# Patient Record
Sex: Female | Born: 1970
Health system: Southern US, Community
[De-identification: ages and names within clinical notes are randomized; demographics above are authoritative.]

## PROBLEM LIST (undated history)

## (undated) DIAGNOSIS — C801 Malignant (primary) neoplasm, unspecified: Secondary | ICD-10-CM

## (undated) DIAGNOSIS — D0502 Lobular carcinoma in situ of left breast: Secondary | ICD-10-CM

## (undated) DIAGNOSIS — Z9889 Other specified postprocedural states: Secondary | ICD-10-CM

## (undated) DIAGNOSIS — Z87442 Personal history of urinary calculi: Secondary | ICD-10-CM

## (undated) DIAGNOSIS — T7840XA Allergy, unspecified, initial encounter: Secondary | ICD-10-CM

## (undated) DIAGNOSIS — N200 Calculus of kidney: Secondary | ICD-10-CM

## (undated) DIAGNOSIS — R112 Nausea with vomiting, unspecified: Secondary | ICD-10-CM

## (undated) HISTORY — DX: Allergy, unspecified, initial encounter: T78.40XA

## (undated) HISTORY — PX: ABDOMINAL HYSTERECTOMY: SHX81

## (undated) HISTORY — PX: CERVICAL BIOPSY  W/ LOOP ELECTRODE EXCISION: SUR135

---

## 2000-12-25 ENCOUNTER — Inpatient Hospital Stay (HOSPITAL_COMMUNITY): Admission: AD | Admit: 2000-12-25 | Discharge: 2000-12-28 | Payer: Self-pay | Admitting: Obstetrics

## 2000-12-26 ENCOUNTER — Encounter: Payer: Self-pay | Admitting: Obstetrics and Gynecology

## 2002-04-27 ENCOUNTER — Emergency Department (HOSPITAL_COMMUNITY): Admission: EM | Admit: 2002-04-27 | Discharge: 2002-04-27 | Payer: Self-pay | Admitting: Emergency Medicine

## 2013-01-12 ENCOUNTER — Ambulatory Visit: Payer: Self-pay

## 2013-01-25 ENCOUNTER — Ambulatory Visit: Payer: Self-pay | Attending: Internal Medicine

## 2013-02-23 ENCOUNTER — Ambulatory Visit: Payer: No Typology Code available for payment source | Attending: Internal Medicine | Admitting: Internal Medicine

## 2013-02-23 VITALS — BP 100/62 | HR 85 | Temp 99.3°F | Resp 16 | Ht 58.27 in | Wt 110.0 lb

## 2013-02-23 DIAGNOSIS — Z0000 Encounter for general adult medical examination without abnormal findings: Secondary | ICD-10-CM

## 2013-02-23 DIAGNOSIS — Z09 Encounter for follow-up examination after completed treatment for conditions other than malignant neoplasm: Secondary | ICD-10-CM | POA: Insufficient documentation

## 2013-02-23 DIAGNOSIS — Z719 Counseling, unspecified: Secondary | ICD-10-CM | POA: Insufficient documentation

## 2013-02-23 NOTE — Progress Notes (Signed)
Patient ID: Crystal [name], female   DOB: [birth year], 42 y.o.   MRN: [id number] Patient Demographics  Crystal [name], is a 42 y.o. female  [id number]  [id number]  DOB - [birth year]  Chief Complaint  Patient presents with  . Establish Care        Subjective:   Crystal [name] today is here to establish primary care. Patient has No headache, No chest pain, No abdominal pain - No Nausea, No new weakness tingling or numbness, No Cough - SOB.  Patient has no medical problems, states that she is only here to get the Depo shot for contraception, last was due on September 19 but she missed her appointment  Objective:    Filed Vitals:   02/23/13 1647  BP: 100/62  Pulse: 85  Temp: 99.3 F (37.4 C)  TempSrc: Oral  Resp: 16  Height: 4' 10.27" (1.48 m)  Weight: 110 lb (49.896 kg)  SpO2: 98%     ALLERGIES:  No Known Allergies  PAST MEDICAL HISTORY: History reviewed. No pertinent past medical history.  PAST SURGICAL HISTORY: Past Surgical History  Procedure Laterality Date  . Cesarean section      FAMILY HISTORY: History reviewed. No pertinent family history.  MEDICATIONS AT HOME: Prior to Admission medications   Not on File    REVIEW OF SYSTEMS:  Constitutional:   No   Fevers, chills, fatigue.  HEENT:    No headaches, Sore throat,   Cardio-vascular: No chest pain,  Orthopnea, swelling in lower extremities, anasarca, palpitations  GI:  No abdominal pain, nausea, vomiting, diarrhea  Resp: No shortness of breath,  No coughing up of blood.No cough.No wheezing.  Skin:  no rash or lesions.  GU:  no dysuria, change in color of urine, no urgency or frequency.  No flank pain.  Musculoskeletal: No joint pain or swelling.  No decreased range of motion.  No back pain.  Psych: No change in mood or affect. No depression or anxiety.  No memory loss.   Exam  General appearance :Awake, alert, NAD, Speech Clear. HEENT: Atraumatic and  Normocephalic, PERLA Neck: supple, no JVD. No cervical lymphadenopathy.  Chest: clear to auscultation bilaterally, no wheezing, rales or rhonchi CVS: S1 S2 regular, no murmurs.  Abdomen: soft, NBS, NT, ND, no gaurding, rigidity or rebound. Extremities: No cyanosis, clubbing, B/L Lower Ext shows no edema,  Neurology: Awake alert, and oriented X 3, CN II-XII intact, Non focal Skin:No Rash or lesions Wounds: N/A    Data Review   Basic Metabolic Panel: No results found for this basename: NA, K, CL, CO2, GLUCOSE, BUN, CREATININE, CALCIUM, MG, PHOS,  in the last 168 hours Liver Function Tests: No results found for this basename: AST, ALT, ALKPHOS, BILITOT, PROT, ALBUMIN,  in the last 168 hours  CBC: No results found for this basename: WBC, NEUTROABS, HGB, HCT, MCV, PLT,  in the last 168 hours ------------------------------------------------------------------------------------------------------------------ No results found for this basename: HGBA1C,  in the last 72 hours ------------------------------------------------------------------------------------------------------------------ No results found for this basename: CHOL, HDL, LDLCALC, TRIG, CHOLHDL, LDLDIRECT,  in the last 72 hours ------------------------------------------------------------------------------------------------------------------ No results found for this basename: TSH, T4TOTAL, FREET3, T3FREE, THYROIDAB,  in the last 72 hours ------------------------------------------------------------------------------------------------------------------ No results found for this basename: VITAMINB12, FOLATE, FERRITIN, TIBC, IRON, RETICCTPCT,  in the last 72 hours  Coagulation profile  No results found for this basename: INR, PROTIME,  in the last 168 hours    Assessment & Plan   Active Problems: Contraception - Unfortunately unable to give the  Depo shot today, as staff member is not available. Requested patient to make  appointment next week and call on Monday.   Patient declined flu shot  Follow-up in one week for the Depo injection   Crystal [name] M.D. 02/23/2013, 5:11 PM

## 2013-02-23 NOTE — Progress Notes (Signed)
Pt is here to est care Voices no concerns.  Alert w/no signs of acute distress.

## 2013-03-02 ENCOUNTER — Ambulatory Visit: Payer: No Typology Code available for payment source | Attending: Internal Medicine

## 2013-03-02 VITALS — Temp 97.8°F

## 2013-03-02 DIAGNOSIS — Z0000 Encounter for general adult medical examination without abnormal findings: Secondary | ICD-10-CM

## 2013-03-02 MED ORDER — MEDROXYPROGESTERONE ACETATE 150 MG/ML IM SUSP
150.0000 mg | Freq: Once | INTRAMUSCULAR | Status: AC
  Administered 2013-03-02: 150 mg via INTRAMUSCULAR

## 2013-03-02 NOTE — Progress Notes (Unsigned)
Patient here for depo provera injection POCT pregnancy test- negative

## 2013-05-25 ENCOUNTER — Ambulatory Visit: Payer: No Typology Code available for payment source

## 2013-06-01 ENCOUNTER — Ambulatory Visit: Payer: No Typology Code available for payment source

## 2013-12-10 ENCOUNTER — Ambulatory Visit: Payer: Self-pay

## 2014-03-20 ENCOUNTER — Telehealth: Payer: Self-pay | Admitting: Pediatrics

## 2014-03-20 DIAGNOSIS — Z2089 Contact with and (suspected) exposure to other communicable diseases: Secondary | ICD-10-CM

## 2014-03-20 DIAGNOSIS — Z207 Contact with and (suspected) exposure to pediculosis, acariasis and other infestations: Secondary | ICD-10-CM

## 2014-03-20 MED ORDER — PERMETHRIN 5 % EX CREA: 1.0000 "application " | TOPICAL_CREAM | Freq: Once | CUTANEOUS | Status: DC

## 2014-03-20 NOTE — Telephone Encounter (Signed)
Crystal [name]'s daughter [name]) was seen in clinic today and diagnosed with scabies.  The entire household is being treated with Permethrin cream.  Rx sent to [facility] and [place].

## 2014-10-07 ENCOUNTER — Ambulatory Visit: Payer: Self-pay

## 2015-02-11 ENCOUNTER — Ambulatory Visit: Payer: Self-pay | Attending: Internal Medicine

## 2015-02-28 ENCOUNTER — Ambulatory Visit: Payer: Self-pay | Attending: Internal Medicine | Admitting: Internal Medicine

## 2015-02-28 ENCOUNTER — Encounter: Payer: Self-pay | Admitting: Internal Medicine

## 2015-02-28 VITALS — BP 103/62 | HR 86 | Temp 98.2°F | Resp 16 | Ht <= 58 in | Wt 110.0 lb

## 2015-02-28 DIAGNOSIS — M25559 Pain in unspecified hip: Secondary | ICD-10-CM | POA: Insufficient documentation

## 2015-02-28 DIAGNOSIS — R1031 Right lower quadrant pain: Secondary | ICD-10-CM | POA: Insufficient documentation

## 2015-02-28 DIAGNOSIS — Z87442 Personal history of urinary calculi: Secondary | ICD-10-CM | POA: Insufficient documentation

## 2015-02-28 DIAGNOSIS — R109 Unspecified abdominal pain: Secondary | ICD-10-CM

## 2015-02-28 DIAGNOSIS — H04123 Dry eye syndrome of bilateral lacrimal glands: Secondary | ICD-10-CM | POA: Insufficient documentation

## 2015-02-28 DIAGNOSIS — K5900 Constipation, unspecified: Secondary | ICD-10-CM | POA: Insufficient documentation

## 2015-02-28 LAB — POCT URINALYSIS DIPSTICK
BILIRUBIN UA: NEGATIVE
GLUCOSE UA: NEGATIVE
KETONES UA: NEGATIVE
Leukocytes, UA: NEGATIVE
Nitrite, UA: NEGATIVE
Protein, UA: NEGATIVE
Spec Grav, UA: 1.025
Urobilinogen, UA: 0.2
pH, UA: 6

## 2015-02-28 MED ORDER — NAPROXEN 500 MG PO TABS: 500.0000 mg | ORAL_TABLET | Freq: Two times a day (BID) | ORAL | Status: DC

## 2015-02-28 MED ORDER — POLYETHYLENE GLYCOL 3350 17 GM/SCOOP PO POWD: 17.0000 g | Freq: Every day | ORAL | Status: DC

## 2015-02-28 NOTE — Progress Notes (Signed)
Patient ID: Crystal [name], female   DOB: [birth year], 45 y.o.   MRN: [id number]  CC: side pain  HPI: Crystal [name] is a 44 y.o. female here today for a follow up visit.  Patient has no past medical history. Patient presents today with complaints of right side pain described as a sharp pain that radiates to her lower abdomen. She denies flank pain, dysuria, nausea, vomiting, or menstrual cramps. She reports that she has some constipation and may go several days without having a bowel movement. She reports soft stools although she has constipation. She has not tried anything for pain. She notes that she has had kidney stones in the past but this is not the same pain. Pain not aggravated by anything.  Red eyes in the morning that burn. Eyes are so irritated at times it causes her to pull over from driving. This has been a problem for over 6 months. She has tried using visine allergy eye drops.   No Known Allergies History reviewed. No pertinent past medical history. Current Outpatient Prescriptions on File Prior to Visit  Medication Sig Dispense Refill  . permethrin (ACTICIN) 5 % cream Apply 1 application topically once. (Patient not taking: Reported on 02/28/2015) 60 g 1   No current facility-administered medications on file prior to visit.   History reviewed. No pertinent family history. Social History   Social History  . Marital Status: Widowed    Spouse Name: N/A  . Number of Children: N/A  . Years of Education: N/A   Occupational History  . Not on file.   Social History Main Topics  . Smoking status: Never Smoker   . Smokeless tobacco: Not on file  . Alcohol Use: Yes     Comment: occasionally  . Drug Use: No  . Sexual Activity: Not on file   Other Topics Concern  . Not on file   Social History Narrative    Review of Systems: Other than what is stated in HPI, all other systems are negative.   Objective:   Filed Vitals:   02/28/15 1641  BP: 103/62  Pulse:  86  Temp: 98.2 F (36.8 C)  Resp: 16    Physical Exam  Constitutional: She is oriented to person, place, and time.  Eyes: EOM are normal. Pupils are equal, round, and reactive to light.  Cardiovascular: Normal rate, regular rhythm and normal heart sounds.   Pulmonary/Chest: Effort normal and breath sounds normal.  Abdominal: Soft. Bowel sounds are normal. She exhibits no distension. There is tenderness (right upper/lower quadrant).  No CVA tenderness  Musculoskeletal: She exhibits no edema.  Neurological: She is alert and oriented to person, place, and time.  Skin: Skin is warm and dry.  Psychiatric: She has a normal mood and affect.     No results found for: WBC, HGB, HCT, MCV, PLT No results found for: CREATININE, BUN, NA, K, CL, CO2  No results found for: HGBA1C Lipid Panel  No results found for: CHOL, TRIG, HDL, CHOLHDL, VLDL, LDLCALC     Assessment and plan:   Crystal [name] was seen today for hip pain.  Diagnoses and all orders for this visit:  Flank pain -     POCT urinalysis dipstick -     Begin naproxen (NAPROSYN) 500 MG tablet; Take 1 tablet (500 mg total) by mouth 2 (two) times daily with a meal.  Constipation, unspecified constipation type -     polyethylene glycol powder (GLYCOLAX/MIRALAX) powder; Take 17 g by mouth daily. Explained that  the daily recommended amount of fiber is 25 g, went over high fiber foods, encourage increased water intake, miralax use, and increased physical activity.  Patient given constipation handout.   Dry eyes, bilateral Patient will switch to Systane eye drops and use 2-3 times daily to see if she has any improvement. If no improvement patient may require a optometry visit for further management.  Due to language barrier, an interpreter was present during the history-taking and subsequent discussion (and for part of the physical exam) with this patient.  Return if symptoms worsen or fail to improve.       Lance Bosch,  [facility] [phone] 02/28/2015, 4:52 PM

## 2015-02-28 NOTE — Progress Notes (Signed)
C/C Rit hip pain, radiating to low abdominal area  No burning with urination complaining of constipation  Pain scale #0. Pain worsen at night Stated eyes are red in the morning.

## 2015-03-10 ENCOUNTER — Encounter (HOSPITAL_COMMUNITY): Payer: Self-pay | Admitting: *Deleted

## 2015-03-20 ENCOUNTER — Other Ambulatory Visit: Payer: Self-pay | Admitting: Obstetrics and Gynecology

## 2015-03-20 ENCOUNTER — Ambulatory Visit (HOSPITAL_COMMUNITY)
Admission: RE | Admit: 2015-03-20 | Discharge: 2015-03-20 | Disposition: A | Discharge status: Home / Self Care | Payer: Self-pay | Source: Ambulatory Visit | Attending: Obstetrics and Gynecology | Admitting: Obstetrics and Gynecology

## 2015-03-20 ENCOUNTER — Encounter (HOSPITAL_COMMUNITY): Payer: Self-pay

## 2015-03-20 VITALS — BP 100/58 | Temp 98.1°F | Ht 60.0 in | Wt 110.0 lb

## 2015-03-20 DIAGNOSIS — Z1231 Encounter for screening mammogram for malignant neoplasm of breast: Secondary | ICD-10-CM

## 2015-03-20 DIAGNOSIS — R87613 High grade squamous intraepithelial lesion on cytologic smear of cervix (HGSIL): Secondary | ICD-10-CM

## 2015-03-20 DIAGNOSIS — Z1239 Encounter for other screening for malignant neoplasm of breast: Secondary | ICD-10-CM

## 2015-03-20 HISTORY — DX: Calculus of kidney: N20.0

## 2015-03-20 NOTE — Progress Notes (Signed)
Patient referred to [place] by the [facility] due to needing a colposcopy to follow-up for abnormal Pap smear on 02/26/2015.  Pap Smear:  Pap smear not completed today. Last Pap smear was 02/26/2015 at the [facility] Department and HGSIL.Referred patient to the [facility] for a colposcopy to follow up for abnormal Pap smear. Appointment scheduled for Wednesday, April 02, 2015 at 1415. Per patient has no history of abnormal Pap smears prior to the most recent Pap smear. Pap smear result is scanned in EPIC under media.  Physical exam: Breasts Breasts symmetrical. No skin abnormalities bilateral breasts. No nipple retraction bilateral breasts. No nipple discharge bilateral breasts. No lymphadenopathy. No lumps palpated bilateral breasts. No complaints of pain or tenderness on exam. Referred patient to the [place] for screening mammogram. Appointment scheduled for Tuesday, April 08, 2015 at [place].       Pelvic/Bimanual No Pap smear completed today since last Pap smear was 02/26/2015. Pap smear not indicated per BCCCP guidelines.   Used interpreter Benjamine Sprague.

## 2015-03-20 NOTE — Patient Instructions (Signed)
Educational materials on self breast awareness given. Explained the colposcopy to [name] that is needed for follow-up of abnormal Pap smear on 02/26/2015. Referred patient to the [facility] for a colposcopy to follow up for abnormal Pap smear. Appointment scheduled for Wednesday, April 02, 2015 at 1415. Referred patient to the [facility] for screening mammogram. Appointment scheduled for Tuesday, April 08, 2015 at [place]. Patient aware of appointments and will be there. Let patient know the [facility] will follow up with her within the next couple weeks after appointment with results by letter or phone. Crystal [name] verbalized understanding.  Crystal [name], Arvil Chaco, RN 1:40 PM

## 2015-04-02 ENCOUNTER — Ambulatory Visit (INDEPENDENT_AMBULATORY_CARE_PROVIDER_SITE_OTHER): Payer: Self-pay | Admitting: Obstetrics & Gynecology

## 2015-04-02 ENCOUNTER — Other Ambulatory Visit (HOSPITAL_COMMUNITY)
Admission: RE | Admit: 2015-04-02 | Discharge: 2015-04-02 | Disposition: A | Discharge status: Home / Self Care | Payer: Self-pay | Source: Ambulatory Visit | Attending: Obstetrics & Gynecology | Admitting: Obstetrics & Gynecology

## 2015-04-02 ENCOUNTER — Encounter: Payer: Self-pay | Admitting: Obstetrics & Gynecology

## 2015-04-02 VITALS — BP 134/66 | HR 72 | Temp 98.2°F | Wt 106.1 lb

## 2015-04-02 DIAGNOSIS — Z3202 Encounter for pregnancy test, result negative: Secondary | ICD-10-CM

## 2015-04-02 DIAGNOSIS — Z01812 Encounter for preprocedural laboratory examination: Secondary | ICD-10-CM

## 2015-04-02 DIAGNOSIS — R87613 High grade squamous intraepithelial lesion on cytologic smear of cervix (HGSIL): Secondary | ICD-10-CM

## 2015-04-02 LAB — POCT PREGNANCY, URINE: PREG TEST UR: NEGATIVE

## 2015-04-02 NOTE — Progress Notes (Signed)
   Subjective:    Patient ID: Crystal [name], female    DOB: 1970-10-08, 44 y.o.   MRN: [id number]  HPI 44 yo H lady referred from [place] for colpo due to a HGSIL pap.   Review of Systems     Objective:   Physical Exam  WNWHHFNAD Breathing, conversing (with interpretor), and ambulating normally  UPT negative, consent signed, time out done Cervix prepped with acetic acid. Transformation zone seen in its entirety. Colpo adequate. 3 small blood vessels, 1, 3, and 5 o'clock position but not really abnormal with green filter I removed the one at the 1 o'clock position entirely Silver nitrate yielded hemostasis ECC obtained. She tolerated the procedure well.        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

## 2015-04-03 ENCOUNTER — Ambulatory Visit: Payer: Self-pay | Attending: Internal Medicine | Admitting: Internal Medicine

## 2015-04-03 ENCOUNTER — Encounter: Payer: Self-pay | Admitting: Internal Medicine

## 2015-04-03 VITALS — BP 106/65 | HR 67 | Temp 98.0°F | Resp 16 | Ht 60.0 in | Wt 109.6 lb

## 2015-04-03 DIAGNOSIS — R319 Hematuria, unspecified: Secondary | ICD-10-CM

## 2015-04-03 DIAGNOSIS — M545 Low back pain, unspecified: Secondary | ICD-10-CM

## 2015-04-03 LAB — POCT URINALYSIS DIPSTICK
BILIRUBIN UA: NEGATIVE
GLUCOSE UA: NEGATIVE
Ketones, UA: NEGATIVE
LEUKOCYTES UA: NEGATIVE
NITRITE UA: NEGATIVE
Protein, UA: NEGATIVE
Spec Grav, UA: 1.01
Urobilinogen, UA: 0.2
pH, UA: 6

## 2015-04-03 MED ORDER — TRAMADOL HCL 50 MG PO TABS: 50.0000 mg | ORAL_TABLET | Freq: Three times a day (TID) | ORAL | Status: DC | PRN

## 2015-04-03 NOTE — Progress Notes (Signed)
   Subjective:    Patient ID: Crystal [name], female    DOB: 09/28/1970, 44 y.o.   MRN: [id number]  Back Pain This is a recurrent problem. The current episode started more than 1 month ago. The problem occurs 2 to 4 times per day. The problem is unchanged. The pain is present in the lumbar spine. The quality of the pain is described as stabbing. The pain radiates to the right thigh. The pain is moderate. The pain is the same all the time. Associated symptoms include abdominal pain. Pertinent negatives include no bladder incontinence, bowel incontinence, dysuria, numbness, pelvic pain or weakness. (Daily soft bowel movements) Risk factors include sedentary lifestyle. She has tried NSAIDs for the symptoms. The treatment provided no relief.    Review of Systems  Gastrointestinal: Positive for abdominal pain. Negative for bowel incontinence.  Genitourinary: Negative for bladder incontinence, dysuria and pelvic pain.  Musculoskeletal: Positive for back pain.  Neurological: Negative for weakness and numbness.  All other systems reviewed and are negative.     Objective:   Physical Exam  Constitutional: She is oriented to person, place, and time.  Cardiovascular: Normal rate, regular rhythm and normal heart sounds.   Pulmonary/Chest: Effort normal and breath sounds normal. She has no wheezes.  Abdominal: Soft. Bowel sounds are normal. There is no tenderness.  Right flank tenderness  Neurological: She is alert and oriented to person, place, and time.  Skin: Skin is warm and dry.  Psychiatric: She has a normal mood and affect.      Assessment & Plan:  Crystal [name] was seen today for back pain.  Diagnoses and all orders for this visit:  Right-sided low back pain without sciatica -     POCT urinalysis dipstick -     US Renal; Future---r/o kidney stones -     traMADol (ULTRAM) 50 MG tablet; Take 1 tablet (50 mg total) by mouth every 8 (eight) hours as needed.  Hematuria See above. Drink  plenty of fluids  Return if symptoms worsen or fail to improve.  Lance Bosch, NP 04/03/2015 12:06 PM

## 2015-04-03 NOTE — Progress Notes (Signed)
Interpreter line used Crystal [name] ID# [id number] Patient complains of lower right sided back pain that radiates down  Her right leg to her knee Patient was prescribed naproxen but states it is not helping

## 2015-04-04 ENCOUNTER — Other Ambulatory Visit: Payer: Self-pay

## 2015-04-04 ENCOUNTER — Ambulatory Visit (HOSPITAL_BASED_OUTPATIENT_CLINIC_OR_DEPARTMENT_OTHER): Payer: Self-pay

## 2015-04-04 VITALS — BP 102/68 | HR 62 | Temp 98.3°F | Resp 14 | Ht 58.5 in | Wt 107.3 lb

## 2015-04-04 DIAGNOSIS — Z0000 Encounter for general adult medical examination without abnormal findings: Secondary | ICD-10-CM

## 2015-04-04 LAB — LIPID PANEL
CHOLESTEROL: 193 mg/dL (ref 125–200)
HDL: 52 mg/dL (ref >=46)
LDL Cholesterol: 125 mg/dL (ref <130)
Total CHOL/HDL Ratio: 3.7 Ratio (ref <=5.0)
Triglycerides: 78 mg/dL (ref <150)
VLDL: 16 mg/dL (ref <30)

## 2015-04-04 LAB — HEMOGLOBIN A1C
Hgb A1c MFr Bld: 5.5 % (ref <5.7)
MEAN PLASMA GLUCOSE: 111 mg/dL (ref <117)

## 2015-04-04 LAB — GLUCOSE (CC13): Glucose: 87 mg/dl (ref 70–140)

## 2015-04-04 NOTE — Patient Instructions (Signed)
Discussed health assessment with patient.She will be called with results of lab work and we will then discussed any further follow up the patient needs. Patient verbalized understanding. 

## 2015-04-04 NOTE — Progress Notes (Signed)
Patient is a new patient to the [facility] program and is currently a BCCCP patient effective 03/20/2015 with interpreter Crystal [name] .   Clinical Measurements: Patient is 4 ft. 10 1/2 inches, weight 107.3 lbs, BMI 22.1 .   Medical History: Patient has no history of high cholesterol. Patient does not have a history of hypertension or diabetes. Per patient no diagnosed history of coronary heart disease, heart attack, heart failure, stroke/TIA, vascular disease or congenital heart defects.   Blood Pressure, Self-measurement: Patient states has no reason to check Blood pressure.  Nutrition Assessment: Patient stated that eats 1 fruit every day. Patient states she eats 1 servings of vegetables a day. Per patient states does eat 3 or more ounces of whole grains daily. Patient stated doesn't eat two or more servings of fish weekly. Patient states she does not  drink more than 36 ounces or 450 calories of beverages with added sugars weekly. Patient stated she does watch her salt intake. Marland Kitchen  Physical Activity Assessment: Patient stated that cleans 150 minutes of moderate exercise a week and rarely does any vigorous exercise.  Smoking Status: Patient has never smoked and is not exposed to smoke. Though patient smells smoke in her bathroom. We discussed air filters and where to get them..  Quality of Life Assessment: In assessing patient's quality of life she stated that out of the past 30 days that she has felt her health is good all of them. Patient also stated that in the past 30 days that her mental health was not good including stress, depression and problems with emotions for 21 days. Patient did state that out of the past 30 days she felt her physical or mental health had not kept her from doing her usual activities including self-care, work or recreation.   Plan: Lab work will be done today including a lipid panel, blood glucose, and Hgb A1C. Will call lab results when they are finished. Will  discuss risk reduction counseling when call results.

## 2015-04-07 ENCOUNTER — Ambulatory Visit (HOSPITAL_COMMUNITY)
Admission: RE | Admit: 2015-04-07 | Discharge: 2015-04-07 | Disposition: A | Discharge status: Home / Self Care | Payer: Self-pay | Source: Ambulatory Visit | Attending: Internal Medicine | Admitting: Internal Medicine

## 2015-04-07 DIAGNOSIS — R319 Hematuria, unspecified: Secondary | ICD-10-CM | POA: Insufficient documentation

## 2015-04-07 DIAGNOSIS — M545 Low back pain, unspecified: Secondary | ICD-10-CM

## 2015-04-07 DIAGNOSIS — N281 Cyst of kidney, acquired: Secondary | ICD-10-CM | POA: Insufficient documentation

## 2015-04-08 ENCOUNTER — Ambulatory Visit
Admission: RE | Admit: 2015-04-08 | Discharge: 2015-04-08 | Disposition: A | Discharge status: Home / Self Care | Payer: No Typology Code available for payment source | Source: Ambulatory Visit | Attending: Obstetrics and Gynecology | Admitting: Obstetrics and Gynecology

## 2015-04-08 ENCOUNTER — Telehealth: Payer: Self-pay

## 2015-04-08 DIAGNOSIS — Z1231 Encounter for screening mammogram for malignant neoplasm of breast: Secondary | ICD-10-CM

## 2015-04-08 NOTE — Telephone Encounter (Signed)
Called to inform about lab work from 04/04/15. Interpreter, Lavon Paganini informed patient: cholesterol- 193, HDL- 52, LDL- 125, triglycerides - 78, Bld Glucose -87 and HBG-A1C - 5.5. Did risk reduction counseling concerning watching type of fats use and exercise. No follow up needed at this time.

## 2015-04-14 ENCOUNTER — Encounter: Payer: Self-pay | Admitting: Obstetrics and Gynecology

## 2015-04-14 ENCOUNTER — Other Ambulatory Visit: Payer: Self-pay | Admitting: Internal Medicine

## 2015-04-14 ENCOUNTER — Ambulatory Visit (INDEPENDENT_AMBULATORY_CARE_PROVIDER_SITE_OTHER): Payer: Self-pay | Admitting: Obstetrics and Gynecology

## 2015-04-14 VITALS — BP 116/68 | HR 78 | Temp 97.9°F | Wt 109.9 lb

## 2015-04-14 DIAGNOSIS — N2889 Other specified disorders of kidney and ureter: Secondary | ICD-10-CM | POA: Insufficient documentation

## 2015-04-14 DIAGNOSIS — C539 Malignant neoplasm of cervix uteri, unspecified: Secondary | ICD-10-CM | POA: Insufficient documentation

## 2015-04-14 DIAGNOSIS — D069 Carcinoma in situ of cervix, unspecified: Secondary | ICD-10-CM

## 2015-04-14 NOTE — Progress Notes (Signed)
CLINIC ENCOUNTER NOTE  History:  44 y.o. [id number] here today for results f/u.  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   Past Medical History  Diagnosis Date  . Kidney stones     Past Surgical History  Procedure Laterality Date  . Cesarean section      2 previous    The following portions of the patient's history were reviewed and updated as appropriate: allergies, current medications, past family history, past medical history, past social history, past surgical history and problem list.    Review of Systems:  See above; comprehensive review of systems was otherwise negative.  Objective:  Physical Exam BP 116/68 mmHg  Pulse 78  Temp(Src) 97.9 F (36.6 C) (Oral)  Wt 109 lb 14.4 oz (49.85 kg)  LMP 03/11/2015 (Exact Date) CONSTITUTIONAL: Well-developed, well-nourished female in no acute distress.  HENT:  Normocephalic, atraumatic SKIN: Skin is warm and dry.  [place]: Alert  PSYCHIATRIC: Normal mood and affect.   Labs and Imaging US Renal  04/07/2015  CLINICAL DATA:  Hematuria for 1 month.  History of nephrolithiasis. EXAM: RENAL / URINARY TRACT ULTRASOUND COMPLETE COMPARISON:  None. FINDINGS: Right Kidney: Length: 10.3 cm. Normal right renal parenchymal echogenicity. No right hydronephrosis. There is a 1.1 x 1.0 x 1.1 cm renal cyst in the upper right kidney with 4 mm echogenic nodular mural focus. No right renal stones large enough to cause acoustic shadowing. Left Kidney: Length: 10.7 cm. Echogenicity within normal limits. No mass or hydronephrosis visualized. No left renal stones large enough to cause acoustic shadowing. Bladder: Appears normal for degree of bladder distention. IMPRESSION: 1. No hydronephrosis. No renal stones large enough to cause acoustic shadowing. 2. Complex 1.1 cm upper right renal cyst, which is indeterminate. Recommend further evaluation with renal mass protocol MRI or CT of the abdomen with and without intravenous contrast. 3. Normal bladder. Electronically  Signed   By: Ilona Sorrel M.D.   On: 04/07/2015 08:21   Ms Digital Screening Bilateral  04/09/2015  CLINICAL DATA:  Screening. EXAM: DIGITAL SCREENING BILATERAL MAMMOGRAM WITH CAD COMPARISON:  None. ACR Breast Density Category d: The breast tissue is extremely dense, which lowers the sensitivity of mammography. FINDINGS: There are no findings suspicious for malignancy. Images were processed with CAD. IMPRESSION: No mammographic evidence of malignancy. A result letter of this screening mammogram will be mailed directly to the patient. RECOMMENDATION: Screening mammogram in one year. (Code:SM-B-01Y) BI-RADS CATEGORY  1: Negative. Electronically Signed   By: Nolon Nations M.D.   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     Cloe Sockwell B. Jeydi Klingel, [place] for [employer], [place]

## 2015-04-15 ENCOUNTER — Telehealth: Payer: Self-pay

## 2015-04-15 NOTE — Telephone Encounter (Signed)
-----   Message from Lance Bosch, NP sent at 04/14/2015  6:11 PM EST ----- Ultrasound reveals that she has a complex cyst on her right kidney. She will need to have a CT of abdomen to evaluate it further. I will place order please schedule. This could be causing some of the blood in her urine

## 2015-04-15 NOTE — Telephone Encounter (Signed)
Patient is aware of her [place] results CT is scheduled November 29th @9 :45 am at [facility] Patient verbalized understanding through interpreter Angelica

## 2015-04-22 ENCOUNTER — Other Ambulatory Visit: Payer: Self-pay | Admitting: Internal Medicine

## 2015-04-22 ENCOUNTER — Encounter (HOSPITAL_COMMUNITY): Payer: Self-pay

## 2015-04-22 ENCOUNTER — Ambulatory Visit (HOSPITAL_COMMUNITY)
Admission: RE | Admit: 2015-04-22 | Discharge: 2015-04-22 | Disposition: A | Discharge status: Home / Self Care | Payer: No Typology Code available for payment source | Source: Ambulatory Visit | Attending: Internal Medicine | Admitting: Internal Medicine

## 2015-04-22 DIAGNOSIS — N2889 Other specified disorders of kidney and ureter: Secondary | ICD-10-CM

## 2015-04-22 DIAGNOSIS — I862 Pelvic varices: Secondary | ICD-10-CM | POA: Insufficient documentation

## 2015-04-22 MED ORDER — IOHEXOL 300 MG/ML  SOLN
100.0000 mL | Freq: Once | INTRAMUSCULAR | Status: AC | PRN
  Administered 2015-04-22: 100 mL via INTRAVENOUS

## 2015-04-25 ENCOUNTER — Telehealth: Payer: Self-pay

## 2015-04-25 ENCOUNTER — Other Ambulatory Visit: Payer: Self-pay | Admitting: Internal Medicine

## 2015-04-25 DIAGNOSIS — M545 Low back pain, unspecified: Secondary | ICD-10-CM

## 2015-04-25 MED ORDER — TRAMADOL HCL 50 MG PO TABS: 50.0000 mg | ORAL_TABLET | Freq: Two times a day (BID) | ORAL | Status: DC | PRN

## 2015-04-25 NOTE — Telephone Encounter (Signed)
Completed. Script on printer

## 2015-04-25 NOTE — Telephone Encounter (Signed)
Patient requesting a refill on her tramadol Can we refill this 

## 2015-04-25 NOTE — Telephone Encounter (Signed)
-----   Message from Lance Bosch, NP sent at 04/25/2015 11:31 AM EST ----- Renal lesion is found to be a benign cyst. Unsure if this is causing some of her flank pain. We will need to do a repeat ultrasound in 6-12 months. If she is not having in changes in flank pain we will likely wait 12 months

## 2015-04-25 NOTE — Telephone Encounter (Signed)
Interpreter line used Luiz Ochoa ID# [id number] Spoke with patient and she is aware of her results from her recent scan Will monitor the cyst and repeat US in the next 6-12  months

## 2015-05-09 ENCOUNTER — Ambulatory Visit: Payer: No Typology Code available for payment source

## 2015-05-12 ENCOUNTER — Encounter: Payer: Self-pay | Admitting: Obstetrics & Gynecology

## 2015-05-12 ENCOUNTER — Other Ambulatory Visit (HOSPITAL_COMMUNITY)
Admission: RE | Admit: 2015-05-12 | Discharge: 2015-05-12 | Disposition: A | Discharge status: Home / Self Care | Payer: Self-pay | Source: Ambulatory Visit | Attending: Obstetrics & Gynecology | Admitting: Obstetrics & Gynecology

## 2015-05-12 ENCOUNTER — Ambulatory Visit (INDEPENDENT_AMBULATORY_CARE_PROVIDER_SITE_OTHER): Payer: Self-pay | Admitting: Obstetrics & Gynecology

## 2015-05-12 VITALS — BP 107/56 | HR 86 | Temp 98.5°F | Wt 106.8 lb

## 2015-05-12 DIAGNOSIS — D069 Carcinoma in situ of cervix, unspecified: Secondary | ICD-10-CM

## 2015-05-12 LAB — POCT PREGNANCY, URINE: Preg Test, Ur: NEGATIVE

## 2015-05-12 NOTE — Progress Notes (Signed)
   GYNECOLOGY CLINIC PROCEDURE NOTE  Crystal [name] is a 44 y.o. [id number] here for LEEP. Patient is Spanish-speaking only, Spanish interpreter present for this encounter.  No GYN concerns. Pap smear and colposcopy reviewed.    Pap  HGSIL on 02/26/2015 Colpo Biopsy Benign on 04/02/2015 ECC CIN III on 04/02/2015  Risks, benefits, alternatives, and limitations of procedure explained to patient, including pain, bleeding, infection, failure to remove abnormal tissue and failure to cure dysplasia, need for repeat procedures, damage to pelvic organs, cervical incompetence.  Role of HPV,cervical dysplasia and need for close followup was empasized. Informed written consent was obtained. All questions were answered. Time out performed. Urine pregnancy test was negative.  Procedure: The patient was placed in lithotomy position and the bivalved coated speculum was placed in the patient's vagina. A grounding pad placed on the patient. Lugol's solution was applied to the cervix and areas of decreased uptake were noted around the transformation zone.   Local anesthesia was administered via an intracervical block using 10 ml of 2% Lidocaine with epinephrine. The suction was turned on and the Large 1X Fisher Cone Biopsy Excisor on 22 Watts of cutting current was used to excise the area of decreased uptake and excise the entire transformation zone. Excellent hemostasis was achieved using roller ball coagulation set at 50 Watts coagulation current. Monsel's solution was then applied and the speculum was removed from the vagina. Specimens were sent to pathology.  The patient tolerated the procedure well. Post-operative instructions given to patient, including instruction to seek medical attention for persistent bright red bleeding, fever, abdominal/pelvic pain, dysuria, nausea or vomiting. She was also told about the possibility of having copious yellow to black tinged discharge for weeks. She was counseled to avoid  anything in the vagina (sex/douching/tampons) for 3 weeks. She has a 4 week post-operative check to assess wound healing, review results and discuss further management.   Verita Schneiders, MD, [place] Attending Obstetrician & Gynecologist, [place] for [facility]

## 2015-05-12 NOTE — Addendum Note (Signed)
Addended by: Verita Schneiders A on: 05/12/2015 03:52 PM   Modules accepted: Orders

## 2015-05-16 NOTE — Progress Notes (Signed)
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 - ENDOCERVICAL AND DEEP MARGIN INVOLVED.  Appointment made for patient with Dr. Everitt Amber at [facility] Oncology on 06/01/14 at 12:15pm Patient will come in to see Dr. Nehemiah Settle on 05/21/15 for discussion of results prior to this appointment; she was informed of this appointment with the help of a Spanish interpreter.   Verita Schneiders, MD, [place] Attending Obstetrician & Gynecologist, [place] for [facility]

## 2015-05-21 ENCOUNTER — Ambulatory Visit: Payer: Self-pay | Admitting: Family Medicine

## 2015-05-22 ENCOUNTER — Ambulatory Visit (INDEPENDENT_AMBULATORY_CARE_PROVIDER_SITE_OTHER): Payer: Self-pay | Admitting: Family Medicine

## 2015-05-22 DIAGNOSIS — C539 Malignant neoplasm of cervix uteri, unspecified: Secondary | ICD-10-CM

## 2015-05-22 MED ORDER — TRAMADOL HCL 50 MG PO TABS: 50.0000 mg | ORAL_TABLET | Freq: Four times a day (QID) | ORAL | Status: DC

## 2015-05-22 NOTE — Progress Notes (Signed)
   Subjective:    Patient ID: Crystal [name], female    DOB: 15-Feb-1971, 44 y.o.   MRN: [id number]  HPI Patient seen for follow-up of LEEP which was done 4 days ago. Pathology shows invasive carcinoma. She is currently having some coffee-ground discharge as well as her abdominal cramping and nausea.   Review of Systems  Constitutional: Negative for fever, chills and fatigue.  Respiratory: Negative for shortness of breath.   Gastrointestinal: Negative for abdominal pain.  Genitourinary: Positive for vaginal discharge (coffee-ground). Negative for vaginal bleeding and vaginal pain.       Objective:   Physical Exam  Constitutional: She appears well-developed and well-nourished.  HENT:  Head: Normocephalic and atraumatic.  Cardiovascular: Normal rate, regular rhythm and normal heart sounds.   Abdominal: Soft. Bowel sounds are normal. She exhibits no distension and no mass. There is tenderness (mild lower quadrant). There is no rebound and no guarding.      Assessment & Plan:  1. 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 The patient has an appointment with gynecology oncology on 1/9. I discussed that they will be talking to her about having a hysterectomy, including her cervix. - traMADol (ULTRAM) 50 MG tablet; Take 1 tablet (50 mg total) by mouth 4 (four) times daily.  Dispense: 30 tablet; Refill: 0

## 2015-05-29 MED FILL — traMADol HCL 50 MG TABS: 50 | 8 days supply | Qty: 30 | Fill #0

## 2015-06-02 ENCOUNTER — Ambulatory Visit: Payer: Self-pay | Attending: Gynecologic Oncology | Admitting: Gynecologic Oncology

## 2015-06-02 ENCOUNTER — Encounter: Payer: Self-pay | Admitting: Gynecologic Oncology

## 2015-06-02 VITALS — BP 122/59 | HR 81 | Temp 97.9°F | Resp 20 | Ht 58.5 in | Wt 110.1 lb

## 2015-06-02 DIAGNOSIS — C539 Malignant neoplasm of cervix uteri, unspecified: Secondary | ICD-10-CM | POA: Insufficient documentation

## 2015-06-02 NOTE — Patient Instructions (Addendum)
Preparing for your Surgery  Plan for surgery on July 01, 2015 with Dr. Everitt Amber.  You will be scheduled for a robotic assisted radical hysterectomy, bilateral salpingectomy, sentinel lymph node biopsy.  You will have a PET scan on Jan 27 before surgery.    Pre-operative Testing -You will receive a phone call from presurgical testing at [facility] to arrange for a pre-operative testing appointment before your surgery.  This appointment normally occurs one to two weeks before your scheduled surgery.   -Bring your insurance card, copy of an advanced directive if applicable, medication list  -At that visit, you will be asked to sign a consent for a possible blood transfusion in case a transfusion becomes necessary during surgery.  The need for a blood transfusion is rare but having consent is a necessary part of your care.     -You should not be taking blood thinners or aspirin at least ten days prior to surgery unless instructed by your surgeon.  Day Before Surgery at [place] will be asked to take in only clear liquids the day before surgery.  Examples of clear liquids include broths, jello, and clear juices.  Avoid carbonated beverages.  You will be advised to have nothing to eat or drink after midnight the evening before.    Your role in recovery Your role is to become active as soon as directed by your doctor, while still giving yourself time to heal.  Rest when you feel tired. You will be asked to do the following in order to speed your recovery:  - Cough and breathe deeply. This helps toclear and expand your lungs and can prevent pneumonia. You may be given a spirometer to practice deep breathing. A staff member will show you how to use the spirometer. - Do mild physical activity. Walking or moving your legs help your circulation and body functions return to normal. A staff member will help you when you try to walk and will provide you with simple exercises. Do  not try to get up or walk alone the first time. - Actively manage your pain. Managing your pain lets you move in comfort. We will ask you to rate your pain on a scale of zero to 10. It is your responsibility to tell your doctor or nurse where and how much you hurt so your pain can be treated.  Special Considerations -If you are diabetic, you may be placed on insulin after surgery to have closer control over your blood sugars to promote healing and recovery.  This does not mean that you will be discharged on insulin.  If applicable, your oral antidiabetics will be resumed when you are tolerating a solid diet.  -Your final pathology results from surgery should be available by the Friday after surgery and the results will be relayed to you when available.  Transfusin de sangre  (Blood Transfusion) Ardelia Mems transfusin de sangre es un procedimiento en el cual se recibe sangre a travs de una va intravenosa. Puede necesitar una transfusin de sangre por una enfermedad, [place] o lesin. La sangre puede provenir de un donante o puede ser su propia sangre donada previamente. La sangre administrada en una transfusin se compone de diferentes tipos de clulas. Puede recibir lo siguiente:  Glbulos rojos. Estos transportan oxgeno y Optician, dispensing perdida.  Plaquetas. Estas controlan el sangrado.  Plasma. Este ayuda a la coagulacin sangunea. Si tiene hemofilia u otro trastorno de [place], tambin puede recibir otro tipo de hemoderivados. INFORME A  SU MDICO:  Cualquier alergia que tenga.  Todos los [employer], incluidos vitaminas, hierbas, gotas oftlmicas, cremas y medicamentos de venta libre.  Problemas previos que usted o los [employer] de su familia hayan tenido con el uso de anestsicos.  Enfermedades de la sangre que tenga.  Si tiene cirugas previas.  Si tiene [employer].  Cualquier reaccin previa que haya tenido durante una transfusin sangunea.  RIESGOS  Y COMPLICACIONES En general, se trata de un procedimiento seguro. Sin embargo, pueden presentarse problemas, por ejemplo:  Nurse, mental health a algn componente de la sangre donada.  Cristy Hilts. Esta puede ser Ardelia Mems reaccin a los glbulos blancos de la sangre transfundida.  Sobrecarga de hierro. Esto puede suceder por haber recibido muchas transfusiones.  Lesin pulmonar aguda relacionada con la transfusin (LPART). Esta es una reaccin poco frecuente que causa dao pulmonar. La causa no se conoce.Esta lesin puede ocurrir unas horas o varios das despus de la transfusin.  Reacciones hemolticas repentinas (agudas) o lentas. Esto sucede si la sangre no es compatible con las clulas de la transfusin. El sistema de defensa del cuerpo (sistema inmunitario) puede tratar de atacar a las clulas nuevas. Esta complicacin es poco frecuente.  Infeccin. Esto es raro. ANTES DEL PROCEDIMIENTO  Es posible que le realicen un anlisis de sangre para determinar su tipo de [place]. Esto es necesario para saber qu clase de sangre aceptar el cuerpo.  Si planifica someterse a [place], puede donar su propia sangre. Esto es en caso de que necesite una transfusin.  Si tuvo una reaccin alrgica a una transfusin en el pasado, tal vez le administren un medicamento que ayude a evitarla. Tome este medicamento solamente como se lo haya indicado el mdico.  Le controlarn la temperatura, la presin arterial y el pulso antes de la transfusin. PROCEDIMIENTO   Le insertarn una va intravenosa en el brazo o la [place].  La bolsa de sangre donada se conectar a la va intravenosa y Pharmacist, community en la vena.  Le controlarn con frecuencia la temperatura, la presin arterial y el pulso durante de la transfusin. Este control se realiza para detectar signos tempranos de una reaccin a la transfusin.  Si tiene signos o sntomas de Hospital doctor, se suspender la transfusin y tal vez le administren un  medicamento.  Cuando finalice la transfusin, le retirarn la va intravenosa.  Se puede aplicar presin en el lugar donde se coloc la va intravenosa.  Le colocarn una venda (vendaje). Este procedimiento puede variar segn el mdico y el hospital. DESPUS DEL PROCEDIMIENTO  Le controlarn la presin arterial, la temperatura y el pulso peridicamente.   Esta informacin no tiene Marine scientist el consejo del mdico. Asegrese de hacerle al mdico cualquier pregunta que tenga.   Document Released: 05/10/2005 Document Revised: 05/31/2014 Elsevier Interactive Patient Education [employer].

## 2015-06-02 NOTE — Progress Notes (Signed)
Consult Note: Gyn-Onc  Consult was requested by Dr. Harolyn Rutherford for the evaluation of Crystal [name] 44 y.o. female with cervical cancer  CC:  Chief Complaint  Patient presents with  . Cervical Cancer    New Consultation    Assessment/Plan:  Crystal [name]  is a 45 y.o.  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  The evaluation and counseling was conducted with a spanish speaking interpretor. I personally reviewed the images including the CT abdomen from 04/22/15.  I recommend PET/CT to rule out gross metastatic disease. If negative, recommend proceeding with robotic assisted radical (Type II) hysterectomy and bilateral salpingectomy and sentinel lymph node biopsy. Clinically the tumor is small (<2cm). We will schedule this for 6 weeks after her LEEP procedure to facilitate optimal healing and minimize surgical morbidity. I discussed operative risks (including bleeding, infection, damage to adjacent structures, lymphedema, nerve dysfunction to the bladder including urinary retention requiring catheterization, shortness vagina, sexual dysfunction, ureteral or vesicular vaginal fistula). I discussed that these risks are increased with radical parametrial dissections.   HPI:  Crystal [name] is a very pleasant 46 year old G2P2 who is seen in consultation at the request of Dr Harolyn Rutherford for clinical stage IB1 (microscopic) cervical SCC on LEEP.   the patient is asymptomatic. She has a history of her first abnormal Pap smear on 02/26/2015 which was HGSIL. She then underwent colposcopic evaluation of the cervix on 04/02/2015 which was benign in appearance. A cervix from the ectocervix was benign, the endocervical curetting showed CIN-3. As follow-up to this she underwent a LEEP procedure on 05/12/2015. 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 Dimensions of the carcinoma were not included  however there was a focus of desmoplasia suspicious for invasive carcinoma involving the deep margin. In the area of deep margin involvement the conus 5 mm in thickness.  She's had no concerns or issues since her LEEP procedure in December. The patient has no symptoms of intermenstrual bleeding or postcoital bleeding. She is no abnormal discharge. She denies a history of abnormal Pap smears. She reports that her last Pap smear was in approximately 2011 and was normal. The patient is from [place] originally and is Spanish-speaking only. She works as a Engineer, building services.  She has a history of 2 prior cesarean sections but no other abdominal surgeries. She is thin and healthy. She does report a history of nephrolithiasis. She underwent CT of the abdomen on 04/22/2015 as part of workup of her right renal mass seen on ultrasound scan. This demonstrated a small complex cystic lesion in the upper pole of the right kidney which is felt to be subjectively a cyst. There was bilateral renal cortical scarring.   Current Meds:  Outpatient Encounter Prescriptions as of 06/02/2015  Medication Sig  . ibuprofen (ADVIL,MOTRIN) 200 MG tablet Take 200 mg by mouth every 6 (six) hours as needed. Reported on 05/22/2015  . traMADol (ULTRAM) 50 MG tablet Take 1 tablet (50 mg total) by mouth 4 (four) times daily.  . [DISCONTINUED] traMADol (ULTRAM) 50 MG tablet Take 1 tablet (50 mg total) by mouth every 12 (twelve) hours as needed.   No facility-administered encounter medications on file as of 06/02/2015.    Allergy: No Known Allergies  Social Hx:   Social History   Social History  . Marital Status: Widowed    Spouse Name: N/A  . Number of Children: N/A  . Years  of Education: N/A   Occupational History  . Not on file.   Social History Main Topics  . Smoking status: Never Smoker   . Smokeless tobacco: Not on file  . Alcohol Use: No     Comment: occasionally  . Drug Use: No  . Sexual Activity: Yes    Birth Control/  Protection: Injection   Other Topics Concern  . Not on file   Social History Narrative    Past Surgical Hx:  Past Surgical History  Procedure Laterality Date  . Cesarean section      2 previous    Past Medical Hx:  Past Medical History  Diagnosis Date  . Kidney stones     Past Gynecological History:  C/s x 2  Patient's last menstrual period was 05/03/2015 (exact date).  Family Hx:  Family History  Problem Relation Age of Onset  . Hypertension Sister     Review of Systems:  Constitutional  Feels well,    ENT Normal appearing ears and nares bilaterally Skin/Breast  No rash, sores, jaundice, itching, dryness Cardiovascular  No chest pain, shortness of breath, or edema  Pulmonary  No cough or wheeze.  Gastro Intestinal  No nausea, vomitting, or diarrhoea. No bright red blood per rectum, no abdominal pain, change in bowel movement, or constipation.  Genito Urinary  No frequency, urgency, dysuria, see HPI Musculo Skeletal  No myalgia, arthralgia, joint swelling or pain  Neurologic  No weakness, numbness, change in gait,  Psychology  No depression, anxiety, insomnia.   Vitals:  Blood pressure 122/59, pulse 81, temperature 97.9 F (36.6 C), temperature source Oral, resp. rate 20, height 4' 10.5" (1.486 m), weight 110 lb 1.6 oz (49.941 kg), last menstrual period 05/03/2015, SpO2 100 %.  Physical Exam: WD in NAD Neck  Supple NROM, without any enlargements.  Lymph Node Survey No cervical supraclavicular or inguinal adenopathy Cardiovascular  Pulse normal rate, regularity and rhythm. S1 and S2 normal.  Lungs  Clear to auscultation bilateraly, without wheezes/crackles/rhonchi. Good air movement.  Skin  No rash/lesions/breakdown  Psychiatry  Alert and oriented to person, place, and time  Abdomen  Normoactive bowel sounds, abdomen soft, non-tender and thin without evidence of hernia.  Back No CVA tenderness Genito Urinary  Vulva/vagina: Normal external  female genitalia.  No lesions. No discharge or bleeding.  Bladder/urethra:  No lesions or masses, well supported bladder  Vagina: grossly normal and free of lesions  Cervix: Well healed LEEP bed. No gross visible or palpable lesions. Cervix is palpably 3cm. Parametria free of disease that is palpable.  Uterus:  Small, mobile, no parametrial involvement or nodularity.  Adnexa: no palpable masses. Rectal  Good tone, no masses no cul de sac nodularity.  Extremities  No bilateral cyanosis, clubbing or edema.   Donaciano Eva, MD  06/02/2015, 1:25 PM

## 2015-06-12 ENCOUNTER — Ambulatory Visit: Payer: Self-pay | Admitting: Obstetrics & Gynecology

## 2015-06-20 ENCOUNTER — Encounter (HOSPITAL_COMMUNITY)
Admission: RE | Admit: 2015-06-20 | Discharge: 2015-06-20 | Disposition: A | Discharge status: Home / Self Care | Payer: Self-pay | Source: Ambulatory Visit | Attending: Gynecologic Oncology | Admitting: Gynecologic Oncology

## 2015-06-20 DIAGNOSIS — C539 Malignant neoplasm of cervix uteri, unspecified: Secondary | ICD-10-CM | POA: Insufficient documentation

## 2015-06-20 LAB — GLUCOSE, CAPILLARY: Glucose-Capillary: 79 mg/dL (ref 65–99)

## 2015-06-20 MED ORDER — FLUDEOXYGLUCOSE F - 18 (FDG) INJECTION
5.4700 | Freq: Once | INTRAVENOUS | Status: AC | PRN
  Administered 2015-06-20: 5.47 via INTRAVENOUS

## 2015-06-25 NOTE — Patient Instructions (Addendum)
Crystal [name]  06/25/2015   Your procedure is scheduled on: 07-01-15 Tuesday  Report to [facility] Main  Entrance take [facility]  elevators to 3rd floor to  [place] at  [place].  Call this number if you have problems the morning of surgery [phone] Follow Clear liquids day before surgery on 06-30-15 - then nothing past 12 midnight.   CLEAR LIQUID DIET   Foods Allowed                                                                      Coffee and tea, regular and decaf                             Plain Jell-O in any flavor                                             Iced Popsicles                                                                    Cranberry, grape and apple juices Sports drinks like Gatorade Lightly seasoned clear broth or consume(fat free) Sugar, honey syrup   Breakfast                                Lunch                                     Supper Cranberry juice                    Beef broth                            Chicken broth Jell-O                                     Grape juice                           Apple juice Coffee or tea                        Jell-O                                      Popsicle  Coffee or tea                        Coffee or tea  _____________________________________________________________________    Remember: ONLY 1 PERSON MAY GO WITH YOU TO SHORT STAY TO GET  READY MORNING OF YOUR SURGERY.  Do not eat food or drink liquids :After Midnight.     Take these medicines the morning of surgery with A SIP OF WATER: NONE. Tylenol -if need. DO NOT TAKE ANY DIABETIC MEDICATIONS DAY OF YOUR SURGERY                               You may not have any metal on your body including hair pins and              piercings  Do not wear jewelry, make-up, lotions, powders or perfumes, deodorant             Do not wear nail polish.  Do not shave  48 hours prior to  surgery.              Men may shave face and neck.   Do not bring valuables to the hospital. [place].  Contacts, dentures or bridgework may not be worn into surgery.  Leave suitcase in the car. After surgery it may be brought to your room.     Patients discharged the day of surgery will not be allowed to drive home.  Name and phone number of your driver: sister Colena Scheele  Special Instructions: N/A              Please read over the following fact sheets you were given: _____________________________________________________________________             [facility] - Preparing for Surgery Before surgery, you can play an important role.  Because skin is not sterile, your skin needs to be as free of germs as possible.  You can reduce the number of germs on your skin by washing with CHG (chlorahexidine gluconate) soap before surgery.  CHG is an antiseptic cleaner which kills germs and bonds with the skin to continue killing germs even after washing. Please DO NOT use if you have an allergy to CHG or antibacterial soaps.  If your skin becomes reddened/irritated stop using the CHG and inform your nurse when you arrive at Short Stay. Do not shave (including legs and underarms) for at least 48 hours prior to the first CHG shower.  You may shave your face/neck. Please follow these instructions carefully:  1.  Shower with CHG Soap the night before surgery and the  morning of Surgery.  2.  If you choose to wash your hair, wash your hair first as usual with your  normal  shampoo.  3.  After you shampoo, rinse your hair and body thoroughly to remove the  shampoo.                           4.  Use CHG as you would any other liquid soap.  You can apply chg directly  to the skin and wash                       Gently with a scrungie or clean  washcloth.  5.  Apply the CHG Soap to your body ONLY FROM THE NECK DOWN.   Do not use on face/ open                            Wound or open sores. Avoid contact with eyes, ears mouth and genitals (private parts).                       Wash face,  Genitals (private parts) with your normal soap.             6.  Wash thoroughly, paying special attention to the area where your surgery  will be performed.  7.  Thoroughly rinse your body with warm water from the neck down.  8.  DO NOT shower/wash with your normal soap after using and rinsing off  the CHG Soap.                9.  Pat yourself dry with a clean towel.            10.  Wear clean pajamas.            11.  Place clean sheets on your bed the night of your first shower and do not  sleep with pets. Day of Surgery : Do not apply any lotions/deodorants the morning of surgery.  Please wear clean clothes to the hospital/surgery center.  FAILURE TO FOLLOW THESE INSTRUCTIONS MAY RESULT IN THE CANCELLATION OF YOUR SURGERY PATIENT SIGNATURE_________________________________  NURSE SIGNATURE__________________________________  ________________________________________________________________________    CLEAR LIQUID DIET-  All day the day before surgery -NO carbonated beverages.   Foods Allowed                                                                       Coffee and tea, regular and decaf                             Plain Jell-O in any flavor                                              Fruit ices (not with fruit pulp)                                     Iced Popsicles                                   Cranberry, grape and apple juices Sports drinks like Gatorade Lightly seasoned clear broth or consume(fat free) Sugar, honey syrup    _____________________________________________________________________    Incentive Spirometer  An incentive spirometer is a tool that can help keep your lungs clear and active. This tool measures how well you are filling your lungs with each breath. Taking long deep breaths may help reverse or decrease the chance  of developing breathing (pulmonary) problems (  especially infection) following:  A long period of time when you are unable to move or be active. BEFORE THE PROCEDURE   If the spirometer includes an indicator to show your best effort, your nurse or respiratory therapist will set it to a desired goal.  If possible, sit up straight or lean slightly forward. Try not to slouch.  Hold the incentive spirometer in an upright position. INSTRUCTIONS FOR USE   Sit on the edge of your bed if possible, or sit up as far as you can in bed or on a chair.  Hold the incentive spirometer in an upright position.  Breathe out normally.  Place the mouthpiece in your mouth and seal your lips tightly around it.  Breathe in slowly and as deeply as possible, raising the piston or the ball toward the top of the column.  Hold your breath for 3-5 seconds or for as long as possible. Allow the piston or ball to fall to the bottom of the column.  Remove the mouthpiece from your mouth and breathe out normally.  Rest for a few seconds and repeat Steps 1 through 7 at least 10 times every 1-2 hours when you are awake. Take your time and take a few normal breaths between deep breaths.  The spirometer may include an indicator to show your best effort. Use the indicator as a goal to work toward during each repetition.  After each set of 10 deep breaths, practice coughing to be sure your lungs are clear. If you have an incision (the cut made at the time of surgery), support your incision when coughing by placing a pillow or rolled up towels firmly against it. Once you are able to get out of bed, walk around indoors and cough well. You may stop using the incentive spirometer when instructed by your caregiver.  RISKS AND COMPLICATIONS  Take your time so you do not get dizzy or light-headed.  If you are in pain, you may need to take or ask for pain medication before doing incentive spirometry. It is harder to take a deep  breath if you are having pain. AFTER USE  Rest and breathe slowly and easily.  It can be helpful to keep track of a log of your progress. Your caregiver can provide you with a simple table to help with this. If you are using the spirometer at home, follow these instructions: [place] IF:   You are having difficultly using the spirometer.  You have trouble using the spirometer as often as instructed.  Your pain medication is not giving enough relief while using the spirometer.  You develop fever of 100.5 F (38.1 C) or higher. SEEK IMMEDIATE MEDICAL CARE IF:   You cough up bloody sputum that had not been present before.  You develop fever of 102 F (38.9 C) or greater.  You develop worsening pain at or near the incision site. MAKE SURE YOU:   Understand these instructions.  Will watch your condition.  Will get help right away if you are not doing well or get worse. Document Released: 09/20/2006 Document Revised: 08/02/2011 Document Reviewed: 11/21/2006 ExitCare Patient Information 2014 ExitCare, [place].   ________________________________________________________________________  WHAT IS A BLOOD TRANSFUSION? Blood Transfusion Information  A transfusion is the replacement of blood or some of its parts. Blood is made up of multiple cells which provide different functions.  Red blood cells carry oxygen and are used for blood loss replacement.  White blood cells fight against infection.  Platelets  control bleeding.  Plasma helps clot blood.  Other blood products are available for specialized needs, such as hemophilia or other clotting disorders. BEFORE THE TRANSFUSION  Who gives blood for transfusions?   Healthy volunteers who are fully evaluated to make sure their blood is safe. This is blood bank blood. Transfusion therapy is the safest it has ever been in the practice of medicine. Before blood is taken from a donor, a complete history is taken to make sure  that person has no history of diseases nor engages in risky social behavior (examples are intravenous drug use or sexual activity with multiple partners). The donor's travel history is screened to minimize risk of transmitting infections, such as malaria. The donated blood is tested for signs of infectious diseases, such as HIV and hepatitis. The blood is then tested to be sure it is compatible with you in order to minimize the chance of a transfusion reaction. If you or a relative donates blood, this is often done in anticipation of surgery and is not appropriate for emergency situations. It takes many days to process the donated blood. RISKS AND COMPLICATIONS Although transfusion therapy is very safe and saves many lives, the main dangers of transfusion include:   Getting an infectious disease.  Developing a transfusion reaction. This is an allergic reaction to something in the blood you were given. Every precaution is taken to prevent this. The decision to have a blood transfusion has been considered carefully by your caregiver before blood is given. Blood is not given unless the benefits outweigh the risks. AFTER THE TRANSFUSION  Right after receiving a blood transfusion, you will usually feel much better and more energetic. This is especially true if your red blood cells have gotten low (anemic). The transfusion raises the level of the red blood cells which carry oxygen, and this usually causes an energy increase.  The nurse administering the transfusion will monitor you carefully for complications. HOME CARE INSTRUCTIONS  No special instructions are needed after a transfusion. You may find your energy is better. Speak with your caregiver about any limitations on activity for underlying diseases you may have. SEEK MEDICAL CARE IF:   Your condition is not improving after your transfusion.  You develop redness or irritation at the intravenous (IV) site. SEEK IMMEDIATE MEDICAL CARE IF:  Any of  the following symptoms occur over the next 12 hours:  Shaking chills.  You have a temperature by mouth above 102 F (38.9 C), not controlled by medicine.  Chest, back, or muscle pain.  People around you feel you are not acting correctly or are confused.  Shortness of breath or difficulty breathing.  Dizziness and fainting.  You get a rash or develop hives.  You have a decrease in urine output.  Your urine turns a dark color or changes to pink, red, or brown. Any of the following symptoms occur over the next 10 days:  You have a temperature by mouth above 102 F (38.9 C), not controlled by medicine.  Shortness of breath.  Weakness after normal activity.  The white part of the eye turns yellow (jaundice).  You have a decrease in the amount of urine or are urinating less often.  Your urine turns a dark color or changes to pink, red, or brown. Document Released: 05/07/2000 Document Revised: 08/02/2011 Document Reviewed: 12/25/2007 [facility] Patient Information 2014 [place], [place].  _______________________________________________________________________

## 2015-06-26 ENCOUNTER — Encounter (HOSPITAL_COMMUNITY)
Admission: RE | Admit: 2015-06-26 | Discharge: 2015-06-26 | Disposition: A | Discharge status: Home / Self Care | Payer: Self-pay | Source: Ambulatory Visit | Attending: Gynecologic Oncology | Admitting: Gynecologic Oncology

## 2015-06-26 ENCOUNTER — Encounter (HOSPITAL_COMMUNITY): Payer: Self-pay

## 2015-06-26 DIAGNOSIS — Z01812 Encounter for preprocedural laboratory examination: Secondary | ICD-10-CM | POA: Insufficient documentation

## 2015-06-26 DIAGNOSIS — Z0183 Encounter for blood typing: Secondary | ICD-10-CM | POA: Insufficient documentation

## 2015-06-26 DIAGNOSIS — C539 Malignant neoplasm of cervix uteri, unspecified: Secondary | ICD-10-CM | POA: Insufficient documentation

## 2015-06-26 HISTORY — DX: Personal history of urinary calculi: Z87.442

## 2015-06-26 HISTORY — DX: Malignant (primary) neoplasm, unspecified: C80.1

## 2015-06-26 LAB — COMPREHENSIVE METABOLIC PANEL
ALK PHOS: 70 U/L (ref 38–126)
ALT: 17 U/L (ref 14–54)
ANION GAP: 8 (ref 5–15)
AST: 17 U/L (ref 15–41)
Albumin: 4.5 g/dL (ref 3.5–5.0)
BUN: 15 mg/dL (ref 6–20)
CALCIUM: 9.2 mg/dL (ref 8.9–10.3)
CO2: 23 mmol/L (ref 22–32)
Chloride: 106 mmol/L (ref 101–111)
Creatinine, Ser: 0.69 mg/dL (ref 0.44–1.00)
GFR calc non Af Amer: >60 mL/min (ref >60)
Glucose, Bld: 96 mg/dL (ref 65–99)
POTASSIUM: 3.7 mmol/L (ref 3.5–5.1)
SODIUM: 137 mmol/L (ref 135–145)
Total Bilirubin: 0.8 mg/dL (ref 0.3–1.2)
Total Protein: 8.1 g/dL (ref 6.5–8.1)

## 2015-06-26 LAB — URINALYSIS, ROUTINE W REFLEX MICROSCOPIC
BILIRUBIN URINE: NEGATIVE
Glucose, UA: NEGATIVE mg/dL
HGB URINE DIPSTICK: NEGATIVE
Ketones, ur: NEGATIVE mg/dL
Leukocytes, UA: NEGATIVE
Nitrite: NEGATIVE
PH: 7 (ref 5.0–8.0)
Protein, ur: NEGATIVE mg/dL
SPECIFIC GRAVITY, URINE: 1.01 (ref 1.005–1.030)

## 2015-06-26 LAB — CBC WITH DIFFERENTIAL/PLATELET
Basophils Absolute: 0 10*3/uL (ref 0.0–0.1)
Basophils Relative: 0 %
EOS ABS: 0.5 10*3/uL (ref 0.0–0.7)
EOS PCT: 5 %
HCT: 43.9 % (ref 36.0–46.0)
HEMOGLOBIN: 14.8 g/dL (ref 12.0–15.0)
LYMPHS ABS: 2.2 10*3/uL (ref 0.7–4.0)
Lymphocytes Relative: 19 %
MCH: 32.8 pg (ref 26.0–34.0)
MCHC: 33.7 g/dL (ref 30.0–36.0)
MCV: 97.3 fL (ref 78.0–100.0)
MONO ABS: 0.6 10*3/uL (ref 0.1–1.0)
MONOS PCT: 5 %
Neutro Abs: 8.3 10*3/uL — ABNORMAL HIGH (ref 1.7–7.7)
Neutrophils Relative %: 71 %
PLATELETS: 241 10*3/uL (ref 150–400)
RBC: 4.51 MIL/uL (ref 3.87–5.11)
RDW: 12.3 % (ref 11.5–15.5)
WBC: 11.6 10*3/uL — ABNORMAL HIGH (ref 4.0–10.5)

## 2015-06-26 LAB — PREGNANCY, URINE: Preg Test, Ur: NEGATIVE

## 2015-06-26 LAB — ABO/RH: ABO/RH(D): O POS

## 2015-06-26 NOTE — Pre-Procedure Instructions (Signed)
NM PET scan 1-271-7 Epic. Interpereter-Juile Sowell with patient today.

## 2015-06-30 ENCOUNTER — Ambulatory Visit: Payer: Self-pay | Admitting: Internal Medicine

## 2015-07-01 ENCOUNTER — Encounter (HOSPITAL_COMMUNITY)
Admission: RE | Disposition: A | Discharge status: Home / Self Care | Payer: Self-pay | Source: Ambulatory Visit | Attending: Gynecologic Oncology

## 2015-07-01 ENCOUNTER — Encounter (HOSPITAL_COMMUNITY): Payer: Self-pay | Admitting: *Deleted

## 2015-07-01 ENCOUNTER — Ambulatory Visit (HOSPITAL_COMMUNITY): Payer: Self-pay | Admitting: Anesthesiology

## 2015-07-01 ENCOUNTER — Ambulatory Visit (HOSPITAL_COMMUNITY)
Admission: RE | Admit: 2015-07-01 | Discharge: 2015-07-03 | Disposition: A | Discharge status: Home / Self Care | Payer: Self-pay | Source: Ambulatory Visit | Attending: Gynecologic Oncology | Admitting: Gynecologic Oncology

## 2015-07-01 DIAGNOSIS — Z79891 Long term (current) use of opiate analgesic: Secondary | ICD-10-CM | POA: Insufficient documentation

## 2015-07-01 DIAGNOSIS — C530 Malignant neoplasm of endocervix: Secondary | ICD-10-CM

## 2015-07-01 DIAGNOSIS — C539 Malignant neoplasm of cervix uteri, unspecified: Secondary | ICD-10-CM | POA: Insufficient documentation

## 2015-07-01 DIAGNOSIS — Z87442 Personal history of urinary calculi: Secondary | ICD-10-CM | POA: Insufficient documentation

## 2015-07-01 HISTORY — PX: ROBOTIC ASSISTED TOTAL HYSTERECTOMY: SHX6085

## 2015-07-01 LAB — TYPE AND SCREEN
ABO/RH(D): O POS
ANTIBODY SCREEN: NEGATIVE

## 2015-07-01 SURGERY — HYSTERECTOMY, TOTAL, ROBOT-ASSISTED
Anesthesia: General | Site: Abdomen | Laterality: Bilateral

## 2015-07-01 MED ORDER — ESMOLOL HCL 100 MG/10ML IV SOLN
INTRAVENOUS | Status: DC | PRN
  Administered 2015-07-01: 10 mg via INTRAVENOUS

## 2015-07-01 MED ORDER — CEFAZOLIN SODIUM-DEXTROSE 2-3 GM-% IV SOLR
2.0000 g | INTRAVENOUS | Status: AC
  Administered 2015-07-01: 2 g via INTRAVENOUS

## 2015-07-01 MED ORDER — HYDROMORPHONE HCL 2 MG/ML IJ SOLN
INTRAMUSCULAR | Status: AC
Start: 2015-07-01 — End: 2015-07-01
  Filled 2015-07-01: qty 1

## 2015-07-01 MED ORDER — KCL IN DEXTROSE-NACL 20-5-0.45 MEQ/L-%-% IV SOLN
INTRAVENOUS | Status: DC
  Administered 2015-07-01 – 2015-07-02 (×2): via INTRAVENOUS
  Filled 2015-07-01 (×2): qty 1000

## 2015-07-01 MED ORDER — SCOPOLAMINE 1 MG/3DAYS TD PT72
MEDICATED_PATCH | TRANSDERMAL | Status: AC
  Filled 2015-07-01: qty 1

## 2015-07-01 MED ORDER — SUGAMMADEX SODIUM 200 MG/2ML IV SOLN
INTRAVENOUS | Status: AC
  Filled 2015-07-01: qty 2

## 2015-07-01 MED ORDER — ROCURONIUM BROMIDE 100 MG/10ML IV SOLN
INTRAVENOUS | Status: AC
  Filled 2015-07-01: qty 1

## 2015-07-01 MED ORDER — ALBUTEROL SULFATE HFA 108 (90 BASE) MCG/ACT IN AERS
INHALATION_SPRAY | RESPIRATORY_TRACT | Status: AC
  Filled 2015-07-01: qty 6.7

## 2015-07-01 MED ORDER — KETOROLAC TROMETHAMINE 15 MG/ML IJ SOLN
INTRAMUSCULAR | Status: AC
  Filled 2015-07-01: qty 1

## 2015-07-01 MED ORDER — ONDANSETRON HCL 4 MG/2ML IJ SOLN: 4.0000 mg | Freq: Four times a day (QID) | INTRAMUSCULAR | Status: DC | PRN

## 2015-07-01 MED ORDER — PROMETHAZINE HCL 25 MG/ML IJ SOLN: 6.2500 mg | INTRAMUSCULAR | Status: DC | PRN

## 2015-07-01 MED ORDER — LACTATED RINGERS IR SOLN
Status: DC | PRN
  Administered 2015-07-01: 1000 mL

## 2015-07-01 MED ORDER — DEXAMETHASONE SODIUM PHOSPHATE 10 MG/ML IJ SOLN
INTRAMUSCULAR | Status: AC
  Filled 2015-07-01: qty 1

## 2015-07-01 MED ORDER — MIDAZOLAM HCL 2 MG/2ML IJ SOLN
INTRAMUSCULAR | Status: AC
  Filled 2015-07-01: qty 2

## 2015-07-01 MED ORDER — STERILE WATER FOR INJECTION IJ SOLN
INTRAMUSCULAR | Status: AC
  Filled 2015-07-01: qty 10

## 2015-07-01 MED ORDER — SCOPOLAMINE 1 MG/3DAYS TD PT72
MEDICATED_PATCH | TRANSDERMAL | Status: DC | PRN
  Administered 2015-07-01: 1 via TRANSDERMAL

## 2015-07-01 MED ORDER — FENTANYL CITRATE (PF) 250 MCG/5ML IJ SOLN
INTRAMUSCULAR | Status: AC
  Filled 2015-07-01: qty 5

## 2015-07-01 MED ORDER — ALBUTEROL SULFATE HFA 108 (90 BASE) MCG/ACT IN AERS
INHALATION_SPRAY | RESPIRATORY_TRACT | Status: DC | PRN
  Administered 2015-07-01: 4 via RESPIRATORY_TRACT

## 2015-07-01 MED ORDER — PROPOFOL 10 MG/ML IV BOLUS
INTRAVENOUS | Status: DC | PRN
  Administered 2015-07-01: 120 mg via INTRAVENOUS

## 2015-07-01 MED ORDER — ONDANSETRON HCL 4 MG/2ML IJ SOLN
INTRAMUSCULAR | Status: DC | PRN
  Administered 2015-07-01: 4 mg via INTRAVENOUS

## 2015-07-01 MED ORDER — SUGAMMADEX SODIUM 200 MG/2ML IV SOLN
INTRAVENOUS | Status: DC | PRN
  Administered 2015-07-01: 200 mg via INTRAVENOUS

## 2015-07-01 MED ORDER — DEXAMETHASONE SODIUM PHOSPHATE 4 MG/ML IJ SOLN
INTRAMUSCULAR | Status: DC | PRN
  Administered 2015-07-01: 10 mg via INTRAVENOUS

## 2015-07-01 MED ORDER — HYDROMORPHONE HCL 1 MG/ML IJ SOLN
0.2000 mg | INTRAMUSCULAR | Status: DC | PRN
  Administered 2015-07-01 (×2): 0.5 mg via INTRAVENOUS
  Administered 2015-07-01: 0.2 mg via INTRAVENOUS
  Filled 2015-07-01 (×3): qty 1

## 2015-07-01 MED ORDER — CEFAZOLIN SODIUM-DEXTROSE 2-3 GM-% IV SOLR
INTRAVENOUS | Status: AC
  Filled 2015-07-01: qty 50

## 2015-07-01 MED ORDER — LIDOCAINE HCL (CARDIAC) 20 MG/ML IV SOLN
INTRAVENOUS | Status: DC | PRN
  Administered 2015-07-01: 50 mg via INTRAVENOUS

## 2015-07-01 MED ORDER — METOCLOPRAMIDE HCL 5 MG/ML IJ SOLN
INTRAMUSCULAR | Status: AC
  Filled 2015-07-01: qty 2

## 2015-07-01 MED ORDER — FENTANYL CITRATE (PF) 100 MCG/2ML IJ SOLN
INTRAMUSCULAR | Status: DC | PRN
  Administered 2015-07-01 (×3): 50 ug via INTRAVENOUS

## 2015-07-01 MED ORDER — OXYCODONE HCL 5 MG PO TABS
10.0000 mg | ORAL_TABLET | ORAL | Status: DC | PRN
  Administered 2015-07-02 – 2015-07-03 (×3): 10 mg via ORAL
  Filled 2015-07-01 (×3): qty 2

## 2015-07-01 MED ORDER — LACTATED RINGERS IV SOLN
INTRAVENOUS | Status: DC | PRN
  Administered 2015-07-01 (×2): via INTRAVENOUS

## 2015-07-01 MED ORDER — KETOROLAC TROMETHAMINE 30 MG/ML IJ SOLN: 30.0000 mg | Freq: Once | INTRAMUSCULAR | Status: DC

## 2015-07-01 MED ORDER — ESMOLOL HCL 100 MG/10ML IV SOLN
INTRAVENOUS | Status: AC
  Filled 2015-07-01: qty 10

## 2015-07-01 MED ORDER — ENOXAPARIN SODIUM 40 MG/0.4ML ~~LOC~~ SOLN
40.0000 mg | SUBCUTANEOUS | Status: AC
  Administered 2015-07-01: 40 mg via SUBCUTANEOUS
  Filled 2015-07-01: qty 0.4

## 2015-07-01 MED ORDER — ROCURONIUM BROMIDE 100 MG/10ML IV SOLN
INTRAVENOUS | Status: DC | PRN
  Administered 2015-07-01: 20 mg via INTRAVENOUS
  Administered 2015-07-01: 10 mg via INTRAVENOUS
  Administered 2015-07-01: 50 mg via INTRAVENOUS
  Administered 2015-07-01: 10 mg via INTRAVENOUS

## 2015-07-01 MED ORDER — HYDROMORPHONE HCL 1 MG/ML IJ SOLN: 0.2500 mg | INTRAMUSCULAR | Status: DC | PRN

## 2015-07-01 MED ORDER — MIDAZOLAM HCL 5 MG/5ML IJ SOLN
INTRAMUSCULAR | Status: DC | PRN
  Administered 2015-07-01: 2 mg via INTRAVENOUS

## 2015-07-01 MED ORDER — OXYCODONE HCL 5 MG PO TABS: 5.0000 mg | ORAL_TABLET | Freq: Once | ORAL | Status: DC | PRN

## 2015-07-01 MED ORDER — PROPOFOL 10 MG/ML IV BOLUS
INTRAVENOUS | Status: AC
  Filled 2015-07-01: qty 20

## 2015-07-01 MED ORDER — ACETAMINOPHEN 500 MG PO TABS
1000.0000 mg | ORAL_TABLET | Freq: Four times a day (QID) | ORAL | Status: DC
  Administered 2015-07-01 – 2015-07-03 (×7): 1000 mg via ORAL
  Filled 2015-07-01 (×12): qty 2

## 2015-07-01 MED ORDER — ONDANSETRON HCL 4 MG/2ML IJ SOLN
INTRAMUSCULAR | Status: AC
  Filled 2015-07-01: qty 2

## 2015-07-01 MED ORDER — METOCLOPRAMIDE HCL 5 MG/ML IJ SOLN
INTRAMUSCULAR | Status: DC | PRN
  Administered 2015-07-01: 10 mg via INTRAVENOUS

## 2015-07-01 MED ORDER — OXYCODONE HCL 5 MG/5ML PO SOLN
5.0000 mg | Freq: Once | ORAL | Status: DC | PRN
  Filled 2015-07-01: qty 5

## 2015-07-01 MED ORDER — ONDANSETRON HCL 4 MG PO TABS: 4.0000 mg | ORAL_TABLET | Freq: Four times a day (QID) | ORAL | Status: DC | PRN

## 2015-07-01 MED ORDER — KCL IN DEXTROSE-NACL 20-5-0.45 MEQ/L-%-% IV SOLN
INTRAVENOUS | Status: AC
  Filled 2015-07-01: qty 1000

## 2015-07-01 MED ORDER — HYDROMORPHONE HCL 1 MG/ML IJ SOLN
INTRAMUSCULAR | Status: DC | PRN
  Administered 2015-07-01: .4 mg via INTRAVENOUS
  Administered 2015-07-01 (×2): .2 mg via INTRAVENOUS
  Administered 2015-07-01 (×3): .4 mg via INTRAVENOUS

## 2015-07-01 MED ORDER — ENOXAPARIN SODIUM 40 MG/0.4ML ~~LOC~~ SOLN
40.0000 mg | SUBCUTANEOUS | Status: DC
  Administered 2015-07-02 – 2015-07-03 (×2): 40 mg via SUBCUTANEOUS
  Filled 2015-07-01 (×3): qty 0.4

## 2015-07-01 MED ORDER — GABAPENTIN 300 MG PO CAPS
600.0000 mg | ORAL_CAPSULE | Freq: Every day | ORAL | Status: AC
  Administered 2015-07-01: 600 mg via ORAL
  Filled 2015-07-01: qty 2

## 2015-07-01 MED ORDER — KETOROLAC TROMETHAMINE 15 MG/ML IJ SOLN
15.0000 mg | Freq: Four times a day (QID) | INTRAMUSCULAR | Status: DC
  Administered 2015-07-01 – 2015-07-03 (×8): 15 mg via INTRAVENOUS
  Filled 2015-07-01 (×13): qty 1

## 2015-07-01 MED ORDER — LIDOCAINE HCL (CARDIAC) 20 MG/ML IV SOLN
INTRAVENOUS | Status: AC
  Filled 2015-07-01: qty 5

## 2015-07-01 SURGICAL SUPPLY — 52 items
CHLORAPREP W/TINT 26ML (MISCELLANEOUS) ×2 IMPLANT
COVER SURGICAL LIGHT HANDLE (MISCELLANEOUS) ×2 IMPLANT
COVER TIP SHEARS 8 DVNC (MISCELLANEOUS) ×1 IMPLANT
COVER TIP SHEARS 8MM DA VINCI (MISCELLANEOUS) ×1
DRAPE ARM DVNC X/XI (DISPOSABLE) ×8 IMPLANT
DRAPE COLUMN DVNC XI (DISPOSABLE) ×1 IMPLANT
DRAPE DA VINCI XI ARM (DISPOSABLE) ×8
DRAPE DA VINCI XI COLUMN (DISPOSABLE) ×1
DRAPE SHEET LG 3/4 BI-LAMINATE (DRAPES) ×4 IMPLANT
DRAPE SURG IRRIG POUCH 19X23 (DRAPES) ×2 IMPLANT
DRAPE TABLE BACK 44X90 PK DISP (DRAPES) ×2 IMPLANT
DRSG TEGADERM 8X12 (GAUZE/BANDAGES/DRESSINGS) ×2 IMPLANT
ELECT REM PT RETURN 9FT ADLT (ELECTROSURGICAL) ×2
ELECTRODE REM PT RTRN 9FT ADLT (ELECTROSURGICAL) ×1 IMPLANT
GLOVE BIO SURGEON STRL SZ 6 (GLOVE) ×8 IMPLANT
GLOVE BIO SURGEON STRL SZ 6.5 (GLOVE) ×4 IMPLANT
GOWN STRL REUS W/ TWL LRG LVL3 (GOWN DISPOSABLE) ×3 IMPLANT
GOWN STRL REUS W/TWL LRG LVL3 (GOWN DISPOSABLE) ×3
HOLDER FOLEY CATH W/STRAP (MISCELLANEOUS) ×2 IMPLANT
KIT BASIN OR (CUSTOM PROCEDURE TRAY) ×2 IMPLANT
KIT PROCEDURE DA VINCI SI (MISCELLANEOUS) ×1
KIT PROCEDURE DVNC SI (MISCELLANEOUS) ×1 IMPLANT
LIQUID BAND (GAUZE/BANDAGES/DRESSINGS) ×2 IMPLANT
MANIPULATOR UTERINE 4.5 ZUMI (MISCELLANEOUS) ×2 IMPLANT
MARKER SKIN DUAL TIP RULER LAB (MISCELLANEOUS) ×2 IMPLANT
NDL SAFETY ECLIPSE 18X1.5 (NEEDLE) ×1 IMPLANT
NEEDLE HYPO 18GX1.5 SHARP (NEEDLE) ×1
NEEDLE SPNL 18GX3.5 QUINCKE PK (NEEDLE) ×2 IMPLANT
OBTURATOR XI 8MM BLADELESS (TROCAR) ×2 IMPLANT
OCCLUDER COLPOPNEUMO (BALLOONS) ×2 IMPLANT
PAD POSITIONING PINK XL (MISCELLANEOUS) ×2 IMPLANT
POUCH ENDO CATCH II 15MM (MISCELLANEOUS) IMPLANT
POUCH SPECIMEN RETRIEVAL 10MM (ENDOMECHANICALS) IMPLANT
SEAL CANN UNIV 5-8 DVNC XI (MISCELLANEOUS) ×8 IMPLANT
SEAL XI 5MM-8MM UNIVERSAL (MISCELLANEOUS) ×8
SET TRI-LUMEN FLTR TB AIRSEAL (TUBING) ×2 IMPLANT
SET TUBE IRRIG SUCTION NO TIP (IRRIGATION / IRRIGATOR) ×2 IMPLANT
SHEET LAVH (DRAPES) ×2 IMPLANT
SOLUTION ELECTROLUBE (MISCELLANEOUS) ×2 IMPLANT
SUT MNCRL AB 4-0 PS2 18 (SUTURE) ×4 IMPLANT
SUT VIC AB 0 CT1 27 (SUTURE) ×1
SUT VIC AB 0 CT1 27XBRD ANTBC (SUTURE) ×1 IMPLANT
SYR 50ML LL SCALE MARK (SYRINGE) ×2 IMPLANT
SYRINGE 10CC LL (SYRINGE) ×2 IMPLANT
TOWEL OR 17X26 10 PK STRL BLUE (TOWEL DISPOSABLE) ×4 IMPLANT
TOWEL OR NON WOVEN STRL DISP B (DISPOSABLE) ×2 IMPLANT
TRAP SPECIMEN MUCOUS 40CC (MISCELLANEOUS) IMPLANT
TRAY FOLEY W/METER SILVER 14FR (SET/KITS/TRAYS/PACK) ×2 IMPLANT
TRAY LAPAROSCOPIC (CUSTOM PROCEDURE TRAY) ×2 IMPLANT
TROCAR BLADELESS OPT 5 100 (ENDOMECHANICALS) ×2 IMPLANT
TROCAR PORT AIRSEAL 5X120 (TROCAR) IMPLANT
WATER STERILE IRR 1500ML POUR (IV SOLUTION) ×2 IMPLANT

## 2015-07-01 NOTE — Anesthesia Preprocedure Evaluation (Addendum)
Anesthesia Evaluation  Patient identified by MRN, date of birth, ID band Patient awake    Reviewed: Allergy & Precautions, NPO status , Patient's Chart, lab work & pertinent test results  Airway Mallampati: II  TM Distance: >3 FB Neck ROM: Full    Dental  (+) Dental Advisory Given, Teeth Intact   Pulmonary neg pulmonary ROS,    breath sounds clear to auscultation       Cardiovascular negative cardio ROS   Rhythm:Regular Rate:Normal     Neuro/Psych negative neurological ROS     GI/Hepatic negative GI ROS, Neg liver ROS,   Endo/Other  negative endocrine ROS  Renal/GU negative Renal ROS     Musculoskeletal   Abdominal   Peds  Hematology negative hematology ROS (+)   Anesthesia Other Findings   Reproductive/Obstetrics Cervical CA                            Lab Results  Component Value Date   WBC 11.6* 06/26/2015   HGB 14.8 06/26/2015   HCT 43.9 06/26/2015   MCV 97.3 06/26/2015   PLT 241 06/26/2015   Lab Results  Component Value Date   CREATININE 0.69 06/26/2015   BUN 15 06/26/2015   NA 137 06/26/2015   K 3.7 06/26/2015   CL 106 06/26/2015   CO2 23 06/26/2015    Anesthesia Physical Anesthesia Plan  ASA: II  Anesthesia Plan: General   Post-op Pain Management:    Induction: Intravenous  Airway Management Planned: Oral ETT  Additional Equipment:   Intra-op Plan:   Post-operative Plan: Extubation in OR  Informed Consent: I have reviewed the patients History and Physical, chart, labs and discussed the procedure including the risks, benefits and alternatives for the proposed anesthesia with the patient or authorized representative who has indicated his/her understanding and acceptance.   Dental advisory given  Plan Discussed with: CRNA  Anesthesia Plan Comments:         Anesthesia Quick Evaluation

## 2015-07-01 NOTE — H&P (View-Only) (Signed)
Consult Note: Gyn-Onc  Consult was requested by Dr. Harolyn [name] for the evaluation of Crystal [name] 45 y.o. female with cervical cancer  CC:  Chief Complaint  Patient presents with  . Cervical Cancer    New Consultation    Assessment/Plan:  [name]  is a 45 y.o.  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  The evaluation and counseling was conducted with a spanish speaking interpretor. I personally reviewed the images including the CT abdomen from 04/22/15.  I recommend PET/CT to rule out gross metastatic disease. If negative, recommend proceeding with robotic assisted radical (Type II) hysterectomy and bilateral salpingectomy and sentinel lymph node biopsy. Clinically the tumor is small (<2cm). We will schedule this for 6 weeks after her LEEP procedure to facilitate optimal healing and minimize surgical morbidity. I discussed operative risks (including bleeding, infection, damage to adjacent structures, lymphedema, nerve dysfunction to the bladder including urinary retention requiring catheterization, shortness vagina, sexual dysfunction, ureteral or vesicular vaginal fistula). I discussed that these risks are increased with radical parametrial dissections.   HPI:  Ms [name] is a very pleasant 45 year old G2P2 who is seen in consultation at the request of Dr Crystal [name] for clinical stage IB1 (microscopic) cervical SCC on LEEP.   the patient is asymptomatic. She has a history of her first abnormal Pap smear on 02/26/2015 which was HGSIL. She then underwent colposcopic evaluation of the cervix on 04/02/2015 which was benign in appearance. A cervix from the ectocervix was benign, the endocervical curetting showed CIN-3. As follow-up to this she underwent a LEEP procedure on 05/12/2015. 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 Dimensions of the carcinoma were not included  however there was a focus of desmoplasia suspicious for invasive carcinoma involving the deep margin. In the area of deep margin involvement the conus 5 mm in thickness.  She's had no concerns or issues since her LEEP procedure in December. The patient has no symptoms of intermenstrual bleeding or postcoital bleeding. She is no abnormal discharge. She denies a history of abnormal Pap smears. She reports that her last Pap smear was in approximately 2011 and was normal. The patient is from [place] originally and is Spanish-speaking only. She works as a Engineer, building services.  She has a history of 2 prior cesarean sections but no other abdominal surgeries. She is thin and healthy. She does report a history of nephrolithiasis. She underwent CT of the abdomen on 04/22/2015 as part of workup of her right renal mass seen on ultrasound scan. This demonstrated a small complex cystic lesion in the upper pole of the right kidney which is felt to be subjectively a cyst. There was bilateral renal cortical scarring.   Current Meds:  Outpatient Encounter Prescriptions as of 06/02/2015  Medication Sig  . ibuprofen (ADVIL,MOTRIN) 200 MG tablet Take 200 mg by mouth every 6 (six) hours as needed. Reported on 05/22/2015  . traMADol (ULTRAM) 50 MG tablet Take 1 tablet (50 mg total) by mouth 4 (four) times daily.  . [DISCONTINUED] traMADol (ULTRAM) 50 MG tablet Take 1 tablet (50 mg total) by mouth every 12 (twelve) hours as needed.   No facility-administered encounter medications on file as of 06/02/2015.    Allergy: No Known Allergies  Social Hx:   Social History   Social History  . Marital Status: Widowed    Spouse Name: N/A  . Number of Children: N/A  . Years  of Education: N/A   Occupational History  . Not on file.   Social History Main Topics  . Smoking status: Never Smoker   . Smokeless tobacco: Not on file  . Alcohol Use: No     Comment: occasionally  . Drug Use: No  . Sexual Activity: Yes    Birth Control/  Protection: Injection   Other Topics Concern  . Not on file   Social History Narrative    Past Surgical Hx:  Past Surgical History  Procedure Laterality Date  . Cesarean section      2 previous    Past Medical Hx:  Past Medical History  Diagnosis Date  . Kidney stones     Past Gynecological History:  C/s x 2  Patient's last menstrual period was 05/03/2015 (exact date).  Family Hx:  Family History  Problem Relation Age of Onset  . Hypertension Sister     Review of Systems:  Constitutional  Feels well,    ENT Normal appearing ears and nares bilaterally Skin/Breast  No rash, sores, jaundice, itching, dryness Cardiovascular  No chest pain, shortness of breath, or edema  Pulmonary  No cough or wheeze.  Gastro Intestinal  No nausea, vomitting, or diarrhoea. No bright red blood per rectum, no abdominal pain, change in bowel movement, or constipation.  Genito Urinary  No frequency, urgency, dysuria, see HPI Musculo Skeletal  No myalgia, arthralgia, joint swelling or pain  Neurologic  No weakness, numbness, change in gait,  Psychology  No depression, anxiety, insomnia.   Vitals:  Blood pressure 122/59, pulse 81, temperature 97.9 F (36.6 C), temperature source Oral, resp. rate 20, height 4' 10.5" (1.486 m), weight 110 lb 1.6 oz (49.941 kg), last menstrual period 05/03/2015, SpO2 100 %.  Physical Exam: WD in NAD Neck  Supple NROM, without any enlargements.  Lymph Node Survey No cervical supraclavicular or inguinal adenopathy Cardiovascular  Pulse normal rate, regularity and rhythm. S1 and S2 normal.  Lungs  Clear to auscultation bilateraly, without wheezes/crackles/rhonchi. Good air movement.  Skin  No rash/lesions/breakdown  Psychiatry  Alert and oriented to person, place, and time  Abdomen  Normoactive bowel sounds, abdomen soft, non-tender and thin without evidence of hernia.  Back No CVA tenderness Genito Urinary  Vulva/vagina: Normal external  female genitalia.  No lesions. No discharge or bleeding.  Bladder/urethra:  No lesions or masses, well supported bladder  Vagina: grossly normal and free of lesions  Cervix: Well healed LEEP bed. No gross visible or palpable lesions. Cervix is palpably 3cm. Parametria free of disease that is palpable.  Uterus:  Small, mobile, no parametrial involvement or nodularity.  Adnexa: no palpable masses. Rectal  Good tone, no masses no cul de sac nodularity.  Extremities  No bilateral cyanosis, clubbing or edema.   Donaciano Eva, MD  06/02/2015, 1:25 PM

## 2015-07-01 NOTE — Transfer of Care (Signed)
Immediate Anesthesia Transfer of Care Note  Patient: Crystal [name]  Procedure(s) Performed: Procedure(s): XI ROBOTIC ASSISTED RADICAL TYPE II HYSTERECTOMY, BILATERAL SALPINGECTOMY, SENTINAL LYMPH NODE BIOPSY (Bilateral)  Patient Location: PACU  Anesthesia Type:general  Level of Consciousness: Patient easily awoken, sedated, comfortable, cooperative, following commands, responds to stimulation.   Airway & Oxygen Therapy: Patient spontaneously breathing, ventilating well, oxygen via simple oxygen mask.  Post-op Assessment: Report given to PACU RN, vital signs reviewed and stable, moving all extremities.   Post vital signs: Reviewed and stable.  Complications: No apparent anesthesia complications

## 2015-07-01 NOTE — Interval H&P Note (Signed)
History and Physical Interval Note:  07/01/2015 7:25 AM  Crystal [name]  has presented today for surgery, with the diagnosis of cervical cancer  The various methods of treatment have been discussed with the patient and family. After consideration of risks, benefits and other options for treatment, the patient has consented to  Procedure(s): XI ROBOTIC ASSISTED RADICAL TYPE II HYSTERECTOMY, BILATERAL SALPINGECTOMY, SENTINAL LYMPH NODE BIOPSY (Bilateral) as a surgical intervention .  The patient's history has been reviewed, patient examined, no change in status, stable for surgery.  I have reviewed the patient's chart and labs.  Questions were answered to the patient's satisfaction.   I discussed again her increased risks for urinary tract injury and risk for urinary retention and long term catheter use that is inherent with a radical hysterectomy procedure. The patient was not compliant with a preoperative clear liquid diet. We discussed that this increases her risk for damage to internal organs (GI) due to lack of decompression of the GI system which aids in visualization of the surgical field. I offered postponing her surgery, the patient declined this.  Donaciano Eva, MD    Donaciano Eva

## 2015-07-01 NOTE — Progress Notes (Signed)
Lavon Paganini from [facility] at bedside for interpreting.  [id number]

## 2015-07-01 NOTE — Progress Notes (Signed)
Dr. Denman George  Informed that patient has a head cold and that she ate 3 bean tacos at 1400 yesterday

## 2015-07-01 NOTE — Op Note (Signed)
OPERATIVE NOTE 07/01/15  Surgeon: Donaciano Eva   Assistants: Dr Lahoma Crocker (an MD assistant was necessary for tissue manipulation, management of robotic instrumentation, retraction and positioning due to the complexity of the case and hospital policies).   Anesthesia: General endotracheal anesthesia  ASA Class: 2   Pre-operative Diagnosis: stage IB cervical cancer  Post-operative Diagnosis: same  Operation: Robotic-assisted type III radical laparoscopic hysterectomy with bilateral salpingoophorectomy and bilateral pelvic lymphadenectomy  Surgeon: Donaciano Eva  Assistant Surgeon: Lahoma Crocker MD  Anesthesia: GET  Urine Output: 200  Operative Findings:  : grossly normal appearing cervix, well healed cone site.No gross extracervical involvement.  Estimated Blood Loss:  20cc      Total IV Fluids: 500 ml         Specimens: PATHOLOGY        Uterus, cervix, upper vagina, right external iliac SLN, left obturator SLN Complications:  None; patient tolerated the procedure well.         Disposition: PACU - hemodynamically stable.  Procedure Details  The patient was seen in the Holding Room. The risks, benefits, complications, treatment options, and expected outcomes were discussed with the patient.  The patient concurred with the proposed plan, giving informed consent.  The site of surgery properly noted/marked. The patient was identified as Crystal [name] and the procedure verified as a Robotic-assisted radical hysterectomy with bilateral salpingectomy and bilateral pelvic lymphadenectomy. A Time Out was held and the above information confirmed.  After induction of anesthesia, the patient was draped and prepped in the usual sterile manner. Pt was placed in supine position after anesthesia and draped and prepped in the usual sterile manner. The abdominal drape was placed after the CholoraPrep had been allowed to dry for 3 minutes.  Her arms were tucked  to her side with all appropriate precautions.  The chest was secured to the table.  The patient was placed in the semi-lithotomy position in [place].  The perineum was prepped with Betadine.  Foley catheter was placed. A speculum was inserted into the vagina and the cervix was visualized. It was injected at 3 and 9 o'clock with 2cc at each location of 0.5mg /mL ICG. An EEA sizer (large size) was placed vaginally to define the vaginal fornices.  A second time-out was performed.  OG tube placement was confirmed and to suction.    Procedure:  The patient was brought to the operating room where general anesthesia was administered with no complications.  The patient was placed in the dorsal lithotomy position in padded Allen stirrups.  The arms were tucked at the sides with gel pads protecting the elbows and foam protecting the hands. The patient was then prepped.  A Foley was placed to gravity. An EEA sizer was placed in the vaginal fornices.  The patient was then draped in the normal manner.  Next, a 5 mm skin incision was made 1 cm below the subcostal margin in the midclavicular line.  The 5 mm Optiview port and scope was used for direct entry.  Opening pressure was under 10 mm CO2.  The abdomen was insufflated and the findings were noted as above.   At this point and all points during the procedure, the patient's intra-abdominal pressure did not exceed 15 mmHg. Next, a 10 mm skin incision was made at the umbilicus and a right and left port was placed about 10 cm lateral to the robot port on the right and left side.  A fourth arm was placed in  the left lower quadrant 2 cm above and superior and medial to the anterior superior iliac spine.  All ports were placed under direct visualization.  The patient was placed in steep Trendelenburg.  Bowel was away into the upper abdomen.  The robot was docked in the normal manner.  The right retroperitoneum in the pelvis was opened parallel to the IP ligament. The right  paravesical space was developed with monopolar and sharp dissection. It was held open with tension on the median umbilical ligament with the forth arm. The pararectal space was opened with blunt and sharp dissection to mobilize the ureter off of the medial surface of the internal iliac artery. The medial leaf of the broad ligament containing the ureter was held medially (opening the pararectal space) by the assistant's grasper. The right and left peritoneum were opened parallel to the IP ligament to open the retroperitoneal spaces bilaterally. The SLN mapping was performed in bilateral pelvic basins. The para rectal and paravesical spaces were opened up. Lymphatic channels were identified travelling to the following visualized sentinel lymph node's: right external iliac SLN, left obturator SLN. These SLN's were separated from their surrounding lymphatic tissue, removed and sent for permanent pathology.   The radical hysterectomy was begun by first skeletonizing the right uterine artery at its origin from the internal iliac artery by skeletonizing it 360 degrees and defining the parauterine web between the paravesical and pararectal spaces. The right ureter was skeletonized off of its attachments to the broad ligament and mobilized laterally. Using meticulous blunt and sparing monopolar dissection in short controlled bursts, the right ureter was untunnelled from under the right uterine artery. The anterior vessicouterine ligament was developed and the bladder flap was taken down to below the level of the tumor and below the rounded portion of the EEA sizer. This then definied the anterior bladder pillar. The uterine artery was bipolar fulgarated to seal it, then transected. The uterine vein was also sealed and resected. The lateral boundary of the parametrium was taken down with biploar and monopolar dissection to the level of the deep vaginal vein. The uterine vessels were retracted superior and medially over the  ureter on the right. The ureter was untunnelled through to its entry into the bladder with meticulous sharp dissection and short bursts of monopolar energy. The ureter was dissected off of its attachments to the anterior vagina. The anterior bladder pillar was skeletonized and sealed with bipolar energy while the ureter was deflected distally. The posterior bladder pillar was also dissected with monopolar scissors from the upper vagina.  The rectovaginal septum was entered posteriorally with sharp dissection and the rectum was dissected off of the vagina. A window was created in the broad ligament with care to mobilize the ureter laterally. This skeletonized the utero-ovarian ligament which was sealed with bipolar energy and transected, in doing so, the right fallopian tube was separated from the ovary and kept with the uterine sepecimen in its entirity.The right uterosacral ligament was transected with bipolar and monopolar energy 1/2 of the way towards the uterosacral ligament insertion into the sacrum. In doing so meticulous attention was made to identify and wherever possible, spare the hypogastric nerves. The right paravaginal tissues were tubularized around the vagina on the right at the inferior boundary of the dissection.  The left radical hysterectomy was then performed by first skeletonizing the left uterine artery at its origin from the internal iliac artery by skeletonizing it 360 degrees and defining the parauterine web between the paravesical and  pararectal spaces. The left ureter was skeletonized off of its attachments to the broad ligament and mobilized laterally. Using meticulous blunt and sparing monopolar dissection in short controlled bursts, the left ureter was untunnelled from under the left uterine artery. The left anterior vessicouterine ligament was developed and the bladder flap was taken down to below the level of the tumor and below the rounded portion of the EEA sizer. This then  definied the anterior bladder pillar. The uterine artery was bipolar fulgarated to seal it, then transected. The uterine vein was also sealed and resected. The lateral boundary of the parametrium was taken down with biploar and monopolar dissection to the level of the deep vaginal vein. The uterine vessels were retracted superior and medially over the ureter on the right. The ureter was untunnelled through to its entry into the bladder with meticulous sharp dissection and short bursts of monopolar energy. The ureter was dissected off of its attachments to the anterior vagina. The anterior bladder pillar was skeletonized and sealed with bipolar energy while the ureter was deflected distally. The posterior bladder pillar was also dissected with monopolar scissors from the upper vagina.  The rectovaginal septum was entered posteriorally with sharp dissection and the rectum was dissected off of the vagina. A window was created in the broad ligament with care to mobilize the ureter laterally. This skeletonized the left utero-ovarian ligament which was sealed with bipolar energy and transected. The fallopian tube in its entirity was separated from the left ovary and kept attached to the uterus. The left uterosacral ligament was transected with bipolar and monopolar energy 1/2 of the way towards the uterosacral ligament insertion into the sacrum. In doing so meticulous attention was made to identify and wherever possible, spare the hypogastric nerves. The left paravaginal tissues were tubularized around the vagina on the left at the inferior boundary of the dissection.  The colpotomy was made and the uterus, cervix, bilateral ovaries and tubes were amputated and delivered through the vagina.  Pedicles were inspected and excellent hemostasis was achieved.    The colpotomy at the vaginal cuff was closed with two 0-Vicryl on a CT1 needle in a running manner.  Irrigation was used and excellent hemostasis was achieved.   At this point in the procedure was completed.  Robotic instruments were removed under direct visulaization.  The robot was undocked. The 10 mm ports were closed with Vicryl on a UR-5 needle and the fascia was closed with 0 Vicryl on a UR-5 needle.  The skin was closed with 4-0 Vicryl in a subcuticular manner.  Dermabond was applied.  Sponge, lap and needle counts correct x 2.  The patient was taken to the recovery room in stable condition.  The vagina was swabbed with  minimal bleeding noted.   All instrument and needle counts were correct x  3.   The patient was transferred to the recovery room in a stable condition.  Donaciano Eva, MD

## 2015-07-01 NOTE — Anesthesia Postprocedure Evaluation (Signed)
Anesthesia Post Note  Patient: Crystal [name]  Procedure(s) Performed: Procedure(s) (LRB): XI ROBOTIC ASSISTED RADICAL TYPE II HYSTERECTOMY, BILATERAL SALPINGECTOMY, SENTINAL LYMPH NODE BIOPSY (Bilateral)  Patient location during evaluation: PACU Anesthesia Type: General Level of consciousness: awake and alert Pain management: pain level controlled Vital Signs Assessment: post-procedure vital signs reviewed and stable Respiratory status: spontaneous breathing, nonlabored ventilation, respiratory function stable and patient connected to nasal cannula oxygen Cardiovascular status: blood pressure returned to baseline and stable Postop Assessment: no signs of nausea or vomiting Anesthetic complications: no    Last Vitals:  Filed Vitals:   07/01/15 1457 07/01/15 1558  BP: 103/54 115/64  Pulse: 102 81  Temp: 36.8 C 37 C  Resp: 13 15    Last Pain:  Filed Vitals:   07/01/15 1818  PainSc: 3                  Stephon Weathers A

## 2015-07-01 NOTE — Anesthesia Procedure Notes (Signed)
Procedure Name: Intubation Date/Time: 07/01/2015 7:50 AM Performed by: Deliah Boston Pre-anesthesia Checklist: Patient identified, Emergency Drugs available, Suction available and Patient being monitored Patient Re-evaluated:Patient Re-evaluated prior to inductionOxygen Delivery Method: Circle System Utilized Preoxygenation: Pre-oxygenation with 100% oxygen Intubation Type: IV induction Ventilation: Mask ventilation without difficulty Laryngoscope Size: Mac and 3 Grade View: Grade I Tube type: Oral Tube size: 7.0 mm Number of attempts: 1 Airway Equipment and Method: Oral airway Placement Confirmation: ETT inserted through vocal cords under direct vision,  positive ETCO2 and breath sounds checked- equal and bilateral Secured at: 20 cm Tube secured with: Tape Dental Injury: Teeth and Oropharynx as per pre-operative assessment

## 2015-07-02 LAB — BASIC METABOLIC PANEL
ANION GAP: 7 (ref 5–15)
BUN: 7 mg/dL (ref 6–20)
CHLORIDE: 109 mmol/L (ref 101–111)
CO2: 24 mmol/L (ref 22–32)
Calcium: 9 mg/dL (ref 8.9–10.3)
Creatinine, Ser: 0.88 mg/dL (ref 0.44–1.00)
GFR calc Af Amer: >60 mL/min (ref >60)
Glucose, Bld: 148 mg/dL — ABNORMAL HIGH (ref 65–99)
POTASSIUM: 4.1 mmol/L (ref 3.5–5.1)
SODIUM: 140 mmol/L (ref 135–145)

## 2015-07-02 LAB — CBC
HCT: 38.5 % (ref 36.0–46.0)
HEMOGLOBIN: 13 g/dL (ref 12.0–15.0)
MCH: 32.8 pg (ref 26.0–34.0)
MCHC: 33.8 g/dL (ref 30.0–36.0)
MCV: 97.2 fL (ref 78.0–100.0)
PLATELETS: 227 10*3/uL (ref 150–400)
RBC: 3.96 MIL/uL (ref 3.87–5.11)
RDW: 12.2 % (ref 11.5–15.5)
WBC: 11.9 10*3/uL — AB (ref 4.0–10.5)

## 2015-07-02 MED ORDER — OXYCODONE HCL 5 MG PO TABS: 5.0000 mg | ORAL_TABLET | ORAL | Status: DC | PRN

## 2015-07-02 NOTE — Discharge Summary (Signed)
Physician Discharge Summary  Patient ID: Crystal [name] MRN: [id number] DOB/AGE: Oct 01, 1970 45 y.o.  Admit date: 07/01/2015 Discharge date: 07/02/2015  Admission Diagnoses: Malignant neoplasm of cervix [facility])  Discharge Diagnoses:  Principal Problem:   Malignant neoplasm of cervix [facility]) Active Problems:   Cervical cancer [facility])   Discharged Condition:  The patient is in good condition and stable for discharge.    Hospital Course: On 07/01/2015, the patient underwent the following: Procedure(s): XI ROBOTIC ASSISTED RADICAL TYPE II HYSTERECTOMY, BILATERAL SALPINGECTOMY, SENTINAL LYMPH NODE BIOPSY.   The postoperative course was uneventful.  She was supposed to be discharged to home on postoperative day 1 tolerating a regular diet, minimal pain.  Foley was replaced on POD 1 due to failed voiding trial.  Consults: None  Significant Diagnostic Studies: None  Treatments: surgery: see above  Discharge Exam: Blood pressure 107/53, pulse 79, temperature 98.1 F (36.7 C), temperature source Oral, resp. rate 16, height 4' 10.5" (1.486 m), weight 110 lb (49.896 kg), last menstrual period 06/07/2015, SpO2 100 %. General appearance: alert, cooperative and no distress Resp: clear to auscultation bilaterally Cardio: regular rate and rhythm, S1, S2 normal, no murmur, click, rub or gallop GI: soft, non-tender; bowel sounds normal; no masses,  no organomegaly Extremities: extremities normal, atraumatic, no cyanosis or edema Incision/Wound: Lap sites to the abdomen with dermabond without erythema or drainage  Disposition: Home  Discharge Instructions    Call MD for:  difficulty breathing, headache or visual disturbances    Complete by:  As directed      Call MD for:  extreme fatigue    Complete by:  As directed      Call MD for:  hives    Complete by:  As directed      Call MD for:  persistant dizziness or light-headedness    Complete by:  As directed      Call MD for:  persistant  nausea and vomiting    Complete by:  As directed      Call MD for:  redness, tenderness, or signs of infection (pain, swelling, redness, odor or green/yellow discharge around incision site)    Complete by:  As directed      Call MD for:  severe uncontrolled pain    Complete by:  As directed      Call MD for:  temperature >100.4    Complete by:  As directed      Diet - low sodium heart healthy    Complete by:  As directed      Driving Restrictions    Complete by:  As directed   No driving for 1 week.  Do not take narcotics and drive.     Increase activity slowly    Complete by:  As directed      Lifting restrictions    Complete by:  As directed   No lifting greater than 10 lbs.     Sexual Activity Restrictions    Complete by:  As directed   No sexual activity, nothing in the vagina, for 6 weeks.            Medication List    STOP taking these medications        traMADol 50 MG tablet  Commonly known as:  ULTRAM      TAKE these medications        ibuprofen 200 MG tablet  Commonly known as:  ADVIL,MOTRIN  Take 400 mg by mouth every 6 (six) hours as  needed for moderate pain. Reported on 05/22/2015     OVER THE COUNTER MEDICATION  Take 20 mLs by mouth 3 (three) times daily as needed (cough.). [employer]. - [place] Product.     oxyCODONE 5 MG immediate release tablet  Commonly known as:  Oxy IR/ROXICODONE  Take 1-2 tablets (5-10 mg total) by mouth every 4 (four) hours as needed for severe pain.     PROBIOTIC PO  Take 1 tablet by mouth every evening.           Follow-up Information    Follow up with CROSS, MELISSA DEAL, NP.   Specialty:  Gynecologic Oncology   Contact information:   [place] [address] [phone]       Follow up with Donaciano Eva, MD On 07/23/2015.   Specialty:  Obstetrics and Gynecology   Why:  at 2:15pm at the [facility].     Contact information:   [address] [place] [address] [phone]       Greater  than thirty minutes were spend for face to face discharge instructions and discharge orders/summary in EPIC.   Signed: CROSS, MELISSA DEAL 07/02/2015, 10:48 AM   07/02/15 14:50 Update: Patient is meeting discharge criteria including tolerating diet, minimal pain, foley in place.  Due to weakness, fatigue, and mild SOB that was present pre-operatively per the patient, patient does not want to be discharged.  She is concerned about being home by herself with just her young daughter.  Advised she has met discharge criteria, but patient wanting to stay.  Dr. Denman George aware.  Plan for discharge in the am.

## 2015-07-02 NOTE — Discharge Instructions (Signed)
07/02/2015  Return to work: 4-6 weeks if applicable  Activity: 1. Be up and out of the bed during the day.  Take a nap if needed.  You may walk up steps but be careful and use the hand rail.  Stair climbing will tire you more than you think, you may need to stop part way and rest.   2. No lifting or straining for 6 weeks.  3. No driving for 1 week(s).  Do not drive if you are taking narcotic pain medicine.  4. Shower daily.  Use soap and water on your incision and pat dry; don't rub.  No tub baths until cleared by your surgeon.   5. No sexual activity and nothing in the vagina for 6 weeks.  6. You may experience a small amount of clear drainage from your incisions, which is normal.  If the drainage persists or increases, please call the office.  7. Plan to return to the office in one week for foley removal.  Diet: 1. Low sodium Heart Healthy Diet is recommended.  2. It is safe to use a laxative, such as Miralax or Colace, if you have difficulty moving your bowels.   Wound Care: 1. Keep clean and dry.  Shower daily.  Reasons to call the Doctor:  Fever - Oral temperature greater than 100.4 degrees Fahrenheit  Foul-smelling vaginal discharge  Difficulty urinating  Nausea and vomiting  Increased pain at the site of the incision that is unrelieved with pain medicine.  Difficulty breathing with or without chest pain  New calf pain especially if only on one side  Sudden, continuing increased vaginal bleeding with or without clots.   Contacts: For questions or concerns you should contact:  Dr. Everitt Amber at [phone]  Joylene John, NP at [phone]  After Hours: call [phone] and have the GYN Oncologist paged/contacted  [place] (Foley Catheter Care, Adult)  Un catter Foley es un tubo flexible y blando. Este catter se coloca en la vejiga para drenar el pis (orina). Si le dan el alta con este catter colocado, siga las siguientes  instrucciones. CUIDADOS DEL CATTER:  9. Lave sus manos con agua y [place]. 10. Use un jabn suave y agua tibia en un pao limpio.  Limpie la piel donde el tubo entra en su cuerpo.  Limpie hacia afuera del sitio de la sonda.  Nunca limpie hacia el tubo.  Higienice la zona con un movimiento circular.  Quite todo el jabn. Seque el rea con una toalla limpia dando golpecitos. Los Advertising copywriter en su lugar la piel que cubre el extremo del pene (prepucio). 11. Una la bolsa a la pierna con cinta adhesiva o con una correa para la pierna. No estire el tubo. Si [place] una cinta [place], elimine el residuo pegajoso que haya dejado la cinta anterior. 12. Mantenga la bolsa de drenaje por debajo de la cadera. No deje que toque el suelo. [place]. Asegrese de que funciona y drena. Asegrese de que el tubo no se doble, retuerza o acode. 14. No tire del tubo ni trate de sacarlo. CUIDADOS DE LA BOLSA DE DRENAJE  Tendr una bolsa de drenaje grande durante la noche y Ardelia Mems bolsa pequea para la pierna. Puede usar la bolsa para la noche en cualquier momento. Nunca use la bolsa pequea en la noche. Siga las indicaciones que encontrar a continuacin. Vaciado de la bolsa de drenaje  Debe vaciar la bolsa de drenaje cuando  est a  -  completa o al menos 2 a 3 veces al da.  1. Lave sus manos con agua y [place]. 2. Mantenga la bolsa de drenaje por debajo de la cadera. 3. Sostenga la bolsa sucia sobre el inodoro o un recipiente limpio. 4. Sherlon Handing el pico vertedor en la parte inferior de la bolsa. Vace la orina en el inodoro o el recipiente. No deje que el pico vertedor toque nada. 5. Limpie el pico vertedor con una gasa o algodn con alcohol. 6. Cierre Chartered certified accountant. 7. Ardelia Mems la bolsa a la pierna con cinta adhesiva o con una correa para la pierna. 8. Lvese bien las manos. Cambio de la bolsa de drenaje Cambie la bolsa de drenaje una vez al mes o antes si empieza a oler mal o la ve  sucia.  1. Lave sus manos con agua y [place]. 2. Comprima el catter de goma para que la orina no se derrame. 3. Desconecte el tubo del catter del tubo de drenaje en la conexin de la vlvula. No deje que los tubos toquen nada. 4. Limpie el extremo del catter con un hisopo con alcohol. Limpie el extremo de la bolsa de drenaje con otro hisopo con alcohol. 5. Conecte el tubo del catter al tubo de drenaje de la bolsa limpia. 6. Adhiera la nueva bolsa a la pierna con cinta adhesiva o un broche. Evite ajustar mucho la nueva bolsa. 7. Lvese bien las manos. Limpieza de la bolsa de drenaje 1. Lave sus manos con agua y [place]. 2. Stacy Gardner la bolsa con agua tibia jabonosa. 3. Enjuague la bolsa con agua caliente. 4. Llene la bolsa con una solucin de vinagre blanco y agua (1 taza de vinagre y 950 cc de agua caliente [200 cc vinagre en 1 L de agua tibia]). Cierre la bolsa y djela en remojo durante 30 minutos con la solucin. 5. Enjuague la bolsa con agua caliente. 6. Cuelgue la bolsa para que se seque con el pico vertedor abierto y [employer]. 7. Guarde la bolsa limpia (una vez seca) y Nicoletta Ba de plstico limpia. 8. Lvese bien las manos. EVITE INFECCIONES   Lvese bien las manos antes y despus de tocar el tubo.  [place]. Lave la piel donde el tubo entra en el cuerpo.  No tome baos de inmersin. Reemplace las correas mojadas por correas secas, si corresponde.  No use talcos, aerosoles o lociones en el rea genital. Slo use cremas, lociones o ungentos como le indique su mdico.  Las mujeres deben higienizarse de adelante hacia atrs despus de ir al bao.  Beba suficiente lquido para Consulting civil engineer orina clara o de color amarillo plido, excepto si tiene restriccin de lquidos.  No deje que la bolsa de drenaje ni los tubos toquen el suelo.  Use ropa interior de algodn para Contractor rea seca. SOLICITE AYUDA SI:  La orina es turbia o huele mal.  El catter se  obstruye.  No drena orina hacia la bolsa o siente la vejiga llena.  El catter comienza a perder. SOLICITE AYUDA DE INMEDIATO SI:   Tiene dolor, inflamacin (hinchazn), enrojecimiento u observa un lquido de color blanco amarillento (pus) en el sitio donde el tubo entra en el cuerpo.  Siente dolor en el vientre (abdomen), las piernas, la cintura o la vejiga.  Tiene fiebre.  Observa sangre en el catter o la orina es de color rosado o roja.  Tiene malestar estomacal (nuseas), vmitos o tiene escalofros.  El Proofreader. ASEGRESE DE QUE:   Comprende estas instrucciones.  Controlar su enfermedad.  Solicitar ayuda de inmediato si no mejora o si empeora.   Esta informacin no tiene Marine scientist el consejo del mdico. Asegrese de hacerle al mdico cualquier pregunta que tenga.   Document Released: 09/04/2012 Document Revised: 05/31/2014 Elsevier Interactive Patient Education 2016 [employer].  Oxycodone tablets or capsules Qu es este medicamento? La OXICODONA es un analgsico. Clois Comber utiliza para tratar los dolores moderados a severos. Este medicamento puede ser utilizado para otros usos; si tiene alguna pregunta consulte con su proveedor de atencin mdica o con su farmacutico. Qu le debo informar a mi profesional de la salud antes de tomar este medicamento? Necesitan saber si usted presenta alguno de los siguientes problemas o situaciones: enfermedad de [place] tumor cerebral lesin de la cabeza enfermedad cardiaca antecedentes de abuso de alcohol o drogas si bebe alcohol con frecuencia enfermedad renal enfermedad heptica enfermedad pulmonar o respiratoria, como asma trastorno mental enfermedad pancretica convulsiones enfermedad tiroidea una reaccin inusual o alrgica a la oxicodona, codena, hidrocodona, morfina, a otros medicamentos, alimentos, colorantes o conservantes si est embarazada o buscando quedar embarazada si est amamantando a un beb Cmo debo  utilizar este medicamento? Tome este medicamento por va oral con un vaso de agua. Siga las instrucciones de la etiqueta del [place]. Usted puede tomarlo con o sin alimentos. Si el Transport planner, tmelo con alimentos. Tome sus dosis a intervalos regulares. No tome su medicamento con una frecuencia mayor a la indicada. No deje de tomarlo excepto si as lo indica su mdico. Algunas marcas de este medicamento, tal como [place], tienen instrucciones especiales. Pregunte a su mdico o su profesional de la salud si estas instrucciones se aplican a usted: No corte, triture ni [employer]. Trague solamente una tableta a la vez. No mojar, empapar o lamer la tableta antes de tomarla. Hable con su pediatra para informarse acerca del uso de este medicamento en nios. Puede requerir atencin especial. Sobredosis: Pngase en contacto inmediatamente con un centro toxicolgico o una sala de urgencia si usted cree que haya tomado demasiado medicamento. ATENCIN: [employer] es solo para usted. No comparta este medicamento con nadie. Qu sucede si me olvido de una dosis? Si olvida una dosis, tmela lo antes posible. Si es casi la hora de la prxima dosis, tome slo esa dosis. No tome dosis adicionales o dobles. Qu puede interactuar con este medicamento? -alcohol -antihistamnicos -ciertos medicamentos usados para nuseas, como clorpromacina, droperidol -eritromicina -quetoconazol -medicamentos para la depresin, ansiedad o trastornos psicticos -medicamentos para conciliar el sueo -relajantes musculares -naloxona -naltrexona -medicamentos narcticos (opiceos) para el dolor -nilotinib -fenobarbital -fenitona -rifampicina -ritonavir -voriconazol Puede ser que esta lista no menciona todas las posibles interacciones. Informe a su profesional de KB Home	[place] de [employer] productos a base de hierbas, medicamentos de [place] o suplementos nutritivos que est  tomando. Si usted fuma, consume bebidas alcohlicas o si utiliza drogas ilegales, indqueselo tambin a su profesional de KB Home	[place]. Algunas sustancias pueden interactuar con su medicamento. A qu debo estar atento al usar Coca[employer]? Si el dolor no desaparece, si empeora o si experimenta un dolor nuevo o de tipo diferente, consulte a su mdico o a su profesional de KB Home	[place]. Usted puede desarrollar tolerancia al medicamento. La tolerancia significa que necesitar una dosis ms alta para Best boy. Tolerancia es normal y esperada cuando est tomando este medicamento por un largo perodo de [place].  No suspenda el uso de su medicamento repentinamente debido a que puede Engineer, materials reaccin severa. Su cuerpo se acostumbra a Fish farm manager. Esto NO significa que sea adicto. La adiccin es un comportamiento que hace referencia a la obtencin y utilizacin de un medicamento con fines que no son mdicos. Si tiene Social research officer, government, existe una razn mdica para que usted tome un analgsico. Su mdico le indicar la cantidad de medicamento que Tree surgeon. Si su mdico desea que [employer], la dosis ser reducida gradualmente para Research officer, political party secundarios. Puede experimentar somnolencia o mareos al comenzar con el medicamento o al cambiar de dosis. No conduzca ni utilice maquinaria ni haga nada que sea peligroso hasta que sepa cmo le afecta este medicamento. Sintese y pngase de pie lentamente. Hay distintos tipos de medicamentos narcticos (opiceos) para Conservation officer, historic buildings. Si usted toma ms que un tipo a la [employer], podr tener ms [employer] Corporation. Dar a su proveedor de atencin medica una lista de todos los medicamentos que usted [place]. Su mdico le informar la cantidad de medicamento que Aeronautical engineer. No tome ms medicamento que lo indicado. Comunquese con emergencia para ayuda si tiene problemas para respirar. Este medicamento causar estreimiento. Trate de evacuar los intestinos al menos  cada 2  3 das. Si no evacua los intestinos durante 3 das, comunquese con su mdico o con su profesional de KB Home	[place]. Se le podr secar la boca. Tomar agua en abundancia, masticar chicle sin azcar y chupar caramelos duros le ayudarn. Visite a su dentista cada 6 meses. Qu efectos secundarios puedo tener al [employer] este medicamento? Efectos secundarios que debe informar a su mdico o a Barrister's clerk de la salud tan pronto como sea posible: -Chief of Staff como erupcin cutnea, picazn o urticarias, hinchazn de la cara, labios o lengua -problemas respiratorios -confusin -sensacin de desmayos o aturdimiento, cadas -dificultad para orinar o cambios en el volumen de orina -cansancio o debilidad inusual Efectos secundarios que, por lo general, no requieren atencin mdica (debe informarlos a su mdico o a su profesional de la salud si persisten o si son molestos): -estreimiento -boca seca -picazn -nuseas, vmitos -Higher education careers adviser Puede ser que esta lista no menciona todos los posibles efectos secundarios. Comunquese a su mdico por asesoramiento mdico Hum[employer] Inc. Usted puede informar los efectos secundarios a la FDA por telfono al 1-800-FDA-1088. Dnde debo guardar mi medicina? Mantngala fuera del alcance de los nios. Este medicamento puede ser abusado. Mantenga su medicamento en un lugar seguro para protegerlo contra robos. No comparta este medicamento con nadie. Es peligroso vender o Associate Professor y est prohibido por la ley. Gurdelo a [employer], entre 15 y 67 grados C (23 y 66 grados F). Protjalo de Naval architect. Mantenga el envase bien cerrado. Este medicamento puede causar sobredosis accidental y muerte si otros adultos, nios o Copy se lo toman. Tire por el inodoro cualquier medicamento que no haya utilizado para reducir la posibilidad de dao. No utilice este medicamento despus de la fecha de vencimiento. ATENCIN: Este  folleto es un resumen. Puede ser que no cubra toda la posible informacin. Si usted tiene preguntas acerca de esta medicina, consulte con su mdico, su farmacutico o su profesional de Technical sales engineer.    2016, Elsevier/Gold Standard. (2014-11-07 00:00:00)

## 2015-07-02 NOTE — Progress Notes (Addendum)
07/02/15 14:50 Update: Patient is meeting discharge criteria including tolerating diet, minimal pain, foley in place, vitals and labs stable.  Due to weakness, fatigue, and mild SOB that was present pre-operatively per the patient, patient does not want to be discharged.  She is concerned about being home by herself with just her young daughter.  She has met discharge criteria, but patient wanting to stay.  Dr. Denman George aware.  Plan for discharge in the am.  O2 saturation 100% on RA.

## 2015-07-03 MED ORDER — DOCUSATE SODIUM 100 MG PO CAPS: 100.0000 mg | ORAL_CAPSULE | Freq: Two times a day (BID) | ORAL | Status: DC

## 2015-07-03 MED ORDER — POLYETHYLENE GLYCOL 3350 17 G PO PACK: 17.0000 g | PACK | Freq: Every day | ORAL | Status: DC

## 2015-07-03 NOTE — Discharge Summary (Signed)
Physician Discharge Summary  Patient ID: Crystal [name] MRN: [id number] DOB/AGE: 1970/08/29 45 y.o.  Admit date: 07/01/2015 Discharge date: 07/03/2015  Admission Diagnoses: Malignant neoplasm of cervix [facility])  Discharge Diagnoses:  Principal Problem:   Malignant neoplasm of cervix [facility]) Active Problems:   Cervical cancer [facility])   Discharged Condition: good  Hospital Course: Patient was admitted on 07/01/15 for a robotic assisted radical hysterectomy, bilateral salpingectomy and SLN biopsy. Surgery was uncomplicated. The patient was not discharged on POD 1 due to "feeling weak" despite otherwise meeting discharge criteria. The patient failed a voiding trial (postop urinary retention after radical hysterectomy) and therefore foley was replaced. She will go home with this and have a voiding trial as an outpatient.  Consults: None  Significant Diagnostic Studies: labs:  CBC    Component Value Date/Time   WBC 11.9* 07/02/2015 0512   RBC 3.96 07/02/2015 0512   HGB 13.0 07/02/2015 0512   HCT 38.5 07/02/2015 0512   PLT 227 07/02/2015 0512   MCV 97.2 07/02/2015 0512   MCH 32.8 07/02/2015 0512   MCHC 33.8 07/02/2015 0512   RDW 12.2 07/02/2015 0512   LYMPHSABS 2.2 06/26/2015 1100   MONOABS 0.6 06/26/2015 1100   EOSABS 0.5 06/26/2015 1100   BASOSABS 0.0 06/26/2015 1100      Treatments: surgery: see above  Discharge Exam: Blood pressure 115/48, pulse 81, temperature 98.4 F (36.9 C), temperature source Oral, resp. rate 16, height 4' 10.5" (1.486 m), weight 110 lb (49.896 kg), last menstrual period 06/07/2015, SpO2 99 %. General appearance: alert GI: soft, non-tender; bowel sounds normal; no masses,  no organomegaly Incision/Wound: in tact with skin glue.  Disposition: Home  Discharge Instructions    (HEART FAILURE PATIENTS) Call MD:  Anytime you have any of the following symptoms: 1) 3 pound weight gain in 24 hours or 5 pounds in 1 week 2) shortness of breath, with or  without a dry hacking cough 3) swelling in the hands, feet or stomach 4) if you have to sleep on extra pillows at night in order to breathe.    Complete by:  As directed      Call MD for:  difficulty breathing, headache or visual disturbances    Complete by:  As directed      Call MD for:  difficulty breathing, headache or visual disturbances    Complete by:  As directed      Call MD for:  extreme fatigue    Complete by:  As directed      Call MD for:  extreme fatigue    Complete by:  As directed      Call MD for:  hives    Complete by:  As directed      Call MD for:  hives    Complete by:  As directed      Call MD for:  persistant dizziness or light-headedness    Complete by:  As directed      Call MD for:  persistant dizziness or light-headedness    Complete by:  As directed      Call MD for:  persistant nausea and vomiting    Complete by:  As directed      Call MD for:  persistant nausea and vomiting    Complete by:  As directed      Call MD for:  redness, tenderness, or signs of infection (pain, swelling, redness, odor or green/yellow discharge around incision site)    Complete by:  As  directed      Call MD for:  redness, tenderness, or signs of infection (pain, swelling, redness, odor or green/yellow discharge around incision site)    Complete by:  As directed      Call MD for:  severe uncontrolled pain    Complete by:  As directed      Call MD for:  severe uncontrolled pain    Complete by:  As directed      Call MD for:  temperature >100.4    Complete by:  As directed      Call MD for:  temperature >100.4    Complete by:  As directed      Diet - low sodium heart healthy    Complete by:  As directed      Diet - low sodium heart healthy    Complete by:  As directed      Diet general    Complete by:  As directed      Driving Restrictions    Complete by:  As directed   No driving for 1 week.  Do not take narcotics and drive.     Driving Restrictions    Complete by:  As  directed   No driving for 7 days or until off narcotic pain medication     Increase activity slowly    Complete by:  As directed      Increase activity slowly    Complete by:  As directed      Lifting restrictions    Complete by:  As directed   No lifting greater than 10 lbs.     Remove dressing in 24 hours    Complete by:  As directed      Sexual Activity Restrictions    Complete by:  As directed   No sexual activity, nothing in the vagina, for 6 weeks.     Sexual Activity Restrictions    Complete by:  As directed   No intercourse for 6 weeks            Medication List    STOP taking these medications        traMADol 50 MG tablet  Commonly known as:  ULTRAM      TAKE these medications        docusate sodium 100 MG capsule  Commonly known as:  COLACE  Take 1 capsule (100 mg total) by mouth 2 (two) times daily.     ibuprofen 200 MG tablet  Commonly known as:  ADVIL,MOTRIN  Take 400 mg by mouth every 6 (six) hours as needed for moderate pain. Reported on 05/22/2015     OVER THE COUNTER MEDICATION  Take 20 mLs by mouth 3 (three) times daily as needed (cough.). [place]. - [place] Product.     oxyCODONE 5 MG immediate release tablet  Commonly known as:  Oxy IR/ROXICODONE  Take 1-2 tablets (5-10 mg total) by mouth every 4 (four) hours as needed for severe pain.     PROBIOTIC PO  Take 1 tablet by mouth every evening.           Follow-up Information    Follow up with CROSS, MELISSA DEAL, NP.   Specialty:  Gynecologic Oncology   Contact information:   [place] [address] [phone]       Follow up with Donaciano Eva, MD On 07/23/2015.   Specialty:  Obstetrics and Gynecology   Why:  at 2:15pm at the [facility].  Contact information:   [address] [place] [address] [phone]       Follow up In 1 week.   Why:  foley removal      Signed: Donaciano Eva 07/03/2015, 10:20 AM

## 2015-07-08 ENCOUNTER — Other Ambulatory Visit (HOSPITAL_BASED_OUTPATIENT_CLINIC_OR_DEPARTMENT_OTHER): Payer: Self-pay

## 2015-07-08 ENCOUNTER — Encounter (HOSPITAL_COMMUNITY): Payer: Self-pay | Admitting: *Deleted

## 2015-07-08 ENCOUNTER — Encounter: Payer: Self-pay | Admitting: Gynecologic Oncology

## 2015-07-08 ENCOUNTER — Ambulatory Visit: Payer: Self-pay | Attending: Gynecologic Oncology | Admitting: Gynecologic Oncology

## 2015-07-08 ENCOUNTER — Other Ambulatory Visit: Payer: Self-pay | Admitting: Gynecologic Oncology

## 2015-07-08 ENCOUNTER — Observation Stay (HOSPITAL_COMMUNITY)
Admission: AD | Admit: 2015-07-08 | Discharge: 2015-07-10 | Disposition: A | Discharge status: Home / Self Care | Payer: Self-pay | Source: Ambulatory Visit | Attending: Gynecologic Oncology | Admitting: Gynecologic Oncology

## 2015-07-08 ENCOUNTER — Ambulatory Visit (HOSPITAL_COMMUNITY)
Admission: RE | Admit: 2015-07-08 | Discharge: 2015-07-08 | Disposition: A | Discharge status: Home / Self Care | Payer: Self-pay | Source: Ambulatory Visit | Attending: Gynecologic Oncology | Admitting: Gynecologic Oncology

## 2015-07-08 VITALS — BP 116/74 | HR 122 | Temp 97.8°F | Resp 18

## 2015-07-08 DIAGNOSIS — R0602 Shortness of breath: Secondary | ICD-10-CM | POA: Insufficient documentation

## 2015-07-08 DIAGNOSIS — C539 Malignant neoplasm of cervix uteri, unspecified: Secondary | ICD-10-CM | POA: Insufficient documentation

## 2015-07-08 DIAGNOSIS — M545 Low back pain: Secondary | ICD-10-CM | POA: Insufficient documentation

## 2015-07-08 DIAGNOSIS — N281 Cyst of kidney, acquired: Secondary | ICD-10-CM | POA: Insufficient documentation

## 2015-07-08 DIAGNOSIS — Z79899 Other long term (current) drug therapy: Secondary | ICD-10-CM | POA: Insufficient documentation

## 2015-07-08 DIAGNOSIS — Z9889 Other specified postprocedural states: Secondary | ICD-10-CM | POA: Diagnosis present

## 2015-07-08 DIAGNOSIS — G8918 Other acute postprocedural pain: Secondary | ICD-10-CM

## 2015-07-08 DIAGNOSIS — E860 Dehydration: Secondary | ICD-10-CM | POA: Insufficient documentation

## 2015-07-08 DIAGNOSIS — R112 Nausea with vomiting, unspecified: Secondary | ICD-10-CM

## 2015-07-08 DIAGNOSIS — Z87442 Personal history of urinary calculi: Secondary | ICD-10-CM | POA: Insufficient documentation

## 2015-07-08 DIAGNOSIS — Z9079 Acquired absence of other genital organ(s): Secondary | ICD-10-CM | POA: Insufficient documentation

## 2015-07-08 DIAGNOSIS — Z90722 Acquired absence of ovaries, bilateral: Secondary | ICD-10-CM | POA: Insufficient documentation

## 2015-07-08 DIAGNOSIS — R000 Tachycardia, unspecified: Secondary | ICD-10-CM

## 2015-07-08 DIAGNOSIS — R948 Abnormal results of function studies of other organs and systems: Secondary | ICD-10-CM

## 2015-07-08 DIAGNOSIS — Y846 Urinary catheterization as the cause of abnormal reaction of the patient, or of later complication, without mention of misadventure at the time of the procedure: Secondary | ICD-10-CM | POA: Insufficient documentation

## 2015-07-08 DIAGNOSIS — N390 Urinary tract infection, site not specified: Secondary | ICD-10-CM | POA: Diagnosis present

## 2015-07-08 DIAGNOSIS — R1030 Lower abdominal pain, unspecified: Secondary | ICD-10-CM | POA: Insufficient documentation

## 2015-07-08 DIAGNOSIS — K567 Ileus, unspecified: Secondary | ICD-10-CM

## 2015-07-08 DIAGNOSIS — T83511A Infection and inflammatory reaction due to indwelling urethral catheter, initial encounter: Secondary | ICD-10-CM | POA: Insufficient documentation

## 2015-07-08 DIAGNOSIS — K5900 Constipation, unspecified: Secondary | ICD-10-CM | POA: Insufficient documentation

## 2015-07-08 DIAGNOSIS — Z8541 Personal history of malignant neoplasm of cervix uteri: Secondary | ICD-10-CM | POA: Insufficient documentation

## 2015-07-08 DIAGNOSIS — Z90710 Acquired absence of both cervix and uterus: Secondary | ICD-10-CM | POA: Insufficient documentation

## 2015-07-08 LAB — BASIC METABOLIC PANEL
Anion Gap: 13 mEq/L — ABNORMAL HIGH (ref 3–11)
BUN: 10.8 mg/dL (ref 7.0–26.0)
CHLORIDE: 103 meq/L (ref 98–109)
CO2: 22 mEq/L (ref 22–29)
CREATININE: 0.8 mg/dL (ref 0.6–1.1)
Calcium: 10 mg/dL (ref 8.4–10.4)
EGFR: 84 mL/min/{1.73_m2} — ABNORMAL LOW (ref >90)
GLUCOSE: 98 mg/dL (ref 70–140)
POTASSIUM: 4.3 meq/L (ref 3.5–5.1)
Sodium: 138 mEq/L (ref 136–145)

## 2015-07-08 LAB — CBC WITH DIFFERENTIAL/PLATELET
BASO%: 0.6 % (ref 0.0–2.0)
Basophils Absolute: 0.1 10*3/uL (ref 0.0–0.1)
EOS ABS: 0.2 10*3/uL (ref 0.0–0.5)
EOS%: 2.1 % (ref 0.0–7.0)
HEMATOCRIT: 45.5 % (ref 34.8–46.6)
HGB: 15 g/dL (ref 11.6–15.9)
LYMPH#: 1.1 10*3/uL (ref 0.9–3.3)
LYMPH%: 9.5 % — AB (ref 14.0–49.7)
MCH: 31.7 pg (ref 25.1–34.0)
MCHC: 33 g/dL (ref 31.5–36.0)
MCV: 96.1 fL (ref 79.5–101.0)
MONO#: 0.6 10*3/uL (ref 0.1–0.9)
MONO%: 5.3 % (ref 0.0–14.0)
NEUT%: 82.5 % — AB (ref 38.4–76.8)
NEUTROS ABS: 9.9 10*3/uL — AB (ref 1.5–6.5)
PLATELETS: 326 10*3/uL (ref 145–400)
RBC: 4.73 10*6/uL (ref 3.70–5.45)
RDW: 12.4 % (ref 11.2–14.5)
WBC: 11.9 10*3/uL — AB (ref 3.9–10.3)

## 2015-07-08 LAB — CBC
HEMATOCRIT: 39.6 % (ref 36.0–46.0)
HEMOGLOBIN: 13 g/dL (ref 12.0–15.0)
MCH: 31.7 pg (ref 26.0–34.0)
MCHC: 32.8 g/dL (ref 30.0–36.0)
MCV: 96.6 fL (ref 78.0–100.0)
Platelets: 313 10*3/uL (ref 150–400)
RBC: 4.1 MIL/uL (ref 3.87–5.11)
RDW: 12.3 % (ref 11.5–15.5)
WBC: 9.3 10*3/uL (ref 4.0–10.5)

## 2015-07-08 LAB — CREATININE, SERUM
Creatinine, Ser: 0.63 mg/dL (ref 0.44–1.00)
GFR calc Af Amer: >60 mL/min (ref >60)

## 2015-07-08 MED ORDER — SODIUM CHLORIDE 0.9 % IV SOLN
INTRAVENOUS | Status: DC
  Administered 2015-07-08 – 2015-07-10 (×6): via INTRAVENOUS

## 2015-07-08 MED ORDER — HYDROMORPHONE HCL 1 MG/ML IJ SOLN: 0.2000 mg | INTRAMUSCULAR | Status: DC | PRN

## 2015-07-08 MED ORDER — BISACODYL 10 MG RE SUPP
10.0000 mg | Freq: Every day | RECTAL | Status: DC | PRN
  Administered 2015-07-09: 10 mg via RECTAL
  Filled 2015-07-08: qty 1

## 2015-07-08 MED ORDER — DOCUSATE SODIUM 100 MG PO CAPS
100.0000 mg | ORAL_CAPSULE | Freq: Two times a day (BID) | ORAL | Status: DC
  Administered 2015-07-08 – 2015-07-10 (×4): 100 mg via ORAL
  Filled 2015-07-08 (×4): qty 1

## 2015-07-08 MED ORDER — ALUM & MAG HYDROXIDE-SIMETH 200-200-20 MG/5ML PO SUSP: 30.0000 mL | ORAL | Status: DC | PRN

## 2015-07-08 MED ORDER — IOHEXOL 300 MG/ML  SOLN
100.0000 mL | Freq: Once | INTRAMUSCULAR | Status: AC | PRN
  Administered 2015-07-08: 100 mL via INTRAVENOUS

## 2015-07-08 MED ORDER — ONDANSETRON HCL 8 MG PO TABS: 8.0000 mg | ORAL_TABLET | Freq: Three times a day (TID) | ORAL | Status: DC | PRN

## 2015-07-08 MED ORDER — SODIUM CHLORIDE 0.9 % IV SOLN
8.0000 mg | Freq: Three times a day (TID) | INTRAVENOUS | Status: DC
Start: 2015-07-08 — End: 2015-07-10
  Administered 2015-07-08 – 2015-07-10 (×5): 8 mg via INTRAVENOUS
  Filled 2015-07-08 (×6): qty 4

## 2015-07-08 MED ORDER — GUAIFENESIN 100 MG/5ML PO SOLN: 15.0000 mL | ORAL | Status: DC | PRN

## 2015-07-08 MED ORDER — PRENATAL MULTIVITAMIN CH
1.0000 | ORAL_TABLET | Freq: Every day | ORAL | Status: DC
  Administered 2015-07-09: 1 via ORAL
  Filled 2015-07-08 (×2): qty 1

## 2015-07-08 MED ORDER — IOHEXOL 300 MG/ML  SOLN
50.0000 mL | Freq: Once | INTRAMUSCULAR | Status: AC | PRN
  Administered 2015-07-08: 50 mL via ORAL

## 2015-07-08 MED ORDER — IBUPROFEN 200 MG PO TABS
400.0000 mg | ORAL_TABLET | Freq: Four times a day (QID) | ORAL | Status: DC | PRN
  Administered 2015-07-09: 400 mg via ORAL
  Filled 2015-07-08: qty 2

## 2015-07-08 MED ORDER — OXYCODONE HCL 5 MG PO TABS
5.0000 mg | ORAL_TABLET | ORAL | Status: DC | PRN
  Administered 2015-07-08: 10 mg via ORAL
  Administered 2015-07-09 (×2): 5 mg via ORAL
  Filled 2015-07-08: qty 1
  Filled 2015-07-08: qty 2
  Filled 2015-07-08: qty 1

## 2015-07-08 MED ORDER — OXYCODONE HCL 5 MG PO TABS: 5.0000 mg | ORAL_TABLET | ORAL | Status: DC | PRN

## 2015-07-08 MED ORDER — ENOXAPARIN SODIUM 40 MG/0.4ML ~~LOC~~ SOLN
40.0000 mg | SUBCUTANEOUS | Status: DC
  Administered 2015-07-08: 40 mg via SUBCUTANEOUS
  Filled 2015-07-08 (×2): qty 0.4

## 2015-07-08 NOTE — Patient Instructions (Addendum)
Plan to have lab work today.  We will also schedule you for a CT scan of the chest, abdomen, and pelvis.  We will contact you with the results.  We will wait to remove the catheter until we find out your lab results.    Polyethylene Glycol powder (Take this one capful in the am and one in the pm) Qu es este medicamento? El polvo de POLIETILENGLICOL [id number] en un laxante que se [place] para tratar el estreimiento. Este medicamento aumenta la cantidad de [employer]. Esto hace que las evacuaciones intestinales se produzcan con mayor facilidad y frecuencia. Este medicamento puede ser utilizado para otros usos; si tiene alguna pregunta consulte con su proveedor de atencin mdica o con su farmacutico. Qu le debo informar a mi profesional de la salud antes de tomar este medicamento? Necesita saber si usted presenta alguno de los siguientes problemas o situaciones: -antecedentes de bloqueo estomacal o intestinal -distensin o dolor abdominal actual -dificultad para tragar -diverticulitis, colitis ulcerosa u otra enfermedad intestinal crnica -fenilcetonuria -una reaccin alrgica o inusual al polietilenglicol, a otros medicamentos, colorantes o conservantes -si est embarazada o buscando quedar embarazada -si est amamantando a un beb Cmo debo utilizar este medicamento? Tome este medicamento por va oral. El frasco tiene [place] tapa de medicin marcada con una lnea. Vierta el polvo en la tapa hasta alcanzar la lnea marcada (la dosis equivale aproximadamente a 1 cucharada sopera). Agregue el polvo de la tapa a un vaso lleno (4-8 onzas o 120-240 ml) de agua, jugo, soda, caf o t. Mezcle bien el polvo. Beba la solucin. Tmela exactamente como se indica. No tome su medicamento con una frecuencia mayor que la indicada. Hable con su pediatra para informarse acerca del uso de este medicamento en nios. Puede requerir atencin especial. Sobredosis: Pngase en contacto inmediatamente con un centro  toxicolgico o una sala de urgencia si usted cree que haya tomado demasiado medicamento. ATENCIN: [employer] es solo para usted. No comparta este medicamento con nadie. Qu sucede si me olvido de una dosis? Si olvida una dosis, tmela lo antes posible. Si es casi la hora de la prxima dosis, tome slo esa dosis. No tome dosis adicionales o dobles. Qu puede interactuar con este medicamento? No se esperan interacciones. Puede ser que esta lista no menciona todas las posibles interacciones. Informe a su profesional de [employer]	[place] de [employer] productos a base de hierbas, medicamentos de [place] o suplementos nutritivos que est tomando. Si usted fuma, consume bebidas alcohlicas o si utiliza drogas ilegales, indqueselo tambin a su profesional de [employer]	[place]. Algunas sustancias pueden interactuar con su medicamento. A qu debo estar atento al usar [employer]? No lo utilice durante ms de 2 semanas sin consultar a su mdico o a su profesional de [employer]	[place]. Puede ser necesario que transcurran de 2 a 4 das hasta que se produzca una evacuacin intestinal y se observe una mejora en el estreimiento. Consulte a su profesional de la salud si observa cambios en sus hbitos intestinales, incluyendo el estreimiento, que sean severos o que duren ms de 3 semanas. Tome siempre este medicamento con agua en abundancia. Qu efectos secundarios puedo tener al [employer] este medicamento? Efectos secundarios que debe informar a su mdico o a Barrister's clerk de la salud tan pronto como sea posible: -diarrea -dificultad al respirar -picazn de la piel, urticarias o erupcin cutnea -hinchazn, dolor o distensin de estmago severo -vmito Efectos secundarios que, por lo general, no requieren atencin mdica (debe  informarlos a su mdico o a su profesional de la salud si persisten o si son molestos): -sensacin de llenura o gases -molestias o calambres en la parte baja del abdomen -nuseas Puede ser  que esta lista no menciona todos los posibles efectos secundarios. Comunquese a su mdico por asesoramiento mdico [employer] Usted puede informar los efectos secundarios a la FDA por telfono al 1-800-FDA-1088. Dnde debo guardar mi medicina? Mantngala fuera del alcance de los nios. Gurdela a una temperatura de entre 15 y 69 grados C (2 y 60 grados F). Deseche todo el medicamento que no haya utilizado, despus de la fecha de vencimiento. ATENCIN: Este folleto es un resumen. Puede ser que no cubra toda la posible informacin. Si usted tiene preguntas acerca de esta medicina, consulte con su mdico, su farmacutico o su profesional de Technical sales engineer.    2016, Elsevier/Gold Standard. (2014-07-02 00:00:00)  Docusate capsules (Take one in the morning and one in the evening) Qu es este medicamento? El DOCUSATO es un ablandador fecal. Ayuda a prevenir el estreimiento y los esfuerzos para defecar o las molestias asociadas con las heces [place]. Este medicamento puede ser utilizado para otros usos; si tiene alguna pregunta consulte con su proveedor de atencin mdica o con su farmacutico. Qu le debo informar a mi profesional de la salud antes de tomar este medicamento? Necesita saber si usted presenta alguno de los siguientes problemas o situaciones: -nuseas o vmito -estreimiento severo -dolor de estmago -cambio repentino en el hbito intestinal que dura ms de 2 semanas -una reaccin alrgica o inusual al docusato, otros medicamentos, alimentos, colorantes o conservantes -si est embarazada o buscando quedar embarazada -si est amamantando a un beb Cmo debo utilizar este medicamento? Tome este medicamento por va oral con un vaso de agua. Siga las instrucciones de la etiqueta del [place]. Tome sus dosis a intervalos regulares. No tome su medicamento con una frecuencia mayor a la indicada. Hable con su pediatra para informarse acerca del uso de este  medicamento en nios. Aunque este medicamento ha sido recetado a nios tan menores como de 2 aos de edad para condiciones selectivas, las precauciones se aplican. Sobredosis: Pngase en contacto inmediatamente con un centro toxicolgico o una sala de urgencia si usted cree que haya tomado demasiado medicamento. ATENCIN: [employer] es solo para usted. No comparta este medicamento con nadie. Qu sucede si me olvido de una dosis? Si olvida una dosis, tmela lo antes posible. Si es casi la hora de la prxima dosis, tome slo esa dosis. No tome dosis adicionales o dobles. Qu puede interactuar con este medicamento? -aceite mineral Puede ser que esta lista no menciona todas las posibles interacciones. Informe a su profesional de [employer]	[place] de [employer] productos a base de hierbas, medicamentos de [place] o suplementos nutritivos que est tomando. Si usted fuma, consume bebidas alcohlicas o si utiliza drogas ilegales, indqueselo tambin a su profesional de [employer]	[place]. Algunas sustancias pueden interactuar con su medicamento. A qu debo estar atento al usar [employer]? No lo utilice durante ms de una semana sin Teacher, adult education a su mdico o a su profesional de Technical sales engineer. Consulte a su mdico o a su profesional de la salud si el estreimiento reaparece. Beba agua en abundancia mientras est tomando este medicamento para ayudar a Scientist, research (physical sciences). Hall de usar este medicamento y comunquese con su mdico o su profesional de la salud si experimenta sangrado rectal o no evacua los intestinos despus de usar. stos  pueden ser sntomas de una enfermedad ms grave. Qu efectos secundarios puedo tener al [employer] este medicamento? Efectos secundarios que debe informar a su mdico o a Barrister's clerk de la salud tan pronto como sea posible: -reacciones alrgicas como erupcin cutnea, picazn o urticarias, hinchazn de la cara, labios o lengua Efectos secundarios que, por lo general, no requieren  atencin mdica (debe informarlos a su mdico o a su profesional de la salud si persisten o si son molestos): -diarrea -calambres estomacales -irritacin de la garganta Puede ser que esta lista no menciona todos los posibles efectos secundarios. Comunquese a su mdico por asesoramiento mdico [employer]. Usted puede informar los efectos secundarios a la FDA por telfono al 1-800-FDA-1088. Dnde debo guardar mi medicina? [place]ala fuera del alcance de los nios. Gurdela a [employer], entre 15 y 53 grados C (28 y 49 grados F). Deseche todo el medicamento que no haya utilizado, despus de la fecha de vencimiento. ATENCIN: Este folleto es un resumen. Puede ser que no cubra toda la posible informacin. Si usted tiene preguntas acerca de esta medicina, consulte con su mdico, su farmacutico o su profesional de Technical sales engineer.    2016, Elsevier/Gold Standard. (2014-07-02 00:00:00)

## 2015-07-08 NOTE — H&P (Signed)
Crystal [name] 45 y.o. female  CC:  Chief Complaint  Patient presents with  . 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 She has a history of her first abnormal Pap smear on 02/26/2015 which was HGSIL. She then underwent colposcopic evaluation of the cervix on 04/02/2015 which was benign in appearance. A cervix from the ectocervix was benign, the endocervical curetting showed CIN-3. As follow-up, she underwent a LEEP procedure on 05/12/2015. 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 Dimensions of the carcinoma were not included however there was a focus of desmoplasia suspicious for invasive carcinoma involving the deep margin. In the area of deep margin involvement the conus 5 mm in thickness.  Her history includes no abnormal pap smears with her last pap smear was in approximately 2011 being normal. The patient is from [place] originally and is Spanish-speaking only. She works as a Engineer, building services. She has a history of 2 prior cesarean sections but no other abdominal surgeries. She underwent CT of the abdomen on 04/22/2015 as part of workup of her right renal mass seen on ultrasound scan. This demonstrated a small complex cystic lesion in the upper pole of the right kidney which is felt to be subjectively a cyst. There was bilateral renal cortical scarring.   PET scan on 06/20/15 resulted: IMPRESSION: 1. No primary hypermetabolic primary cervical mass or evidence of nodal metastasis. 2. Focal hypermetabolism in the lateral left breast. No CT correlate. Consider correlation with diagnostic mammography and ultrasound to exclude otherwise occult breast lesion. 3. Probable left nephrolithiasis.  On 07/01/15, she underwent a robotic-assisted type  III radical laparoscopic hysterectomy with bilateral salpingoophorectomy and bilateral pelvic lymphadenectomy by Dr. Everitt Amber. Her post-operative course was uneventful. Final pathology revealed: Diagnosis 1. Uterus +/- tubes/ovaries, neoplastic, cervix CERVIX WITH LOW GRADE SQUAMOUS INTRAEPITHELIAL LESION, CIN-1 NO RESIDUAL HIGH GRADE INTRAEPITHELIAL LESION OR INVASIVE NEOPLASM IDENTIFIED VAGINAL CUFF, PARAMETRIAL AND ALL RESECTION MARGINS ARE NEGATIVE FOR TUMOR ENDOMETRIAL POLYP AND INACTIVE ENDOMETRIUM MYOMETRIUM: NO PATHOLOGICAL ALTERATIONS BILATERAL FALLOPIAN TUBES: HISTOLOGICAL UNREMARKABLE 2. Lymph node, sentinel, biopsy, right external iliac ONE BENIGN LYMPH NODE (0/1) 3. Lymph node, sentinel, biopsy, left obturator ONE BENIGN LYMPH NODE (0/1)   Interval History: The patient presents today without an appointment with her friend for complaints of significant nausea and vomiting post-operative along with moderate to severe abdominal pain. History and review of systems limited due to language barrier. The patient states she was fine the day she was discharged from the hospital and the day after. On the second day at home, she began developing nausea with decreased appetite. Her symptoms progressed and now she has not had a bowel movement x 1 week. She reports emesis every time she eats including small amounts and when drinking a few ounces of liquid. She reports significant abdominal pain, more on the right abdomen. She is requesting a refill on pain medication and states she has been taking the medication on an empty stomach at times. She has been passing flatus. Reporting intermittent shortness of breath and difficulty catching her breath. Denies increased chest pain with inspiration but reports epigastric pain as well. Denies hematuria, vaginal bleeding, or rectal bleeding. She states her foley has been draining clear, yellow urine. She has been wearing a neoprene waist  support  on her abdomen. Denies fever, chills. Also asking what she needs to take to have a bowel movement.  CT scan of the chest abdomen and pelvis was performed on 07/08/15 in response to her symptoms. It showed evidence of stranding around the bladder (which is consistent with the extensive perivesicular dissection involved with her surgery). Only tiny bubbles of free air are seen consistent with prior surgery. No evidence for GI or GU injury.  Labs revealed signs of dehydration/volume contracture with a slightly elevated (though stable WBC), elevated Hb and 'crit.   CBC    Component Value Date/Time   WBC 11.9* 07/08/2015 1441   WBC 11.9* 07/02/2015 0512   RBC 4.73 07/08/2015 1441   RBC 3.96 07/02/2015 0512   HGB 15.0 07/08/2015 1441   HGB 13.0 07/02/2015 0512   HCT 45.5 07/08/2015 1441   HCT 38.5 07/02/2015 0512   PLT 326 07/08/2015 1441   PLT 227 07/02/2015 0512   MCV 96.1 07/08/2015 1441   MCV 97.2 07/02/2015 0512   MCH 31.7 07/08/2015 1441   MCH 32.8 07/02/2015 0512   MCHC 33.0 07/08/2015 1441   MCHC 33.8 07/02/2015 0512   RDW 12.4 07/08/2015 1441   RDW 12.2 07/02/2015 0512   LYMPHSABS 1.1 07/08/2015 1441   LYMPHSABS 2.2 06/26/2015 1100   MONOABS 0.6 07/08/2015 1441   MONOABS 0.6 06/26/2015 1100   EOSABS 0.2 07/08/2015 1441   EOSABS 0.5 06/26/2015 1100   BASOSABS 0.1 07/08/2015 1441   BASOSABS 0.0 06/26/2015 1100    BMP Latest Ref Rng 07/08/2015 07/02/2015 06/26/2015  Glucose 70 - 140 mg/dl 98 148(H) 96  BUN 7.0 - 26.0 mg/dL 10.8 7 15   Creatinine 0.6 - 1.1 mg/dL 0.8 0.88 0.69  Sodium 136 - 145 mEq/L 138 140 137  Potassium 3.5 - 5.1 mEq/L 4.3 4.1 3.7  Chloride 101 - 111 mmol/L - 109 106  CO2 22 - 29 mEq/L 22 24 23   Calcium 8.4 - 10.4 mg/dL 10.0 9.0 9.2      Review of Systems  Constitutional: Feels "sick". Denies fever, chills. Fatigued and feels weak.  Cardiovascular: Shortness of breath at rest and increased on exertion. No chest pain or edema.  Pulmonary:  Positive for cough with yellow colored phelgm.  Gastrointestinal: Nausea and emesis with any oral intake. No BM x 1 week. No diarrhea. No bright red blood per rectum.  Genitourinary: No frequency, urgency, or dysuria. No vaginal bleeding or discharge.  Musculoskeletal: No myalgia or joint pain. Neurologic: No weakness, numbness, or change in gait.  Psychology: No depression, anxiety, or insomnia.  Current Meds:  No facility-administered encounter medications on file as of 07/08/2015.   Outpatient Encounter Prescriptions as of 07/08/2015  Medication Sig  . docusate sodium (COLACE) 100 MG capsule Take 1 capsule (100 mg total) by mouth 2 (two) times daily.  Marland Kitchen ibuprofen (ADVIL,MOTRIN) 200 MG tablet Take 400 mg by mouth every 6 (six) hours as needed for moderate pain. Reported on 05/22/2015  . OVER THE COUNTER MEDICATION Take 20 mLs by mouth 3 (three) times daily as needed (cough.). T[place]ol. - [place] Product.  Marland Kitchen oxyCODONE (OXY IR/ROXICODONE) 5 MG immediate release tablet Take 1-2 tablets (5-10 mg total) by mouth every 4 (four) hours as needed for severe pain.  . Probiotic Product (PROBIOTIC PO) Take 1 tablet by mouth every evening.  . [DISCONTINUED] oxyCODONE (OXY IR/ROXICODONE) 5 MG immediate release tablet Take 1-2 tablets (5-10 mg total) by mouth every 4 (four) hours as needed for severe  pain.    Allergy: No Known Allergies  Social Hx:  Social History   Social History  . Marital Status: Widowed    Spouse Name: N/A  . Number of Children: N/A  . Years of Education: N/A   Occupational History  . Not on file.   Social History Main Topics  . Smoking status: Never Smoker   . Smokeless tobacco: Not on file  . Alcohol Use: No     Comment: occasionally  . Drug Use: No  . Sexual Activity: Yes    Birth Control/ Protection: Injection   Other Topics Concern  . Not on file   Social History Narrative     Past Surgical Hx:  Past Surgical History  Procedure Laterality Date  . Cesarean section      2 previous  . Cervical biopsy w/ loop electrode excision      12'16  . Robotic assisted total hysterectomy Bilateral 07/01/2015    Procedure: XI ROBOTIC ASSISTED RADICAL TYPE II HYSTERECTOMY, BILATERAL SALPINGECTOMY, SENTINAL LYMPH NODE BIOPSY; Surgeon: Everitt Amber, MD; Location: [facility]; Service: Gynecology; Laterality: Bilateral;    Past Medical Hx:  Past Medical History  Diagnosis Date  . Cancer ([place])     cervical cancer dx.   . Kidney stones     cyst on kidney -no problems  . History of kidney stones     x1     Family Hx:  Family History  Problem Relation Age of Onset  . Hypertension Sister     Vitals: Blood pressure 116/74, pulse 122, temperature 97.8 F (36.6 C), temperature source Oral, resp. rate 18, last menstrual period 06/07/2015.  Physical Exam:  General: Well developed, well nourished female. In no acute distress but appearing lethargic and guarding her abdomen. Alert and oriented x 3.  Cardiovascular: Regular rhythm, tachycardic at 120 bpm. S1 and S2 normal.  Lungs: Clear to auscultation bilaterally. No wheezes/crackles/rhonchi noted.  Skin: No rashes or lesions present. Back: No CVA tenderness.  Abdomen: Abdomen soft and non-obese. Hypoactive in the upper quadrants and active bowel sounds in the lower quadrants.  Extremities: No bilateral cyanosis, edema, or clubbing.   Assessment/Plan: 45 year old female s/p robotic-assisted type III radical laparoscopic hysterectomy with bilateral salpingoophorectomy and bilateral pelvic lymphadenectomy on 07/01/15 now with persistent nausea and vomiting post-operatively.  No gross evidence for GI or GU injury or obstruction. Likely ileus as cause of her emesis. Has dehydration and volume contracture. Will give IV hydration with normal saline overnight. NPO until  resolution of nausea. Recheck labs in am. Prn antiemetics. Check [place] and urine cultures.     Donaciano Eva, MD  Donaciano Eva, MD

## 2015-07-08 NOTE — Progress Notes (Signed)
Follow Up Note: Gyn-Onc  Crystal [name] 45 y.o. female  CC:  Chief Complaint  Patient presents with  . 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  She has a history of her first abnormal Pap smear on 02/26/2015 which was HGSIL. She then underwent colposcopic evaluation of the cervix on 04/02/2015 which was benign in appearance. A cervix from the ectocervix was benign, the endocervical curetting showed CIN-3. As follow-up, she underwent a LEEP procedure on 05/12/2015. 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 Dimensions of the carcinoma were not included however there was a focus of desmoplasia suspicious for invasive carcinoma involving the deep margin. In the area of deep margin involvement the conus 5 mm in thickness.  Her history includes no abnormal pap smears with her last pap smear was in approximately 2011 being normal. The patient is from [place] originally and is Spanish-speaking only. She works as a Engineer, building services.  She has a history of 2 prior cesarean sections but no other abdominal surgeries.  She underwent CT of the abdomen on 04/22/2015 as part of workup of her right renal mass seen on ultrasound scan. This demonstrated a small complex cystic lesion in the upper pole of the right kidney which is felt to be subjectively a cyst. There was bilateral renal cortical scarring.   PET scan on 06/20/15 resulted: IMPRESSION: 1. No primary hypermetabolic primary cervical mass or evidence of nodal metastasis. 2. Focal hypermetabolism in the lateral left breast. No CT correlate. Consider correlation with diagnostic mammography and ultrasound to exclude otherwise occult breast lesion. 3. Probable left nephrolithiasis.  On 07/01/15, she underwent a  robotic-assisted type III radical laparoscopic hysterectomy with bilateral salpingoophorectomy and bilateral pelvic lymphadenectomy by Dr. Everitt Amber.  Her post-operative course was uneventful.  Final pathology revealed: Diagnosis 1. Uterus +/- tubes/ovaries, neoplastic, cervix CERVIX WITH LOW GRADE SQUAMOUS INTRAEPITHELIAL LESION, CIN-1 NO RESIDUAL HIGH GRADE INTRAEPITHELIAL LESION OR INVASIVE NEOPLASM IDENTIFIED VAGINAL CUFF, PARAMETRIAL AND ALL RESECTION MARGINS ARE NEGATIVE FOR TUMOR ENDOMETRIAL POLYP AND INACTIVE ENDOMETRIUM MYOMETRIUM: NO PATHOLOGICAL ALTERATIONS BILATERAL FALLOPIAN TUBES: HISTOLOGICAL UNREMARKABLE 2. Lymph node, sentinel, biopsy, right external iliac ONE BENIGN LYMPH NODE (0/1) 3. Lymph node, sentinel, biopsy, left obturator ONE BENIGN LYMPH NODE (0/1)   Interval History: The patient presents today without an appointment with her friend for complaints of significant nausea and vomiting post-operative along with moderate to severe abdominal pain.  History and review of systems limited due to language barrier.  The patient states she was fine the day she was discharged from the hospital and the day after.  On the second day at home, she began developing nausea with decreased appetite.  Her symptoms progressed and now she has not had a bowel movement x 1 week.  She reports emesis every time she eats including small amounts and when drinking a few ounces of liquid.  She reports significant abdominal pain, more on the right abdomen.  She is requesting a refill on pain medication and states she has been taking the medication on an empty stomach at times.  She has been passing flatus.  Reporting intermittent shortness of breath and difficulty catching her breath.  Denies increased chest pain with inspiration but reports epigastric pain as well.  Denies hematuria,  vaginal bleeding, or rectal bleeding.  She states her foley has been draining clear, yellow urine.  She has been wearing a  neoprene waist support on her abdomen.  Denies fever, chills.  Also asking what she needs to take to have a bowel movement.  Review of Systems  Constitutional: Feels "sick".  Denies fever, chills.  Fatigued and feels weak.  Cardiovascular: Shortness of breath at rest and increased on exertion.  No chest pain or edema.  Pulmonary: Positive for cough with yellow colored phelgm.  Gastrointestinal: Nausea and emesis with any oral intake.  No BM x 1 week.  No diarrhea. No bright red blood per rectum.  Genitourinary: No frequency, urgency, or dysuria. No vaginal bleeding or discharge.  Musculoskeletal: No myalgia or joint pain. Neurologic: No weakness, numbness, or change in gait.  Psychology: No depression, anxiety, or insomnia.  Current Meds:  No facility-administered encounter medications on file as of 07/08/2015.   Outpatient Encounter Prescriptions as of 07/08/2015  Medication Sig  . docusate sodium (COLACE) 100 MG capsule Take 1 capsule (100 mg total) by mouth 2 (two) times daily.  Marland Kitchen ibuprofen (ADVIL,MOTRIN) 200 MG tablet Take 400 mg by mouth every 6 (six) hours as needed for moderate pain. Reported on 05/22/2015  . OVER THE COUNTER MEDICATION Take 20 mLs by mouth 3 (three) times daily as needed (cough.). Tukol. - [place] Product.  Marland Kitchen oxyCODONE (OXY IR/ROXICODONE) 5 MG immediate release tablet Take 1-2 tablets (5-10 mg total) by mouth every 4 (four) hours as needed for severe pain.  . Probiotic Product (PROBIOTIC PO) Take 1 tablet by mouth every evening.  . [DISCONTINUED] oxyCODONE (OXY IR/ROXICODONE) 5 MG immediate release tablet Take 1-2 tablets (5-10 mg total) by mouth every 4 (four) hours as needed for severe pain.    Allergy: No Known Allergies  Social Hx:   Social History   Social History  . Marital Status: Widowed    Spouse Name: N/A  . Number of Children: N/A  . Years of Education: N/A   Occupational History  . Not on file.   Social History Main Topics  . Smoking status:  Never Smoker   . Smokeless tobacco: Not on file  . Alcohol Use: No     Comment: occasionally  . Drug Use: No  . Sexual Activity: Yes    Birth Control/ Protection: Injection   Other Topics Concern  . Not on file   Social History Narrative    Past Surgical Hx:  Past Surgical History  Procedure Laterality Date  . Cesarean section      2 previous  . Cervical biopsy  w/ loop electrode excision      12'16  . Robotic assisted total hysterectomy Bilateral 07/01/2015    Procedure: XI ROBOTIC ASSISTED RADICAL TYPE II HYSTERECTOMY, BILATERAL SALPINGECTOMY, SENTINAL LYMPH NODE BIOPSY;  Surgeon: Everitt Amber, MD;  Location: [facility];  Service: Gynecology;  Laterality: Bilateral;    Past Medical Hx:  Past Medical History  Diagnosis Date  . Cancer ([place])     cervical cancer dx.   . Kidney stones     cyst on kidney -no problems  . History of kidney stones     x1     Family Hx:  Family History  Problem Relation Age of Onset  . Hypertension Sister     Vitals:  Blood pressure 116/74, pulse 122, temperature 97.8 F (36.6 C), temperature source Oral, resp. rate 18, last menstrual period 06/07/2015.  Physical Exam:  General: Well developed,  well nourished female.  In no acute distress but appearing lethargic and guarding her abdomen. Alert and oriented x 3.  Cardiovascular: Regular rhythm, tachycardic at 120 bpm. S1 and S2 normal.  Lungs: Clear to auscultation bilaterally. No wheezes/crackles/rhonchi noted.  Skin: No rashes or lesions present. Back: No CVA tenderness.  Abdomen: Abdomen soft and non-obese. Hypoactive in the upper quadrants and active bowel sounds in the lower quadrants.  Extremities: No bilateral cyanosis, edema, or clubbing.   Assessment/Plan: 45 year old female s/p robotic-assisted type III radical laparoscopic hysterectomy with bilateral salpingoophorectomy and bilateral pelvic lymphadenectomy on 07/01/15 by Dr. Everitt Amber now with persistent nausea and vomiting  post-operatively.  She also has shortness of breath and is tachycardic on examination.  Due to her continued nausea and emesis with any oral intake, significant abdominal pain, shortness of breath, and tachycardia, a CT scan of the chest, abdomen, and pelvis ordered per Dr. Denman George to rule out ileus, obstruction, bowel injury, abscess, or other abnormal abdominal/respiratory finding.  CBC with diff and Bmet will also be ordered to assess for dehydration or any other abnormalities.  Foley to remain in place at this time until CT results available.  Refill given on oxycodone at this time along with new prescription for zofran sent to [facility] per pt request.  Will follow up on results of lab work and CT scan results for possible admission if needed.         Kimm Ungaro DEAL, NP 07/08/2015, 6:40 PM

## 2015-07-09 ENCOUNTER — Ambulatory Visit: Payer: Self-pay | Admitting: Gynecologic Oncology

## 2015-07-09 DIAGNOSIS — N390 Urinary tract infection, site not specified: Secondary | ICD-10-CM | POA: Diagnosis present

## 2015-07-09 DIAGNOSIS — T83511A Infection and inflammatory reaction due to indwelling urethral catheter, initial encounter: Secondary | ICD-10-CM

## 2015-07-09 LAB — URINALYSIS, ROUTINE W REFLEX MICROSCOPIC
BILIRUBIN URINE: NEGATIVE
GLUCOSE, UA: NEGATIVE mg/dL
KETONES UR: 40 mg/dL — AB
Nitrite: POSITIVE — AB
PH: 7.5 (ref 5.0–8.0)
Protein, ur: NEGATIVE mg/dL
Specific Gravity, Urine: 1.016 (ref 1.005–1.030)

## 2015-07-09 LAB — CBC WITH DIFFERENTIAL/PLATELET
BASOS PCT: 0 %
Basophils Absolute: 0 10*3/uL (ref 0.0–0.1)
Eosinophils Absolute: 0.4 10*3/uL (ref 0.0–0.7)
Eosinophils Relative: 5 %
HEMATOCRIT: 38.4 % (ref 36.0–46.0)
Hemoglobin: 12.4 g/dL (ref 12.0–15.0)
Lymphocytes Relative: 16 %
Lymphs Abs: 1.3 10*3/uL (ref 0.7–4.0)
MCH: 31.5 pg (ref 26.0–34.0)
MCHC: 32.3 g/dL (ref 30.0–36.0)
MCV: 97.5 fL (ref 78.0–100.0)
MONO ABS: 0.6 10*3/uL (ref 0.1–1.0)
MONOS PCT: 7 %
NEUTROS ABS: 5.6 10*3/uL (ref 1.7–7.7)
Neutrophils Relative %: 72 %
Platelets: 304 10*3/uL (ref 150–400)
RBC: 3.94 MIL/uL (ref 3.87–5.11)
RDW: 12.3 % (ref 11.5–15.5)
WBC: 7.9 10*3/uL (ref 4.0–10.5)

## 2015-07-09 LAB — BASIC METABOLIC PANEL
Anion gap: 9 (ref 5–15)
BUN: 10 mg/dL (ref 6–20)
CALCIUM: 8.4 mg/dL — AB (ref 8.9–10.3)
CO2: 19 mmol/L — AB (ref 22–32)
CREATININE: 0.7 mg/dL (ref 0.44–1.00)
Chloride: 111 mmol/L (ref 101–111)
GFR calc non Af Amer: >60 mL/min (ref >60)
GLUCOSE: 85 mg/dL (ref 65–99)
Potassium: 4.1 mmol/L (ref 3.5–5.1)
Sodium: 139 mmol/L (ref 135–145)

## 2015-07-09 LAB — URINE MICROSCOPIC-ADD ON

## 2015-07-09 MED ORDER — MAGNESIUM CITRATE PO SOLN
1.0000 | Freq: Once | ORAL | Status: AC
Start: 2015-07-09 — End: 2015-07-09
  Administered 2015-07-09: 1 via ORAL

## 2015-07-09 MED ORDER — CIPROFLOXACIN HCL 500 MG PO TABS
500.0000 mg | ORAL_TABLET | Freq: Two times a day (BID) | ORAL | Status: DC
  Administered 2015-07-09 – 2015-07-10 (×2): 500 mg via ORAL
  Filled 2015-07-09 (×2): qty 1

## 2015-07-09 MED ORDER — POLYETHYLENE GLYCOL 3350 17 G PO PACK
17.0000 g | PACK | Freq: Every day | ORAL | Status: DC
  Administered 2015-07-10: 17 g via ORAL
  Filled 2015-07-09 (×2): qty 1

## 2015-07-09 MED ORDER — FLEET ENEMA 7-19 GM/118ML RE ENEM
1.0000 | ENEMA | Freq: Once | RECTAL | Status: AC
  Administered 2015-07-09: 1 via RECTAL
  Filled 2015-07-09: qty 1

## 2015-07-09 NOTE — Progress Notes (Signed)
Patient seen earlier this am by Dr. Delsa Sale.  Upon entering the room this am, patient in the bathroom stating she can feel the stool but it is not coming out.  Stating she was given a suppository with no relief.  Advised that her RN would be notified to give her an enema.  Diet advanced to regular as tolerated.  Foley to be removed as well with voiding trial.  She is to be bladder scanned after four hours of no void.  Will follow up on the patient after lunch.  RN notified of the situation.

## 2015-07-09 NOTE — Progress Notes (Signed)
GYN ONC: 45 year old female s/p robotic-assisted type III radical laparoscopic hysterectomy with bilateral salpingoophorectomy and bilateral pelvic lymphadenectomy on 07/01/15 admitted with persistent nausea and vomiting post-operatively. No gross evidence for GI or GU injury or obstruction. Likely ileus as cause of her emesis.  Has dehydration and volume contracture.  Admitted for IV hydration and observation.  Subjective: Patient reports rectal pressure and only having small pieces of stool after having a suppository, fleets enema, and completion of a bottle of magnesium citrate 30 mins prior.  She has drank a small amount of juice but has not taken in any solid food due to abdominal discomfort related to her constipation.  Denies nausea and emesis.  She has voided since foley removal.  Complaining of lower back pain intermittently.  Informed her that Cipro had been ordered to treat a urinary tract infection.  Ambulating without difficulty.  Patient stating there is no way she could go home today.  No other concerns voiced.    Objective: Vital signs in last 24 hours: Temp:  [97.5 F (36.4 C)-98.9 F (37.2 C)] 97.5 F (36.4 C) (02/15 1556) Pulse Rate:  [84-106] 99 (02/15 1556) Resp:  [16-20] 20 (02/15 1556) BP: (107-136)/(53-70) 123/66 mmHg (02/15 1556) SpO2:  [95 %-100 %] 98 % (02/15 1556) Weight:  [110 lb 10.7 oz (50.2 kg)] 110 lb 10.7 oz (50.2 kg) (02/14 1806) Last BM Date: 07/01/15  Intake/Output from previous day: 02/14 0701 - 02/15 0700 In: 2128.8 [I.V.:2020.8; IV Piggyback:108] Out: 1000 [Urine:1000]  Physical Examination: General: alert, cooperative and no distress Resp: clear to auscultation bilaterally Cardio: regular rate and rhythm, S1, S2 normal, no murmur, click, rub or gallop GI: incision: lap sites healing without erythema or drainage and abdomen soft, tender to palpation, non-distended, hyperactive bowel sounds Extremities: extremities normal, atraumatic, no cyanosis or  edema Patient checked rectally for impaction.  Stool in the rectum but soft, hemorrhoids present but not inflamed  Labs: WBC/Hgb/Hct/Plts:  7.9/12.4/38.4/304 (02/15 [id number]) BUN/Cr/glu/ALT/AST/amyl/lip:  10/0.70/--/--/--/--/-- (02/15 [id number])  Assessment: 45 y.o. s/p : Robotic-assisted type III radical laparoscopic hysterectomy with bilateral salpingoophorectomy and bilateral pelvic lymphadenectomy on 07/01/15.    Pain:  Pain is well-controlled on PRN medications.  Heme: Hgb 12.4 and Hct 38.4 this am.  Stable at this time.  ID: Cipro started per Dr. Denman George for urinary tract infection.    CV: BP and HR stable at this time.  Tachycardia improved after IV hydration.  GI:  Tolerating po: small amounts.  Decreased PO due to abdominal discomfort.  GU: Void x 1 since foley removal.    FEN: Stable at this time.    Prophylaxis: Lovenox ordered.  Plan: IV to 75 cc/hr Miralax ordered daily per Dr. Delsa Sale Labs ordered for the am Encourage ambulation and PO intake RN advised to continue monitoring urine output Cipro BID per Dr. Denman George Plan for possible discharge in the am    CROSS, Crystal [name] 07/09/2015, 3:57 PM

## 2015-07-10 DIAGNOSIS — N390 Urinary tract infection, site not specified: Secondary | ICD-10-CM

## 2015-07-10 LAB — BASIC METABOLIC PANEL
ANION GAP: 8 (ref 5–15)
BUN: 8 mg/dL (ref 6–20)
CALCIUM: 8.5 mg/dL — AB (ref 8.9–10.3)
CO2: 19 mmol/L — AB (ref 22–32)
Chloride: 110 mmol/L (ref 101–111)
Creatinine, Ser: 0.5 mg/dL (ref 0.44–1.00)
Glucose, Bld: 99 mg/dL (ref 65–99)
Potassium: 3.8 mmol/L (ref 3.5–5.1)
SODIUM: 137 mmol/L (ref 135–145)

## 2015-07-10 LAB — CBC WITH DIFFERENTIAL/PLATELET
BASOS ABS: 0 10*3/uL (ref 0.0–0.1)
BASOS PCT: 0 %
Eosinophils Absolute: 0.4 10*3/uL (ref 0.0–0.7)
Eosinophils Relative: 5 %
HEMATOCRIT: 37.1 % (ref 36.0–46.0)
Hemoglobin: 12.3 g/dL (ref 12.0–15.0)
Lymphocytes Relative: 14 %
Lymphs Abs: 1.1 10*3/uL (ref 0.7–4.0)
MCH: 32.2 pg (ref 26.0–34.0)
MCHC: 33.2 g/dL (ref 30.0–36.0)
MCV: 97.1 fL (ref 78.0–100.0)
MONO ABS: 0.7 10*3/uL (ref 0.1–1.0)
Monocytes Relative: 8 %
NEUTROS ABS: 6 10*3/uL (ref 1.7–7.7)
Neutrophils Relative %: 73 %
PLATELETS: 326 10*3/uL (ref 150–400)
RBC: 3.82 MIL/uL — ABNORMAL LOW (ref 3.87–5.11)
RDW: 12.4 % (ref 11.5–15.5)
WBC: 8.2 10*3/uL (ref 4.0–10.5)

## 2015-07-10 MED ORDER — CIPROFLOXACIN HCL 500 MG PO TABS: 500.0000 mg | ORAL_TABLET | Freq: Two times a day (BID) | ORAL | Status: DC

## 2015-07-10 MED ORDER — POLYETHYLENE GLYCOL 3350 17 G PO PACK: 17.0000 g | PACK | Freq: Every day | ORAL | Status: DC

## 2015-07-10 MED ORDER — ALUM & MAG HYDROXIDE-SIMETH 200-200-20 MG/5ML PO SUSP: 30.0000 mL | ORAL | Status: DC | PRN

## 2015-07-10 MED ORDER — DOCUSATE SODIUM 100 MG PO CAPS: 100.0000 mg | ORAL_CAPSULE | Freq: Two times a day (BID) | ORAL | Status: DC

## 2015-07-10 NOTE — Progress Notes (Signed)
Went over all discharge information with patient with interpreter services [phone]).  Pt stated that all questions were answered.  Prescriptions and how to pick them up were explained to patient and how often to take medications.

## 2015-07-10 NOTE — Discharge Summary (Signed)
Physician Discharge Summary  Patient ID: Crystal [name] MRN: [id number] DOB/AGE: 47-Jun-1972 47 y.o.  Admit date: 07/08/2015 Discharge date: 07/10/2015  Admission Diagnoses: Post-operative nausea and vomiting  Discharge Diagnoses:  Principal Problem:   Post-operative nausea and vomiting Active Problems:   Urinary tract infection associated with catheterization of urinary tract [facility])   Discharged Condition: good  Hospital Course: the patient was readmitted to hospital on 07/08/15, 7 days postop from a robotic radical hysterectomy and BSO for stage IB cervical cancer. She had symptoms of abdominal pain and shortness of breath and constipation. Labs revealed she was dehydrated. CT scan of abdo/pelvis revealed she had no evidence for abscess or GI or GU injury. She had perivesicular inflammation on scan and UA was consistent with UTI. She was started on empiric ciprofloxacin for this. She received IV hydration and an aggressive bowel regimen. She began stooling on hospital day 2 and began feeling better. Her foley was removed and she passed a voiding trail.  Consults: None  Significant Diagnostic Studies: labs:  CBC    Component Value Date/Time   WBC 8.2 07/10/2015 0439   WBC 11.9* 07/08/2015 1441   RBC 3.82* 07/10/2015 0439   RBC 4.73 07/08/2015 1441   HGB 12.3 07/10/2015 0439   HGB 15.0 07/08/2015 1441   HCT 37.1 07/10/2015 0439   HCT 45.5 07/08/2015 1441   PLT 326 07/10/2015 0439   PLT 326 07/08/2015 1441   MCV 97.1 07/10/2015 0439   MCV 96.1 07/08/2015 1441   MCH 32.2 07/10/2015 0439   MCH 31.7 07/08/2015 1441   MCHC 33.2 07/10/2015 0439   MCHC 33.0 07/08/2015 1441   RDW 12.4 07/10/2015 0439   RDW 12.4 07/08/2015 1441   LYMPHSABS 1.1 07/10/2015 0439   LYMPHSABS 1.1 07/08/2015 1441   MONOABS 0.7 07/10/2015 0439   MONOABS 0.6 07/08/2015 1441   EOSABS 0.4 07/10/2015 0439   EOSABS 0.2 07/08/2015 1441   BASOSABS 0.0 07/10/2015 0439   BASOSABS 0.1 07/08/2015  1441   BMP Latest Ref Rng 07/10/2015 07/09/2015 07/08/2015  Glucose 65 - 99 mg/dL 99 85 98  BUN 6 - 20 mg/dL 8 10 10.8  Creatinine 0.44 - 1.00 mg/dL 0.50 0.70 0.63  Sodium 135 - 145 mmol/L 137 139 138  Potassium 3.5 - 5.1 mmol/L 3.8 4.1 4.3  Chloride 101 - 111 mmol/L 110 111 -  CO2 22 - 32 mmol/L 19(L) 19(L) 22  Calcium 8.9 - 10.3 mg/dL 8.5(L) 8.4(L) 10.0     Treatments: IV hydration and ciprofloxacin (oral) and laxative therapy.  Discharge Exam: Blood pressure 122/60, pulse 65, temperature 98.2 F (36.8 C), temperature source Oral, resp. rate 18, height 4\' 10"  (1.473 m), weight 110 lb 10.7 oz (50.2 kg), last menstrual period 06/07/2015, SpO2 98 %. General appearance: alert and cooperative Resp: clear to auscultation bilaterally Chest wall: no tenderness GI: soft, non-tender; bowel sounds normal; no masses,  no organomegaly Incision/Wound:  Disposition: 01-Home or Self Care  Discharge Instructions    (HEART FAILURE PATIENTS) Call MD:  Anytime you have any of the following symptoms: 1) 3 pound weight gain in 24 hours or 5 pounds in 1 week 2) shortness of breath, with or without a dry hacking cough 3) swelling in the hands, feet or stomach 4) if you have to sleep on extra pillows at night in order to breathe.    Complete by:  As directed      Call MD for:  difficulty breathing, headache or visual disturbances  Complete by:  As directed      Call MD for:  extreme fatigue    Complete by:  As directed      Call MD for:  hives    Complete by:  As directed      Call MD for:  persistant dizziness or light-headedness    Complete by:  As directed      Call MD for:  persistant nausea and vomiting    Complete by:  As directed      Call MD for:  redness, tenderness, or signs of infection (pain, swelling, redness, odor or green/yellow discharge around incision site)    Complete by:  As directed      Call MD for:  severe uncontrolled pain    Complete by:  As directed      Call MD for:   temperature >100.4    Complete by:  As directed      Diet - low sodium heart healthy    Complete by:  As directed      Diet general    Complete by:  As directed      Driving Restrictions    Complete by:  As directed   No driving for 7 days or until off narcotic pain medication     Increase activity slowly    Complete by:  As directed      Remove dressing in 24 hours    Complete by:  As directed      Sexual Activity Restrictions    Complete by:  As directed   No intercourse for 6 weeks            Medication List    TAKE these medications        alum & mag hydroxide-simeth 200-200-20 MG/5ML suspension  Commonly known as:  MAALOX/MYLANTA  Take 30 mLs by mouth every 4 (four) hours as needed for indigestion.     ciprofloxacin 500 MG tablet  Commonly known as:  CIPRO  Take 1 tablet (500 mg total) by mouth 2 (two) times daily.     docusate sodium 100 MG capsule  Commonly known as:  COLACE  Take 1 capsule (100 mg total) by mouth 2 (two) times daily.     ibuprofen 200 MG tablet  Commonly known as:  ADVIL,MOTRIN  Take 400 mg by mouth every 6 (six) hours as needed for moderate pain. Reported on 05/22/2015     ondansetron 8 MG tablet  Commonly known as:  ZOFRAN  Take 1 tablet (8 mg total) by mouth every 8 (eight) hours as needed for nausea or vomiting.     OVER THE COUNTER MEDICATION  Take 20 mLs by mouth 3 (three) times daily as needed (cough.). Tukol. - [place] Product.     oxyCODONE 5 MG immediate release tablet  Commonly known as:  Oxy IR/ROXICODONE  Take 1-2 tablets (5-10 mg total) by mouth every 4 (four) hours as needed for severe pain.     polyethylene glycol packet  Commonly known as:  MIRALAX / GLYCOLAX  Take 17 g by mouth daily.     PROBIOTIC PO  Take 1 tablet by mouth every evening.     sennosides-docusate sodium 8.6-50 MG tablet  Commonly known as:  SENOKOT-S  Take 1 tablet by mouth 2 (two) times daily.           Follow-up Information    Follow up  with Donaciano Eva, MD In 2 weeks.   Specialty:  Obstetrics  and Gynecology   Contact information:   [place] [address] [phone]       Signed: Donaciano Eva 07/10/2015, 9:07 AM

## 2015-07-10 NOTE — Progress Notes (Signed)
Foley inserted for acute urinary retention.  Foley care explained to patient and family.  Patient did not want to use the leg bag.  All questions answered.

## 2015-07-11 LAB — URINE CULTURE: Culture: >=100000

## 2015-07-14 ENCOUNTER — Ambulatory Visit: Payer: Self-pay | Attending: Gynecologic Oncology | Admitting: Gynecologic Oncology

## 2015-07-14 ENCOUNTER — Telehealth: Payer: Self-pay | Admitting: *Deleted

## 2015-07-14 ENCOUNTER — Encounter: Payer: Self-pay | Admitting: Gynecologic Oncology

## 2015-07-14 VITALS — BP 104/70 | HR 108 | Temp 97.9°F | Resp 18 | Ht <= 58 in | Wt 104.1 lb

## 2015-07-14 DIAGNOSIS — N898 Other specified noninflammatory disorders of vagina: Secondary | ICD-10-CM

## 2015-07-14 DIAGNOSIS — Z9079 Acquired absence of other genital organ(s): Secondary | ICD-10-CM | POA: Insufficient documentation

## 2015-07-14 DIAGNOSIS — D069 Carcinoma in situ of cervix, unspecified: Secondary | ICD-10-CM | POA: Insufficient documentation

## 2015-07-14 DIAGNOSIS — R112 Nausea with vomiting, unspecified: Secondary | ICD-10-CM

## 2015-07-14 DIAGNOSIS — N870 Mild cervical dysplasia: Secondary | ICD-10-CM | POA: Insufficient documentation

## 2015-07-14 DIAGNOSIS — R948 Abnormal results of function studies of other organs and systems: Secondary | ICD-10-CM

## 2015-07-14 DIAGNOSIS — Z9889 Other specified postprocedural states: Secondary | ICD-10-CM

## 2015-07-14 DIAGNOSIS — N2889 Other specified disorders of kidney and ureter: Secondary | ICD-10-CM | POA: Insufficient documentation

## 2015-07-14 DIAGNOSIS — G8918 Other acute postprocedural pain: Secondary | ICD-10-CM

## 2015-07-14 DIAGNOSIS — R109 Unspecified abdominal pain: Secondary | ICD-10-CM | POA: Insufficient documentation

## 2015-07-14 DIAGNOSIS — N840 Polyp of corpus uteri: Secondary | ICD-10-CM | POA: Insufficient documentation

## 2015-07-14 DIAGNOSIS — Z87442 Personal history of urinary calculi: Secondary | ICD-10-CM | POA: Insufficient documentation

## 2015-07-14 DIAGNOSIS — L298 Other pruritus: Secondary | ICD-10-CM

## 2015-07-14 DIAGNOSIS — Z90710 Acquired absence of both cervix and uterus: Secondary | ICD-10-CM | POA: Insufficient documentation

## 2015-07-14 DIAGNOSIS — Z90722 Acquired absence of ovaries, bilateral: Secondary | ICD-10-CM | POA: Insufficient documentation

## 2015-07-14 MED ORDER — OXYCODONE HCL 5 MG PO TABS: 5.0000 mg | ORAL_TABLET | ORAL | Status: DC | PRN

## 2015-07-14 MED ORDER — FLUCONAZOLE 100 MG PO TABS: 100.0000 mg | ORAL_TABLET | Freq: Once | ORAL | Status: DC

## 2015-07-14 NOTE — Patient Instructions (Signed)
Continue to monitor urine output and call for increased abdominal pain, decreased voiding amount.  You will also receive a phone call [facility] about [facility] to be seen in the [facility] clinic before having your mammogram.

## 2015-07-14 NOTE — Telephone Encounter (Signed)
Mammogram ordered by Dr. Denman George  due to patients insurance status pt must go through [facility] for assistance with her mammogram. Called [facility] spoke with [place] . Pt must be scheduled with [facility] for an exam then [facility] will arrange her mammogram. Gabriel Cirri  will call the patient to arrange appointments

## 2015-07-15 NOTE — Progress Notes (Signed)
Follow Up Note: Gyn-Onc  Crystal [name] 45 y.o. female  CC:  Chief Complaint  Patient presents with  . Routine Post Op    Foley removal    HPI: Crystal [name] is a 45 year old female initially referred by Dr. 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  She had her first abnormal Pap smear on 02/26/2015 which was HGSIL. She then underwent colposcopic evaluation of the cervix on 04/02/2015, which was benign in appearance. A biopsy from the ectocervix was benign and endocervical curetting showed CIN-3. As follow-up, she underwent a LEEP procedure on 05/12/2015. 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 Dimensions of the carcinoma were not included however there was a focus of dysplasia suspicious for invasive carcinoma involving the deep margin.  PET on 06/20/15: IMPRESSION: 1. No primary hypermetabolic primary cervical mass or evidence of nodal metastasis. 2. Focal hypermetabolism in the lateral left breast. No CT correlate. Consider correlation with diagnostic mammography and ultrasound to exclude otherwise occult breast lesion. 3. Probable left nephrolithiasis  On 07/01/15, she underwent a robotic-assisted type III radical laparoscopic hysterectomy with bilateral salpingoophorectomy and bilateral pelvic lymphadenectomy by Dr. Denman George.  Final pathology revealed: Diagnosis 1. Uterus +/- tubes/ovaries, neoplastic, cervix CERVIX WITH LOW GRADE SQUAMOUS INTRAEPITHELIAL LESION, CIN-1 NO RESIDUAL HIGH GRADE INTRAEPITHELIAL LESION OR INVASIVE NEOPLASM IDENTIFIED VAGINAL CUFF, PARAMETRIAL AND ALL RESECTION MARGINS ARE NEGATIVE FOR TUMOR ENDOMETRIAL POLYP AND INACTIVE ENDOMETRIUM MYOMETRIUM: NO PATHOLOGICAL ALTERATIONS BILATERAL FALLOPIAN TUBES: HISTOLOGICAL UNREMARKABLE 2. Lymph node, sentinel, biopsy, right external iliac ONE BENIGN LYMPH NODE (0/1) 3. Lymph node, sentinel,  biopsy, left obturator ONE BENIGN LYMPH NODE (0/1)  She was re-admitted to the hospital on POD 7, 07/08/15, for persistent nausea, vomiting, dehydration, no BM, dyspnea.  CT CAP on 07/08/15:IMPRESSION: 1. Suggestion of mild diffuse bladder wall thickening and hyperenhancement with perivesical fat haziness, suggesting acute cystitis. Correlate with urinalysis. Foley catheter appears well positioned within the nondistended bladder. 2. Status post hysterectomy. Mild nonspecific fat stranding and ill-defined fluid throughout the deep pelvis and presacral space probably represents expected postsurgical change. No focal fluid collections to suggest an abscess. 3. Minimal intraperitoneal gas under the right hemidiaphragm, probably within normal limits given recent surgery. 4. No evidence of bowel obstruction or acute bowel inflammation. Normal appendix. Mild to moderate colorectal stool volume could indicate mild constipation. 5. No hydronephrosis. Small left ureterocele. No evidence of urinary tract injury. 6. No active cardiopulmonary disease. 7. No evidence of metastatic disease in the chest, abdomen or pelvis.  She was discharged home on 07/10/15 after IV hydration, aggressive bowel regimen.  She failed a voiding trial and the foley had to be replaced due to urinary retention.  Interval History:  She presents today with her daughter, friend, and an interpreter for post-operative follow up and foley removal/trial voiding.  She reports doing well at home with no fever or chills.  Tolerating diet with no nausea or emesis reported.  Bowels functioning with constipation resolved.  She is taking Colace daily.  Adequate urine output reported from the foley.  She has one more day of Cipro to take.  Reports intermittent back discomfort that is improving.  Her shortness of breath has improved but she reports fatigue after ambulating a moderate distance.  Minimal pain reported.  Using oxycodone as needed and requesting  a refill on pain medication.  Ambulating without difficulty.  Denies vaginal bleeding but reporting vaginal itching.  No other concerns voiced.      Review of Systems  Constitutional: Feels well.  No fever, chills, early satiety, or unintentional weight loss or gain.  Cardiovascular: No chest pain, shortness of breath, or edema.  Pulmonary: No cough or wheeze.  Gastrointestinal: No nausea, vomiting, or diarrhea. No bright red blood per rectum or change in bowel movement.  Genitourinary: No frequency, urgency, or dysuria. No vaginal bleeding or discharge.  Musculoskeletal: No myalgia or joint pain. Neurologic: No weakness, numbness, or change in gait.  Psychology: No depression, anxiety, or insomnia.  Current Meds:  Outpatient Encounter Prescriptions as of 07/14/2015  Medication Sig  . alum & mag hydroxide-simeth (MAALOX/MYLANTA) 200-200-20 MG/5ML suspension Take 30 mLs by mouth every 4 (four) hours as needed for indigestion.  . ciprofloxacin (CIPRO) 500 MG tablet Take 1 tablet (500 mg total) by mouth 2 (two) times daily.  Marland Kitchen docusate sodium (COLACE) 100 MG capsule Take 1 capsule (100 mg total) by mouth 2 (two) times daily.  Marland Kitchen ibuprofen (ADVIL,MOTRIN) 200 MG tablet Take 400 mg by mouth every 6 (six) hours as needed for moderate pain. Reported on 05/22/2015  . ondansetron (ZOFRAN) 8 MG tablet Take 1 tablet (8 mg total) by mouth every 8 (eight) hours as needed for nausea or vomiting.  Marland Kitchen OVER THE COUNTER MEDICATION Take 20 mLs by mouth 3 (three) times daily as needed (cough.). [place]. - [place] Product.  Marland Kitchen oxyCODONE (OXY IR/ROXICODONE) 5 MG immediate release tablet Take 1-2 tablets (5-10 mg total) by mouth every 4 (four) hours as needed for severe pain.  . polyethylene glycol (MIRALAX / GLYCOLAX) packet Take 17 g by mouth daily.  . Probiotic Product (PROBIOTIC PO) Take 1 tablet by mouth every evening.  . sennosides-docusate sodium (SENOKOT-S) 8.6-50 MG tablet Take 1 tablet by mouth 2 (two) times  daily.  . [DISCONTINUED] oxyCODONE (OXY IR/ROXICODONE) 5 MG immediate release tablet Take 1-2 tablets (5-10 mg total) by mouth every 4 (four) hours as needed for severe pain.  . fluconazole (DIFLUCAN) 100 MG tablet Take 1 tablet (100 mg total) by mouth once.   No facility-administered encounter medications on file as of 07/14/2015.    Allergy: No Known Allergies  Social Hx:   Social History   Social History  . Marital Status: Widowed    Spouse Name: N/A  . Number of Children: N/A  . Years of Education: N/A   Occupational History  . Not on file.   Social History Main Topics  . Smoking status: Never Smoker   . Smokeless tobacco: Never Used  . Alcohol Use: No     Comment: occasionally  . Drug Use: No  . Sexual Activity: Yes    Birth Control/ Protection: Injection   Other Topics Concern  . Not on file   Social History Narrative    Past Surgical Hx:  Past Surgical History  Procedure Laterality Date  . Cesarean section      2 previous  . Cervical biopsy  w/ loop electrode excision      12'16  . Robotic assisted total hysterectomy Bilateral 07/01/2015    Procedure: XI ROBOTIC ASSISTED RADICAL TYPE II HYSTERECTOMY, BILATERAL SALPINGECTOMY, SENTINAL LYMPH NODE BIOPSY;  Surgeon: Everitt Amber, MD;  Location: [facility];  Service: Gynecology;  Laterality: Bilateral;    Past Medical Hx:  Past Medical History  Diagnosis Date  . Cancer ([place])     cervical cancer dx.   . Kidney stones     cyst on kidney -no problems  .  History of kidney stones     x1     Family Hx:  Family History  Problem Relation Age of Onset  . Hypertension Sister     Vitals:  Blood pressure 104/70, pulse 108, temperature 97.9 F (36.6 C), temperature source Oral, resp. rate 18, height 4\' 10"  (1.473 m), weight 104 lb 1.6 oz (47.219 kg), last menstrual period 06/07/2015, SpO2 97 %.  Physical Exam:  General: Well developed, well nourished female in no acute distress. Alert and oriented x 3.   Cardiovascular: Regular rate and rhythm. S1 and S2 normal.  Lungs: Clear to auscultation bilaterally. No wheezes/crackles/rhonchi noted.  Skin: No rashes or lesions present. Back: No CVA tenderness.  Abdomen: Abdomen soft, non-tender and non-obese. Active bowel sounds in all quadrants. No evidence of a fluid wave or abdominal masses.  Lap sites healing well without erythema or drainage.  Extremities: No bilateral cyanosis, edema, or clubbing.  Foley draining clear, yellow urine.  328cc or sterile saline instilled in the bladder via catheter per Dr. Denman George then catheter removed without difficulty.  Patient was able to void 450 cc of clear, yellow without difficulty. (No need for I&O cath to check residual per Dr. Denman George)    Assessment/Plan:  45 year old s/p robotic-assisted type III radical laparoscopic hysterectomy with bilateral salpingoophorectomy and bilateral pelvic lymphadenectomy by Dr. Denman George on 07/01/15.  She is doing well post-operatively.  She has passed her voiding trial today.  Reportable signs and symptoms reviewed.  Diflucan one tablet 100 mg oral x 1 sent to patient's pharmacy per request but she is advised she could use monistat cream only on the vulva for external symptoms as well.  She is advised to continue to monitor urine output and call for increased abdominal pain, decreased voiding amount.  Advised she would receive a [facility] call [facility] about [facility] to be seen in [facility] clinic before having your mammogram.  Plan for diagnostic mammogram with possible ultrasound if needed based on PET results of focal hypermetabolism of the left lateral breast.  She must go through the [facility] program in order to have the ordered studies.  Post-operative instructions reinforced.  She is advised to follow up with Dr. Denman George as scheduled or sooner if needed.  She is advised to call for any questions or concerns.  Supplies given to continue monitoring urine output including  measuring device and recording sheet.       Cindee Mclester DEAL, NP 07/15/2015, 12:30 PM

## 2015-07-21 ENCOUNTER — Other Ambulatory Visit (HOSPITAL_COMMUNITY): Payer: Self-pay | Admitting: *Deleted

## 2015-07-21 ENCOUNTER — Telehealth: Payer: Self-pay

## 2015-07-21 DIAGNOSIS — R948 Abnormal results of function studies of other organs and systems: Secondary | ICD-10-CM

## 2015-07-21 NOTE — Telephone Encounter (Signed)
Attempted to return call to the patient's daughter two times at 09:54 AM and 14:48 , no answer , left a detailed message with call back information provided. Writer unaware of the reason for the phone call , Joylene John, APNP aware of being unable to reach the patient.

## 2015-07-22 NOTE — Telephone Encounter (Signed)
Call placed again to follow up on incoming call placed yesterday , attempted two times to contact the patient yesterday , uneventful. Did leave a voice message both times , spoke with the patient's daughter today due to her mother being spanish speaking only. Colletta Maryland states her mother was nauseated for two days but is "better" now , Probation officer instructed Colletta Maryland to call us again with any additional changes or concerns , further questions denied at this time .

## 2015-07-23 ENCOUNTER — Ambulatory Visit: Payer: Self-pay | Attending: Gynecologic Oncology | Admitting: Gynecologic Oncology

## 2015-07-23 ENCOUNTER — Encounter: Payer: Self-pay | Admitting: Gynecologic Oncology

## 2015-07-23 VITALS — BP 101/61 | HR 99 | Temp 97.6°F | Resp 18 | Ht <= 58 in | Wt 105.2 lb

## 2015-07-23 DIAGNOSIS — Z87442 Personal history of urinary calculi: Secondary | ICD-10-CM | POA: Insufficient documentation

## 2015-07-23 DIAGNOSIS — K5909 Other constipation: Secondary | ICD-10-CM

## 2015-07-23 DIAGNOSIS — Z9889 Other specified postprocedural states: Secondary | ICD-10-CM

## 2015-07-23 DIAGNOSIS — Z87410 Personal history of cervical dysplasia: Secondary | ICD-10-CM

## 2015-07-23 DIAGNOSIS — Z90710 Acquired absence of both cervix and uterus: Secondary | ICD-10-CM | POA: Insufficient documentation

## 2015-07-23 DIAGNOSIS — R338 Other retention of urine: Secondary | ICD-10-CM

## 2015-07-23 DIAGNOSIS — N319 Neuromuscular dysfunction of bladder, unspecified: Secondary | ICD-10-CM | POA: Insufficient documentation

## 2015-07-23 DIAGNOSIS — R339 Retention of urine, unspecified: Secondary | ICD-10-CM | POA: Insufficient documentation

## 2015-07-23 DIAGNOSIS — Z8541 Personal history of malignant neoplasm of cervix uteri: Secondary | ICD-10-CM | POA: Insufficient documentation

## 2015-07-23 DIAGNOSIS — C539 Malignant neoplasm of cervix uteri, unspecified: Secondary | ICD-10-CM

## 2015-07-23 DIAGNOSIS — R112 Nausea with vomiting, unspecified: Secondary | ICD-10-CM

## 2015-07-23 DIAGNOSIS — K5900 Constipation, unspecified: Secondary | ICD-10-CM

## 2015-07-23 MED ORDER — BARD FEMALE INTERMITTENT CATH MISC: 1.0000 | Freq: Two times a day (BID) | Status: DC

## 2015-07-23 MED ORDER — ONDANSETRON HCL 8 MG PO TABS: 8.0000 mg | ORAL_TABLET | Freq: Three times a day (TID) | ORAL | Status: DC | PRN

## 2015-07-23 MED ORDER — POLYETHYLENE GLYCOL 3350 17 G PO PACK: 17.0000 g | PACK | Freq: Every day | ORAL | Status: DC

## 2015-07-23 NOTE — Patient Instructions (Signed)
Plan to follow up with Dr. Denman George in three months or sooner if needed.  Plan to perform catheterization in the am when you wake up and in the evening around 5 or 6 pm.  You need to attempt to urinate before.  When you have nothing coming out, you can stop catheterizing yourself.    Cateterismo Intermitente Limpio, Mujeres (Clean Intermittent Catheterization, Female)  El cateterismo intermitente limpio (CIL) consiste en vaciar la orina de la vejiga con un tubo pequeo y flexible (catter). La vejiga es el rgano que almacena la [place]. Puede ser necesario drenar la vejiga con un catter si:  El flujo de [place] afuera de la vejiga o a travs del tracto urinario est obstruido.  Los msculos de la vejiga o los nervios no funcionan correctamente y no dejan que fluya normalmente la [place]. El vaciado de la vejiga con regularidad le ayudar a prevenir daos permanentes en la vejiga o en los riones.  SUMINISTROS PARA EL CIL  Usted necesitar:   Un tipo y tamao de catter especfico, segn las indicaciones de su mdico.  Gel lubricante, hidrosoluble, (si el catter no est pre-lubricado). No utilice un lubricante a base de aceite.  Un pao tibio y jabn o hisopos humedecidos.  Un recipiente para recoger la orina (si no utiliza el inodoro).  Un recipiente o bolsa para guardar el catter. CMO REALIZAR EL CIL  Siga estos pasos para una tcnica limpia:  1. Junte los suministros y colquelos cerca de su alcance. 2. Stacy Gardner bien sus manos con agua y [place]. 3. Pngase en una posicin cmoda. Las posiciones son:  Karlene Lineman adelante o hacia atrs en un inodoro, silla de ruedas, silla, o en el borde de la cama. Puede ser til para sentarse mirando hacia el frente con un espejo situado en una banqueta para ayudarle a ver la abertura de la uretra. O sentarse hacia atrs frente a un tocador con un espejo colocado entre la tapa y el asiento del inodoro para ayudarle a ver la abertura de Retail buyer.  Parada junto al inodoro con un pie en el borde del inodoro.  Acostada con la cabeza elevada sobre almohadas. 4. [place] piernas (si no est utilizando el inodoro). 5. Orine (si puede). 6. Coloque gel lubricante hidrosoluble en aproximadamente 2 pulgadas (5 cm) de la punta del catter (si el catter no est pre-lubricado). 7. Deje el catter Dwyane Luo abajo sobre una superficie limpia y seca al alcance. 8. Separe los labios vaginales. 9. Lave los labios con un pao tibio con jabn o toallitas pre-humedecidas. Lave de [employer]. 10. Mantenga los labios abiertos con [employer]. 11. Reljese 12. Inserte el catter suavemente en la abertura de la uretra en una direccin hacia arriba y [place] atrs hasta que la orina comienza a fluir, por lo general de 2 a 3 pulgadas (5 a 8 cm). 13. Cuando la orina comience a fluir, Probation officer de 1 pulgada (3 cm) ms. 14. Cuando la orina deje de fluir, presione o empuje suavemente los msculos abdominales inferiores para ayudar a vaciar la vejiga completamente. [place]. 24. Lave los labios y el rea genital. 17. Informe cualquier cambio en la orina a su mdico. 16. Deseche la orina. 19. Si est utilizando un catter de usos mltiples, lvelo segn las indicaciones de su mdico. [place]. Deje que se seque al aire. Guarde el catter en un recipiente o en una bolsa limpia  y [place]. [place]. INSTRUCCIONES PARA EL CUIDADO EN EL HOGAR   Beba entre 6 y [place] [employer] researcher. Evite la cafena. La cafena puede hacer que tenga que orinar con ms frecuencia y con ms [place].  Vace la vejiga cada 4 a 6 horas, o segn las indicaciones de su mdico.  Realice un CIL si usted tiene sntomas de exceso de orina en la vejiga (sobredistensin), y no Radiographer, therapeutic. Los sntomas de la sobredistensin son:  Agitacin.  Sudoracin o escalofros.  Dolor de  [place].  Rubor o palidez.  Extremidades fras.  Abdomen hinchado.  Deseche el catter de uso mltiple cuando est seco, quebradizo o turbio (por lo general despus de 1 semana de uso).  Tome los medicamentos que le indic el profesional que lo asiste. SOLICITE ATENCIN MDICA SI:   Tiene dificultad para realizar cualquiera de los pasos.  Pierde orina.  Siente dolor al [employer].  Observa sangre en la orina.  Tiene necesidad de vaciar la vejiga (evacuar) con frecuencia.  La orina es turbia o huele diferente.  Siente dolor en el abdomen.  Aparece una erupcin cutnea o tiene llagas. SOLICITE ATENCIN MDICA DE INMEDIATO SI:   Tiene fiebre o sntomas que persisten durante ms de 72 horas.  Tiene fiebre y los sntomas empeoran repentinamente.  El dolor se hace ms intenso.   Esta informacin no tiene Marine scientist el consejo del mdico. Asegrese de hacerle al mdico cualquier pregunta que tenga.   Document Released: 02/02/2012 Document Revised: 05/31/2014 Elsevier Interactive Patient Education [employer].

## 2015-07-23 NOTE — Progress Notes (Signed)
Follow Up Note: Gyn-Onc  Crystal [name] 45 y.o. female  CC:  Chief Complaint  Patient presents with  . Cervical Cancer   Assessment/Plan:  45 year old s/p robotic-assisted type III radical laparoscopic hysterectomy with bilateral salpingectomy and bilateral sentinel lymph node biopsy on 07/01/15.  No residual carcinoma on hysterectomy specimen.  Recommendation is for no further adjuvant therapy as she does not meet intermediate or high risk factors on pathology. I discussed with the patient (and translator) that there is a risk for recurrence which we will monitor with 3 monthly examinations. She is at risk for HPV related disease in the future and we will continue annual pap surveillance for this with hpv cotesting.  I discussed symptoms concerning for recurrence including vaginal bleeding or discharge, pelvic pain, cough, or lower extremity edema.  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 We have instructed her (with the aid of a translator) to intermittently self-catheterize herself. I recommend she do this first thing upon waking in the morning (after attempting to spontaneously void) and in the early evening (again after spontaneously attempting to void). I discussed the importance of keeping her bladder residuals low to prevent overdistension of the bladder and permanent damage to the detrusor muscle. I discussed that the natural history of this condition is for gradual improvement, and when she notes that she no longer gets substantial post void residual after cath'ing, she will be able to stop this practice.   I will see her back in 3 months.   HPI: Crystal [name] is a 45 year old female initially referred by Dr. 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  She had her first abnormal Pap smear on 02/26/2015 which was HGSIL. She then underwent colposcopic evaluation of the  cervix on 04/02/2015, which was benign in appearance. A biopsy from the ectocervix was benign and endocervical curetting showed CIN-3. As follow-up, she underwent a LEEP procedure on 05/12/2015. 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 Dimensions of the carcinoma were not included however there was a focus of dysplasia suspicious for invasive carcinoma involving the deep margin.  PET on 06/20/15: IMPRESSION: 1. No primary hypermetabolic primary cervical mass or evidence of nodal metastasis. 2. Focal hypermetabolism in the lateral left breast. No CT correlate. Consider correlation with diagnostic mammography and ultrasound to exclude otherwise occult breast lesion. 3. Probable left nephrolithiasis  On 07/01/15, she underwent a robotic-assisted type III radical laparoscopic hysterectomy with bilateral salpingoophorectomy and bilateral pelvic lymphadenectomy by Dr. Denman George.  Final pathology revealed: Diagnosis 1. Uterus +/- tubes/ovaries, neoplastic, cervix CERVIX WITH LOW GRADE SQUAMOUS INTRAEPITHELIAL LESION, CIN-1 NO RESIDUAL HIGH GRADE INTRAEPITHELIAL LESION OR INVASIVE NEOPLASM IDENTIFIED VAGINAL CUFF, PARAMETRIAL AND ALL RESECTION MARGINS ARE NEGATIVE FOR TUMOR ENDOMETRIAL POLYP AND INACTIVE ENDOMETRIUM MYOMETRIUM: NO PATHOLOGICAL ALTERATIONS BILATERAL FALLOPIAN TUBES: HISTOLOGICAL UNREMARKABLE 2. Lymph node, sentinel, biopsy, right external iliac ONE BENIGN LYMPH NODE (0/1) 3. Lymph node, sentinel, biopsy, left obturator ONE BENIGN LYMPH NODE (0/1)  She was re-admitted to the hospital on POD 7, 07/08/15, for persistent nausea, vomiting, dehydration, no BM, dyspnea.  CT CAP on 07/08/15 failed to demonstrate evidence for intraperitoneal abscess/GU or GI injury or pathology including no obstruction. UA and culture revealed a catheter associated UTI (e coli - pan sensitive) which was treated with cipro. She was discharged  home on 07/10/15 after IV hydration, aggressive  bowel regimen.  She failed a voiding trial during her hospitalization and the foley had to be replaced due to urinary retention.  Interval History:  She presented to the office on 07/15/15 for a repeat voiding trial. She passed this well and her foley was left out. Since being at home she has had persistent intermittent nausea and occasional emesis. She passes bowel movements and flatus. She has generalized abdominal pain and "difficulty" with urination - "have to push on my stomach to get urine out".  She denies fevers or chills. She has been extremely anxious about having metastatic lethal cancer.  Review of Systems  Constitutional: Feels well.  No fever, chills, early satiety, or unintentional weight loss or gain.  Cardiovascular: No chest pain, shortness of breath, or edema.  Pulmonary: No cough or wheeze.  Gastrointestinal: No nausea, vomiting, or diarrhea. No bright red blood per rectum or change in bowel movement.  Genitourinary: No frequency, urgency, or dysuria. No vaginal bleeding or discharge.  Musculoskeletal: No myalgia or joint pain. Neurologic: No weakness, numbness, or change in gait.  Psychology: No depression, anxiety, or insomnia.  Current Meds:  Outpatient Encounter Prescriptions as of 07/23/2015  Medication Sig  . alum & mag hydroxide-simeth (MAALOX/MYLANTA) 200-200-20 MG/5ML suspension Take 30 mLs by mouth every 4 (four) hours as needed for indigestion.  . ciprofloxacin (CIPRO) 500 MG tablet Take 1 tablet (500 mg total) by mouth 2 (two) times daily.  Marland Kitchen docusate sodium (COLACE) 100 MG capsule Take 1 capsule (100 mg total) by mouth 2 (two) times daily.  . fluconazole (DIFLUCAN) 100 MG tablet Take 1 tablet (100 mg total) by mouth once.  Marland Kitchen ibuprofen (ADVIL,MOTRIN) 200 MG tablet Take 400 mg by mouth every 6 (six) hours as needed for moderate pain. Reported on 05/22/2015  . ondansetron (ZOFRAN) 8 MG tablet Take 1 tablet (8 mg total)  by mouth every 8 (eight) hours as needed for nausea or vomiting.  Marland Kitchen OVER THE COUNTER MEDICATION Take 20 mLs by mouth 3 (three) times daily as needed (cough.). [employer]. - [place] Product.  Marland Kitchen oxyCODONE (OXY IR/ROXICODONE) 5 MG immediate release tablet Take 1-2 tablets (5-10 mg total) by mouth every 4 (four) hours as needed for severe pain.  . polyethylene glycol (MIRALAX / GLYCOLAX) packet Take 17 g by mouth daily.  . Probiotic Product (PROBIOTIC PO) Take 1 tablet by mouth every evening.  . sennosides-docusate sodium (SENOKOT-S) 8.6-50 MG tablet Take 1 tablet by mouth 2 (two) times daily.   No facility-administered encounter medications on file as of 07/23/2015.    Allergy: No Known Allergies  Social Hx:   Social History   Social History  . Marital Status: Widowed    Spouse Name: N/A  . Number of Children: N/A  . Years of Education: N/A   Occupational History  . Not on file.   Social History Main Topics  . Smoking status: Never Smoker   . Smokeless tobacco: Never Used  . Alcohol Use: No     Comment: occasionally  . Drug Use: No  . Sexual Activity: Yes    Birth Control/ Protection: Injection   Other Topics Concern  . Not on file   Social History Narrative    Past Surgical Hx:  Past Surgical History  Procedure Laterality Date  . Cesarean section      2 previous  . Cervical biopsy  w/ loop electrode excision      12'16  . Robotic assisted total hysterectomy Bilateral 07/01/2015    Procedure:  XI ROBOTIC ASSISTED RADICAL TYPE II HYSTERECTOMY, BILATERAL SALPINGECTOMY, SENTINAL LYMPH NODE BIOPSY;  Surgeon: Everitt Amber, MD;  Location: [facility];  Service: Gynecology;  Laterality: Bilateral;    Past Medical Hx:  Past Medical History  Diagnosis Date  . Cancer ([place])     cervical cancer dx.   . Kidney stones     cyst on kidney -no problems  . History of kidney stones     x1     Family Hx:  Family History  Problem Relation Age of Onset  . Hypertension Sister     Vitals:   Blood pressure 101/61, pulse 99, temperature 97.6 F (36.4 C), temperature source Oral, resp. rate 18, height 4\' 10"  (1.473 m), weight 105 lb 3.2 oz (47.718 kg), last menstrual period 06/07/2015, SpO2 100 %.  Physical Exam:  General: Well developed, well nourished female in no acute distress. Alert and oriented x 3.  Cardiovascular: Regular rate and rhythm. S1 and S2 normal.  Lungs: Clear to auscultation bilaterally. No wheezes/crackles/rhonchi noted.  Skin: No rashes or lesions present. Back: No CVA tenderness.  Abdomen: Abdomen soft, non-tender and non-obese. Active bowel sounds in all quadrants. No evidence of a fluid wave or abdominal masses.  Lap sites healing well without erythema or drainage.  Extremities: No bilateral cyanosis, edema, or clubbing.  Pelvic exam: normal vagina and vaginal cuff. No lesions, no blood, no masses.   Procedure: patient was instructed on in and out catheterization with the assistance of a translator.   30 minutes of direct face to face counseling time was spent with the patient. This included discussion about prognosis, therapy recommendations and postoperative side effects and are beyond the scope of routine postoperative care.  Donaciano Eva, MD  07/23/2015, 2:37 PM

## 2015-07-23 NOTE — Progress Notes (Signed)
Patient instructed on in and out catheterization with the assistance of an interpretor.  Her friend was also present.  She voided 350 cc without difficulty and 550 cc of urine out on in and out catheterization.  Urine clear, yellow.  Patient stating she feels much better.  Patient verbalizing understanding and no questions or concerns voiced.  Reportable signs and symptoms reviewed.  Supplies given including in and out cath kit, recording sheet, measuring hat, and gloves.

## 2015-07-24 ENCOUNTER — Ambulatory Visit
Admission: RE | Admit: 2015-07-24 | Discharge: 2015-07-24 | Disposition: A | Discharge status: Home / Self Care | Payer: No Typology Code available for payment source | Source: Ambulatory Visit | Attending: Obstetrics and Gynecology | Admitting: Obstetrics and Gynecology

## 2015-07-24 ENCOUNTER — Ambulatory Visit (HOSPITAL_COMMUNITY)
Admission: RE | Admit: 2015-07-24 | Discharge: 2015-07-24 | Disposition: A | Discharge status: Home / Self Care | Payer: Self-pay | Source: Ambulatory Visit | Attending: Obstetrics and Gynecology | Admitting: Obstetrics and Gynecology

## 2015-07-24 ENCOUNTER — Encounter: Payer: Self-pay | Admitting: Gynecologic Oncology

## 2015-07-24 ENCOUNTER — Encounter (HOSPITAL_COMMUNITY): Payer: Self-pay

## 2015-07-24 ENCOUNTER — Other Ambulatory Visit (HOSPITAL_COMMUNITY): Payer: Self-pay | Admitting: Obstetrics and Gynecology

## 2015-07-24 VITALS — BP 110/64 | Ht 60.0 in | Wt 106.0 lb

## 2015-07-24 DIAGNOSIS — Z1239 Encounter for other screening for malignant neoplasm of breast: Secondary | ICD-10-CM

## 2015-07-24 DIAGNOSIS — R948 Abnormal results of function studies of other organs and systems: Secondary | ICD-10-CM

## 2015-07-24 NOTE — Progress Notes (Signed)
Christine from [facility] called down to see what patient needs to do to apply for grants. She states patient needs assistance with medication and comes often for visits. I reviewed patient's chart to see if she was on active treatment and if so what type. I advised Altha Harm that I didn't see an active treatment plan and would let her speak with Darlena so the two of them could come up with a solution. Darlena spoke with Altha Harm and after Altha Harm approved the patient to apply for the grant, Darlena advised her that we need the patient's proof of income. Patient is uninsured. I reviewed hospital Financial Advocate notes and patient was not eligible for Emergency Medicaid after there screening.

## 2015-07-24 NOTE — Patient Instructions (Signed)
Educational materials on self breast awareness given. Discussed resources with Crystal [name] for her medications and supplies for in and out catheter. Told patient she will need to go to [facility] to speak with [place] financial advocate with her income information after appointment at the [facility]. Recommended that patient also speak with a Education officer, museum at the [employer]. Referred patient to the [facility] for diagnostic mammogram per recommendation. Appointment scheduled for Thursday, July 24, 2015 at 1040. Patient aware of appointment and will be there. Crystal [name] verbalized understanding.  [place]ann[place], Arvil Chaco, RN 10:23 AM

## 2015-07-24 NOTE — Progress Notes (Addendum)
No complaints today.   Pap Smear:  Pap smear not completed today. Last Pap smear was 02/26/2015 at the [facility] and HGSIL. 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 Patient had a complete hysterectomy 07/01/2015 for cervical cancer. Patient is being followed by GYN Oncology at the [facility] for follow up. Per patient her most recent Pap smear is the only abnormal Pap smear she has had. Last Pap smear result is in EPIC.  Physical exam: Breasts Breasts symmetrical. No skin abnormalities bilateral breasts. No nipple retraction bilateral breasts. No nipple discharge bilateral breasts. No lymphadenopathy. No lumps palpated bilateral breasts. No complaints of pain or tenderness on exam. Referred patient to the [facility] for diagnostic mammogram per recommendation. Appointment scheduled for Thursday, July 24, 2015 at 1040.  Pelvic/Bimanual No Pap smear completed today since last Pap smear was 02/26/2015. Pap smear not indicated per BCCCP guidelines.   Smoking History: Patient has never smoked.  Patient Navigation: Patient education provided. Access to services provided for patient through [facility] program. Spanish interpreter provided. Approved patient for [place] to help with cost of medications and catheter supplies. Told patient to go to [facility] to speak with Financial Advocate after appointment at the [facility] with income information and recommended patient talk to the Education officer, museum.   Used Spanish interpreter [employer].

## 2015-07-24 NOTE — Addendum Note (Signed)
Encounter addended by: Loletta Parish, RN on: 07/24/2015 11:12 AM<BR>     Documentation filed: Notes Section

## 2015-07-24 NOTE — Progress Notes (Signed)
100% discount has already been applied to patient's account.

## 2015-07-28 ENCOUNTER — Other Ambulatory Visit (HOSPITAL_COMMUNITY): Payer: Self-pay | Admitting: *Deleted

## 2015-07-28 ENCOUNTER — Other Ambulatory Visit: Payer: Self-pay | Admitting: Obstetrics and Gynecology

## 2015-07-28 DIAGNOSIS — R928 Other abnormal and inconclusive findings on diagnostic imaging of breast: Secondary | ICD-10-CM

## 2015-08-01 ENCOUNTER — Encounter (HOSPITAL_COMMUNITY): Payer: Self-pay | Admitting: *Deleted

## 2015-08-05 ENCOUNTER — Ambulatory Visit
Admission: RE | Admit: 2015-08-05 | Discharge: 2015-08-05 | Disposition: A | Discharge status: Home / Self Care | Payer: No Typology Code available for payment source | Source: Ambulatory Visit | Attending: Obstetrics and Gynecology | Admitting: Obstetrics and Gynecology

## 2015-08-05 DIAGNOSIS — R928 Other abnormal and inconclusive findings on diagnostic imaging of breast: Secondary | ICD-10-CM

## 2015-08-05 MED ORDER — GADOBENATE DIMEGLUMINE 529 MG/ML IV SOLN
13.0000 mL | Freq: Once | INTRAVENOUS | Status: AC | PRN
  Administered 2015-08-05: 13 mL via INTRAVENOUS

## 2015-08-06 ENCOUNTER — Telehealth: Payer: Self-pay

## 2015-08-06 DIAGNOSIS — R112 Nausea with vomiting, unspecified: Secondary | ICD-10-CM

## 2015-08-06 DIAGNOSIS — Z9889 Other specified postprocedural states: Secondary | ICD-10-CM

## 2015-08-06 DIAGNOSIS — C539 Malignant neoplasm of cervix uteri, unspecified: Secondary | ICD-10-CM

## 2015-08-06 MED ORDER — ONDANSETRON HCL 8 MG PO TABS: 8.0000 mg | ORAL_TABLET | Freq: Three times a day (TID) | ORAL | Status: DC | PRN

## 2015-08-06 MED ORDER — TRAMADOL HCL 50 MG PO TABS: 50.0000 mg | ORAL_TABLET | Freq: Three times a day (TID) | ORAL | Status: DC | PRN

## 2015-08-06 NOTE — Telephone Encounter (Signed)
Incoming call from the patient's daughter Colletta [place] stating her mother is complaining of her stomach "buring " mainly at night with episodes of nausea. Patient states that her mother is eating , drinking and has daily bowel movements. Patient also states she is still tired and weak intermittently , writer informed the patient's daughter that this is a normal process after a hysterectomy . Also inquired how the self straight cath is going , Colletta Maryland states her mother is only straight cath every "three days" with an output of 200-300 cc . Patient also requesting refill on Zofran and Ultram . Melissa Cross , APNP updated , orders received to instruct the patient to straight cath two times daily , once in the morning after she has urinated and at HS prior to going to bed and record the output. Patient denis any urinary burning or vaginal bleeding at this time. Patient also instructed to be evaluated by her PCP for the "burning" in her stomach and the nightly "chills". Patient states understanding and plans to schedule an appointment with him today. Writer to place follow up call tomorrow to re-evaluate symptoms and to see when the patient patient has an appointment with her PCP.

## 2015-08-07 ENCOUNTER — Ambulatory Visit: Payer: Self-pay | Attending: Physician Assistant | Admitting: Physician Assistant

## 2015-08-07 ENCOUNTER — Encounter: Payer: Self-pay | Admitting: Physician Assistant

## 2015-08-07 DIAGNOSIS — N390 Urinary tract infection, site not specified: Secondary | ICD-10-CM | POA: Insufficient documentation

## 2015-08-07 DIAGNOSIS — C539 Malignant neoplasm of cervix uteri, unspecified: Secondary | ICD-10-CM | POA: Insufficient documentation

## 2015-08-07 DIAGNOSIS — R112 Nausea with vomiting, unspecified: Secondary | ICD-10-CM | POA: Insufficient documentation

## 2015-08-07 DIAGNOSIS — T83511A Infection and inflammatory reaction due to indwelling urethral catheter, initial encounter: Secondary | ICD-10-CM

## 2015-08-07 DIAGNOSIS — K219 Gastro-esophageal reflux disease without esophagitis: Secondary | ICD-10-CM | POA: Insufficient documentation

## 2015-08-07 DIAGNOSIS — R338 Other retention of urine: Secondary | ICD-10-CM

## 2015-08-07 DIAGNOSIS — R339 Retention of urine, unspecified: Secondary | ICD-10-CM | POA: Insufficient documentation

## 2015-08-07 DIAGNOSIS — Z87442 Personal history of urinary calculi: Secondary | ICD-10-CM | POA: Insufficient documentation

## 2015-08-07 DIAGNOSIS — T8351XA Infection and inflammatory reaction due to indwelling urinary catheter, initial encounter: Secondary | ICD-10-CM

## 2015-08-07 DIAGNOSIS — Z79899 Other long term (current) drug therapy: Secondary | ICD-10-CM | POA: Insufficient documentation

## 2015-08-07 DIAGNOSIS — Z9889 Other specified postprocedural states: Secondary | ICD-10-CM

## 2015-08-07 DIAGNOSIS — R1013 Epigastric pain: Secondary | ICD-10-CM | POA: Insufficient documentation

## 2015-08-07 LAB — POCT URINALYSIS DIPSTICK
Bilirubin, UA: NEGATIVE
Glucose, UA: NEGATIVE
Ketones, UA: NEGATIVE
Nitrite, UA: POSITIVE
PH UA: 7.5
SPEC GRAV UA: 1.01
UROBILINOGEN UA: 0.2

## 2015-08-07 MED ORDER — CIPROFLOXACIN HCL 500 MG PO TABS: 500.0000 mg | ORAL_TABLET | Freq: Two times a day (BID) | ORAL | Status: DC

## 2015-08-07 MED ORDER — ONDANSETRON HCL 8 MG PO TABS: 8.0000 mg | ORAL_TABLET | Freq: Three times a day (TID) | ORAL | Status: DC | PRN

## 2015-08-07 MED ORDER — BARD FEMALE INTERMITTENT CATH MISC: 1.0000 | Freq: Two times a day (BID) | Status: DC

## 2015-08-07 MED ORDER — RANITIDINE HCL 150 MG PO CAPS: 150.0000 mg | ORAL_CAPSULE | Freq: Two times a day (BID) | ORAL | Status: DC

## 2015-08-07 MED FILL — ONDANSETRON HCL 8 MG TABLET: 8 | 13 days supply | Qty: 40 | Fill #0

## 2015-08-07 MED FILL — ?CIPROFLOXACIN HCL 500MG TA: 500 | 6 days supply | Qty: 12 | Fill #0

## 2015-08-07 MED FILL — raNITIdine HCL 150 MG TABS: 150 | 30 days supply | Qty: 60 | Fill #0

## 2015-08-07 NOTE — Progress Notes (Signed)
Patient's here for stomach burning, with feeling of nausea.   Patient states she's having a constant burning sensation in upper abd x77mo ago. Pain rated a 7-8/10.  Nausea feeling, and feeling dizzy when she stands up. This been going on x27mo now.  Pain reports pain in shoulders that's radiates down to her lower back since post operation on 07/01/15.

## 2015-08-07 NOTE — Progress Notes (Signed)
Patient ID: Crystal [name], female   DOB: Aug 07, 1970, 45 y.o.   MRN: [id number]   Crystal [name], is a 45 y.o. female  [id number]  [id number]  DOB - 04-05-1971  Chief Complaint  Patient presents with  . Abdominal Pain  . Nausea        Subjective:   Crystal [name] is a 45 y.o. female here today for midepigastric burning, nausea, and occasional vomiting since having surgery 07/01/2015. s/p robotic-assisted type III radical laparoscopic hysterectomy with bilateral salpingectomy and bilateral sentinel lymph node biopsy on 07/01/15. Since the surgery, she has been experiencing incomplete voiding and urinary retention with recent UTI.  She is here today primarily because of the midepigastric burning and persistent nausea. With her current symptoms, she denies fever.  History is via interpreter and chart review. She is here to establish with [place] as her PCP.  She has been to her post op f/up appointments.  She has been directed to in and out cath in the morning and evening to remove residual urine volume.  She denies urinary frequency or dysuria.   Patient has No headache, No chest pain,  No new weakness tingling or numbness, No Cough - SOB.  ROS neg otherwise.  ALLERGIES: No Known Allergies  PAST MEDICAL HISTORY: Past Medical History  Diagnosis Date  . Cancer ([place])     cervical cancer dx.   . Kidney stones     cyst on kidney -no problems  . History of kidney stones     x1     MEDICATIONS AT HOME: Prior to Admission medications   Medication Sig Start Date End Date Taking? Authorizing Provider  ibuprofen (ADVIL,MOTRIN) 200 MG tablet Take 400 mg by mouth every 6 (six) hours as needed for moderate pain. Reported on 05/22/2015   Yes Historical Provider, MD  ondansetron (ZOFRAN) 8 MG tablet Take 1 tablet (8 mg total) by mouth every 8 (eight) hours as needed for nausea or vomiting. 08/07/15  Yes Karna Abed M Kacelyn Rowzee, PA-C  OVER THE COUNTER MEDICATION Take 20 mLs by  mouth 3 (three) times daily as needed (cough.). Reported on 07/24/2015   Yes Historical Provider, MD  traMADol (ULTRAM) 50 MG tablet Take 1 tablet (50 mg total) by mouth every 8 (eight) hours as needed. 08/06/15  Yes Dorothyann Gibbs, NP  alum & mag hydroxide-simeth (MAALOX/MYLANTA) 200-200-20 MG/5ML suspension Take 30 mLs by mouth every 4 (four) hours as needed for indigestion. Patient not taking: Reported on 08/07/2015 07/10/15   Everitt Amber, MD  Catheters (BARD FEMALE INTERMITTENT CATH) MISC 1 Device by Does not apply route 2 (two) times daily. 08/07/15   Argentina Donovan, PA-C  ciprofloxacin (CIPRO) 500 MG tablet Take 1 tablet (500 mg total) by mouth 2 (two) times daily. 08/07/15   Argentina Donovan, PA-C  docusate sodium (COLACE) 100 MG capsule Take 1 capsule (100 mg total) by mouth 2 (two) times daily. Patient not taking: Reported on 08/07/2015 07/10/15   Everitt Amber, MD  fluconazole (DIFLUCAN) 100 MG tablet Take 1 tablet (100 mg total) by mouth once. Patient not taking: Reported on 07/24/2015 07/14/15   Dorothyann Gibbs, NP  oxyCODONE (OXY IR/ROXICODONE) 5 MG immediate release tablet Take 1-2 tablets (5-10 mg total) by mouth every 4 (four) hours as needed for severe pain. Patient not taking: Reported on 08/07/2015 07/14/15   Dorothyann Gibbs, NP  polyethylene glycol (MIRALAX / GLYCOLAX) packet Take 17 g by mouth daily. Patient not taking: Reported on 08/07/2015 07/23/15  Dorothyann Gibbs, NP  Probiotic Product (PROBIOTIC PO) Take 1 tablet by mouth every evening. Reported on 08/07/2015    Historical Provider, MD  ranitidine (ZANTAC) 150 MG capsule Take 1 capsule (150 mg total) by mouth 2 (two) times daily. 08/07/15   Argentina Donovan, PA-C  sennosides-docusate sodium (SENOKOT-S) 8.6-50 MG tablet Take 1 tablet by mouth 2 (two) times daily. Reported on 08/07/2015    Historical Provider, MD     Objective:   Filed Vitals:   08/07/15 1023  BP: 106/67  Pulse: 82  Temp: 98.4 F (36.9 C)  TempSrc: Oral  Resp: 16    Height: 4\' 10"  (1.473 m)  Weight: 105 lb (47.628 kg)  SpO2: 97%    Exam General appearance : Awake, alert, not in any distress. Speech Clear. Not toxic looking HEENT: Atraumatic and Normocephalic, pupils equally reactive to light and accomodation Neck: supple, no JVD. No cervical lymphadenopathy.  Chest:Good air entry bilaterally, no added sounds  CVS: S1 S2 regular, no murmurs.  Abdomen: Bowel sounds present, Non tender and not distended with no gaurding, rigidity or rebound. Extremities: B/L Lower Ext shows no edema, both legs are warm to touch Neurology: Awake alert, and oriented X 3, CN II-XII intact, Non focal Skin:No Rash  Data Review Lab Results  Component Value Date   HGBA1C 5.5 04/04/2015     Assessment & Plan   1. Non-intractable vomiting with nausea, unspecified vomiting type -non-acute abdomen ?GERD-will try zantac 150mg  bid to see if the burning pain and nausea improve.  Vomiting has been infrequent and will also order zofran.   2. Abdominal pain, epigastric-?GERD  3. Acute urinary retention - Catheters (BARD FEMALE INTERMITTENT CATH) MISC; 1 Device by Does not apply route 2 (two) times daily.  Dispense: 15 each; Refill: 0 - Urine culture - POCT urinalysis dipstick Continue with in/out cath as directed prn Cipro 500mg  bid(last culture showed Klebsiella with sensitivity to Cipro)  4. Malignant neoplasm of cervix, unspecified site [facility]) -Continue f/up with Dr. Denman George  5. Squamous cell carcinoma of cervix (HCC) Cont f/up with Dr. Denman George   6. Urinary tract infection associated with catheterization of urinary tract, initial encounter - ciprofloxacin (CIPRO) 500 MG tablet; Take 1 tablet (500 mg total) by mouth 2 (two) times daily.  Dispense: 12 tablet; Refill: 0  Patient have been counseled extensively about nutrition and exercise  Return in about 2 weeks (around 08/21/2015) for Recheck UTI and epigastric pain ; establish care.  The patient was given clear  instructions to go to ER or return to medical center if symptoms don't improve, worsen or new problems develop. The patient verbalized understanding. The patient was told to call to get lab results if they haven't heard anything in the next week.     Freeman Caldron, PA-C [facility] and [place], [place]   08/07/2015, 1:07 PM

## 2015-08-07 NOTE — Patient Instructions (Addendum)
Drink 80 ounces of water daily  Infeccin urinaria  (Urinary Tract Infection)  La infeccin urinaria puede ocurrir en Clinical cytogeneticist del tracto urinario. El tracto urinario es un sistema de drenaje del cuerpo por el que se eliminan los desechos y el exceso de [place]. El tracto urinario est formado por dos riones, dos urteres, la vejiga y Geologist, engineering. Los riones son rganos que tienen forma de frijol. Cada rin tiene aproximadamente el tamao del puo. Estn situados debajo de las [place], uno a cada lado de la columna vertebral CAUSAS  La causa de la infeccin son los microbios, que son organismos microscpicos, que incluyen hongos, virus, y bacterias. Estos organismos son tan pequeos que slo pueden verse a travs del microscopio. Las bacterias son los microorganismos que ms comnmente causan infecciones urinarias.  SNTOMAS  Los sntomas pueden variar segn la edad y el sexo del paciente y por la ubicacin de la infeccin. Los sntomas en las mujeres jvenes incluyen la necesidad frecuente e intensa de orinar y una sensacin dolorosa de ardor en la vejiga o en la uretra durante la miccin. Las mujeres y los hombres mayores podrn sentir cansancio, temblores y debilidad y Arts development officer musculares y Social research officer, government abdominal. Si tiene [place], puede significar que la infeccin est en los riones. Otros sntomas son dolor en la espalda o en los lados debajo de las [place], nuseas y vmitos.  DIAGNSTICO  Para diagnosticar una infeccin urinaria, el mdico le preguntar acerca de sus sntomas. [employer] una [place] de [place]. La muestra de orina se analiza para Hydrographic surveyor bacterias y glbulos blancos de Herbalist. Los glbulos blancos se forman en el organismo para ayudar a Radio broadcast assistant las infecciones.  TRATAMIENTO  Por lo general, las infecciones urinarias pueden tratarse con medicamentos. Debido a que la [employer] de las infecciones son causadas por bacterias, por lo general pueden tratarse con  antibiticos. La eleccin del antibitico y la duracin del tratamiento depender de sus sntomas y el tipo de bacteria causante de la infeccin.  INSTRUCCIONES PARA EL CUIDADO EN EL HOGAR   Si le recetaron antibiticos, tmelos exactamente como su mdico le indique. Termine el medicamento aunque se sienta mejor despus de haber tomado slo algunos.  Beba gran cantidad de lquido para mantener la orina de tono claro o color amarillo plido.  Evite la cafena, el t y las bebidas gaseosas. Estas sustancias irritan la vejiga.  Vaciar la vejiga con frecuencia. Evite retener la orina durante largos perodos.  Vace la vejiga antes y despus de Clinical biochemist.  Despus de mover el intestino, las mujeres deben higienizarse la regin perineal desde adelante hacia atrs. Use slo un papel tissue por vez. SOLICITE ATENCIN MDICA SI:   Siente dolor en la espalda.  Le sube la fiebre.  Los sntomas no mejoran luego de 3 das. SOLICITE ATENCIN MDICA DE INMEDIATO SI:   Siente dolor intenso en la espalda o en la zona inferior del abdomen.  Comienza a sentir escalofros.  Tiene nuseas o vmitos.  Tiene una sensacin continua de quemazn o molestias al [employer]. ASEGRESE DE QUE:   Comprende estas instrucciones.  Controlar su enfermedad.  Solicitar ayuda de inmediato si no mejora o empeora.   Esta informacin no tiene Marine scientist el consejo del mdico. Asegrese de hacerle al mdico cualquier pregunta que tenga.   Document Released: 02/17/2005 Document Revised: 02/02/2012 Elsevier Interactive Patient Education [employer].

## 2015-08-08 ENCOUNTER — Other Ambulatory Visit: Payer: Self-pay | Admitting: Physician Assistant

## 2015-08-08 ENCOUNTER — Telehealth: Payer: Self-pay

## 2015-08-08 DIAGNOSIS — R338 Other retention of urine: Secondary | ICD-10-CM

## 2015-08-08 DIAGNOSIS — C539 Malignant neoplasm of cervix uteri, unspecified: Secondary | ICD-10-CM

## 2015-08-08 MED ORDER — BARD FEMALE INTERMITTENT CATH MISC: 1.0000 | Freq: Two times a day (BID) | Status: DC

## 2015-08-08 NOTE — Telephone Encounter (Signed)
Orders received from [facility] to contact the patient and update that she can pick up the self cath at [facility] : [phone] and the cost is about $30.00 for a box of 30 caths. Patient's daughter Colletta Maryland was contacted and updated with the location to pick up the self catheters . Colletta Maryland states understanding denies further questions at this time , will call with any changes , questions or concerns.

## 2015-08-10 LAB — URINE CULTURE: Colony Count: >=100000

## 2015-08-18 ENCOUNTER — Telehealth: Payer: Self-pay | Admitting: *Deleted

## 2015-08-18 NOTE — Telephone Encounter (Signed)
[id number] PI  Information given to patient after date of birth verified.  Patient finished antibiotics and states she is better.

## 2015-08-18 NOTE — Telephone Encounter (Signed)
-----   Message from Maren Reamer, MD sent at 08/13/2015  3:24 PM EDT ----- Please call patient and ask how her urinary tract infection is? The antibiotics started for her urinary tract infection is very effective for the organism she has (staph coag neg). Tell her to finish the course of antibiotics so resistance wont occur making it harder to treat her urinary tract infection.  This organism she has has MANY resistance to other different antibiotics already. thanks

## 2015-08-20 ENCOUNTER — Telehealth: Payer: Self-pay

## 2015-08-20 NOTE — Telephone Encounter (Signed)
Orders received to contact the patient to follow up on self catheterization output . Attempted to contact the patient , uneventful , no answer, left a detailed message with call back requested. Our contact information was provided.

## 2015-08-21 ENCOUNTER — Other Ambulatory Visit: Payer: Self-pay | Admitting: Obstetrics and Gynecology

## 2015-08-21 DIAGNOSIS — N63 Unspecified lump in unspecified breast: Secondary | ICD-10-CM

## 2015-08-22 ENCOUNTER — Telehealth (HOSPITAL_COMMUNITY): Payer: Self-pay | Admitting: *Deleted

## 2015-08-22 NOTE — Telephone Encounter (Signed)
Attempted to call patient with interpreter Irwing Madrid to discuss recommended follow up from breast MRI. No one answered the phone. Left message for patient to call me back.

## 2015-08-25 ENCOUNTER — Telehealth (HOSPITAL_COMMUNITY): Payer: Self-pay | Admitting: *Deleted

## 2015-08-25 ENCOUNTER — Ambulatory Visit: Payer: Self-pay | Attending: Internal Medicine | Admitting: Internal Medicine

## 2015-08-25 ENCOUNTER — Encounter: Payer: Self-pay | Admitting: Internal Medicine

## 2015-08-25 VITALS — BP 101/67 | HR 83 | Temp 97.9°F | Resp 18 | Ht <= 58 in | Wt 104.6 lb

## 2015-08-25 DIAGNOSIS — N6489 Other specified disorders of breast: Secondary | ICD-10-CM | POA: Insufficient documentation

## 2015-08-25 DIAGNOSIS — Z79899 Other long term (current) drug therapy: Secondary | ICD-10-CM | POA: Insufficient documentation

## 2015-08-25 DIAGNOSIS — K5900 Constipation, unspecified: Secondary | ICD-10-CM

## 2015-08-25 DIAGNOSIS — Z8541 Personal history of malignant neoplasm of cervix uteri: Secondary | ICD-10-CM | POA: Insufficient documentation

## 2015-08-25 DIAGNOSIS — Z87442 Personal history of urinary calculi: Secondary | ICD-10-CM | POA: Insufficient documentation

## 2015-08-25 DIAGNOSIS — R1084 Generalized abdominal pain: Secondary | ICD-10-CM

## 2015-08-25 DIAGNOSIS — N649 Disorder of breast, unspecified: Secondary | ICD-10-CM

## 2015-08-25 DIAGNOSIS — C539 Malignant neoplasm of cervix uteri, unspecified: Secondary | ICD-10-CM

## 2015-08-25 LAB — POCT URINALYSIS DIPSTICK
BILIRUBIN UA: NEGATIVE
GLUCOSE UA: NEGATIVE
KETONES UA: NEGATIVE
Leukocytes, UA: NEGATIVE
NITRITE UA: NEGATIVE
PH UA: 6
Protein, UA: NEGATIVE
RBC UA: NEGATIVE
Spec Grav, UA: 1.015
Urobilinogen, UA: 0.2

## 2015-08-25 MED ORDER — SACCHAROMYCES BOULARDII 250 MG PO CAPS: 250.0000 mg | ORAL_CAPSULE | Freq: Two times a day (BID) | ORAL | Status: DC

## 2015-08-25 MED ORDER — TRAMADOL HCL 50 MG PO TABS: 50.0000 mg | ORAL_TABLET | Freq: Three times a day (TID) | ORAL | Status: DC | PRN

## 2015-08-25 MED FILL — traMADol HCL 50 MG TABS: 50 | 10 days supply | Qty: 30 | Fill #0

## 2015-08-25 NOTE — Progress Notes (Signed)
Crystal [name], is a 45 y.o. female  [id number]  [id number]:[id number]  DOB - [birth year]  CC:  Chief Complaint  Patient presents with  . Follow-up    Stomach Pain       HPI: Crystal [name] is a 45 y.o. female here today to establish medical care. Pt seen today on f/u. Since last seen in our clinic on 3/16, she is doing well, denies any dysuria/burning sensation.  States she stopped taking the cipro after 2-3 doses b/c it was making her stomach very upset.  She denies any urinary frequency at this time, and has been able to void w/o using the catheter.  States gets intermant sharp pains in midepigastrin at times, "deep", and also dull abd aches diffusely when she is lying in bed.  C/o of constipation, but not taking senokot, using "natural foods."  Denies any weightloss/gain.  She understands she needs to get breast bx, but has not set up and doesn't know why she needs it done.   Patient has No headache, No chest pain, No abdominal pain - No Nausea, No new weakness tingling or numbness, No Cough - SOB.  Spanish interpreter via [place] present.  No Known Allergies Past Medical History  Diagnosis Date  . Cancer ([place])     cervical cancer dx.   . Kidney stones     cyst on kidney -no problems  . History of kidney stones     x1    Current Outpatient Prescriptions on File Prior to Visit  Medication Sig Dispense Refill  . Catheters (BARD FEMALE INTERMITTENT CATH) MISC 1 Device by Does not apply route 2 (two) times daily. 15 each 1  . Probiotic Product (PROBIOTIC PO) Take 1 tablet by mouth every evening. Reported on 08/07/2015    . ranitidine (ZANTAC) 150 MG capsule Take 1 capsule (150 mg total) by mouth 2 (two) times daily. 60 capsule 1  . ibuprofen (ADVIL,MOTRIN) 200 MG tablet Take 400 mg by mouth every 6 (six) hours as needed for moderate pain. Reported on 08/25/2015    . ondansetron (ZOFRAN) 8 MG tablet Take 1 tablet (8 mg total) by mouth every 8 (eight) hours as needed for  nausea or vomiting. (Patient not taking: Reported on 08/25/2015) 40 tablet 0  . oxyCODONE (OXY IR/ROXICODONE) 5 MG immediate release tablet Take 1-2 tablets (5-10 mg total) by mouth every 4 (four) hours as needed for severe pain. (Patient not taking: Reported on 08/07/2015) 40 tablet 0  . sennosides-docusate sodium (SENOKOT-S) 8.6-50 MG tablet Take 1 tablet by mouth 2 (two) times daily. Reported on 08/25/2015     No current facility-administered medications on file prior to visit.   Family History  Problem Relation Age of Onset  . Hypertension Sister    Social History   Social History  . Marital Status: Widowed    Spouse Name: N/A  . Number of Children: N/A  . Years of Education: N/A   Occupational History  . Not on file.   Social History Main Topics  . Smoking status: Never Smoker   . Smokeless tobacco: Never Used  . Alcohol Use: No     Comment: occasionally  . Drug Use: No  . Sexual Activity: Yes    Birth Control/ Protection: Surgical   Other Topics Concern  . Not on file   Social History Narrative    Review of Systems: Constitutional: Negative for fever, chills, diaphoresis, activity change, appetite change and fatigue. HENT: Negative for ear pain, nosebleeds, congestion, facial swelling,  rhinorrhea, neck pain, neck stiffness and ear discharge.  Eyes: Negative for pain, discharge, redness, itching and visual disturbance. Respiratory: Negative for cough, choking, chest tightness, shortness of breath, wheezing and stridor.  Cardiovascular: Negative for chest pain, palpitations and leg swelling. Gastrointestinal: Negative for abdominal distention.  +intermittant sharp pains in epigastrin, doesn't feel like her heart burn, and also diffuse abd pains described as dull ache at night when lying down, +constipation. Genitourinary: Negative for dysuria, urgency, frequency, hematuria, flank pain, decreased urine volume, difficulty urinating and dyspareunia.   Currently not using the  self cath anylonger. Musculoskeletal: Negative for back pain, joint swelling, arthralgia and gait problem. Neurological: Negative for dizziness, tremors, seizures, syncope, facial asymmetry, speech difficulty, weakness, light-headedness, numbness and headaches.  Hematological: Negative for adenopathy. Does not bruise/bleed easily. Psychiatric/Behavioral: Negative for hallucinations, behavioral problems, confusion, dysphoric mood, decreased concentration and agitation.    Objective:   Filed Vitals:   08/25/15 1000  BP: 101/67  Pulse: 83  Temp: 97.9 F (36.6 C)  Resp: 18    Physical Exam: Constitutional: Patient appears well-developed and well-nourished. No distress. aaox 3, sitting in chair comfortably. HENT: Normocephalic, atraumatic, External right and left ear normal. Oropharynx is clear and moist.  bilateral TMS clear, nares nml,   Eyes: Conjunctivae and EOM are normal. PERRL, no scleral icterus. Neck: Normal ROM. Neck supple. No JVD. No tracheal deviation. No thyromegaly. CVS: RRR, S1/S2 +, no murmurs, no gallops, no carotid bruit.    Pulmonary: Effort and breath sounds normal, no stridor, rhonchi, wheezes, rales.  Abdominal: Soft. BS +, no distension, tenderness, rebound or guarding.  No reproducible pain. Musculoskeletal: Normal range of motion. No edema and no tenderness.   bilat LE no edema. Lymphadenopathy: No lymphadenopathy noted, cervical Neuro: Alert. Normal reflexes, muscle tone coordination. No cranial nerve deficit grossly. Skin: Skin is warm and dry. No rash noted. Not diaphoretic. No erythema. No pallor. Psychiatric: Normal mood and affect. Behavior, judgment, thought content normal.  Lab Results  Component Value Date   WBC 8.2 07/10/2015   HGB 12.3 07/10/2015   HCT 37.1 07/10/2015   MCV 97.1 07/10/2015   PLT 326 07/10/2015   Lab Results  Component Value Date   CREATININE 0.50 07/10/2015   BUN 8 07/10/2015   NA 137 07/10/2015   K 3.8 07/10/2015   CL 110  07/10/2015   CO2 19* 07/10/2015    Lab Results  Component Value Date   HGBA1C 5.5 04/04/2015   Lipid Panel     Component Value Date/Time   CHOL 193 04/04/2015 1110   TRIG 78 04/04/2015 1110   HDL 52 04/04/2015 1110   CHOLHDL 3.7 04/04/2015 1110   VLDL 16 04/04/2015 1110   LDLCALC 125 04/04/2015 1110       Assessment and plan:   1. 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 - Urinalysis Dipstick today neg.  2. Bilateral Breast lesion noted on MRI - dw pt results and recd for bilateral MRI bx. - [facility]/6 at 7am, stressed the importance of getting bx to r/o malignancy.  3. Meg intermant sharp pains w/ diffuse abd ache, ?constipation/obstipation vs post surgical pain - ultram renewed, stressed high fiber diet. - trial florastor/Activia yogurt for possible IBS.   Return in about 3 months (around 11/24/2015).  The patient was given clear instructions to go  to ER or return to medical center if symptoms don't improve, worsen or new problems develop. The patient verbalized understanding. The patient was told to call to get lab results if they haven't heard anything in the next week.      Maren Reamer, MD, [place], [place]   08/25/2015, 10:42 AM

## 2015-08-25 NOTE — Progress Notes (Signed)
Patient is here for ED FU for Stomach Pain  Patient complains of sharp intermittent pains currently scaled at a 5.   Patient declined the flu shot today.  Patient has only taken Omeprazole. Patient also had pediasure for breakfast this morning.  Patient is requesting a refill on Tramadol.

## 2015-08-25 NOTE — Telephone Encounter (Signed)
Called patient with Spanish interpreter Crystal [name] to let her that the Radiologist is recommending a MR biopsy and that someone from [facility] will call to schedule an appointment. Patient stated she has already been notified and has an appointment scheduled for 08/28/2015. Patient verbalized understanding.

## 2015-08-25 NOTE — Patient Instructions (Addendum)
* appt for MRI Breast biopsy   Dieta rica en fibra (High-Fiber Diet) La fibra, tambin llamada fibra dietaria, es un tipo de carbohidrato que se encuentra en las frutas, las verduras, los cereales integrales y los frijoles. Una dieta rica en fibra puede tener muchos beneficios para la salud. El mdico puede recomendar una dieta rica en fibra para ayudar a:  Contractor. La fibra puede hacer que defeque con ms frecuencia.  Disminuir el nivel de colesterol.  [place]utton hemorroides, la diverticulosis no complicada o el sndrome del intestino irritable.  Evitar comer en exceso como parte de un plan para bajar de peso.  Evitar cardiopatas, la diabetes tipo 2 y ciertos cnceres. EN QU CONSISTE EL PLAN? El consumo diario recomendado de fibra incluye lo siguiente:  38gramos para hombres menores de 58 aos.  30gramos para hombres mayores de 50 aos.  25gramos para mujeres menores de 50 aos.  21gramos para mujeres mayores de 50 aos. Puede lograr el consumo diario recomendado de fibra si come una variedad de frutas, verduras, cereales y frijoles. El mdico tambin puede recomendar un suplemento de fibra si no es posible obtener suficiente fibra a travs de la dieta. QU DEBO SABER ACERCA DE Crystal [name]?  La eficacia de los suplementos de [place] no ha sido estudiada [place], de modo que es mejor obtener fibra a travs de los alimentos.  Verifique siempre el contenido de fibra en la etiqueta de informacin nutricional de los alimentos preenvasados. Busque alimentos que contengan al menos 5gramos de fibra por porcin.  Consulte al nutricionista si tiene preguntas sobre algunos alimentos especficos relacionados con su enfermedad, especialmente si estos alimentos no se mencionan a continuacin.  Aumente el consumo diario de fibra en forma gradual. Aumentar demasiado rpido el consumo de fibra dietaria puede provocar meteorismo, clicos o gases.  Beber  abundante agua. El [place] a Economist. QU ALIMENTOS PUEDO COMER? Cereales Panes integrales. Multicereales. Avena. Arroz integral. Dwyane Luo. Trigo burgol. Mijo. Muffins de salvado. Palomitas de maz. Galletas de centeno. Verduras Batatas. [place]spinaca. Col rizada. Alcachofas. Repollo. Brcoli. Guisantes. Zanahorias. [place]. Frutas Frutos rojos. Peras. Manzanas. Naranjas Aguacates. Ciruelas y pasas. Higos secos. Carnes y otras fuentes de protenas Frijoles blancos, colorados, pintos y porotos de soja. Guisantes secos. Lentejas. Frutos secos y semillas. Lcteos Yogur fortificado con Pharmacist, hospital. Bebidas Leche de soja fortificada con Fredderick Phenix. Jugo de naranja fortificado con Fredderick Phenix. Otros Barras de [place]. Los artculos mencionados arriba pueden no ser [employer] de las bebidas o los alimentos recomendados. Comunquese con el nutricionista para conocer ms opciones. QU ALIMENTOS NO SE RECOMIENDAN? Cereales Pan blanco. Pastas hechas con Letitia Neri. Arroz blanco. Verduras Papas fritas. Verduras enlatadas. Verduras bien cocidas.  Frutas Jugo de frutas. Frutas cocidas coladas. Carnes y otras fuentes de protenas Cortes de carne con Lobbyist. Aves o pescados fritos. Lcteos Leche. Yogur. Queso crema. [employer]. Bebidas Gaseosas. Otros Tortas y pasteles. Adrian y aceites. Los artculos mencionados arriba pueden no ser [employer] de las bebidas y los alimentos que se Higher education careers adviser. Comunquese con el nutricionista para obtener ms informacin. ALGUNOS CONSEJOS PARA INCLUIR ALIMENTOS RICOS EN FIBRA EN LA DIETA  Consuma una gran variedad de alimentos ricos en fibra.  Asegrese de que la mitad de todos los cereales consumidos cada da sean cereales integrales.  Reemplace los panes y cereales hechos de harina refinada o harina blanca por panes y cereales integrales.  Reemplace el arroz blanco por arroz integral, trigo burgol o mijo.  Comience Games developer con un desayuno  rico en [place], como un cereal que contenga al menos 5gramos de fibra por porcin.  Use guisantes en lugar de carne en las sopas, ensaladas o pastas.  Coma bocadillos ricos en fibra, como frutos rojos, verduras crudas, frutos secos o palomitas de maz.   Esta informacin no tiene Marine scientist el consejo del mdico. Asegrese de hacerle al mdico cualquier pregunta que tenga.   Document Released: 05/10/2005 Document Revised: 05/31/2014 Elsevier Interactive Patient Education [employer].

## 2015-08-28 ENCOUNTER — Ambulatory Visit
Admission: RE | Admit: 2015-08-28 | Discharge: 2015-08-28 | Disposition: A | Discharge status: Home / Self Care | Payer: No Typology Code available for payment source | Source: Ambulatory Visit | Attending: Obstetrics and Gynecology | Admitting: Obstetrics and Gynecology

## 2015-08-28 DIAGNOSIS — N63 Unspecified lump in unspecified breast: Secondary | ICD-10-CM

## 2015-08-28 HISTORY — PX: BREAST BIOPSY: SHX20

## 2015-08-28 MED ORDER — GADOBENATE DIMEGLUMINE 529 MG/ML IV SOLN
10.0000 mL | Freq: Once | INTRAVENOUS | Status: AC | PRN
  Administered 2015-08-28: 10 mL via INTRAVENOUS

## 2015-09-11 ENCOUNTER — Other Ambulatory Visit: Payer: Self-pay | Admitting: General Surgery

## 2015-09-11 DIAGNOSIS — D0502 Lobular carcinoma in situ of left breast: Secondary | ICD-10-CM

## 2015-09-18 ENCOUNTER — Other Ambulatory Visit: Payer: Self-pay | Admitting: General Surgery

## 2015-09-18 DIAGNOSIS — D0502 Lobular carcinoma in situ of left breast: Secondary | ICD-10-CM

## 2015-09-24 ENCOUNTER — Encounter (HOSPITAL_BASED_OUTPATIENT_CLINIC_OR_DEPARTMENT_OTHER): Payer: Self-pay | Admitting: *Deleted

## 2015-09-29 ENCOUNTER — Encounter (HOSPITAL_BASED_OUTPATIENT_CLINIC_OR_DEPARTMENT_OTHER)
Admission: RE | Admit: 2015-09-29 | Discharge: 2015-09-29 | Disposition: A | Discharge status: Home / Self Care | Payer: No Typology Code available for payment source | Source: Ambulatory Visit | Attending: General Surgery | Admitting: General Surgery

## 2015-09-29 LAB — CBC WITH DIFFERENTIAL/PLATELET
BASOS ABS: 0 10*3/uL (ref 0.0–0.1)
BASOS PCT: 0 %
Eosinophils Absolute: 0.1 10*3/uL (ref 0.0–0.7)
Eosinophils Relative: 1 %
HEMATOCRIT: 44 % (ref 36.0–46.0)
Hemoglobin: 14.7 g/dL (ref 12.0–15.0)
LYMPHS PCT: 24 %
Lymphs Abs: 2 10*3/uL (ref 0.7–4.0)
MCH: 31.5 pg (ref 26.0–34.0)
MCHC: 33.4 g/dL (ref 30.0–36.0)
MCV: 94.4 fL (ref 78.0–100.0)
Monocytes Absolute: 0.4 10*3/uL (ref 0.1–1.0)
Monocytes Relative: 5 %
NEUTROS ABS: 5.7 10*3/uL (ref 1.7–7.7)
NEUTROS PCT: 70 %
PLATELETS: 221 10*3/uL (ref 150–400)
RBC: 4.66 MIL/uL (ref 3.87–5.11)
RDW: 12.7 % (ref 11.5–15.5)
WBC: 8.2 10*3/uL (ref 4.0–10.5)

## 2015-09-29 LAB — COMPREHENSIVE METABOLIC PANEL
ALK PHOS: 64 U/L (ref 38–126)
ALT: 21 U/L (ref 14–54)
ANION GAP: 11 (ref 5–15)
AST: 19 U/L (ref 15–41)
Albumin: 4.2 g/dL (ref 3.5–5.0)
BUN: 12 mg/dL (ref 6–20)
CALCIUM: 9.6 mg/dL (ref 8.9–10.3)
CO2: 21 mmol/L — AB (ref 22–32)
Chloride: 106 mmol/L (ref 101–111)
Creatinine, Ser: 0.69 mg/dL (ref 0.44–1.00)
GFR calc non Af Amer: >60 mL/min (ref >60)
Glucose, Bld: 102 mg/dL — ABNORMAL HIGH (ref 65–99)
Potassium: 4.2 mmol/L (ref 3.5–5.1)
SODIUM: 138 mmol/L (ref 135–145)
Total Bilirubin: 0.6 mg/dL (ref 0.3–1.2)
Total Protein: 8.3 g/dL — ABNORMAL HIGH (ref 6.5–8.1)

## 2015-09-30 NOTE — Pre-Procedure Instructions (Signed)
Pt given Boost carb drink and instructed to drink by 9am, 2 hours prior to arrival.

## 2015-10-02 ENCOUNTER — Ambulatory Visit
Admission: RE | Admit: 2015-10-02 | Discharge: 2015-10-02 | Disposition: A | Discharge status: Home / Self Care | Payer: No Typology Code available for payment source | Source: Ambulatory Visit | Attending: General Surgery | Admitting: General Surgery

## 2015-10-02 DIAGNOSIS — D0502 Lobular carcinoma in situ of left breast: Secondary | ICD-10-CM

## 2015-10-02 NOTE — H&P (Signed)
Crystal [name]  Location: [facility] Patient #: [id number] DOB: 12-Mar-1971 Widowed / Language: Spanish / Race: Undefined Female        History of Present Illness  The patient is a 45 year old female who presents with a breast mass. This is a 45 year old Hispanic female. She is here with a cousin. Neither of them speak [place]. We used a professional English to Hispanic translator named Vicente Males for the entire encounter and informed consent.  She is referred by Dr. 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  The patient has undergone recent treatment for cervical cancer. Dr. Denman George performed laparoscopic assisted TAH, BSO, and bilateral pelvic lymph node biopsy. She is node negative. A PET scan was performed which showed some enhancement in the lateral left breast. This led to mammograms and ultrasound. Category C density. No masses seen. They then sent her for an MRI which shows numerous cysts. In the right breast at the 6 o'clock position there was a 5 mm mass. In the left breast in the upper outer quadrant there was an 8 mm mass. Biopsies of the right breast shows fibroadenoma. This was complicated by hematoma that is palpable. Biopsy In the left breast shows lobular carcinoma in situ.  I have explained to her that she may or may not have breast cancer. I have explained to her that this may simply be a risk factor for cancer, but that she may have invasive cancer elsewhere in that area of the breast and that excision is advised. She is comfortable with all of this.  Past history is significant for the recent treatment for cervical cancer, kidney stones, GERD, and chronic constipation. Next line family history is negative for breast or ovarian cancer. She is a widow. Has 2 children. Denies tobacco. Drinks  alcohol occasionally. Works from time to time as a Engineer, building services.  She will be scheduled for left breast lumpectomy with radioactive seed localization. I explained how we would coordinate the seed placement and the surgery. I discussed the indications, details, techniques, and numerous risk of the surgery with her and her cousin through the use of the paid translator. They understand the risk of bleeding, infection, further surgery if this is cancer, cosmetic deformity, nerve damage with chronic pain or numbness, and other unforeseen problems. She understands all these issues. All of her questions are answered. She agrees with this plan.   Other Problems  Cervical Cancer Hemorrhoids Kidney Stone  Past Surgical History  Breast Biopsy Bilateral. Cesarean Section - Multiple  Diagnostic Studies History  Mammogram within last year Pap Smear 1-5 years ago  Allergies  No Known Drug Allergies04/20/2017  Medication History  No Current Medications Medications Reconciled  Social History  Alcohol use Occasional alcohol use. No caffeine use No  FH Kidney Disease Sister.  Pregnancy / Birth History  Age at menarche 22 years. Contraceptive History Depo-provera. Gravida 2 Maternal age 39-30 Para 2 Regular periods    Review of Systems  General Present- Weight Loss. Not Present- Appetite Loss, Chills, Fatigue, Fever, Night Sweats and Weight Gain. Skin Not Present- Change in Wart/Mole, Dryness, Hives, Jaundice, New Lesions, Non-Healing Wounds, Rash and Ulcer. HEENT Present- Visual Disturbances. Not Present- Earache, Hearing Loss, Hoarseness, Nose Bleed, Oral Ulcers, Ringing in the Ears, Seasonal Allergies, Sinus Pain, Sore Throat, Wears glasses/contact lenses and Yellow Eyes. Respiratory  Not Present- Bloody sputum, Chronic Cough, Difficulty Breathing, Snoring and Wheezing. Breast Present- Breast Mass and Breast Pain. Not Present- Nipple Discharge and Skin  Changes. Cardiovascular Not Present- Chest Pain, Difficulty Breathing Lying Down, Leg Cramps, Palpitations, Rapid Heart Rate, Shortness of Breath and Swelling of Extremities. Gastrointestinal Present- Bloating, Hemorrhoids and Indigestion. Not Present- Abdominal Pain, Bloody Stool, Change in Bowel Habits, Chronic diarrhea, Constipation, Difficulty Swallowing, Excessive gas, Gets full quickly at meals, Nausea, Rectal Pain and Vomiting. Female Genitourinary Not Present- Frequency, Nocturia, Painful Urination, Pelvic Pain and Urgency. Musculoskeletal Present- Back Pain. Not Present- Joint Pain, Joint Stiffness, Muscle Pain, Muscle Weakness and Swelling of Extremities. Neurological Not Present- Decreased Memory, Fainting, Headaches, Numbness, Seizures, Tingling, Tremor, Trouble walking and Weakness. Psychiatric Present- Bipolar and Change in Sleep Pattern. Not Present- Anxiety, Depression, Fearful and Frequent crying. Endocrine Not Present- Cold Intolerance, Excessive Hunger, Hair Changes, Heat Intolerance, Hot flashes and New Diabetes. Hematology Not Present- Easy Bruising, Excessive bleeding, Gland problems, HIV and Persistent Infections.  Vitals  Weight: 105 lb Height: 60in Body Surface Area: 1.42 m Body Mass Index: 20.51 kg/m  Temp.: 64F(Temporal)  Pulse: 75 (Regular)  BP: 120/80 (Sitting, Left Arm, Standard)   Physical Exam  General Mental Status-Alert. General Appearance-Consistent with stated age. Hydration-Well hydrated. Voice-Normal.  Head and Neck Head-normocephalic, atraumatic with no lesions or palpable masses. Trachea-midline. Thyroid Gland Characteristics - normal size and consistency.  Eye Eyeball - Bilateral-Extraocular movements intact. Sclera/Conjunctiva - Bilateral-No scleral icterus.  Chest and Lung Exam Chest and lung exam reveals -quiet, even and easy respiratory effort with no use of accessory muscles and on auscultation, normal  breath sounds, no adventitious sounds and normal vocal resonance. Inspection Chest Wall - Normal. Back - normal.  Breast Note: In the right breast at the 6:00 position there are ecchymoses and a 3 cm hematoma that is a little bit tender. No other skin changes. Nipples looked fine. No axillary adenopathy on the right. In the left breast there is a little bit of thickening in the upper outer quadrant, possibly related to the biopsy. Minimal ecchymoses. No other skin change or mass. No axillary adenopathy.   Cardiovascular Cardiovascular examination reveals -normal heart sounds, regular rate and rhythm with no murmurs and normal pedal pulses bilaterally.  Abdomen Inspection Inspection of the abdomen reveals - No Hernias. Palpation/Percussion Palpation and Percussion of the abdomen reveal - Soft, Non Tender, No Rebound tenderness, No Rigidity (guarding) and No hepatosplenomegaly. Auscultation Auscultation of the abdomen reveals - Bowel sounds normal. Note: Well-healed laparoscopic scars. 4 or 5 port sites.   Neurologic Neurologic evaluation reveals -alert and oriented x 3 with no impairment of recent or remote memory. Mental Status-Normal.  Musculoskeletal Normal Exam - Left-Upper Extremity Strength Normal and Lower Extremity Strength Normal. Normal Exam - Right-Upper Extremity Strength Normal and Lower Extremity Strength Normal.  Lymphatic Head & Neck  General Head & Neck Lymphatics: Bilateral - Description - Normal. Axillary  General Axillary Region: Bilateral - Description - Normal. Tenderness - Non Tender. Femoral & Inguinal  Generalized Femoral & Inguinal Lymphatics: Bilateral - Description - Normal. Tenderness - Non Tender.    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  Please read the printed information booklet that I have given you  CERVICAL CANCER (C53.9) Impression: Recent laparoscopic TAH and BSO, bilateral pelvic lymphadenopathy. Dr. Everitt Amber. Date of surgery July 01, 2015. CHRONIC GERD (K21.9) CHRONIC CONSTIPATION (K59.09)    Edsel Petrin. Dalbert Batman, M.D., [facility] General and Minimally invasive Surgery Breast and Colorectal Surgery Office:   [phone] Pager:   [phone]

## 2015-10-03 ENCOUNTER — Ambulatory Visit (HOSPITAL_BASED_OUTPATIENT_CLINIC_OR_DEPARTMENT_OTHER): Payer: Self-pay | Admitting: Anesthesiology

## 2015-10-03 ENCOUNTER — Ambulatory Visit (HOSPITAL_BASED_OUTPATIENT_CLINIC_OR_DEPARTMENT_OTHER)
Admission: RE | Admit: 2015-10-03 | Discharge: 2015-10-03 | Disposition: A | Discharge status: Home / Self Care | Payer: Self-pay | Source: Ambulatory Visit | Attending: General Surgery | Admitting: General Surgery

## 2015-10-03 ENCOUNTER — Encounter (HOSPITAL_BASED_OUTPATIENT_CLINIC_OR_DEPARTMENT_OTHER)
Admission: RE | Disposition: A | Discharge status: Home / Self Care | Payer: Self-pay | Source: Ambulatory Visit | Attending: General Surgery

## 2015-10-03 ENCOUNTER — Ambulatory Visit
Admission: RE | Admit: 2015-10-03 | Discharge: 2015-10-03 | Disposition: A | Discharge status: Home / Self Care | Payer: No Typology Code available for payment source | Source: Ambulatory Visit | Attending: General Surgery | Admitting: General Surgery

## 2015-10-03 ENCOUNTER — Encounter (HOSPITAL_BASED_OUTPATIENT_CLINIC_OR_DEPARTMENT_OTHER): Payer: Self-pay | Admitting: *Deleted

## 2015-10-03 DIAGNOSIS — Z8541 Personal history of malignant neoplasm of cervix uteri: Secondary | ICD-10-CM | POA: Insufficient documentation

## 2015-10-03 DIAGNOSIS — D0502 Lobular carcinoma in situ of left breast: Secondary | ICD-10-CM

## 2015-10-03 DIAGNOSIS — K5909 Other constipation: Secondary | ICD-10-CM | POA: Insufficient documentation

## 2015-10-03 HISTORY — PX: BREAST LUMPECTOMY WITH RADIOACTIVE SEED LOCALIZATION: SHX6424

## 2015-10-03 HISTORY — DX: Lobular carcinoma in situ of left breast: D05.02

## 2015-10-03 HISTORY — PX: BREAST EXCISIONAL BIOPSY: SUR124

## 2015-10-03 HISTORY — DX: Other specified postprocedural states: Z98.890

## 2015-10-03 HISTORY — DX: Nausea with vomiting, unspecified: R11.2

## 2015-10-03 SURGERY — BREAST LUMPECTOMY WITH RADIOACTIVE SEED LOCALIZATION
Anesthesia: General | Site: Breast | Laterality: Left

## 2015-10-03 MED ORDER — FENTANYL CITRATE (PF) 100 MCG/2ML IJ SOLN
INTRAMUSCULAR | Status: DC | PRN
  Administered 2015-10-03: 100 ug via INTRAVENOUS

## 2015-10-03 MED ORDER — HYDROMORPHONE HCL 1 MG/ML IJ SOLN
0.2500 mg | INTRAMUSCULAR | Status: DC | PRN
  Administered 2015-10-03: 0.5 mg via INTRAVENOUS

## 2015-10-03 MED ORDER — CEFAZOLIN SODIUM-DEXTROSE 2-4 GM/100ML-% IV SOLN: 2.0000 g | INTRAVENOUS | Status: DC

## 2015-10-03 MED ORDER — ONDANSETRON HCL 4 MG/2ML IJ SOLN
INTRAMUSCULAR | Status: DC | PRN
  Administered 2015-10-03 (×2): 4 mg via INTRAVENOUS

## 2015-10-03 MED ORDER — HYDROCODONE-ACETAMINOPHEN 5-325 MG PO TABS: 1.0000 | ORAL_TABLET | Freq: Four times a day (QID) | ORAL | Status: DC | PRN

## 2015-10-03 MED ORDER — CHLORHEXIDINE GLUCONATE 4 % EX LIQD: 1.0000 "application " | Freq: Once | CUTANEOUS | Status: DC

## 2015-10-03 MED ORDER — GLYCOPYRROLATE 0.2 MG/ML IJ SOLN: 0.2000 mg | Freq: Once | INTRAMUSCULAR | Status: DC | PRN

## 2015-10-03 MED ORDER — LIDOCAINE 2% (20 MG/ML) 5 ML SYRINGE
INTRAMUSCULAR | Status: AC
  Filled 2015-10-03: qty 5

## 2015-10-03 MED ORDER — PROPOFOL 10 MG/ML IV BOLUS
INTRAVENOUS | Status: DC | PRN
  Administered 2015-10-03: 120 mg via INTRAVENOUS

## 2015-10-03 MED ORDER — SCOPOLAMINE 1 MG/3DAYS TD PT72
MEDICATED_PATCH | TRANSDERMAL | Status: AC
  Filled 2015-10-03: qty 1

## 2015-10-03 MED ORDER — ONDANSETRON HCL 4 MG/2ML IJ SOLN
INTRAMUSCULAR | Status: AC
  Filled 2015-10-03: qty 2

## 2015-10-03 MED ORDER — ONDANSETRON HCL 4 MG/2ML IJ SOLN: 4.0000 mg | Freq: Four times a day (QID) | INTRAMUSCULAR | Status: DC | PRN

## 2015-10-03 MED ORDER — SCOPOLAMINE 1 MG/3DAYS TD PT72
1.0000 | MEDICATED_PATCH | Freq: Once | TRANSDERMAL | Status: AC | PRN
  Administered 2015-10-03: 1 via TRANSDERMAL

## 2015-10-03 MED ORDER — HYDROMORPHONE HCL 1 MG/ML IJ SOLN
INTRAMUSCULAR | Status: AC
  Filled 2015-10-03: qty 1

## 2015-10-03 MED ORDER — LACTATED RINGERS IV SOLN
INTRAVENOUS | Status: DC
  Administered 2015-10-03 (×3): via INTRAVENOUS

## 2015-10-03 MED ORDER — DEXAMETHASONE SODIUM PHOSPHATE 10 MG/ML IJ SOLN
INTRAMUSCULAR | Status: AC
  Filled 2015-10-03: qty 1

## 2015-10-03 MED ORDER — DEXAMETHASONE SODIUM PHOSPHATE 4 MG/ML IJ SOLN
INTRAMUSCULAR | Status: DC | PRN
  Administered 2015-10-03: 10 mg via INTRAVENOUS

## 2015-10-03 MED ORDER — MIDAZOLAM HCL 5 MG/5ML IJ SOLN
INTRAMUSCULAR | Status: DC | PRN
  Administered 2015-10-03: 2 mg via INTRAVENOUS

## 2015-10-03 MED ORDER — BUPIVACAINE-EPINEPHRINE (PF) 0.5% -1:200000 IJ SOLN
INTRAMUSCULAR | Status: DC | PRN
  Administered 2015-10-03: 6 mL

## 2015-10-03 MED ORDER — FENTANYL CITRATE (PF) 100 MCG/2ML IJ SOLN
INTRAMUSCULAR | Status: AC
  Filled 2015-10-03: qty 2

## 2015-10-03 MED ORDER — LIDOCAINE 2% (20 MG/ML) 5 ML SYRINGE
INTRAMUSCULAR | Status: DC | PRN
  Administered 2015-10-03: 50 mg via INTRAVENOUS

## 2015-10-03 MED ORDER — PROPOFOL 10 MG/ML IV BOLUS
INTRAVENOUS | Status: AC
  Filled 2015-10-03: qty 20

## 2015-10-03 MED ORDER — OXYCODONE HCL 5 MG/5ML PO SOLN: 5.0000 mg | Freq: Once | ORAL | Status: DC | PRN

## 2015-10-03 MED ORDER — CEFAZOLIN SODIUM-DEXTROSE 2-4 GM/100ML-% IV SOLN
INTRAVENOUS | Status: AC
  Filled 2015-10-03: qty 100

## 2015-10-03 MED ORDER — OXYCODONE HCL 5 MG PO TABS: 5.0000 mg | ORAL_TABLET | Freq: Once | ORAL | Status: DC | PRN

## 2015-10-03 MED ORDER — MIDAZOLAM HCL 2 MG/2ML IJ SOLN
INTRAMUSCULAR | Status: AC
  Filled 2015-10-03: qty 2

## 2015-10-03 SURGICAL SUPPLY — 58 items
APPLIER CLIP 9.375 MED OPEN (MISCELLANEOUS)
BENZOIN TINCTURE PRP APPL 2/3 (GAUZE/BANDAGES/DRESSINGS) IMPLANT
BINDER BREAST LRG (GAUZE/BANDAGES/DRESSINGS) IMPLANT
BINDER BREAST MEDIUM (GAUZE/BANDAGES/DRESSINGS) ×2 IMPLANT
BINDER BREAST XLRG (GAUZE/BANDAGES/DRESSINGS) IMPLANT
BINDER BREAST XXLRG (GAUZE/BANDAGES/DRESSINGS) IMPLANT
BLADE HEX COATED 2.75 (ELECTRODE) ×2 IMPLANT
BLADE SURG 10 STRL SS (BLADE) IMPLANT
BLADE SURG 15 STRL LF DISP TIS (BLADE) ×1 IMPLANT
BLADE SURG 15 STRL SS (BLADE) ×1
CANISTER SUC SOCK COL 7IN (MISCELLANEOUS) ×2 IMPLANT
CANISTER SUCT 1200ML W/VALVE (MISCELLANEOUS) ×2 IMPLANT
CHLORAPREP W/TINT 26ML (MISCELLANEOUS) ×2 IMPLANT
CLIP APPLIE 9.375 MED OPEN (MISCELLANEOUS) IMPLANT
COVER BACK TABLE 60X90IN (DRAPES) ×2 IMPLANT
COVER MAYO STAND STRL (DRAPES) ×2 IMPLANT
COVER PROBE W GEL 5X96 (DRAPES) ×2 IMPLANT
DECANTER SPIKE VIAL GLASS SM (MISCELLANEOUS) IMPLANT
DERMABOND ADVANCED (GAUZE/BANDAGES/DRESSINGS) ×1
DERMABOND ADVANCED .7 DNX12 (GAUZE/BANDAGES/DRESSINGS) ×1 IMPLANT
DEVICE DUBIN W/COMP PLATE 8390 (MISCELLANEOUS) ×2 IMPLANT
DRAPE LAPAROSCOPIC ABDOMINAL (DRAPES) ×2 IMPLANT
DRAPE UTILITY XL STRL (DRAPES) ×2 IMPLANT
DRSG PAD ABDOMINAL 8X10 ST (GAUZE/BANDAGES/DRESSINGS) IMPLANT
ELECT REM PT RETURN 9FT ADLT (ELECTROSURGICAL) ×2
ELECTRODE REM PT RTRN 9FT ADLT (ELECTROSURGICAL) ×1 IMPLANT
GLOVE BIO SURGEON STRL SZ7 (GLOVE) ×4 IMPLANT
GLOVE EUDERMIC 7 POWDERFREE (GLOVE) ×2 IMPLANT
GOWN STRL REUS W/ TWL LRG LVL3 (GOWN DISPOSABLE) ×1 IMPLANT
GOWN STRL REUS W/ TWL XL LVL3 (GOWN DISPOSABLE) ×1 IMPLANT
GOWN STRL REUS W/TWL LRG LVL3 (GOWN DISPOSABLE) ×1
GOWN STRL REUS W/TWL XL LVL3 (GOWN DISPOSABLE) ×1
ILLUMINATOR WAVEGUIDE N/F (MISCELLANEOUS) IMPLANT
KIT MARKER MARGIN INK (KITS) ×2 IMPLANT
LIGHT WAVEGUIDE WIDE FLAT (MISCELLANEOUS) IMPLANT
NEEDLE HYPO 25X1 1.5 SAFETY (NEEDLE) ×2 IMPLANT
NS IRRIG 1000ML POUR BTL (IV SOLUTION) ×2 IMPLANT
PACK BASIN DAY SURGERY FS (CUSTOM PROCEDURE TRAY) ×2 IMPLANT
PENCIL BUTTON HOLSTER BLD 10FT (ELECTRODE) ×2 IMPLANT
SHEET MEDIUM DRAPE 40X70 STRL (DRAPES) IMPLANT
SLEEVE SCD COMPRESS KNEE MED (MISCELLANEOUS) ×2 IMPLANT
SPONGE GAUZE 4X4 12PLY STER LF (GAUZE/BANDAGES/DRESSINGS) IMPLANT
SPONGE LAP 18X18 X RAY DECT (DISPOSABLE) IMPLANT
SPONGE LAP 4X18 X RAY DECT (DISPOSABLE) ×4 IMPLANT
STRIP CLOSURE SKIN 1/2X4 (GAUZE/BANDAGES/DRESSINGS) IMPLANT
SUT ETHILON 3 0 FSL (SUTURE) IMPLANT
SUT MNCRL AB 4-0 PS2 18 (SUTURE) ×2 IMPLANT
SUT SILK 2 0 SH (SUTURE) ×2 IMPLANT
SUT VIC AB 2-0 CT1 27 (SUTURE)
SUT VIC AB 2-0 CT1 TAPERPNT 27 (SUTURE) IMPLANT
SUT VIC AB 3-0 SH 27 (SUTURE)
SUT VIC AB 3-0 SH 27X BRD (SUTURE) IMPLANT
SUT VICRYL 3-0 CR8 SH (SUTURE) ×2 IMPLANT
SYRINGE 10CC LL (SYRINGE) ×2 IMPLANT
TOWEL OR 17X24 6PK STRL BLUE (TOWEL DISPOSABLE) ×2 IMPLANT
TOWEL OR NON WOVEN STRL DISP B (DISPOSABLE) IMPLANT
TUBE CONNECTING 20X1/4 (TUBING) ×2 IMPLANT
YANKAUER SUCT BULB TIP NO VENT (SUCTIONS) ×2 IMPLANT

## 2015-10-03 NOTE — Op Note (Signed)
Patient Name:           Crystal [name]   Date of Surgery:        10/03/2015  Pre op Diagnosis:      Lobular carcinoma in situ left breast   Post op Diagnosis:    Same  Procedure:                 Left breast lumpectomy with radioactive seed localization  Surgeon:                     Edsel Petrin. Dalbert Batman, M.D., FACS  Assistant:                      OR staff  Operative Indications:   . This is a 45 year old Hispanic female.   She is referred by Dr. 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  The patient has undergone recent treatment for cervical cancer. Dr. Denman George performed laparoscopic assisted TAH, BSO, and bilateral pelvic lymph node biopsy. She is node negative. A PET scan was performed which showed some enhancement in the lateral left breast. This led to mammograms and ultrasound. Category C density. No masses seen. They then sent her for an MRI which shows numerous cysts. In the right breast at the 6 o'clock position there was a 5 mm mass. In the left breast in the upper outer quadrant there was an 8 mm mass. Biopsies of the right breast shows fibroadenoma. This was complicated by hematoma that is palpable. Biopsy In the left breast shows lobular carcinoma in situ.  I have explained to her that she may or may not have breast cancer. I have explained to her that this may simply be a risk factor for cancer, but that she may have invasive cancer elsewhere in that area of the breast and that excision is advised. She is comfortable with all of this.  Past history is significant for the recent treatment for cervical cancer, kidney stones, GERD, and chronic constipation. Next line family history is negative for breast or ovarian cancer. She is a widow. Has 2 children. Denies tobacco. Drinks alcohol occasionally. Works  from time to time as a Engineer, building services.  She will be scheduled for left breast lumpectomy with radioactive seed localization. I explained how we would coordinate the seed placement and the surgery. I discussed the indications, details, techniques, and numerous risk of the surgery with her and her cousin through the use of the paid translator. They understand the risk of bleeding, infection, further surgery if this is cancer, cosmetic deformity, nerve damage with chronic pain or numbness, and other unforeseen problems. She understands all these issues. All of her questions are answered. She agrees with this plan.  Operative Findings:       She is premenopausal and her breast tissues were gets and very vascular.  The specimen mammogram looked good, showing the marker clip and the radioactive seed nearly in the center of the specimen.  Procedure in Detail:          Following the induction of general LMA anesthesia the patient's left breast was prepped and draped in a sterile fashion.  Surgical timeout was performed, intravenous antibiotics were given, and 0.5% Marcaine with epinephrine was used as a local infiltration anesthetic.    Using the neoprobe identified the  area of maximum radioactivity in the upper outer quadrant.  This was for 5 cm outside the areolar margin.  I fashioned a curvilinear circumareolar incision overlying this point.  Dissection was carried down into the breast tissue and using the neoprobe frequently around the radioactive signal.  The specimen was removed and marked with silk sutures and a 6 color ink  kit to orient the pathologist.  The specimen mammogram looked good, was marked and sent to the lab.  Hemostasis was excellent and achieved  with electrocautery.  It was more blood loss than usual and probably lost almost 100 mL of blood due to the hypervascularity of the tissues.  At the end of the case the wound was irrigated and it was completely hemostatic.  The breast tissues were  closed in 2 layers with interrupted 3-0 Vicryl sutures and the skin closed with a running subcuticular 4-0 Monocryl and Dermabond.  Breast binder and ice pack were placed.  The patient tolerated the procedure well was taken to PACU in stable condition.  EBL 100 mL.  Counts correct.  Complications none.     Edsel Petrin. Dalbert Batman, M.D., FACS General and Minimally Invasive Surgery Breast and Colorectal Surgery  10/03/2015 1:30 PM

## 2015-10-03 NOTE — Anesthesia Preprocedure Evaluation (Signed)
Anesthesia Evaluation  Patient identified by MRN, date of birth, ID band Patient awake    Reviewed: Allergy & Precautions, NPO status , Patient's Chart, lab work & pertinent test results  History of Anesthesia Complications (+) PONV  Airway Mallampati: II   Neck ROM: full    Dental   Pulmonary neg pulmonary ROS,    breath sounds clear to auscultation       Cardiovascular negative cardio ROS   Rhythm:regular Rate:Normal     Neuro/Psych    GI/Hepatic   Endo/Other    Renal/GU stones     Musculoskeletal   Abdominal   Peds  Hematology   Anesthesia Other Findings   Reproductive/Obstetrics                             Anesthesia Physical Anesthesia Plan  ASA: II  Anesthesia Plan: General   Post-op Pain Management:    Induction: Intravenous  Airway Management Planned: LMA  Additional Equipment:   Intra-op Plan:   Post-operative Plan:   Informed Consent: I have reviewed the patients History and Physical, chart, labs and discussed the procedure including the risks, benefits and alternatives for the proposed anesthesia with the patient or authorized representative who has indicated his/her understanding and acceptance.     Plan Discussed with: CRNA, Anesthesiologist and Surgeon  Anesthesia Plan Comments:         Anesthesia Quick Evaluation

## 2015-10-03 NOTE — Anesthesia Postprocedure Evaluation (Signed)
Anesthesia Post Note  Patient: Crystal [name]  Procedure(s) Performed: Procedure(s) (LRB): LEFT BREAST LUMPECTOMY WITH RADIOACTIVE SEED LOCALIZATION (Left)  Patient location during evaluation: PACU Anesthesia Type: General Level of consciousness: awake, oriented, sedated and patient cooperative Pain management: pain level controlled Vital Signs Assessment: post-procedure vital signs reviewed and stable Respiratory status: spontaneous breathing and respiratory function stable Cardiovascular status: stable Anesthetic complications: no    Last Vitals:  Filed Vitals:   10/03/15 1400 10/03/15 1415  BP: 117/66 117/69  Pulse: 83 77  Temp:    Resp: 18 12    Last Pain:  Filed Vitals:   10/03/15 1418  PainSc: 1                  Lavina Resor EDWARD

## 2015-10-03 NOTE — Transfer of Care (Signed)
Immediate Anesthesia Transfer of Care Note  Patient: Crystal [name]  Procedure(s) Performed: Procedure(s): LEFT BREAST LUMPECTOMY WITH RADIOACTIVE SEED LOCALIZATION (Left)  Patient Location: PACU  Anesthesia Type:General  Level of Consciousness: awake, sedated and patient cooperative  Airway & Oxygen Therapy: Patient Spontanous Breathing and Patient connected to face mask oxygen  Post-op Assessment: Report given to RN and Post -op Vital signs reviewed and stable  Post vital signs: Reviewed and stable  Last Vitals:  Filed Vitals:   10/03/15 1125  BP: 119/60  Pulse: 82  Temp: 36.8 C  Resp: 16    Last Pain: There were no vitals filed for this visit.    Patients Stated Pain Goal: 0 ([phone] [phone])  Complications: No apparent anesthesia complications

## 2015-10-03 NOTE — Anesthesia Procedure Notes (Signed)
Procedure Name: LMA Insertion Date/Time: 10/03/2015 12:44 PM Performed by: Lyndee Leo Pre-anesthesia Checklist: Patient identified, Emergency Drugs available, Suction available and Patient being monitored Patient Re-evaluated:Patient Re-evaluated prior to inductionOxygen Delivery Method: Circle System Utilized Preoxygenation: Pre-oxygenation with 100% oxygen Intubation Type: IV induction Ventilation: Mask ventilation without difficulty LMA: LMA inserted LMA Size: 3.0 Number of attempts: 1 Airway Equipment and Method: Bite block Placement Confirmation: positive ETCO2 Tube secured with: Tape Dental Injury: Teeth and Oropharynx as per pre-operative assessment

## 2015-10-03 NOTE — Interval H&P Note (Signed)
History and Physical Interval Note:  10/03/2015 12:25 PM  Crystal [name]  has presented today for surgery, with the diagnosis of lobular carcinoma in situ left breast  The various methods of treatment have been discussed with the patient and family. After consideration of risks, benefits and other options for treatment, the patient has consented to  Procedure(s): LEFT BREAST LUMPECTOMY WITH RADIOACTIVE SEED LOCALIZATION (Left) as a surgical intervention .  The patient's history has been reviewed, patient examined, no change in status, stable for surgery.  I have reviewed the patient's chart and labs.  Questions were answered to the patient's satisfaction.     Adin Hector

## 2015-10-03 NOTE — Discharge Instructions (Signed)
[facility] °Office Phone Number [phone] ° °BREAST BIOPSY/ PARTIAL MASTECTOMY: POST OP INSTRUCTIONS ° °Always review your discharge instruction sheet given to you by the facility where your surgery was performed. ° °IF YOU HAVE DISABILITY OR FAMILY LEAVE FORMS, YOU MUST BRING THEM TO THE OFFICE FOR PROCESSING.  DO NOT GIVE THEM TO YOUR DOCTOR. ° °1. A prescription for pain medication may be given to you upon discharge.  Take your pain medication as prescribed, if needed.  If narcotic pain medicine is not needed, then you may take acetaminophen (Tylenol) or ibuprofen (Advil) as needed. °2. Take your usually prescribed medications unless otherwise directed °3. If you need a refill on your pain medication, please contact your pharmacy.  They will contact our office to request authorization.  Prescriptions will not be filled after 5pm or on week-ends. °4. You should eat very light the first 24 hours after surgery, such as soup, crackers, pudding, etc.  Resume your normal diet the day after surgery. °5. Most patients will experience some swelling and bruising in the breast.  Ice packs and a good support bra will help.  Swelling and bruising can take several days to resolve.  °6. It is common to experience some constipation if taking pain medication after surgery.  Increasing fluid intake and taking a stool softener will usually help or prevent this problem from occurring.  A mild laxative (Milk of Magnesia or Miralax) should be taken according to package directions if there are no bowel movements after 48 hours. °7. Unless discharge instructions indicate otherwise, you may remove your bandages 24-48 hours after surgery, and you may shower at that time.  You may have steri-strips (small skin tapes) in place directly over the incision.  These strips should be left on the skin for 7-10 days.  If your surgeon used skin glue on the incision, you may shower in 24 hours.  The glue will flake off over the  next 2-3 weeks.  Any sutures or staples will be removed at the office during your follow-up visit. °8. ACTIVITIES:  You may resume regular daily activities (gradually increasing) beginning the next day.  Wearing a good support bra or sports bra minimizes pain and swelling.  You may have sexual intercourse when it is comfortable. °a. You may drive when you no longer are taking prescription pain medication, you can comfortably wear a seatbelt, and you can safely maneuver your car and apply brakes. °b. RETURN TO WORK:  ______________________________________________________________________________________ °9. You should see your doctor in the office for a follow-up appointment approximately two weeks after your surgery.  Your doctor’s nurse will typically make your follow-up appointment when she calls you with your pathology report.  Expect your pathology report 2-3 business days after your surgery.  You may call to check if you do not hear from us after three days. °10. OTHER INSTRUCTIONS: _______________________________________________________________________________________________ _____________________________________________________________________________________________________________________________________ °_____________________________________________________________________________________________________________________________________ °_____________________________________________________________________________________________________________________________________ ° °WHEN TO CALL YOUR DOCTOR: °1. Fever over 101.0 °2. Nausea and/or vomiting. °3. Extreme swelling or bruising. °4. Continued bleeding from incision. °5. Increased pain, redness, or drainage from the incision. ° °The clinic staff is available to answer your questions during regular business hours.  Please don’t hesitate to call and ask to speak to one of the nurses for clinical concerns.  If you have a medical emergency, go to the nearest  emergency room or call 911.  A surgeon from [facility] is always on call at the hospital. ° °For further questions, please visit [email or web address]  ° ° ° °  Post Anesthesia Home Care Instructions ° °Activity: °Get plenty of rest for the remainder of the day. A responsible adult should stay with you for 24 hours following the procedure.  °For the next 24 hours, DO NOT: °-Drive a car °-Operate machinery °-Drink alcoholic beverages °-Take any medication unless instructed by your physician °-Make any legal decisions or sign important papers. ° °Meals: °Start with liquid foods such as gelatin or soup. Progress to regular foods as tolerated. Avoid greasy, spicy, heavy foods. If nausea and/or vomiting occur, drink only clear liquids until the nausea and/or vomiting subsides. Call your physician if vomiting continues. ° °Special Instructions/Symptoms: °Your throat may feel dry or sore from the anesthesia or the breathing tube placed in your throat during surgery. If this causes discomfort, gargle with warm salt water. The discomfort should disappear within 24 hours. ° °If you had a scopolamine patch placed behind your ear for the management of post- operative nausea and/or vomiting: ° °1. The medication in the patch is effective for 72 hours, after which it should be removed.  Wrap patch in a tissue and discard in the trash. Wash hands thoroughly with soap and water. °2. You may remove the patch earlier than 72 hours if you experience unpleasant side effects which may include dry mouth, dizziness or visual disturbances. °3. Avoid touching the patch. Wash your hands with soap and water after contact with the patch. °  ° °

## 2015-10-06 ENCOUNTER — Encounter (HOSPITAL_BASED_OUTPATIENT_CLINIC_OR_DEPARTMENT_OTHER): Payer: Self-pay | Admitting: General Surgery

## 2015-10-08 NOTE — Progress Notes (Signed)
Quick Note:  Inform patient of Pathology report,.Tell her no evidence of cancer. Will discuss in detail in office.  hmi ______

## 2015-10-27 ENCOUNTER — Ambulatory Visit: Payer: No Typology Code available for payment source | Attending: Internal Medicine

## 2015-11-03 ENCOUNTER — Ambulatory Visit (INDEPENDENT_AMBULATORY_CARE_PROVIDER_SITE_OTHER): Payer: No Typology Code available for payment source | Admitting: Family Medicine

## 2015-11-03 ENCOUNTER — Encounter: Payer: Self-pay | Admitting: Family Medicine

## 2015-11-03 VITALS — HR 79 | Temp 97.7°F | Ht 59.0 in | Wt 106.0 lb

## 2015-11-03 DIAGNOSIS — K055 Other periodontal diseases: Secondary | ICD-10-CM

## 2015-11-03 DIAGNOSIS — Z0000 Encounter for general adult medical examination without abnormal findings: Secondary | ICD-10-CM

## 2015-11-03 DIAGNOSIS — K068 Other specified disorders of gingiva and edentulous alveolar ridge: Secondary | ICD-10-CM

## 2015-11-03 LAB — CBC WITH DIFFERENTIAL/PLATELET
BASOS PCT: 0 %
Basophils Absolute: 0 cells/uL (ref 0–200)
EOS PCT: 3 %
Eosinophils Absolute: 252 cells/uL (ref 15–500)
HCT: 44.3 % (ref 35.0–45.0)
HEMOGLOBIN: 15.2 g/dL (ref 11.7–15.5)
LYMPHS ABS: 2016 {cells}/uL (ref 850–3900)
Lymphocytes Relative: 24 %
MCH: 33 pg (ref 27.0–33.0)
MCHC: 34.3 g/dL (ref 32.0–36.0)
MCV: 96.3 fL (ref 80.0–100.0)
MPV: 10.7 fL (ref 7.5–12.5)
Monocytes Absolute: 336 cells/uL (ref 200–950)
Monocytes Relative: 4 %
NEUTROS ABS: 5796 {cells}/uL (ref 1500–7800)
Neutrophils Relative %: 69 %
Platelets: 249 10*3/uL (ref 140–400)
RBC: 4.6 MIL/uL (ref 3.80–5.10)
RDW: 13.2 % (ref 11.0–15.0)
WBC: 8.4 10*3/uL (ref 3.8–10.8)

## 2015-11-03 LAB — LIPID PANEL
CHOLESTEROL: 181 mg/dL (ref 125–200)
HDL: 57 mg/dL (ref >=46)
LDL Cholesterol: 105 mg/dL (ref <130)
TRIGLYCERIDES: 97 mg/dL (ref <150)
Total CHOL/HDL Ratio: 3.2 Ratio (ref <=5.0)
VLDL: 19 mg/dL (ref <30)

## 2015-11-03 LAB — COMPLETE METABOLIC PANEL WITH GFR
ALBUMIN: 4.3 g/dL (ref 3.6–5.1)
ALK PHOS: 71 U/L (ref 33–115)
ALT: 16 U/L (ref 6–29)
AST: 14 U/L (ref 10–35)
BUN: 9 mg/dL (ref 7–25)
CALCIUM: 9.5 mg/dL (ref 8.6–10.2)
CO2: 22 mmol/L (ref 20–31)
Chloride: 104 mmol/L (ref 98–110)
Creat: 0.71 mg/dL (ref 0.50–1.10)
Glucose, Bld: 74 mg/dL (ref 65–99)
POTASSIUM: 4.5 mmol/L (ref 3.5–5.3)
Sodium: 139 mmol/L (ref 135–146)
TOTAL PROTEIN: 7.8 g/dL (ref 6.1–8.1)
Total Bilirubin: 0.5 mg/dL (ref 0.2–1.2)

## 2015-11-03 NOTE — Patient Instructions (Signed)
We will let you know if we find any abnormalities in blood work. Are sending in referral to dentist to check gums.

## 2015-11-03 NOTE — Progress Notes (Signed)
Patient ID: Crystal [name], female   DOB: [birth year], 45 y.o.   MRN: [id number]   Crystal [name], is a 45 y.o. female  [id number]  [id number]  DOB - Sep 26, 1970  CC:  Chief Complaint  Patient presents with  . new patient/get established    also has a problem, she has bright red bleeding from her mouth every morning for months, she states she has seen the dentsist within the year, she does not know if it is coming from the inside of her mouth or from her essophagus, also has some moles on her arms she is concerned about. She states she has not had breast cancer, but has had cervical cancer       HPI: Crystal [name] is a 45 y.o. female here to establish care. She has a history of cervical cancer and a hysterectomy. She also had a breast lump that was found to be non-cancerous. She has a history of kidney stones. She is on no medications for chronic conditions at this time. Her only complaint is of some blood in her mouth in the morning. She does report some bleeding when she brushes her teeth. She also points out several moles.  No Known Allergies Past Medical History  Diagnosis Date  . Cancer ([place])     cervical cancer dx.   . Kidney stones     cyst on kidney -no problems  . History of kidney stones     x1   . PONV (postoperative nausea and vomiting)   . 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 b complex vitamins capsule Take 1 capsule by mouth daily.    . Methylcellulose, Laxative, (CITRUCEL) 500 MG TABS Take by mouth.    . NON FORMULARY Zeal for life focus energy health drink    . acetaminophen (TYLENOL) 325 MG tablet Take 650 mg by mouth every 6 (six) hours as needed. Reported on 11/03/2015    . HYDROcodone-acetaminophen (NORCO) 5-325 MG tablet Take 1-2 tablets by mouth every 6 (six) hours as needed for moderate pain or severe pain. (Patient not  taking: Reported on 11/03/2015) 30 tablet 0   No current facility-administered medications on file prior to visit.   Family History  Problem Relation Age of Onset  . Hypertension Sister    Social History   Social History  . Marital Status: Widowed    Spouse Name: N/A  . Number of Children: N/A  . Years of Education: N/A   Occupational History  . Not on file.   Social History Main Topics  . Smoking status: Never Smoker   . Smokeless tobacco: Never Used  . Alcohol Use: No     Comment: occasionally  . Drug Use: No  . Sexual Activity: Yes    Birth Control/ Protection: Surgical   Other Topics Concern  . Not on file   Social History Narrative    Review of Systems: Constitutional: Negative for fever, chills, appetite change, weight loss,  Fatigue. Skin: Positive for several moles. HENT: Negative for ear pain, ear discharge.nose bleeds Eyes: Negative for pain, discharge, redness, itching and visual disturbance. Positive for dryness Neck: Negative for pain, stiffness Respiratory: Negative for cough, shortness of breath,   Cardiovascular: Negative for chest pain, palpitations. Some swelling of feet and legs with prolonged standing Gastrointestinal: Negative for abdominal pain, nausea, vomiting, diarrhea, constipations Genitourinary: Negative for dysuria, urgency, frequency, hematuria,  Musculoskeletal:  Positive for back pain, joint pain, joint  swelling, and gait problem.Negative for weakness. Neurological: Negative for dizziness, tremors, seizures, syncope,   light-headedness, numbness and headaches.  Hematological: Negative for easy bruising or bleeding Psychiatric/Behavioral: Negative for depression, anxiety, decreased concentration, confusion   Objective:   Filed Vitals:   11/03/15 0945  Pulse: 79  Temp: 97.7 F (36.5 C)    Physical Exam: Constitutional: Patient appears well-developed and well-nourished. No distress. HENT: Normocephalic, atraumatic, External  right and left ear normal. Oropharynx is clear and moist. No obvious source of bleeding Eyes: Conjunctivae and EOM are normal. PERRLA, no scleral icterus. Neck: Normal ROM. Neck supple. No lymphadenopathy, No thyromegaly. CVS: RRR, S1/S2 +, no murmurs, no gallops, no rubs Pulmonary: Effort and breath sounds normal, no stridor, rhonchi, wheezes, rales.  Abdominal: Soft. Normoactive BS,, no distension, tenderness, rebound or guarding.  Musculoskeletal: Normal range of motion. No edema and no tenderness.  Neuro: Alert.Normal muscle tone coordination. Non-focal Skin: Skin is warm and dry. No rash noted. Not diaphoretic. No erythema. No pallor. Psychiatric: Normal mood and affect. Behavior, judgment, thought content normal.  Lab Results  Component Value Date   WBC 8.2 09/29/2015   HGB 14.7 09/29/2015   HCT 44.0 09/29/2015   MCV 94.4 09/29/2015   PLT 221 09/29/2015   Lab Results  Component Value Date   CREATININE 0.69 09/29/2015   BUN 12 09/29/2015   NA 138 09/29/2015   K 4.2 09/29/2015   CL 106 09/29/2015   CO2 21* 09/29/2015    Lab Results  Component Value Date   HGBA1C 5.5 04/04/2015   Lipid Panel     Component Value Date/Time   CHOL 193 04/04/2015 1110   TRIG 78 04/04/2015 1110   HDL 52 04/04/2015 1110   CHOLHDL 3.7 04/04/2015 1110   VLDL 16 04/04/2015 1110   LDLCALC 125 04/04/2015 1110       Assessment and plan:   1. Health care maintenance  - COMPLETE METABOLIC PANEL WITH GFR - CBC with Differential - Lipid panel - HIV antibody (with reflex)  2. Bleeding gums  - Ambulatory referral to Dentistry   No Follow-up on file.  The patient was given clear instructions to go to ER or return to medical center if symptoms don't improve, worsen or new problems develop. The patient verbalized understanding.    Micheline Chapman FNP  11/03/2015, 10:15 AM

## 2015-11-04 LAB — HIV ANTIBODY (ROUTINE TESTING W REFLEX): HIV 1&2 Ab, 4th Generation: NONREACTIVE

## 2015-11-05 ENCOUNTER — Encounter: Payer: Self-pay | Admitting: Gynecologic Oncology

## 2015-11-05 ENCOUNTER — Ambulatory Visit: Payer: Self-pay | Attending: Gynecologic Oncology | Admitting: Gynecologic Oncology

## 2015-11-05 VITALS — BP 95/61 | HR 89 | Temp 98.8°F | Resp 18 | Ht 59.0 in | Wt 106.8 lb

## 2015-11-05 DIAGNOSIS — K5900 Constipation, unspecified: Secondary | ICD-10-CM

## 2015-11-05 DIAGNOSIS — R87612 Low grade squamous intraepithelial lesion on cytologic smear of cervix (LGSIL): Secondary | ICD-10-CM | POA: Insufficient documentation

## 2015-11-05 DIAGNOSIS — K5909 Other constipation: Secondary | ICD-10-CM | POA: Insufficient documentation

## 2015-11-05 DIAGNOSIS — Z87442 Personal history of urinary calculi: Secondary | ICD-10-CM | POA: Insufficient documentation

## 2015-11-05 DIAGNOSIS — C539 Malignant neoplasm of cervix uteri, unspecified: Secondary | ICD-10-CM | POA: Insufficient documentation

## 2015-11-05 DIAGNOSIS — Z9079 Acquired absence of other genital organ(s): Secondary | ICD-10-CM | POA: Insufficient documentation

## 2015-11-05 DIAGNOSIS — N761 Subacute and chronic vaginitis: Secondary | ICD-10-CM | POA: Insufficient documentation

## 2015-11-05 DIAGNOSIS — Z90710 Acquired absence of both cervix and uterus: Secondary | ICD-10-CM | POA: Insufficient documentation

## 2015-11-05 DIAGNOSIS — N319 Neuromuscular dysfunction of bladder, unspecified: Secondary | ICD-10-CM | POA: Insufficient documentation

## 2015-11-05 DIAGNOSIS — D0502 Lobular carcinoma in situ of left breast: Secondary | ICD-10-CM | POA: Insufficient documentation

## 2015-11-05 NOTE — Patient Instructions (Signed)
We will call you with the results of the biopsy from today.  Plan to follow up in three months or sooner if needed.

## 2015-11-05 NOTE — Progress Notes (Signed)
Follow Up Note: Gyn-Onc  Crystal [name] 45 y.o. female  CC:  Chief Complaint  Patient presents with  . Cervical Cancer    Follow up   Assessment/Plan:  45 year old s/p robotic-assisted type III radical laparoscopic hysterectomy with bilateral salpingectomy and bilateral sentinel lymph node biopsy on 07/01/15.  No residual carcinoma on hysterectomy specimen. Low risk features to her tumor therefore no adjuvant therapy recommended. No evidence for recurrence on today's exam - will followup biopsy result, though likely granulation tissue.   1/ we will continue annual pap surveillance for this with hpv cotesting.  2/ I discussed symptoms concerning for recurrence including vaginal bleeding or discharge, pelvic pain, cough, or lower extremity edema.  3/ neurogenic bladder which is a common side effect from radical hysterectomy. She no longer requires to self catheterize though she is sometimes straining to completely empty  4/ chronic constipation - counseled regarding stool softeners.   I will see her back in 3 months.   HPI: Crystal [name] is a 45 year old female initially referred by Dr. 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  She had her first abnormal Pap smear on 02/26/2015 which was HGSIL. She then underwent colposcopic evaluation of the cervix on 04/02/2015, which was benign in appearance. A biopsy from the ectocervix was benign and endocervical curetting showed CIN-3. As follow-up, she underwent a LEEP procedure on 05/12/2015. 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 Dimensions of the carcinoma were not included however there was a focus of dysplasia suspicious for invasive carcinoma involving the deep margin.  PET on 06/20/15: IMPRESSION: 1. No primary hypermetabolic primary cervical mass or evidence of nodal metastasis. 2. Focal hypermetabolism in the lateral  left breast. No CT correlate. Consider correlation with diagnostic mammography and ultrasound to exclude otherwise occult breast lesion. 3. Probable left nephrolithiasis  On 07/01/15, she underwent a robotic-assisted type III radical laparoscopic hysterectomy with bilateral salpingoophorectomy and bilateral pelvic lymphadenectomy by Dr. Denman George.  Final pathology revealed: Diagnosis 1. Uterus +/- tubes/ovaries, neoplastic, cervix CERVIX WITH LOW GRADE SQUAMOUS INTRAEPITHELIAL LESION, CIN-1 NO RESIDUAL HIGH GRADE INTRAEPITHELIAL LESION OR INVASIVE NEOPLASM IDENTIFIED VAGINAL CUFF, PARAMETRIAL AND ALL RESECTION MARGINS ARE NEGATIVE FOR TUMOR ENDOMETRIAL POLYP AND INACTIVE ENDOMETRIUM MYOMETRIUM: NO PATHOLOGICAL ALTERATIONS BILATERAL FALLOPIAN TUBES: HISTOLOGICAL UNREMARKABLE 2. Lymph node, sentinel, biopsy, right external iliac ONE BENIGN LYMPH NODE (0/1) 3. Lymph node, sentinel, biopsy, left obturator ONE BENIGN LYMPH NODE (0/1)  She was re-admitted to the hospital on POD 7, 07/08/15, for persistent nausea, vomiting, dehydration, no BM, dyspnea.  CT CAP on 07/08/15 failed to demonstrate evidence for intraperitoneal abscess/GU or GI injury or pathology including no obstruction. UA and culture revealed a catheter associated UTI (e coli - pan sensitive) which was treated with cipro. She was discharged home on 07/10/15 after IV hydration, aggressive bowel regimen.  She failed a voiding trial during her hospitalization and the foley had to be replaced due to urinary retention.  She presented to the office on 07/15/15 for a repeat voiding trial. She passed this well and her foley was left out, however she subsequently developed urinary retention and required teaching to intermittently self catheterize.  Interval History:   She denies vaginal bleeding, LE edema. She does have constipation. She needs to strain to void urine but no longer catheterizes. On 10/03/2015 she underwent a breast lumpectomy on the  left which confirmed lobular hyperplasia but no  carcinoma in situ identified. No additional therapy is required for this.  Review of Systems  Constitutional: Feels well.  No fever, chills, early satiety, or unintentional weight loss or gain.  Cardiovascular: No chest pain, shortness of breath, or edema.  Pulmonary: No cough or wheeze.  Gastrointestinal: No nausea, vomiting, or diarrhea. No bright red blood per rectum or change in bowel movement. + constipation Genitourinary: Minimal bladder sensation. No vaginal bleeding or discharge.  Musculoskeletal: No myalgia or joint pain. Neurologic: No weakness, numbness, or change in gait.  Psychology: No depression, anxiety, or insomnia.  Current Meds:  Outpatient Encounter Prescriptions as of 11/05/2015  Medication Sig  . acetaminophen (TYLENOL) 325 MG tablet Take 650 mg by mouth every 6 (six) hours as needed. Reported on 11/03/2015  . b complex vitamins capsule Take 1 capsule by mouth daily.  . Methylcellulose, Laxative, (CITRUCEL) 500 MG TABS Take by mouth.  . NON FORMULARY Zeal for life focus energy health drink  . [DISCONTINUED] HYDROcodone-acetaminophen (NORCO) 5-325 MG tablet Take 1-2 tablets by mouth every 6 (six) hours as needed for moderate pain or severe pain. (Patient not taking: Reported on 11/03/2015)   No facility-administered encounter medications on file as of 11/05/2015.    Allergy: No Known Allergies  Social Hx:   Social History   Social History  . Marital Status: Widowed    Spouse Name: N/A  . Number of Children: N/A  . Years of Education: N/A   Occupational History  . Not on file.   Social History Main Topics  . Smoking status: Never Smoker   . Smokeless tobacco: Never Used  . Alcohol Use: No     Comment: occasionally  . Drug Use: No  . Sexual Activity: Yes    Birth Control/ Protection: Surgical   Other Topics Concern  . Not on file   Social History Narrative    Past Surgical Hx:  Past Surgical History   Procedure Laterality Date  . Cesarean section      2 previous  . Cervical biopsy  w/ loop electrode excision      12'16  . Robotic assisted total hysterectomy Bilateral 07/01/2015    Procedure: XI ROBOTIC ASSISTED RADICAL TYPE II HYSTERECTOMY, BILATERAL SALPINGECTOMY, SENTINAL LYMPH NODE BIOPSY;  Surgeon: Everitt Amber, MD;  Location: [facility];  Service: Gynecology;  Laterality: Bilateral;  . Breast lumpectomy with radioactive seed localization Left 10/03/2015    Procedure: LEFT BREAST LUMPECTOMY WITH RADIOACTIVE SEED LOCALIZATION;  Surgeon: Fanny Skates, MD;  Location: [facility];  Service: General;  Laterality: Left;    Past Medical Hx:  Past Medical History  Diagnosis Date  . Cancer ([place])     cervical cancer dx.   . Kidney stones     cyst on kidney -no problems  . History of kidney stones     x1   . PONV (postoperative nausea and vomiting)   . 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 Hypertension Sister     Vitals:  Blood pressure 95/61, pulse 89, temperature 98.8 F (37.1 C), temperature source Oral, resp. rate 18, height 4\' 11"  (1.499 m), weight 106 lb 12.8 oz (48.444 kg), last menstrual period 06/07/2015, SpO2 99 %.  Physical Exam:  General: Well developed, well nourished female in no acute distress. Alert and oriented x 3.  Cardiovascular: Regular rate and rhythm. S1 and S2 normal.  Lungs: Clear to auscultation  bilaterally. No wheezes/crackles/rhonchi noted.  Skin: No rashes or lesions present. Back: No CVA tenderness.  Abdomen: Abdomen soft, non-tender and non-obese. Active bowel sounds in all quadrants. No evidence of a fluid wave or abdominal masses.  Lap sites healing well without erythema or drainage.  Extremities: No bilateral cyanosis, edema, or clubbing.  Pelvic exam: At the apex of the vaginal cuff was a 2cm area of frond like friable tissue consistent with granulation tissue.  Biopsy performed.  PROCEDURE: vaginal biopsy The patient provided verbal consent for the procedure. The friable vaginal tissue was grasped with a Kevorkian biopsy forcep. It was sent for bladder pathology. Hemostasis was obtained with silver nitrate. In the process of doing this the residual granulation tissue spontaneously separated from the vaginal cuff. Hemostasis was observed. The patient tolerated procedure well.   Donaciano Eva, MD  11/05/2015, 6:25 PM

## 2015-11-07 ENCOUNTER — Telehealth: Payer: Self-pay | Admitting: Gynecologic Oncology

## 2015-11-07 NOTE — Telephone Encounter (Signed)
Patient's daughter called reporting light brown spotting after the biopsy per her mother.  Informed her that can be normal after having a biopsy and it should resolve.  Also informed patient of biopsy results.  No concerns voiced.  Advised to call if symptoms persist.

## 2015-11-18 ENCOUNTER — Telehealth: Payer: Self-pay | Admitting: *Deleted

## 2015-11-18 NOTE — Telephone Encounter (Signed)
Patient verified DOB Patient is aware of lipid and blood being normal. No further questions at this time.

## 2015-12-02 ENCOUNTER — Telehealth (HOSPITAL_COMMUNITY): Payer: Self-pay | Admitting: *Deleted

## 2015-12-02 NOTE — Telephone Encounter (Signed)
Patient called wanting to know when next mammogram is due, Advised would need to check with Altha Harm and call back. Patient also stating has lots of bills from the [facility]. Advised patient would need to see financial counselors to see what options were available for her. Patient voiced understanding. Used interpreter Anastasio Auerbach

## 2016-02-02 ENCOUNTER — Encounter: Payer: Self-pay | Admitting: Gynecologic Oncology

## 2016-02-02 ENCOUNTER — Ambulatory Visit: Payer: No Typology Code available for payment source | Attending: Gynecologic Oncology | Admitting: Gynecologic Oncology

## 2016-02-02 VITALS — BP 102/66 | HR 66 | Temp 97.9°F | Resp 18 | Ht 59.0 in | Wt 109.3 lb

## 2016-02-02 DIAGNOSIS — C539 Malignant neoplasm of cervix uteri, unspecified: Secondary | ICD-10-CM | POA: Insufficient documentation

## 2016-02-02 DIAGNOSIS — Z8249 Family history of ischemic heart disease and other diseases of the circulatory system: Secondary | ICD-10-CM | POA: Insufficient documentation

## 2016-02-02 DIAGNOSIS — Z90722 Acquired absence of ovaries, bilateral: Secondary | ICD-10-CM | POA: Insufficient documentation

## 2016-02-02 DIAGNOSIS — K5904 Chronic idiopathic constipation: Secondary | ICD-10-CM

## 2016-02-02 DIAGNOSIS — Z87442 Personal history of urinary calculi: Secondary | ICD-10-CM | POA: Insufficient documentation

## 2016-02-02 DIAGNOSIS — K5909 Other constipation: Secondary | ICD-10-CM | POA: Insufficient documentation

## 2016-02-02 DIAGNOSIS — N319 Neuromuscular dysfunction of bladder, unspecified: Secondary | ICD-10-CM

## 2016-02-02 DIAGNOSIS — Z8541 Personal history of malignant neoplasm of cervix uteri: Secondary | ICD-10-CM

## 2016-02-02 DIAGNOSIS — Z90710 Acquired absence of both cervix and uterus: Secondary | ICD-10-CM

## 2016-02-02 DIAGNOSIS — Z853 Personal history of malignant neoplasm of breast: Secondary | ICD-10-CM | POA: Insufficient documentation

## 2016-02-02 NOTE — Progress Notes (Signed)
Follow Up Note: Gyn-Onc  Crystal [name] 45 y.o. female  CC:  Chief Complaint  Patient presents with  . squamous cell carcinoma of cervix    follow up visit   Assessment/Plan:  45 year old s/p robotic-assisted type III radical laparoscopic hysterectomy with bilateral salpingectomy and bilateral sentinel lymph node biopsy on 07/01/15.  No residual carcinoma on hysterectomy specimen. Low risk features to her tumor therefore no adjuvant therapy recommended. No evidence for recurrence on today's exam.   1/ we will continue annual pap surveillance for this with hpv cotesting (next due in March, 2018).  2/ I discussed symptoms concerning for recurrence including vaginal bleeding or discharge, pelvic pain, cough, or lower extremity edema.  3/ neurogenic bladder which is a common side effect from radical hysterectomy. Improved. No intervention necessary  4/ chronic constipation - counseled regarding stool softeners (daily miralax).   5/ Follow-up for well woman care with Dr Harolyn Rutherford in 3 months and I will see her back in 6 months.   HPI: Crystal [name] is a 45 year old female initially referred by Dr. 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  She had her first abnormal Pap smear on 02/26/2015 which was HGSIL. She then underwent colposcopic evaluation of the cervix on 04/02/2015, which was benign in appearance. A biopsy from the ectocervix was benign and endocervical curetting showed CIN-3. As follow-up, she underwent a LEEP procedure on 05/12/2015. 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 Dimensions of the carcinoma were not included however there was a focus of dysplasia suspicious for invasive carcinoma involving the deep margin.  PET on 06/20/15: IMPRESSION: 1. No primary hypermetabolic primary cervical mass or evidence of nodal metastasis. 2. Focal hypermetabolism in the  lateral left breast. No CT correlate. Consider correlation with diagnostic mammography and ultrasound to exclude otherwise occult breast lesion. 3. Probable left nephrolithiasis  On 07/01/15, she underwent a robotic-assisted type III radical laparoscopic hysterectomy with bilateral salpingoophorectomy and bilateral pelvic lymphadenectomy by Dr. Denman George.  Final pathology revealed: Diagnosis 1. Uterus +/- tubes/ovaries, neoplastic, cervix CERVIX WITH LOW GRADE SQUAMOUS INTRAEPITHELIAL LESION, CIN-1 NO RESIDUAL HIGH GRADE INTRAEPITHELIAL LESION OR INVASIVE NEOPLASM IDENTIFIED VAGINAL CUFF, PARAMETRIAL AND ALL RESECTION MARGINS ARE NEGATIVE FOR TUMOR ENDOMETRIAL POLYP AND INACTIVE ENDOMETRIUM MYOMETRIUM: NO PATHOLOGICAL ALTERATIONS BILATERAL FALLOPIAN TUBES: HISTOLOGICAL UNREMARKABLE 2. Lymph node, sentinel, biopsy, right external iliac ONE BENIGN LYMPH NODE (0/1) 3. Lymph node, sentinel, biopsy, left obturator ONE BENIGN LYMPH NODE (0/1)  She was re-admitted to the hospital on POD 7, 07/08/15, for persistent nausea, vomiting, dehydration, no BM, dyspnea.  CT CAP on 07/08/15 failed to demonstrate evidence for intraperitoneal abscess/GU or GI injury or pathology including no obstruction. UA and culture revealed a catheter associated UTI (e coli - pan sensitive) which was treated with cipro. She was discharged home on 07/10/15 after IV hydration, aggressive bowel regimen.  She failed a voiding trial during her hospitalization and the foley had to be replaced due to urinary retention.  She presented to the office on 07/15/15 for a repeat voiding trial. She passed this well and her foley was left out, however she subsequently developed urinary retention and required teaching to intermittently self catheterize.  On 10/03/2015 she underwent a breast lumpectomy on the left which confirmed lobular hyperplasia but no carcinoma in situ identified. No additional therapy is required for this.  Interval History:    She denies vaginal bleeding, LE  edema. She does have constipation. She needs to strain to void urine but no longer catheterizes. She continues to need to strain to have bowel movements.  Review of Systems  Constitutional: Feels well.  No fever, chills, early satiety, or unintentional weight loss or gain.  Cardiovascular: No chest pain, shortness of breath, or edema.  Pulmonary: No cough or wheeze.  Gastrointestinal: No nausea, vomiting, or diarrhea. No bright red blood per rectum or change in bowel movement. + constipation Genitourinary: Minimal bladder sensation. No vaginal bleeding or discharge.  Musculoskeletal: No myalgia or joint pain. Neurologic: No weakness, numbness, or change in gait.  Psychology: No depression, anxiety, or insomnia.  Current Meds:  Outpatient Encounter Prescriptions as of 02/02/2016  Medication Sig  . acetaminophen (TYLENOL) 325 MG tablet Take 650 mg by mouth every 6 (six) hours as needed. Reported on 11/03/2015  . b complex vitamins capsule Take 1 capsule by mouth daily.  . Methylcellulose, Laxative, (CITRUCEL) 500 MG TABS Take by mouth.  . [DISCONTINUED] NON FORMULARY Zeal for life focus energy health drink   No facility-administered encounter medications on file as of 02/02/2016.     Allergy: No Known Allergies  Social Hx:   Social History   Social History  . Marital status: Widowed    Spouse name: N/A  . Number of children: N/A  . Years of education: N/A   Occupational History  . Not on file.   Social History Main Topics  . Smoking status: Never Smoker  . Smokeless tobacco: Never Used  . Alcohol use No     Comment: occasionally  . Drug use: No  . Sexual activity: Yes    Birth control/ protection: Surgical   Other Topics Concern  . Not on file   Social History Narrative  . No narrative on file    Past Surgical Hx:  Past Surgical History:  Procedure Laterality Date  . BREAST LUMPECTOMY WITH RADIOACTIVE SEED LOCALIZATION Left  10/03/2015   Procedure: LEFT BREAST LUMPECTOMY WITH RADIOACTIVE SEED LOCALIZATION;  Surgeon: Fanny Skates, MD;  Location: [facility];  Service: General;  Laterality: Left;  . CERVICAL BIOPSY  W/ LOOP ELECTRODE EXCISION     12'16  . CESAREAN SECTION     2 previous  . ROBOTIC ASSISTED TOTAL HYSTERECTOMY Bilateral 07/01/2015   Procedure: XI ROBOTIC ASSISTED RADICAL TYPE II HYSTERECTOMY, BILATERAL SALPINGECTOMY, SENTINAL LYMPH NODE BIOPSY;  Surgeon: Everitt Amber, MD;  Location: [facility];  Service: Gynecology;  Laterality: Bilateral;    Past Medical Hx:  Past Medical History:  Diagnosis Date  . Cancer ([place])    cervical cancer dx.   . History of kidney stones    x1   . Kidney stones    cyst on kidney -no problems  . 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 PONV (postoperative nausea and vomiting)     Family Hx:  Family History  Problem Relation Age of Onset  . Hypertension Sister     Vitals:  Blood pressure 102/66, pulse 66, temperature 97.9 F (36.6 C), temperature source Oral, resp. rate 18, height 4\' 11"  (1.499 m), weight 109 lb 4.8 oz (49.6 kg), last menstrual period 06/07/2015, SpO2 100 %.  Physical Exam:  General: Well developed, well nourished female in no acute distress. Alert and oriented x 3.  Cardiovascular: Regular rate and rhythm. S1 and S2 normal.  Lungs: Clear to auscultation bilaterally. No wheezes/crackles/rhonchi noted.  Skin: No rashes or lesions present. Back: No CVA tenderness.  Abdomen: Abdomen soft, non-tender and non-obese. Active bowel sounds in all quadrants. No evidence of a fluid wave or abdominal masses.  Lap sites healed Extremities: No bilateral cyanosis, edema, or clubbing.  Pelvic exam: No residual granulation tissue at cuff. No nodulariy or lesions. Rectal: smooth, no nodularity or masses   Donaciano Eva, MD  02/02/2016, 2:25 PM

## 2016-02-02 NOTE — Patient Instructions (Signed)
Plan to follow up with Dr. Harolyn Rutherford in three months and Dr. Denman George in six months.  Dr. Arther Abbott number is [phone].  After you see Dr. Harolyn Rutherford, please call our office to schedule an appt with Dr. Denman George in March 2018.  Begin taking Miralax once daily for your bowels.  You can buy this over the counter at your drug store.  Polyethylene Glycol powder Qu es este medicamento? El polvo de POLIETILENGLICOL [id number] en un laxante que se [place] para tratar el estreimiento. Este medicamento aumenta la cantidad de [employer]. Esto hace que las evacuaciones intestinales se produzcan con mayor facilidad y frecuencia. Este medicamento puede ser utilizado para otros usos; si tiene alguna pregunta consulte con su proveedor de atencin mdica o con su farmacutico. Qu le debo informar a mi profesional de la salud antes de tomar este medicamento? Necesita saber si usted presenta alguno de los siguientes problemas o situaciones: -antecedentes de bloqueo estomacal o intestinal -distensin o dolor abdominal actual -dificultad para tragar -diverticulitis, colitis ulcerosa u otra enfermedad intestinal crnica -fenilcetonuria -una reaccin alrgica o inusual al polietilenglicol, a otros medicamentos, colorantes o conservantes -si est embarazada o buscando quedar embarazada -si est amamantando a un beb Cmo debo utilizar este medicamento? Tome este medicamento por va oral. El frasco tiene [place] tapa de medicin marcada con una lnea. Vierta el polvo en la tapa hasta alcanzar la lnea marcada (la dosis equivale aproximadamente a 1 cucharada sopera). Agregue el polvo de la tapa a un vaso lleno (4-8 onzas o 120-240 ml) de agua, jugo, soda, caf o t. Mezcle bien el polvo. Beba la solucin. Tmela exactamente como se indica. No tome su medicamento con una frecuencia mayor que la indicada. Hable con su pediatra para informarse acerca del uso de este medicamento en nios. Puede requerir atencin  especial. Sobredosis: Pngase en contacto inmediatamente con un centro toxicolgico o una sala de urgencia si usted cree que haya tomado demasiado medicamento. ATENCIN: [employer] es solo para usted. No comparta este medicamento con nadie. Qu sucede si me olvido de una dosis? Si olvida una dosis, tmela lo antes posible. Si es casi la hora de la prxima dosis, tome slo esa dosis. No tome dosis adicionales o dobles. Qu puede interactuar con este medicamento? No se esperan interacciones. Puede ser que esta lista no menciona todas las posibles interacciones. Informe a su profesional de [employer]	[place] de [employer] productos a base de hierbas, medicamentos de [place] o suplementos nutritivos que est tomando. Si usted fuma, consume bebidas alcohlicas o si utiliza drogas ilegales, indqueselo tambin a su profesional de [employer]	[place]. Algunas sustancias pueden interactuar con su medicamento. A qu debo estar atento al usar [employer]? No lo utilice durante ms de 2 semanas sin consultar a su mdico o a su profesional de [employer]	[place]. Puede ser necesario que transcurran de 2 a 4 das hasta que se produzca una evacuacin intestinal y se observe una mejora en el estreimiento. Consulte a su profesional de la salud si observa cambios en sus hbitos intestinales, incluyendo el estreimiento, que sean severos o que duren ms de 3 semanas. Tome siempre este medicamento con agua en abundancia. Qu efectos secundarios puedo tener al [employer] este medicamento? Efectos secundarios que debe informar a su mdico o a Barrister's clerk de la salud tan pronto como sea posible: -diarrea -dificultad al respirar -picazn de la piel, urticarias o erupcin cutnea -hinchazn, dolor o distensin de estmago severo -vmito Efectos secundarios que, por lo general, no  requieren atencin mdica (debe informarlos a su mdico o a su profesional de la salud si persisten o si son molestos): -sensacin de llenura o  gases -molestias o calambres en la parte baja del abdomen -nuseas Puede ser que esta lista no menciona todos los posibles efectos secundarios. Comunquese a su mdico por asesoramiento mdico [employer] Usted puede informar los efectos secundarios a la FDA por telfono al 1-800-FDA-1088. Dnde debo guardar mi medicina? Mantngala fuera del alcance de los nios. Gurdela a una temperatura de entre 15 y 67 grados C (37 y 77 grados F). Deseche todo el medicamento que no haya utilizado, despus de la fecha de vencimiento. ATENCIN: Este folleto es un resumen. Puede ser que no cubra toda la posible informacin. Si usted tiene preguntas acerca de esta medicina, consulte con su mdico, su farmacutico o su profesional de Technical sales engineer.    2016, [employer]. (2014-07-02 00:00:00)

## 2016-02-03 ENCOUNTER — Encounter: Payer: Self-pay | Admitting: Internal Medicine

## 2016-02-03 ENCOUNTER — Ambulatory Visit: Payer: Self-pay | Attending: Internal Medicine | Admitting: Internal Medicine

## 2016-02-03 VITALS — BP 103/68 | HR 78 | Temp 97.9°F | Resp 16 | Wt 111.2 lb

## 2016-02-03 DIAGNOSIS — K5900 Constipation, unspecified: Secondary | ICD-10-CM | POA: Insufficient documentation

## 2016-02-03 DIAGNOSIS — K068 Other specified disorders of gingiva and edentulous alveolar ridge: Secondary | ICD-10-CM | POA: Insufficient documentation

## 2016-02-03 DIAGNOSIS — K219 Gastro-esophageal reflux disease without esophagitis: Secondary | ICD-10-CM | POA: Insufficient documentation

## 2016-02-03 DIAGNOSIS — Z79899 Other long term (current) drug therapy: Secondary | ICD-10-CM | POA: Insufficient documentation

## 2016-02-03 DIAGNOSIS — Z23 Encounter for immunization: Secondary | ICD-10-CM | POA: Insufficient documentation

## 2016-02-03 DIAGNOSIS — K649 Unspecified hemorrhoids: Secondary | ICD-10-CM | POA: Insufficient documentation

## 2016-02-03 DIAGNOSIS — C539 Malignant neoplasm of cervix uteri, unspecified: Secondary | ICD-10-CM | POA: Insufficient documentation

## 2016-02-03 MED ORDER — SENNA 8.6 MG PO TABS: 1.0000 | ORAL_TABLET | Freq: Every day | ORAL | 2 refills | Status: DC | PRN

## 2016-02-03 MED ORDER — FAMOTIDINE 20 MG PO TABS: 20.0000 mg | ORAL_TABLET | Freq: Two times a day (BID) | ORAL | 1 refills | Status: DC

## 2016-02-03 MED FILL — FAMOTIDINE 20 MG TABLET: 20 | 30 days supply | Qty: 60 | Fill #0

## 2016-02-03 NOTE — Progress Notes (Signed)
Pt is in the office today for establish care  Pt states her pain level today in the office is a 3 Pt states her pain is coming from her sides and beside he kidneys Pt states the pain has been going on for 3 months Pt states in the mornings she has a lot of constipation and she has blood in her mouth

## 2016-02-03 NOTE — Patient Instructions (Addendum)
Probiotics/  Yogurt Activia 2 time day  Dieta con bajo contenido de fibra (Low-Fiber Diet) La fibra se encuentra en las frutas, las verduras y los cereales integrales. Una dieta con bajo contenido de fibra restringe los alimentos que las contienen y que no son digeridos en el intestino delgado. Se considera que una dieta que aporta de 10a 15gramos de [place] por da es una dieta con bajo contenido de [place]. Las dietas con bajo contenido de fibra pueden usarse con estos fines:  Chief Technology Officer y Regulatory affairs officer del intestino durante las exacerbaciones intestinales.  Evitar una obstruccin del tracto gastrointestinal parcialmente obstruido o estrecho.  Reducir el volumen y Financial controller fecal.  Hacer ms lento el movimiento de la materia fecal. Se puede estar en una dieta con bajo contenido de [place] como transicin despus de una ciruga o una lesin (traumatismo), o debido a una enfermedad de corta duracin (aguda) o de larga duracin (crnica). El mdico determinar durante cunto tiempo deber seguir esta dieta.  QU DEBO SABER ACERCA DE [place]? Verifique siempre el contenido de fibra en la etiqueta de informacin nutricional del envase, especialmente para los alimentos de la lista de los granos. Consulte al nutricionista si tiene preguntas sobre algunos alimentos especficos relacionados con su enfermedad, especialmente si estos alimentos no se mencionan a continuacin. En general, un alimento con bajo contenido de fibra tiene menos de 2gramos de [place]. QU ALIMENTOS PUEDO COMER? Cereales [employer] panes y las galletas saladas elaborados con harina blanca. Panecillos dulces, donas, waffles, panqueques, tostadas francesas, rosquillas. Pretzels, tostadas delgadas ([place]), biscotes Cereales bien cocidos, como harina de maz, smola o crema de cereales. Cereales secos que no contengan cereales integrales, frutas ni frutos secos, como cereales con avena, arroz, trigo y  maz refinado. Papas preparadas de cualquier manera, pero sin piel; fideos y pastas al natural; arroz blanco refinado. Use harina blanca para hornear y preparar salsas. Use la lista permitida de granos para los guisos, las albndigas de masa y los budines.  Vegetales Jugos colados de tomates o de verduras. Valeda Malm, pepino o espinaca frescos. Verduras bien cocidas o enlatadas (sin piel ni pulpa), como esprragos, brotes de soja, remolachas, zanahorias, judas verdes, championes, papas, zapallo, espinaca, calabacita amarilla, pur/salsa de tomate, nabos, batatas y calabacines. Limite las porciones a  taza.  [address] [employer] jugos de fruta, excepto el jugo de Development worker, community. Frutas cocidas o enlatadas sin piel ni semillas, como compota de [place], damascos, cerezas, cctel de frutas, pomelos, uvas, mandarinas, melones, duraznos, peras, pia y ciruelas. Frutas frescas sin cscara, como damascos, aguacates, bananas, melones, pia, nectarinas y duraznos. Limite las porciones a taza o 1unidad.  Carne y otras fuentes de protenas Carne molida o un bife tierno bien cocido, jamn, ternera, cordero, cerdo o aves. Huevos, queso. Pescado, ostras, langostinos, [place] y otros frutos de mar. Hgado y otros rganos. [place] de frutos secos. Lcteos [employer] productos lcteos y los sustitutos lcteos alternativos, como soja, arroz, almendras y [place], sin agregado de frutos secos enteros, semillas ni fruta adicional. Bebidas Caf descafeinado, jugos de frutas y verduras, batidos (pequeas cantidades, sin pulpa ni piel, y con frutas de la lista permitida), bebidas deportivas, t de hierbas. Condimentos [place], [place], vinagre, salsa de [place], salsa de [place], cacao en polvo. Especias con moderacin, como pimienta de [place], [place], laurel, polvo u hojas de apio, canela, comino en polvo, polvo de curry, jengibre, macis, mejorana, cebolla o ajo en polvo, organo, pimentn, perejil, pimienta molida, romero, salvia,  Mike Craze, tomillo y crcuma. Dulces y postres [place] y Administrator, pasteles hechos con frutas permitidas, budines, natillas, pasteles con crema. Gelatina, frutas, hielo, sorbetes, helados de agua. Helados, batidos sin frutos secos. Caramelos duros, miel, gelatina, melaza, jarabes, azcar, jarabe de chocolate, pastillas de goma, malvaviscos. Limite el consumo total de azcar.  Grasas y [place], [place], [place], [place], aceites para [place], aderezos para ensaladas hechos con alimentos permitidos. Cuando sea posible, elija grasas saludables, como aceite de [place], aceite de canola y cidos grasos omega-3 (como los que se encuentran en el salmn o el atn).  Otros Consom, caldo o sopas hechas con los alimentos permitidos. Cualquier sopa colada. Cazuelas o platos combinados preparados con alimentos permitidos. Los artculos mencionados arriba pueden no ser [employer] Company de las bebidas o los alimentos recomendados. Consulte a su nutricionista para conocer ms opciones. QU ALIMENTOS NO ESTN RECOMENDADOS? Cereales Todo tipo de McDonald[employer] Corporation y panes integrales y de trigo integral. Multicereales, centeno, salvado, frutos secos o coco. Cereales que contengan cereales integrales, multigranos, salvado, coco, frutos secos o pasas de uva. Avena o granos de avena cortados, cocidos o secos. Cereales de grano grueso, granola. Cereales promocionados como con alto contenido de [place]. Cscara de patatas. Pasta integral, Jerelene Redden, arroz salvaje. Palomitas de maz. Harina de [place], trigo sarraceno, pan de harina de maz, multicereales, centeno, germen de trigo.  Vegetales Verduras frescas, cocidas o enlatadas, como alcachofas, esprrago, hojas de remolacha, brcoli, repollitos de [place], col, apio, coliflor, maz, berenjenas, col rizada, legumbres o frijoles, quimbomb, guisantes y tomates. Evite porciones grandes de todas las verduras, en especial, de las crudas.  Frutas Frutas  frescas, como manzanas con o sin cscara, frutos rojos, cerezas, higos, uvas, pomelo, [place], kiwis, [place], [place], [place], peras, caquis, pia y [place]. Jugo de ciruela y jugos con pulpa, ciruelas en compota o secas. Frutas secas, pasas de uva, dtiles. [place] frutas. Evite porciones grandes de todas las frutas frescas. Carnes y otras fuentes de protenas Carnes duras y fibrosas con Database administrator. [place] de man espesa. Quesos elaborados con semillas, frutos secos u otros alimentos no recomendados. Frutos secos, semillas, legumbres (frijoles, incluso los frijoles en salsa), lentejas, frijoles, guisantes secos.  Lcteos Yogur o queso que contengan frutos secos, semillas o frutos agregados.  Bebidas Jugos de fruta con pulpa, jugo de ciruelas. T y caf no descafeinados.  Condimentos Coco, jarabe de arce, pickles, aceitunas. Dulces y [employer], galletas o golosinas que contengan frutos secos o [place], [place] de man en trozos, frutas secas. Mermeladas, conservas con semillas. Grandes cantidades de azcar y dulces. Cualquier otro postre elaborado con frutas de la lista no recomendada.  Otros [place] preparadas con verduras no recomendadas o que contengan otros alimentos no recomendados.  Esta no es [employer] Foods Company de los alimentos y las bebidas que Nurse, adult. Consulte a su nutricionista para obtener ms informacin.   Esta informacin no tiene Marine scientist el consejo del mdico. Asegrese de hacerle al mdico cualquier pregunta que tenga.   Document Released: 05/10/2005 Document Revised: 05/15/2013 Elsevier Interactive Patient Education 2016 [place] por reflujo gastroesofgico en los adultos (Gastroesophageal Reflux Disease, Adult) Normalmente, los alimentos descienden por el esfago y se depositan en el estmago para su digestin. Si una persona tiene enfermedad por reflujo gastroesofgico (ERGE), los alimentos y el cido  estomacal regresan al esfago. Cuando esto ocurre, el esfago se irrita y se hincha (inflama). Con el tiempo, la [employer]ilent Technologies puede provocar la formacin  de pequeas perforaciones (lceras) en la mucosa del esfago. CUIDADOS EN EL HOGAR Dieta  Siga la dieta como se lo haya indicado el mdico. Tal vez deba evitar los siguientes alimentos y bebidas:  Caf y t (con o sin cafena).  Bebidas que contengan alcohol.  Bebidas energizantes y deportivas.  Gaseosas o refrescos.  Chocolate y cacao.  Menta y [place].  Ajo y cebollas.  Rbano picante.  Alimentos muy condimentados y cidos, como pimientos, [place] en polvo, curry en polvo, vinagre, salsas picantes y Engineering geologist.  Frutas ctricas y sus jugos, como naranjas, limones y limas.  Alimentos a base de tomates, como salsa roja, [place], salsa y pizza con salsa roja.  Alimentos fritos y Radio broadcast assistant, como rosquillas, papas fritas y aderezos con alto contenido de Lobbyist.  Carnes con alto contenido de [place], como hot dogs, filetes de entrecot, salchicha, jamn y tocino.  Productos lcteos con alto contenido de [place], como [place], [place] y queso crema.  Consuma pequeas porciones de comida con ms frecuencia. Evite consumir porciones abundantes.  Evite beber [place] comidas.  No coma durante las 2 o 3horas previas a la hora de [place].  No se acueste inmediatamente despus de comer.  No haga actividad fsica enseguida despus de comer. Instrucciones generales  Est atento a cualquier cambio en los sntomas.  Tome los medicamentos de venta libre y los recetados solamente como se lo haya indicado el mdico. No tome aspirina, ibuprofeno ni otros antiinflamatorios no esteroides (AINE), a menos que el mdico lo autorice.  No consuma ningn producto que contenga tabaco, lo que incluye cigarrillos, tabaco de Higher education careers adviser y Psychologist, sport and exercise. Si necesita ayuda para dejar de fumar, consulte al mdico.  Use  ropa suelta. No use nada ajustado alrededor Parker Hannifin.  Levante (eleve) unas 6pulgadas (15centmetros) la cabecera de la cama.  Intente bajar el nivel de estrs. Si necesita ayuda para hacerlo, consulte al [employer]aco.  Si tiene sobrepeso, Multimedia programmer un peso saludable. Pregntele a su mdico cmo puede perder peso de manera segura.  Concurra a todas las visitas de control como se lo haya indicado el mdico. Esto es importante. SOLICITE AYUDA SI:  Aparecen nuevos sntomas.  [place] y no sabe por qu.  Tiene dificultad para tragar o siente dolor al [employer].  Tiene sibilancias o tos que no desaparece.  Los sntomas no mejoran con Dispensing optician.  Tiene la voz ronca. SOLICITE AYUDA DE INMEDIATO SI:  Tiene dolor en los brazos, el cuello, los [place], la dentadura o la espalda.  Philbert Riser, se marea o tiene sensacin de desvanecimiento.  Siente falta de aire o Tourist information centre manager.  Vomita y el vmito es parecido a la sangre o a los granos de caf.  Pierde el conocimiento (se desmaya).  Las heces son sanguinolentas o de color negro.  No puede tragar, beber o comer.   Esta informacin no tiene Marine scientist el consejo del mdico. Asegrese de hacerle al mdico cualquier pregunta que tenga.   Document Released: 06/12/2010 Document Revised: 01/29/2015 Elsevier Interactive Patient Education 2016 [place]  (Hemorrhoids)  Las hemorroides son venas inflamadas (hinchadas) alrededor del recto o del ano. Las hemorroides causan dolor, picazn, sangrado o irritacin.  CUIDADOS EN EL HOGAR   Consuma alimentos con fibra, como cereales integrales, legumbres, frutos secos, frutas y verduras. Pregntele a su mdico acerca de tomar productos con fibra aadida en ellos (suplementos defibra).  Beba gran cantidad  de lquido para Eastman Kodak (orina) de tono claro o amarillo plido.  Haga ejercicio a menudo.  Vaya al bao cuando sienta  la necesidad de ir de cuerpo. No espere.  Evite hacer fuerza al ir de cuerpo Architectural technologist intestino).Colin Rhein la zona anal limpia y seca. Use papel higinico mojado o toallitas de papel humedecidas.  [place] cremas recetadas y los medicamentos que se aplican en el ano (supositorio anal )  Slo tome los medicamentos que le indique el mdico.  Tome un bao de agua tibia (bao de asiento) durante 15 a 20 minutos para Best boy. Repita 3  4 veces por da.  Coloque una bolsa de hielo sobre la zona si le duele o est inflamada. Use compresas de hielo [employer]Corp de agua tibia.  Ponga el hielo en una bolsa plstica.  Colquese una toalla entre la piel y la bolsa de hielo.  Deje el hielo en el lugar durante 15 a 20 minutos, 3 a 4 veces por da.  No utilice una almohada en forma de aro ni se siente en el inodoro durante perodos prolongados. SOLICITE AYUDA DE INMEDIATO SI:   Aumenta el dolor y no puede controlarlo con la medicacin o con Lexicographer.  Tiene una hemorragia que no se detiene.  Tiene dificultado o no puede ir de cuerpo Architectural technologist intestino).  Siente dolor o tiene inflamacin fuera de la zona de las hemorroides. ASEGRESE DE QUE:   Comprende estas instrucciones.  Controlar su enfermedad.  Solicitar ayuda de inmediato si no mejora o si empeora.   Esta informacin no tiene Marine scientist el consejo del mdico. Asegrese de hacerle al mdico cualquier pregunta que tenga.   Document Released: 09/04/2012 Elsevier Interactive Patient Education 2016 [place] Tdap (contra la difteria, el ttanos y [place]): Lo que debe saber (Tdap Vaccine [Tetanus, Diphtheria, and Pertussis]: What You Need to Know) 1. Por qu vacunarse? El ttanos, la difteria y la tosferina son enfermedades muy graves. La vacuna Tdap nos puede proteger de estas enfermedades. Adems, la vacuna Tdap que se aplica a las  Chemical engineer a los bebs recin nacidos contra la tosferina. En la actualidad, el [place] (trismo) es una enfermedad poco frecuente en los [place]. Provoca la contraccin y el endurecimiento dolorosos de los msculos, por lo general, de todo el cuerpo.  Puede causar el endurecimiento de los msculos de la cabeza y el cuello, de modo que impide abrir la boca, tragar y en algunos casos, Ambulance person. El ttanos causa la muerte de aproximadamente 1de cada 10personas que contraen la infeccin, incluso despus de que reciben la mejor atencin mdica. La DIFTERIA tambin es poco frecuente en los [place] [employer]ney Bowes. Puede causar la formacin de una membrana gruesa en la parte posterior de la garganta.  Esto tiene como consecuencia problemas respiratorios, insuficiencia cardaca, parlisis y [place]. La TOSFERINA (tos convulsa) provoca episodios de tos intensa que pueden dificultar la respiracin y provocar vmitos y trastornos del sueo.  Tambin puede causar prdida de peso, incontinencia y fractura de [place]. Dos de cada 100 adolescentes y 5 de cada 100 adultos con tosferina deben ser hospitalizados o tienen complicaciones, que podran incluir neumona y [place]. Estas enfermedades son provocadas por bacterias. La difteria y la tosferina se contagian de Ardelia Mems persona a otra a travs de las secreciones de la tos o el estornudo. El ttanos ingresa al organismo a travs de cortes,  rasguos o heridas. Antes de las vacunas, en los [place] se informaban 200000 casos de difteria, 200000 casos de tosferina y cientos de casos de ttanos cada ao. Desde el inicio de la vacunacin, los informes de casos de ttanos y difteria han disminuido alrededor del 99%, y de tosferina, alrededor del 80%. 2. Edward Jolly Tdap La vacuna Tdap protege a adolescentes y adultos contra el ttanos, la difteria y la tosferina. Una dosis de Tdap se administra a los 71 o 12 aos. Las [place] [employer] no recibieron  la vacuna Tdap a esa edad deben recibirla tan pronto como sea posible. Es muy importante que los mdicos y todos aquellos que tengan contacto cercano con bebs menores de 89meses reciban la vacuna Tdap. Las mujeres deben recibir una dosis de Tdap en cada Media planner, para proteger al recin nacido de la tosferina. Los nios tienen mayor riesgo de complicaciones graves y potencialmente mortales debido a la tosferina. Otra vacuna llamada Td protege contra el ttanos y la difteria, pero no contra la tosferina. Todos deben recibir una dosis de refuerzo de Td cada 10 aos. La Tdap puede aplicarse como uno de estos refuerzos si nunca antes recibi esta vacuna. Tambin se puede aplicar despus de un corte o quemadura grave para prevenir la infeccin por ttanos. El mdico o la persona que le aplique la vacuna puede darle ms informacin al Sears Holdings Corporation. La Tdap puede administrarse de manera segura simultneamente con otras vacunas. 3. Algunas personas no deben recibir la [employer] persona que alguna vez tuvo una reaccin alrgica potencialmente mortal a Ardelia Mems dosis previa de cualquier vacuna contra el ttanos, la difteria o la tosferina, O que tenga una alergia grave a cualquiera de los componentes de esta vacuna, no debe recibir la vacuna Tdap. Informe a la persona que le aplica la vacuna si tiene cualquier alergia grave.  Una persona que estuvo en estado de coma o sufri mltiples convulsiones en el trmino de los 7das despus de recibir una dosis de DTP o DTaP, o una dosis previa de Tdap, no debe recibir la vacuna Tdap, salvo que se haya encontrado otra causa que no fuera la vacuna. An puede recibir la Td.  Consulte con su mdico si:  tiene convulsiones u otro problema del sistema nervioso,  tuvo hinchazn o dolor intenso despus de cualquier vacuna contra la difteria o el ttanos,  alguna vez ha sufrido el sndrome de [place],  no se siente Pharmacologist en que se ha programado la vacuna. 4.  Riesgos Con cualquier medicamento, incluyendo las vacunas, existe la posibilidad de que aparezcan efectos secundarios. Suelen ser leves y desaparecen por s solos. Tambin son posibles las reacciones graves, pero en raras ocasiones. La State Farm de las personas a las que se les aplica la vacuna Tdap no tienen ningn problema. Problemas leves despus de la vacuna Tdap (No interfirieron en otras actividades)  Dolor en el lugar donde se aplic la vacuna (alrededor de 3 de cada 4 adolescentes o 2 de cada 3 adultos).  Enrojecimiento o hinchazn en el lugar donde se aplic la vacuna (1 de cada 5 personas).  Fiebre leve de al menos 100,72F (38C) (hasta alrededor de 1 cada 25 adolescentes o 1 de cada 100 adultos).  Dolor de cabeza (alrededor de 3 o 4 de cada 10 personas).  Cansancio (alrededor de 1 de cada 3 o 4 personas).  Nuseas, vmitos, diarrea, dolor de estmago (hasta 1 de cada 4 adolescentes o 1 de cada 10 adultos).  Escalofros, dolores articulares (  alrededor de 1de cada 10personas).  Dolores corporales (alrededor de 1de cada 3 o 4personas).  Erupcin cutnea, inflamacin de los ganglios (poco frecuente). Problemas moderados despus de recibir la vacuna Tdap (Interfirieron en otras actividades, pero no requirieron atencin mdica)  Management consultant donde se aplic la vacuna (hasta 1de cada 5 o 6).  Enrojecimiento o inflamacin en el lugar donde se aplic la vacuna (hasta alrededor de 1 de cada 16adolescentes o 1 de cada 12adultos).  Fiebre de ms de 102F (38,8C) (alrededor de 1 de cada 100 adolescentes o 1 de cada 250 adultos).  Dolor de cabeza (alrededor de 1de cada 7adolescentes o 1de cada 10adultos).  Nuseas, vmitos, diarrea, dolor de estmago (hasta 1 o 3 de cada 100 personas).  Hinchazn de todo el brazo en el que se aplic la vacuna (hasta alrededor de 1de cada 500personas). Problemas graves despus de la vacuna Tdap (Impidieron Optometrist las  actividades habituales; requirieron atencin mdica)  Inflamacin, dolor intenso, sangrado y enrojecimiento en el brazo en que se aplic la vacuna (poco frecuente). Problemas que podran ocurrir despus de cualquier vacuna:  Las personas a veces se desmayan despus de un procedimiento mdico, incluida la vacunacin. Si permanece sentado o recostado durante 15 minutos puede ayudar a Merrill Lynch y las lesiones causadas por las cadas. Informe al mdico si se siente mareado, tiene cambios en la visin o zumbidos en los odos.  Algunas personas sienten un dolor intenso en el hombro y tienen dificultad para mover el brazo donde se coloc la vacuna. Esto sucede con muy poca frecuencia.  Cualquier medicamento puede causar una reaccin alrgica grave. Dichas reacciones son Orlene Erm poco frecuentes con una vacuna (se calcula que menos de 1en un milln de dosis) y se producen de unos minutos a unas horas despus de Writer. Al igual que con cualquier Hall[employer]urton Company, existe una probabilidad muy remota de que una vacuna cause una lesin grave o la [place]. Se controla permanentemente la seguridad de las vacunas. Para obtener ms informacin, visite: http://www.aguilar.org/. 5. Qu pasa si hay un problema grave? A qu signos debo estar atento?  Observe todo lo que le preocupe, como signos de una reaccin alrgica grave, fiebre muy alta o comportamiento fuera de lo normal.  Los signos de una reaccin alrgica grave pueden incluir ronchas, hinchazn de la cara y la garganta, dificultad para respirar, latidos cardacos acelerados, mareos y debilidad. Generalmente, estos comenzaran entre unos pocos minutos y algunas horas despus de la vacunacin. Qu debo hacer?  Si usted piensa que se trata de una reaccin alrgica grave o de otra emergencia que no puede esperar, llame al 911 o lleve a la persona al hospital ms cercano. Sino, llame a su mdico.  Despus, la reaccin debe informarse al  Sistema de Informacin sobre Efectos Adversos de las [place] (Vaccine Adverse Event Reporting System, VAERS). Su mdico puede presentar este informe, o puede hacerlo usted mismo a travs del sitio web de VAERS, en [email or web address], o llamando al [phone]. VAERS no brinda recomendaciones mdicas. 6. [place] Compensacin de Daos por Commerce de Compensacin de Daos por Clinical biochemist (National Vaccine Injury Compensation Program, VICP) es un programa federal que fue creado para Patent examiner a las personas que puedan haber sufrido daos al recibir ciertas vacunas. Aquellas personas que consideren que han sufrido un dao como consecuencia de una vacuna y [place] saber ms acerca del programa y de cmo presentar [place], [place] llamar al [phone] o visitar su sitio web en  [email or web address]. Hay un lmite de tiempo para presentar un reclamo de compensacin. 7. Cmo puedo obtener ms informacin?  Consulte a su mdico. Este puede darle el prospecto de la vacuna o recomendarle otras fuentes de informacin.  Comunquese con el servicio de salud de su localidad o su estado.  Comunquese con los Centros para Building surveyor y la Prevencin de Probation officer for Disease Control and Prevention , CDC).  Llame al [phone] (1-800-CDC-INFO), o  visite el sitio web [employer] en http://hunter.com/. Declaracin de informacin sobre la vacuna contra la difteria, el ttanos y la tosferina (Tdap) de los CDC (24/02/15)   Esta informacin no tiene Marine scientist el consejo del mdico. Asegrese de hacerle al mdico cualquier pregunta que tenga.   Document Released: 04/26/2012 Document Revised: 05/31/2014 Elsevier Interactive Patient Education 2016 [employer]. Influenza Virus Vaccine injection (Fluarix) Qu es este medicamento? La VACUNA ANTIGRIPAL ayuda a disminuir el riesgo de contraer la influenza, tambin conocida como la gripe. La  vacuna solo ayuda a protegerle contra algunas cepas de influenza. Esta vacuna no ayuda a reducir Catering manager de contraer influenza pandmica [id number]. Este medicamento puede ser utilizado para otros usos; si tiene alguna pregunta consulte con su proveedor de atencin mdica o con su farmacutico. Qu le debo informar a mi profesional de la salud antes de tomar este medicamento? Necesita saber si usted presenta alguno de los siguientes problemas o situaciones: -trastorno de sangrado como hemofilia -fiebre o infeccin -sndrome de Guillain-Barre u otros problemas neurolgicos -problemas del sistema inmunolgico -infeccin por el virus de la inmunodeficiencia humana (VIH) o SIDA -niveles bajos de plaquetas en la sangre -esclerosis mltiple -una Risk analyst o inusual a las vacunas antigripales, a los huevos, protenas de pollo, al ltex, a la gentamicina, a otros medicamentos, alimentos, colorantes o conservantes -si est embarazada o buscando quedar embarazada -si est amamantando a un beb Cmo debo utilizar este medicamento? Esta vacuna se administra mediante inyeccin por va intramuscular. Lo administra un profesional de [employer]	[place]. Recibir una copia de informacin escrita sobre la vacuna antes de cada vacuna. Asegrese de leer este folleto cada vez cuidadosamente. Este folleto puede cambiar con frecuencia. Hable con su pediatra para informarse acerca del uso de este medicamento en nios. Puede requerir atencin especial. Sobredosis: Pngase en contacto inmediatamente con un centro toxicolgico o una sala de urgencia si usted cree que haya tomado demasiado medicamento. ATENCIN: [employer] es solo para usted. No comparta este medicamento con nadie. Qu sucede si me olvido de una dosis? No se aplica en este caso. Qu puede interactuar con este medicamento? -quimioterapia o radioterapia -medicamentos que suprimen el sistema inmunolgico, tales como etanercept, anakinra, infliximab y  adalimumab -medicamentos que tratan o previenen cogulos sanguneos, como warfarina -fenitona -medicamentos esteroideos, como la prednisona o la cortisona -teofilina -vacunas Puede ser que esta lista no menciona todas las posibles interacciones. Informe a su profesional de [employer]	[place] de [employer] productos a base de hierbas, medicamentos de [place] o suplementos nutritivos que est tomando. Si usted fuma, consume bebidas alcohlicas o si utiliza drogas ilegales, indqueselo tambin a su profesional de [employer]	[place]. Algunas sustancias pueden interactuar con su medicamento. A qu debo estar atento al usar [employer]? Informe a su mdico o a Barrister's clerk de la [employer] todos los efectos secundarios que persistan despus de 3 das. Llame a su proveedor de atencin mdica si se presentan sntomas inusuales dentro de las 6 semanas posteriores a la vacunacin. Es posible que todava pueda  contraer la gripe, pero la enfermedad no ser tan fuerte como normalmente. No puede contraer la gripe de esta vacuna. La vacuna antigripal no le protege contra resfros u otras enfermedades que pueden causar [place]. Debe vacunarse cada ao. Qu efectos secundarios puedo tener al [employer] este medicamento? Efectos secundarios que debe informar a su mdico o a Barrister's clerk de la salud tan pronto como sea posible: -reacciones alrgicas como erupcin cutnea, picazn o urticarias, hinchazn de la cara, labios o lengua Efectos secundarios que, por lo general, no requieren atencin mdica (debe informarlos a su mdico o a su profesional de la salud si persisten o si son molestos): -fiebre -dolor de cabeza -molestias y dolores musculares -dolor, sensibilidad, enrojecimiento o Estate agent de la inyeccin -cansancio o debilidad Puede ser que esta lista no menciona todos los posibles efectos secundarios. Comunquese a su mdico por asesoramiento mdico [employer]. Usted puede informar  los efectos secundarios a la FDA por telfono al 1-800-FDA-1088. Dnde debo guardar mi medicina? Esta vacuna se administra solamente en clnicas, farmacias, consultorio mdico u otro consultorio de un profesional de la salud y no Sports coach en su domicilio. ATENCIN: Este folleto es un resumen. Puede ser que no cubra toda la posible informacin. Si usted tiene preguntas acerca de esta medicina, consulte con su mdico, su farmacutico o su profesional de Technical sales engineer.    2016, Elsevier/Gold Standard. (2009-11-11 15:31:40)

## 2016-02-03 NOTE — Progress Notes (Signed)
Crystal [name], is a 45 y.o. female  [id number]  [id number]  DOB - 1970-12-03  Chief Complaint  Patient presents with  . Establish Care        Subjective:   Crystal [name] is a 45 y.o. female here today for a follow up visit.  W/ hx of robotic-assisted type III radical laparoscopic hysterectomy with bilateral salpingectomy and bilateral sentinel lymph node biopsy on 07/01/15 for clinical stage IB 1 cevical SCC.  Pt overall doing well, but c/o of intermittent constipation still, although she is trying to eat more fiber.  Also c/o of burning sensation in MEG region after certain foods.  C/o of residual pain on left breast where left lumpectomy was done. Also c/o of mild intermittent abd discomfort/irritation from sight of laps surgery.  Has hemorrhoids, but does not bother her. She thinks it is causing her to go to bath room more w/ no bms.    C.o of intermittent bleeding in mouth as well last few months. Pt recently saw dentist, numerous cavities, and will need to come back to dentist 9/22 for further cleanings/treatments.  Patient has No headache, No chest pain, No abdominal pain - No Nausea, No new weakness tingling or numbness, No Cough - SOB.  Interpreter was used to communicate directly with patient for the entire encounter including providing detailed patient instructions.   No problems updated.  ALLERGIES: No Known Allergies  PAST MEDICAL HISTORY: Past Medical History:  Diagnosis Date  . Cancer ([place])    cervical cancer dx.   . History of kidney stones    x1   . Kidney stones    cyst on kidney -no problems  . 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 PONV (postoperative nausea and vomiting)     MEDICATIONS AT HOME: Prior to Admission medications   Medication Sig Start Date End Date Taking? Authorizing Provider  acetaminophen (TYLENOL) 325 MG tablet Take 650 mg by mouth every 6 (six) hours as needed. Reported on 11/03/2015     Historical Provider, MD  b complex vitamins capsule Take 1 capsule by mouth daily.    Historical Provider, MD  famotidine (PEPCID) 20 MG tablet Take 1 tablet (20 mg total) by mouth 2 (two) times daily. 02/03/16 02/02/17  Maren Reamer, MD  Methylcellulose, Laxative, (CITRUCEL) 500 MG TABS Take by mouth.    Historical Provider, MD  senna (SENOKOT) 8.6 MG TABS tablet Take 1 tablet (8.6 mg total) by mouth daily as needed for mild constipation. 02/03/16   Maren Reamer, MD     Objective:   Vitals:   02/03/16 1424  BP: 103/68  Pulse: 78  Resp: 16  Temp: 97.9 F (36.6 C)  TempSrc: Oral  SpO2: 97%  Weight: 111 lb 3.2 oz (50.4 kg)    Exam General appearance : Awake, alert, not in any distress. Speech Clear. Not toxic looking, pleasant HEENT: Atraumatic and Normocephalic, pupils equally reactive to light.  Cavities. Neck: supple, no JVD.   Chest:Good air entry bilaterally, no added sounds. CVS: S1 S2 regular, no murmurs/gallups or rubs. Abdomen: Bowel sounds active, soft, Non tender and not distended with no gaurding, rigidity or rebound.  Post op sights from laps surgery well healing.  No hsm.  Extremities: B/L Lower Ext shows no edema, both legs are warm to touch Neurology: Awake alert, and oriented X 3, CN II-XII grossly intact, Non focal Skin:No Rash  Data Review Lab Results  Component Value Date   HGBA1C 5.5  04/04/2015    Depression screen PHQ 2/9 11/03/2015 08/25/2015 08/07/2015 02/28/2015  Decreased Interest 0 0 0 0  Down, Depressed, Hopeless 0 0 0 0  PHQ - 2 Score 0 0 0 0      Assessment & Plan   1. Squamous cell carcinoma of cervix (HCC) Sp robotic-assisted type III radical laparoscopic hysterectomy with bilateral salpingectomy and bilateral sentinel lymph node biopsy on 07/01/15.  - gyn onc Dr Denman George,  Pt saw yesterday, recd yearly pap surveillance w/ hpv testing (due next March 2018) - f/u Dr Harolyn Rutherford 3 months, Dr Denman George 24months.  2. Constipation, unspecified  constipation type Recd increase her fiber intake. - probiotics recd as well - prn senna  3. Flu vaccine need - Flu Vaccine QUAD 36+ mos PF IM (Fluarix & Fluzone Quad PF)  4. Gastroesophageal reflux disease without esophagitis Recd gerd diet. pepcid 20bid ordered for now.  5. Hemorrhoids - recd high fiber diet, pt denies any c/o of burning/pruritis to need rx at this time.  6. Bleeding gums - suspect gintilvitis from tooth decay, seeing dentist, has f/u this month. - labs 6/17 w/ no abnmls in cbc to indicate bleeding risk.   Patient have been counseled extensively about nutrition and exercise  Return in about 3 months (around 05/04/2016).  The patient was given clear instructions to go to ER or return to medical center if symptoms don't improve, worsen or new problems develop. The patient verbalized understanding. The patient was told to call to get lab results if they haven't heard anything in the next week.   This note has been created with Surveyor, quantity. Any transcriptional errors are unintentional.   Maren Reamer, MD, [place] and [facility] [place], [place]   02/03/2016, 3:11 PM

## 2016-02-26 ENCOUNTER — Other Ambulatory Visit: Payer: Self-pay | Admitting: Pediatrics

## 2016-02-26 MED ORDER — PERMETHRIN 5 % EX CREA: 1.0000 "application " | TOPICAL_CREAM | Freq: Once | CUTANEOUS | 0 refills | Status: AC

## 2016-02-26 MED FILL — PERMETHRIN 5% CREAM: 5 | 20 days supply | Qty: 60 | Fill #0

## 2016-02-26 NOTE — Progress Notes (Signed)
Treating household contacts of scabies.  Crystal Cowper, MD

## 2016-03-09 ENCOUNTER — Ambulatory Visit: Payer: Self-pay | Attending: Internal Medicine | Admitting: Physician Assistant

## 2016-03-09 ENCOUNTER — Encounter: Payer: Self-pay | Admitting: Physician Assistant

## 2016-03-09 VITALS — BP 97/63 | HR 73 | Temp 97.5°F | Resp 16 | Wt 110.8 lb

## 2016-03-09 DIAGNOSIS — Z79899 Other long term (current) drug therapy: Secondary | ICD-10-CM | POA: Insufficient documentation

## 2016-03-09 DIAGNOSIS — B029 Zoster without complications: Secondary | ICD-10-CM | POA: Insufficient documentation

## 2016-03-09 MED ORDER — VALACYCLOVIR HCL 1 G PO TABS: 1000.0000 mg | ORAL_TABLET | Freq: Three times a day (TID) | ORAL | 0 refills | Status: DC

## 2016-03-09 MED ORDER — NAPROXEN 500 MG PO TABS: 500.0000 mg | ORAL_TABLET | Freq: Two times a day (BID) | ORAL | 0 refills | Status: DC

## 2016-03-09 MED ORDER — METHOCARBAMOL 500 MG PO TABS: 500.0000 mg | ORAL_TABLET | Freq: Four times a day (QID) | ORAL | 0 refills | Status: DC

## 2016-03-09 MED ORDER — TRAMADOL HCL 50 MG PO TABS: 50.0000 mg | ORAL_TABLET | Freq: Three times a day (TID) | ORAL | 0 refills | Status: DC | PRN

## 2016-03-09 MED ORDER — ONDANSETRON HCL 8 MG PO TABS: 8.0000 mg | ORAL_TABLET | Freq: Three times a day (TID) | ORAL | 0 refills | Status: DC | PRN

## 2016-03-09 MED FILL — NAPROXEN 500 MG TABLET: 500 | 30 days supply | Qty: 60 | Fill #0

## 2016-03-09 MED FILL — ONDANSETRON HCL 8 MG TABLET: 8 | 7 days supply | Qty: 20 | Fill #0

## 2016-03-09 MED FILL — ?VALACYCLOVIR HCL 1 GRAM TA: 1 | 7 days supply | Qty: 21 | Fill #0

## 2016-03-09 MED FILL — METHOCARBAMOL 500 MG TABLET: 500 | 23 days supply | Qty: 90 | Fill #0

## 2016-03-09 NOTE — Progress Notes (Signed)
Pt is in the office today for pain under right breast Pt states she is not having pain Pt states she has the pain at night and it radiates Pt states it has been going on for 3 nights Pt states she is only concern because she is a cancer pt Pt states when she coughs she has chest pain but its only when she coughs Pt states she has a sore throat Pt states she has been feeling weak lately

## 2016-03-09 NOTE — Progress Notes (Signed)
Crystal [name], is a 45 y.o. female  [id number]  [id number]  DOB - 1970/09/09  Subjective:  Chief Complaint and HPI: Koleen Gless is a 45 y.o. female here today for pain under her R breast and R chest for the last 3 nights.  She describes the pain as burning and tingling.  She also c/o general fatigue, nausea without vomiting, Sore throat, and congestion for the last couple of days.  She has had recent MMG and MRI of the breast which showed no malignant process.  She denies SOB.    Reviewed  MMG/MRI, last 2 Office visits ROS:   Constitutional:  No f/c, No night sweats, No unexplained weight loss. EENT:  No vision changes, No blurry vision, No hearing changes. + mild ST and congestion  Respiratory: No cough, No SOB Cardiac: +CP, no palpitations GI:  No abd pain, No V/D.  +nausea.   GU: No Urinary s/sx Musculoskeletal: No joint pain Neuro: No headache, no dizziness, no motor weakness.  Skin: No rash Endocrine:  No polydipsia. No polyuria.  Psych: Denies SI/HI  No problems updated.  ALLERGIES: No Known Allergies  PAST MEDICAL HISTORY: Past Medical History:  Diagnosis Date  . Cancer ([place])    cervical cancer dx.   . History of kidney stones    x1   . Kidney stones    cyst on kidney -no problems  . 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 PONV (postoperative nausea and vomiting)     MEDICATIONS AT HOME: Prior to Admission medications   Medication Sig Start Date End Date Taking? Authorizing Provider  senna (SENOKOT) 8.6 MG TABS tablet Take 1 tablet (8.6 mg total) by mouth daily as needed for mild constipation. 02/03/16  Yes Maren Reamer, MD  acetaminophen (TYLENOL) 325 MG tablet Take 650 mg by mouth every 6 (six) hours as needed. Reported on 11/03/2015    Historical Provider, MD  famotidine (PEPCID) 20 MG tablet Take 1 tablet (20 mg total) by mouth 2 (two) times daily. Patient not taking: Reported on 03/09/2016 02/03/16 02/02/17  Maren Reamer, MD  methocarbamol (ROBAXIN) 500 MG tablet Take 1 tablet (500 mg total) by mouth 4 (four) times daily. X 10 days then prn pain 03/09/16   Argentina Donovan, PA-C  naproxen (NAPROSYN) 500 MG tablet Take 1 tablet (500 mg total) by mouth 2 (two) times daily with a meal. X 10 days then prn pain 03/09/16   Argentina Donovan, PA-C  ondansetron (ZOFRAN) 8 MG tablet Take 1 tablet (8 mg total) by mouth every 8 (eight) hours as needed for nausea or vomiting. 03/09/16   Argentina Donovan, PA-C  traMADol (ULTRAM) 50 MG tablet Take 1 tablet (50 mg total) by mouth every 8 (eight) hours as needed. 03/09/16   Argentina Donovan, PA-C  valACYclovir (VALTREX) 1000 MG tablet Take 1 tablet (1,000 mg total) by mouth 3 (three) times daily. For shingles 03/09/16   Argentina Donovan, PA-C     Objective:  EXAM:   Vitals:   03/09/16 1118  BP: 97/63  Pulse: 73  Resp: 16  Temp: 97.5 F (36.4 C)  TempSrc: Oral  SpO2: 97%  Weight: 110 lb 12.8 oz (50.3 kg)    General appearance : A&OX3. NAD. Non-toxic-appearing HEENT: Atraumatic and Normocephalic.  PERRLA. EOM intact.  TM clear B. Mouth-MMM, post pharynx WNL w/o erythema, No PND. Neck: supple, no JVD. No cervical lymphadenopathy. No thyromegaly Chest/Lungs:  Breathing-non-labored, Good air entry  bilaterally, breath sounds normal without rales, rhonchi, or wheezing. There is hyperesthesia and pain along the T5 dermatome on the R side of the chest and into the back.  There is minimal erythema without vesicular lesions mid axillary line T5 dermatome CVS: S1 S2 regular, no murmurs, gallops, rubs  Abdomen: Bowel sounds present, Non tender and not distended with no gaurding, rigidity or rebound. Extremities: Bilateral Lower Ext shows no edema, both legs are warm to touch with = pulse throughout Neurology:  CN II-XII grossly intact, Non focal.   Psych:  TP linear. J/I WNL. Normal speech. Appropriate eye contact and affect.  Skin:  No Rash  Data Review Lab Results   Component Value Date   HGBA1C 5.5 04/04/2015     Assessment & Plan   1. Herpes zoster without complication-T5 dermatome This appears to be an early presentation of shingles without rash at this point - valACYclovir (VALTREX) 1000 MG tablet; Take 1 tablet (1,000 mg total) by mouth 3 (three) times daily. For shingles  Dispense: 21 tablet; Refill: 0 - naproxen (NAPROSYN) 500 MG tablet; Take 1 tablet (500 mg total) by mouth 2 (two) times daily with a meal. X 10 days then prn pain  Dispense: 60 tablet; Refill: 0 - methocarbamol (ROBAXIN) 500 MG tablet; Take 1 tablet (500 mg total) by mouth 4 (four) times daily. X 10 days then prn pain  Dispense: 90 tablet; Refill: 0 - traMADol (ULTRAM) 50 MG tablet; Take 1 tablet (50 mg total) by mouth every 8 (eight) hours as needed.  Dispense: 30 tablet; Refill: 0 - ondansetron (ZOFRAN) 8 MG tablet; Take 1 tablet (8 mg total) by mouth every 8 (eight) hours as needed for nausea or vomiting.  Dispense: 20 tablet; Refill: 0     Patient have been counseled extensively about nutrition and exercise  Return in about 3 weeks (around 03/30/2016) for f/up for shingles with me or Dr Janne Napoleon.  The patient was given clear instructions to go to ER or return to medical center if symptoms don't improve, worsen or new problems develop. The patient verbalized understanding. The patient was told to call to get lab results if they haven't heard anything in the next week.     Freeman Caldron, PA-C [facility] and [place], [place]   03/09/2016, 12:29 PMPatient ID: Alexanne Zoll Ramirez-Flores, female   DOB: 1970/07/05, 45 y.o.   MRN: [id number]

## 2016-03-09 NOTE — Patient Instructions (Signed)
Neuralgia postherptica (Postherpetic Neuralgia) La neuralgia postherptica (NPH) es un dolor neural que aparece despus de tener culebrilla. La culebrilla es una erupcin cutnea dolorosa que aparece en un lado del cuerpo, generalmente en el tronco o el rostro. Es causada por el virus de la varicela zoster, el mismo virus que causa la varicela. En las personas que tuvieron varicela, el virus puede reaparecer aos ms tarde y causar Counsellor. Puede tener neuralgia postherptica si sigue teniendo dolor durante 86meses despus de la desaparicin de la erupcin de la culebrilla. La neuralgia postherptica aparece en la misma zona donde se produjo la erupcin cutnea de la [place], y, en la [place], desaparece en el trmino de 1ao.  La aplicacin de la vacuna contra la culebrilla puede prevenir la neuralgia postherptica. Se recomienda que las [employer] de 50aos se apliquen esta vacuna, la cual puede prevenir la [place] y, [place], reducir el riesgo de neuralgia postherptica si tiene Counsellor. CAUSAS La neuralgia postherptica se debe al dao neural que causa el virus de la varicela zoster. Este dao hipersensibiliza los nervios.  [place] envejecimiento es el principal factor de riesgo de desarrollar neuralgia postherptica. La [employer] de las personas que padecen este trastorno son [place]. Otros factores de riesgo son:  Scientist, clinical (histocompatibility and immunogenetics) muy intenso antes de que aparezca la erupcin cutnea de la culebrilla.  Tener una erupcin cutnea muy extensa.  Tener culebrilla en el nervio que inerva el rostro y el ojo (nervio trigmino). SIGNOS Y SNTOMAS El dolor es el sntoma principal de neuralgia postherptica. Suele ser [employer] intenso y puede describirse como un dolor punzante, que provoca sensacin de quemazn o que se parece a Social worker. El dolor puede ser intermitente o [place]. Los roces [employer] piel o los cambios de  temperatura pueden Psychologist, prison and probation services. Junto con Conservation officer, historic buildings, puede tener picazn. DIAGNSTICO  El mdico puede diagnosticar la neuralgia postherptica en funcin de los sntomas y de los antecedentes de [place]. Generalmente, no es Biochemist, clinical de laboratorio ni otras pruebas diagnsticas. TRATAMIENTO  No hay cura para la neuralgia postherptica. El tratamiento se centrar en el alivio del dolor. Generalmente, los analgsicos de venta libre no alivian el dolor que provoca la neuralgia postherptica. Tal vez deba consultar a Research officer, trade union. El tratamiento puede incluir:  Antidepresivos para ayudar con Conservation officer, historic buildings y Garment/textile technologist sueo.  Anticonvulsivos para Probation officer.  Analgsicos fuertes (opioides).  Un parche anestsico que se coloca sobre la piel (parche de lidocana). INSTRUCCIONES PARA EL CUIDADO EN EL HOGAR Recuperarse de la neuralgia postherptica puede llevar mucho tiempo. Trabaje en estrecha colaboracin con el mdico y procure tener un buen sistema de apoyo en su casa.   Tome todos los [employer] se lo haya indicado el mdico.  Use ropa cmoda y suelta.  Reunion las zonas sensibles con una venda para reducir la friccin de las prendas contra la zona.  Si el fro no [employer], intente aplicarse una compresa fra o una bolsa con gel para bajar la temperatura en la zona.  Hable con el mdico si est deprimido o desesperado. Convivir con un dolor crnico puede causar depresin. SOLICITE ATENCIN MDICA SI:  El medicamento no Production designer, theatre/television/film.  Tiene dificultades para Financial controller en su casa.   Esta informacin no tiene Marine scientist el consejo del mdico. Asegrese de hacerle al mdico cualquier pregunta que tenga.   Document Released: 08/26/2008  Document Revised: 05/31/2014 Elsevier Interactive Patient Education [employer].

## 2016-04-07 ENCOUNTER — Ambulatory Visit: Payer: No Typology Code available for payment source | Attending: Internal Medicine | Admitting: Internal Medicine

## 2016-04-07 ENCOUNTER — Encounter: Payer: Self-pay | Admitting: Internal Medicine

## 2016-04-07 VITALS — BP 106/46 | HR 68 | Temp 97.9°F | Resp 18 | Wt 112.2 lb

## 2016-04-07 DIAGNOSIS — B029 Zoster without complications: Secondary | ICD-10-CM

## 2016-04-07 DIAGNOSIS — Z8489 Family history of other specified conditions: Secondary | ICD-10-CM | POA: Insufficient documentation

## 2016-04-07 DIAGNOSIS — K068 Other specified disorders of gingiva and edentulous alveolar ridge: Secondary | ICD-10-CM

## 2016-04-07 DIAGNOSIS — Z8541 Personal history of malignant neoplasm of cervix uteri: Secondary | ICD-10-CM | POA: Insufficient documentation

## 2016-04-07 DIAGNOSIS — M542 Cervicalgia: Secondary | ICD-10-CM

## 2016-04-07 DIAGNOSIS — R51 Headache: Secondary | ICD-10-CM | POA: Insufficient documentation

## 2016-04-07 DIAGNOSIS — F329 Major depressive disorder, single episode, unspecified: Secondary | ICD-10-CM

## 2016-04-07 DIAGNOSIS — R42 Dizziness and giddiness: Secondary | ICD-10-CM

## 2016-04-07 DIAGNOSIS — G44209 Tension-type headache, unspecified, not intractable: Secondary | ICD-10-CM

## 2016-04-07 DIAGNOSIS — F32A Depression, unspecified: Secondary | ICD-10-CM

## 2016-04-07 LAB — CBC WITH DIFFERENTIAL/PLATELET
BASOS ABS: 0 {cells}/uL (ref 0–200)
BASOS PCT: 0 %
EOS ABS: 243 {cells}/uL (ref 15–500)
Eosinophils Relative: 3 %
HEMATOCRIT: 45.2 % — AB (ref 35.0–45.0)
HEMOGLOBIN: 15.3 g/dL (ref 11.7–15.5)
Lymphocytes Relative: 24 %
Lymphs Abs: 1944 cells/uL (ref 850–3900)
MCH: 32.6 pg (ref 27.0–33.0)
MCHC: 33.8 g/dL (ref 32.0–36.0)
MCV: 96.4 fL (ref 80.0–100.0)
MONO ABS: 486 {cells}/uL (ref 200–950)
MPV: 10.7 fL (ref 7.5–12.5)
Monocytes Relative: 6 %
NEUTROS ABS: 5427 {cells}/uL (ref 1500–7800)
Neutrophils Relative %: 67 %
Platelets: 271 10*3/uL (ref 140–400)
RBC: 4.69 MIL/uL (ref 3.80–5.10)
RDW: 13.2 % (ref 11.0–15.0)
WBC: 8.1 10*3/uL (ref 3.8–10.8)

## 2016-04-07 LAB — BASIC METABOLIC PANEL WITH GFR
BUN: 11 mg/dL (ref 7–25)
CHLORIDE: 104 mmol/L (ref 98–110)
CO2: 24 mmol/L (ref 20–31)
Calcium: 9.6 mg/dL (ref 8.6–10.2)
Creat: 0.76 mg/dL (ref 0.50–1.10)
GFR, Est African American: >89 mL/min (ref >=60)
GFR, Est Non African American: >89 mL/min (ref >=60)
GLUCOSE: 78 mg/dL (ref 65–99)
POTASSIUM: 4.4 mmol/L (ref 3.5–5.3)
Sodium: 136 mmol/L (ref 135–146)

## 2016-04-07 MED ORDER — DICLOFENAC SODIUM 1 % TD GEL: 2.0000 g | Freq: Four times a day (QID) | TRANSDERMAL | 2 refills | Status: DC

## 2016-04-07 MED ORDER — METHOCARBAMOL 500 MG PO TABS: 500.0000 mg | ORAL_TABLET | Freq: Four times a day (QID) | ORAL | 0 refills | Status: DC

## 2016-04-07 MED ORDER — MELOXICAM 15 MG PO TABS: 15.0000 mg | ORAL_TABLET | Freq: Every day | ORAL | 0 refills | Status: DC

## 2016-04-07 MED ORDER — AMITRIPTYLINE HCL 25 MG PO TABS: 25.0000 mg | ORAL_TABLET | Freq: Every day | ORAL | 3 refills | Status: DC

## 2016-04-07 MED FILL — AMITRIPTYLINE HCL 25 MG TAB: 25 | 30 days supply | Qty: 30 | Fill #0

## 2016-04-07 MED FILL — METHOCARBAMOL 500 MG TABLET: 500 | 30 days supply | Qty: 90 | Fill #0

## 2016-04-07 MED FILL — VOLTAREN 1% GEL: 1 | 12 days supply | Qty: 100 | Fill #0

## 2016-04-07 MED FILL — MELOXICAM 15 MG TABLET: 15 | 30 days supply | Qty: 30 | Fill #0

## 2016-04-07 NOTE — Progress Notes (Addendum)
Crystal [name], is a 45 y.o. female  [id number]  [id number]  DOB - 23-Jun-1970  No chief complaint on file.       Subjective:   Crystal [name] is a 45 y.o. female here today for a follow up visit.  Recent dx Shingles 10/17 per notes, pt states that is all resolved.  She notes that she has been having gum bleeding issues. She has been seeing the dentist, about 3 times recently with a lot of treatments and cleaning.  She notes when she brush her teeth she has some bleeding and sometimes at night she has to spit some blood out of her mouth. Denies current toothaches. No f/c, but more fatigue lately.  Also c./o of neck pains and frontal headaches behind her eyes for thea last few weeks. No visual changes/ photophobia/n/v/f/  Tylenol did not help.  She is taking care of her sister (who is also a pt of mine, who  Has a lot of psychiatric problems). - stressed   Patient has  No chest pain, No abdominal pain - No Nausea, No new weakness tingling or numbness, No Cough - SOB.  + transient dizziness at times, but tired, no loc.   Interpreter was used to communicate directly with patient for the entire encounter including providing detailed patient instructions.  No problems updated.  ALLERGIES: No Known Allergies  PAST MEDICAL HISTORY: Past Medical History:  Diagnosis Date  . Cancer ([place])    cervical cancer dx.   . History of kidney stones    x1   . Kidney stones    cyst on kidney -no problems  . 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 PONV (postoperative nausea and vomiting)     MEDICATIONS AT HOME: Prior to Admission medications   Medication Sig Start Date End Date Taking? Authorizing Provider  acetaminophen (TYLENOL) 325 MG tablet Take 650 mg by mouth every 6 (six) hours as needed. Reported on 11/03/2015   Yes Historical Provider, MD  amitriptyline (ELAVIL) 25 MG tablet Take 1 tablet (25 mg total) by mouth at bedtime. 04/07/16   Maren Reamer, MD  diclofenac sodium (VOLTAREN) 1 % GEL Apply 2 g topically 4 (four) times daily. 04/07/16   Maren Reamer, MD  famotidine (PEPCID) 20 MG tablet Take 1 tablet (20 mg total) by mouth 2 (two) times daily. Patient not taking: Reported on 04/07/2016 02/03/16 02/02/17  Maren Reamer, MD  meloxicam (MOBIC) 15 MG tablet Take 1 tablet (15 mg total) by mouth daily. Take w/ food 04/07/16   Maren Reamer, MD  methocarbamol (ROBAXIN) 500 MG tablet Take 1 tablet (500 mg total) by mouth 4 (four) times daily. X 10 days then prn pain 04/07/16   Maren Reamer, MD  naproxen (NAPROSYN) 500 MG tablet Take 1 tablet (500 mg total) by mouth 2 (two) times daily with a meal. X 10 days then prn pain Patient not taking: Reported on 04/07/2016 03/09/16   Dionne Bucy McClung, PA-C  ondansetron (ZOFRAN) 8 MG tablet Take 1 tablet (8 mg total) by mouth every 8 (eight) hours as needed for nausea or vomiting. Patient not taking: Reported on 04/07/2016 03/09/16   Argentina Donovan, PA-C  senna (SENOKOT) 8.6 MG TABS tablet Take 1 tablet (8.6 mg total) by mouth daily as needed for mild constipation. Patient not taking: Reported on 04/07/2016 02/03/16   Maren Reamer, MD  traMADol (ULTRAM) 50 MG tablet Take 1 tablet (50 mg total) by  mouth every 8 (eight) hours as needed. Patient not taking: Reported on 04/07/2016 03/09/16   Argentina Donovan, PA-C  valACYclovir (VALTREX) 1000 MG tablet Take 1 tablet (1,000 mg total) by mouth 3 (three) times daily. For shingles Patient not taking: Reported on 04/07/2016 03/09/16   Argentina Donovan, PA-C     Objective:   Vitals:   04/07/16 1121  BP: (!) 106/46  Pulse: 68  Resp: 18  Temp: 97.9 F (36.6 C)  TempSrc: Oral  SpO2: 96%  Weight: 112 lb 3.2 oz (50.9 kg)    Exam General appearance : Awake, alert, not in any distress. Speech Clear. Not toxic looking,. Tired appearing, pleasant HEENT: Atraumatic and Normocephalic, pupils equally reactive to light.  No active  signs of tooth infection or gingivitis on my exam.  nttp on tooth exam, each tooth was palpated. oropharynx norml, turbinates normal, no signs of epistaxis Neck: supple, no JVD. No cervical lymphadenopathy.  Chest:Good air entry bilaterally, no added sounds. CVS: S1 S2 regular, no murmurs/gallups or rubs. Abdomen: Bowel sounds active, Non tender and not distended with no gaurding, rigidity or rebound. Extremities: B/L Lower Ext shows no edema, both legs are warm to touch Neurology: Awake alert, and oriented X 3, CN II-XII grossly intact, Non focal Skin:No Rash  Data Review Lab Results  Component Value Date   HGBA1C 5.5 04/04/2015    Depression screen [facility] 2/9 04/07/2016 03/09/2016 02/03/2016 11/03/2015 08/25/2015  Decreased Interest 3 1 3  0 0  Down, Depressed, Hopeless 2 1 1  0 0  PHQ - 2 Score 5 2 4  0 0  Altered sleeping 2 1 (No Data) - -  Tired, decreased energy 3 1 1  - -  Change in appetite 0 0 (No Data) - -  Feeling bad or failure about yourself  1 0 (No Data) - -  Trouble concentrating 3 1 (No Data) - -  Moving slowly or fidgety/restless 3 1 1  - -  Suicidal thoughts 0 0 (No Data) - -  PHQ-9 Score 17 6 - - -      Assessment & Plan   1. Cervicalgia/ ha / neck strain - suspect multifactorial, w/ component of family stressors and likely depression as well Denies si/hi/avh - trial amitriptyline 25mg  qhs - mobic 15mg  qd - prn robaxin, prn voltaren gel  2. Herpes zoster without complication  -resolved -  3. Bleeding gums, gingivitis? - recd warm water salt gargle at  Least 2 x day for inflammation, recd f/u w/ dentist - CBC with Differential - recd f/u w/ dentist, no signs of needing abx at this time. - if no improvement after dental fu and gargling w/ salt water - consider ENT referral  4. Acute nonintractable headache, unspecified headache type See #1  5. Dizzinesses Etiology unclear, ?anemia, headache induced - soft bp, no on antihypertensives, instructed pt to  liberalized salt more in diet to see if helps dizziness as well. - CBC with Differential - BASIC METABOLIC PANEL WITH GFR     Patient have been counseled extensively about nutrition and exercise  Return in about 3 months (around 07/08/2016), or if symptoms worsen or fail to improve.  The patient was given clear instructions to go to ER or return to medical center if symptoms don't improve, worsen or new problems develop. The patient verbalized understanding. The patient was told to call to get lab results if they haven't heard anything in the next week.   This note has been created with Dragon speech  Land. Any transcriptional errors are unintentional.   Maren Reamer, MD, [facility] [place], [place]   04/07/2016, 1:23 PM

## 2016-04-07 NOTE — Patient Instructions (Addendum)
Dolor de cabeza general sin causa (General Headache Without Cause) El dolor de cabeza es un dolor o malestar que se siente en la zona de la cabeza o del cuello. Puede no tener una causa especfica. Hay muchas causas y tipos de dolores de [place]. Los dolores de cabeza ms comunes son los siguientes:  Cefalea tensional.  Cefaleas migraosas.  Cefalea en brotes.  Cefaleas diarias crnicas. INSTRUCCIONES PARA EL CUIDADO EN EL HOGAR Controle su afeccin para ver si hay cambios. Siga estos pasos para Aeronautical engineer afeccin: Control del [employer] medicamentos de venta libre y los recetados solamente como se lo haya indicado el mdico.  Cuando sienta dolor de cabeza acustese en un cuarto oscuro y tranquilo.  Si se lo indican, aplique hielo sobre la cabeza y la zona del cuello:  Ponga el hielo en una bolsa plstica.  Coloque una toalla entre la piel y la bolsa de hielo.  Coloque el hielo durante 62minutos, 2 a 3veces por Training and development officer.  Utilice una almohadilla trmica o tome una ducha con agua caliente para aplicar calor en la cabeza y la zona del cuello como se lo haya indicado el [place] luces tenues si le [employer] luces brillantes o sus dolores de cabeza empeoran. Comida y bebida   Mantenga un horario para las comidas.  Limite el consumo de bebidas alcohlicas.  Consuma menos cantidad de cafena o deje de tomarla. Instrucciones generales   Concurra a todas las visitas de control como se lo haya indicado el mdico. Esto es importante.  Lleve un diario de los dolores de cabeza para Neurosurgeon qu factores pueden desencadenarlos. Por ejemplo, escriba los siguientes datos:  Lo que usted come y Buyer, retail.  Cunto tiempo duerme.  Algn cambio en su dieta o en los medicamentos.  Pruebe algunas tcnicas de relajacin, como los [place].  Limite el estrs.  Sintese con la espalda recta y no tense los msculos.  No consuma productos que contengan tabaco, incluidos  cigarrillos, tabaco de Higher education careers adviser o cigarrillos electrnicos. Si necesita ayuda para dejar de fumar, consulte al mdico.  Haga actividad fsica habitualmente como se lo haya indicado el mdico.  Tenga un horario fijo para dormir. Duerma entre 7 y 9horas o la cantidad de horas que le haya recomendado el mdico. SOLICITE ATENCIN MDICA SI:  Los medicamentos no Dealer los sntomas.  Tiene un dolor de cabeza que es diferente del dolor de cabeza habitual.  Tiene nuseas o vmitos.  Tiene fiebre. SOLICITE ATENCIN MDICA DE INMEDIATO SI:  El dolor se hace cada vez ms intenso.  Ha vomitado repetidas veces.  Presenta rigidez en el cuello.  Sufre prdida de la visin.  Tiene problemas para hablar.  Siente dolor en el ojo o en el odo.  Presenta debilidad muscular o prdida del control muscular.  Pierde el equilibrio o tiene problemas para Writer.  Sufre mareos o se desmaya.  Se siente confundido. Esta informacin no tiene Marine scientist el consejo del mdico. Asegrese de hacerle al mdico cualquier pregunta que tenga. Document Released: 02/17/2005 Document Revised: 01/29/2015 Document Reviewed: 09/02/2014 Elsevier Interactive Patient Education  2017 [place] musculoesqueltico (Musculoskeletal Pain) El dolor musculoesqueltico comprende dolores y Scientist, research (life sciences) en los msculos y en los [place]. Este dolor puede ocurrir en cualquier parte del cuerpo. INSTRUCCIONES PARA EL CUIDADO EN EL HOGAR  Solo tome medicamentos para calmar el dolor, el malestar o bajar la fiebre, segn las indicaciones del mdico.  Podr  seguir con todas las actividades a menos que stas le ocasionen ms [employer]. Cuando el dolor disminuya, retome las actividades habituales lentamente. Aumente gradualmente la intensidad y la duracin de sus actividades o del ejercicio.  Durante los perodos de dolor intenso, el reposo en cama puede ser beneficioso. Acustese o sintese en cualquier  posicin que sea cmoda, pero salga de la cama y camine al menos cada varias horas.  Si se lo indican, aplique hielo sobre la zona de la lesin.  Ponga el hielo en una bolsa plstica.  Coloque una toalla entre la piel y la bolsa de hielo.  Coloque el hielo durante 56minutos, 2 a 3veces por Training and development officer. SOLICITE ATENCIN MDICA SI:  El dolor empeora.  El dolor no se alivia con los [employer].  Pierde la funcionalidad en la zona del dolor si este se manifiesta en los brazos, las piernas o el cuello. Esta informacin no tiene Marine scientist el consejo del mdico. Asegrese de hacerle al mdico cualquier pregunta que tenga. Document Released: 02/17/2005 Document Revised: 08/02/2011 Document Reviewed: 01/12/2013 Elsevier Interactive Patient Education  2017 Elsevier Inc.    -  Gingivitis (Gingivitis) La gingivitis es el enrojecimiento, la irritacin y la hinchazn (inflamacin) de las encas. Es causada por grmenes (placa) que se acumula en los dientes y las encas. Esto sucede por la falta de cuidado, y limpieza de la boca y de los dientes (higiene bucal). Esta afeccin generalmente es leve y desaparece con un tratamiento y [place] limpieza adecuada de los dientes. CUIDADOS EN EL HOGAR  Siga las instrucciones del dentista sobre cmo limpiarse los dientes. Haga lo siguiente:  Cepllese los dientes con un cepillo dental de cerdas suaves dosveces al da.  Use el hilo dental al menos una vez al da. Utilcelo antes de [employer].  Use un enjuague bucal como se lo haya indicado el dentista.  Mantenga una dieta bien balanceada. Limite la ingesta de alimentos y bebidas azucarados entre comidas.  Visite al dentista regularmente para limpieza y control de los dientes.  No consuma productos que contengan tabaco. Estos incluyen cigarrillos, tabaco para mascar o cigarrillos electrnicos. Si necesita ayuda para dejar de fumar, consulte al mdico.  Concurra a todas las visitas de  control como se lo haya indicado el dentista. Esto es importante. SOLICITE AYUDA SI:  Tiene fiebre.  Tiene mucho sangrado de las encas.  Le duelen las encas o los dientes.  Presenta dificultad para Engineer, manufacturing systems.  Nota que algn diente est flojo o infectado.  Las glndulas del cuello o el rostro estn inflamadas. Esta informacin no tiene Marine scientist el consejo del mdico. Asegrese de hacerle al mdico cualquier pregunta que tenga. Document Released: 08/25/2010 Document Revised: 09/01/2015 Document Reviewed: 12/23/2014 Elsevier Interactive Patient Education  2017 [employer].

## 2016-04-20 ENCOUNTER — Telehealth: Payer: Self-pay | Admitting: Internal Medicine

## 2016-04-20 NOTE — Telephone Encounter (Signed)
Patient came to the office to inquire about her lab results. Please follow up. Pt came on 11/15.  Thank you.

## 2016-04-22 ENCOUNTER — Telehealth: Payer: Self-pay

## 2016-04-22 NOTE — Telephone Encounter (Signed)
Pt is aware of results and doesn't have any questions or concerns 

## 2016-04-22 NOTE — Telephone Encounter (Signed)
Pt. Called requesting her lab results. Pt. Also states that she is still bleeding from her mouth.  Please f/u

## 2016-04-22 NOTE — Telephone Encounter (Signed)
Contacted pt to go over lab results pt is aware of results and doesn't have any questions or concerns 

## 2016-04-30 ENCOUNTER — Emergency Department (HOSPITAL_COMMUNITY): Payer: Self-pay

## 2016-04-30 ENCOUNTER — Emergency Department (HOSPITAL_COMMUNITY)
Admission: EM | Admit: 2016-04-30 | Discharge: 2016-04-30 | Disposition: A | Discharge status: Home / Self Care | Payer: Self-pay | Attending: Emergency Medicine | Admitting: Emergency Medicine

## 2016-04-30 ENCOUNTER — Ambulatory Visit: Payer: Self-pay | Attending: Internal Medicine | Admitting: Physician Assistant

## 2016-04-30 ENCOUNTER — Encounter: Payer: Self-pay | Admitting: Physician Assistant

## 2016-04-30 ENCOUNTER — Encounter (HOSPITAL_COMMUNITY): Payer: Self-pay

## 2016-04-30 VITALS — BP 114/70 | HR 63 | Temp 97.8°F | Resp 16 | Wt 111.8 lb

## 2016-04-30 DIAGNOSIS — M542 Cervicalgia: Secondary | ICD-10-CM

## 2016-04-30 DIAGNOSIS — Z853 Personal history of malignant neoplasm of breast: Secondary | ICD-10-CM | POA: Insufficient documentation

## 2016-04-30 DIAGNOSIS — Z79899 Other long term (current) drug therapy: Secondary | ICD-10-CM | POA: Insufficient documentation

## 2016-04-30 DIAGNOSIS — M545 Low back pain: Secondary | ICD-10-CM | POA: Insufficient documentation

## 2016-04-30 DIAGNOSIS — Z87442 Personal history of urinary calculi: Secondary | ICD-10-CM | POA: Insufficient documentation

## 2016-04-30 DIAGNOSIS — Z86001 Personal history of in-situ neoplasm of cervix uteri: Secondary | ICD-10-CM | POA: Insufficient documentation

## 2016-04-30 DIAGNOSIS — M7918 Myalgia, other site: Secondary | ICD-10-CM

## 2016-04-30 DIAGNOSIS — Z859 Personal history of malignant neoplasm, unspecified: Secondary | ICD-10-CM

## 2016-04-30 DIAGNOSIS — M62838 Other muscle spasm: Secondary | ICD-10-CM

## 2016-04-30 DIAGNOSIS — Z8541 Personal history of malignant neoplasm of cervix uteri: Secondary | ICD-10-CM | POA: Insufficient documentation

## 2016-04-30 DIAGNOSIS — M546 Pain in thoracic spine: Secondary | ICD-10-CM

## 2016-04-30 MED ORDER — ORPHENADRINE CITRATE ER 100 MG PO TB12: 100.0000 mg | ORAL_TABLET | Freq: Two times a day (BID) | ORAL | 0 refills | Status: DC | PRN

## 2016-04-30 MED ORDER — MELOXICAM 15 MG PO TABS: 15.0000 mg | ORAL_TABLET | Freq: Every day | ORAL | 0 refills | Status: DC

## 2016-04-30 NOTE — Patient Instructions (Signed)
Go to [facility] and register as an outpatient for radiology for an xray of the neck

## 2016-04-30 NOTE — ED Provider Notes (Signed)
[facility] [facility] DEPT Provider Note   CSN: [id number] Arrival date & time: 04/30/16  1727  By signing my name below, I, [name], attest that this documentation has been prepared under the direction and in the presence of  Eliezer Mccoy, PA-C. Electronically Signed: [name], ED Scribe. 04/30/16. 6:54 PM.    History   Chief Complaint Chief Complaint  Patient presents with  . Neck Pain    HPI Crystal [name] is a 45 y.o. female who presents to the Emergency Department requesting cervical spine XR for ongoing posterior neck pain x 2 months. Pt was seen at her PCP today for evaluation of her pain and was referred to outpatient radiology for imaging; however, the office was closed. She denies injury, trauma or falls contributing to her neck pain. She describes her pain as a soreness, and reports her pain originates at the nape of her neck and radiates to bilateral shoulders. Pt states she was prescribed Robaxin for her pain last week but reports it no longer relieves her pain. Pt states she does not currently work or perform repetitive motions with her arms at home.  No h/o IVDU. She denies numbness or tingling in BUE or hands, CP, SOB, abdominal pain, nausea, vomiting, hematuria, dysuria, frequency, urgency, fever, saddle anesthesia, bowel or bladder incontinence, fever, night sweats, unexpected weight loss.   The history is provided by the patient. A language interpreter was used.    Past Medical History:  Diagnosis Date  . Cancer ([place])    cervical cancer dx.   . History of kidney stones    x1   . Kidney stones    cyst on kidney -no problems  . 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 PONV (postoperative nausea and vomiting)     Patient Active Problem List   Diagnosis Date Noted  . Herpes zoster without complication 0000000  . Depression 04/07/2016  . 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 Urinary retention with incomplete bladder  emptying 07/23/2015  . Post-operative nausea and vomiting 07/08/2015  . Cervical cancer ([place]) 07/01/2015  . Malignant neoplasm of cervix ([place]) 06/02/2015  . 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 Renal mass, right 04/14/2015  . Periodic health assessment, general screening, adult 02/23/2013    Past Surgical History:  Procedure Laterality Date  . BREAST LUMPECTOMY WITH RADIOACTIVE SEED LOCALIZATION Left 10/03/2015   Procedure: LEFT BREAST LUMPECTOMY WITH RADIOACTIVE SEED LOCALIZATION;  Surgeon: Crystal Skates, MD;  Location: [facility];  Service: General;  Laterality: Left;  . CERVICAL BIOPSY  W/ LOOP ELECTRODE EXCISION     12'16  . CESAREAN SECTION     2 previous  . ROBOTIC ASSISTED TOTAL HYSTERECTOMY Bilateral 07/01/2015   Procedure: XI ROBOTIC ASSISTED RADICAL TYPE II HYSTERECTOMY, BILATERAL SALPINGECTOMY, SENTINAL LYMPH NODE BIOPSY;  Surgeon: Crystal Amber, MD;  Location[facility];  Service: Gynecology;  Laterality: Bilateral;    OB History    Gravida Para Term Preterm AB Living   2 2 2     2    SAB TAB Ectopic Multiple Live Births                   Home Medications    Prior to Admission medications   Medication Sig Start Date End Date Taking? Authorizing Provider  amitriptyline (ELAVIL) 25 MG tablet Take 1 tablet (25 mg total) by mouth at bedtime. Patient not taking: Reported on 04/30/2016 04/07/16   Crystal T  Langeland, MD  diclofenac sodium (VOLTAREN) 1 % GEL Apply 2 g topically 4 (four) times daily. Patient not taking: Reported on 04/30/2016 04/07/16   Crystal Reamer, MD  famotidine (PEPCID) 20 MG tablet Take 1 tablet (20 mg total) by mouth 2 (two) times daily. Patient not taking: Reported on 04/30/2016 02/03/16 02/02/17  Crystal Reamer, MD  meloxicam (MOBIC) 15 MG tablet Take 1 tablet (15 mg total) by mouth daily. Take w/ food 04/30/16   Crystal Graff Makeila Yamaguchi, PA-C  orphenadrine (NORFLEX) 100 MG tablet Take 1 tablet (100 mg total) by mouth 2 (two) times  daily as needed for muscle spasms. 04/30/16   Crystal Kuster, PA-C  senna (SENOKOT) 8.6 MG TABS tablet Take 1 tablet (8.6 mg total) by mouth daily as needed for mild constipation. Patient not taking: Reported on 04/30/2016 02/03/16   Crystal Reamer, MD  valACYclovir (VALTREX) 1000 MG tablet Take 1 tablet (1,000 mg total) by mouth 3 (three) times daily. For shingles Patient not taking: Reported on 04/30/2016 03/09/16   Crystal Donovan, PA-C    Family History Family History  Problem Relation Age of Onset  . Hypertension Sister     Social History Social History  Substance Use Topics  . Smoking status: Never Smoker  . Smokeless tobacco: Never Used  . Alcohol use No     Comment: occasionally     Allergies   Patient has no known allergies.   Review of Systems Review of Systems  Constitutional: Negative for chills, fever and unexpected weight change.  HENT: Negative for facial swelling and sore throat.   Respiratory: Negative for shortness of breath.   Cardiovascular: Negative for chest pain.  Gastrointestinal: Negative for abdominal pain, nausea and vomiting.       No saddle anesthesia, bowel or bladder incontence  Genitourinary: Negative for dysuria, frequency, hematuria and urgency.  Musculoskeletal: Positive for neck pain. Negative for back pain.  Skin: Negative for rash and wound.  Neurological: Negative for numbness and headaches.  Psychiatric/Behavioral: The patient is not nervous/anxious.      Physical Exam Updated Vital Signs BP 122/76   Pulse 74   Temp 98 F (36.7 C) (Oral)   Resp 16   Ht 5' (1.524 m)   Wt 50.3 kg   LMP 06/07/2015   SpO2 100%   BMI 21.68 kg/m   Physical Exam  Constitutional: She appears well-developed and well-nourished. No distress.  HENT:  Head: Normocephalic and atraumatic.  Mouth/Throat: Oropharynx is clear and moist. No oropharyngeal exudate.  Eyes: Conjunctivae are normal. Pupils are equal, round, and reactive to light. Right eye  exhibits no discharge. Left eye exhibits no discharge. No scleral icterus.  Neck: Normal range of motion. Neck supple. No thyromegaly present.  Cardiovascular: Normal rate, regular rhythm and normal heart sounds.  Exam reveals no gallop and no friction rub.   No murmur heard. Pulmonary/Chest: Effort normal and breath sounds normal. No stridor. No respiratory distress. She has no wheezes. She has no rales.  Abdominal: Soft. Bowel sounds are normal. She exhibits no distension. There is no tenderness. There is no rebound and no guarding.  Musculoskeletal: She exhibits tenderness. She exhibits no edema.  Midline cervical, thoracic and mild lumbar spinal tenderness. Tenderness to C, T and L paraspinal musculature.   Lymphadenopathy:    She has no cervical adenopathy.  Neurological: She is alert. Coordination normal.  CN 3-12 intact; normal sensation throughout; 5/5 strength in all 4 extremities; equal bilateral grip strength  Skin: Skin is warm and dry. No rash noted. She is not diaphoretic. No pallor.  Psychiatric: She has a normal mood and affect.  Nursing note and vitals reviewed.    ED Treatments / Results   DIAGNOSTIC STUDIES: Oxygen Saturation is 100% on RA, normal by my interpretation.    COORDINATION OF CARE: 6:49 PM Discussed treatment plan with pt at bedside which includes XR, f/u with PCP as scheduled and pt agreed to plan.    Labs (all labs ordered are listed, but only abnormal results are displayed) Labs Reviewed - No data to display  EKG  EKG Interpretation None       Radiology Dg Cervical Spine Complete  Result Date: 04/30/2016 CLINICAL DATA:  Posterior cervical neck pain for 2 months. EXAM: CERVICAL SPINE - COMPLETE 4+ VIEW COMPARISON:  None. FINDINGS: Cervical spine alignment is maintained. Vertebral body heights and intervertebral disc spaces are preserved. The dens is intact. Posterior elements appear well-aligned. There is no evidence of fracture. No  prevertebral soft tissue edema. IMPRESSION: Negative cervical spine radiographs. Electronically Signed   By: Jeb Levering M.D.   On: 04/30/2016 19:41   Dg Thoracic Spine 2 View  Result Date: 04/30/2016 CLINICAL DATA:  Thoracic back pain for 2 months. Midline tenderness. EXAM: THORACIC SPINE 2 VIEWS COMPARISON:  None. FINDINGS: The alignment is maintained. Vertebral body heights are maintained. No significant disc space narrowing. Posterior elements appear intact. No fracture. No bony destructive process. There is no paravertebral soft tissue abnormality. IMPRESSION: Negative radiographs of the thoracic spine. Electronically Signed   By: Jeb Levering M.D.   On: 04/30/2016 19:43   Dg Lumbar Spine Complete  Result Date: 04/30/2016 CLINICAL DATA:  Lumbosacral back pain for 2 months. Midline tenderness. EXAM: LUMBAR SPINE - COMPLETE 4+ VIEW COMPARISON:  None. FINDINGS: The alignment is maintained. Vertebral body heights are normal. There is no listhesis. The posterior elements are intact. Disc spaces are preserved. No fracture. Sacroiliac joints are symmetric and normal. IMPRESSION: Negative radiographs of the lumbar spine. Electronically Signed   By: Jeb Levering M.D.   On: 04/30/2016 19:42    Procedures Procedures (including critical care time)  Medications Ordered in ED Medications - No data to display   Initial Impression / Assessment and Plan / ED Course  I have reviewed the triage vital signs and the nursing notes.  Pertinent labs & imaging results that were available during my care of the patient were reviewed by me and considered in my medical decision making (see chart for details).  Clinical Course     Patient with neck and back pain. X-rays of the cervical, thoracic, lumbar spine negative for acute fracture or abnormal findings. No neurological deficits and normal neuro exam.  Patient is ambulatory.  No loss of bowel or bladder control.  No concern for cauda equina.  No  fever, night sweats, weight loss, h/o cancer, IVDA, no recent procedure to back. No urinary symptoms suggestive of UTI. I encouraged patient to use heat and stretch at home. We discussed this in depth. I changed patient's Robaxin to Norflex of that she does not have to take the medicine as many times per day. I also refilled patient's Mobic. Supportive care and return precaution discussed. Follow-up to PCP at scheduled appointment. Patient understands and agrees with plan. Patient vitals stable throughout ED course and discharged in satisfactory condition.   Final Clinical Impressions(s) / ED Diagnoses   Final diagnoses:  Musculoskeletal pain  Neck pain  Acute bilateral  thoracic back pain    New Prescriptions Discharge Medication List as of 04/30/2016  8:54 PM    START taking these medications   Details  orphenadrine (NORFLEX) 100 MG tablet Take 1 tablet (100 mg total) by mouth 2 (two) times daily as needed for muscle spasms., Starting Fri 04/30/2016, Print        I personally performed the services described in this documentation, which was scribed in my presence. The recorded information has been reviewed and is accurate.    Crystal Kuster, PA-C 04/30/16 2158    Alfonzo Beers, MD 04/30/16 2206

## 2016-04-30 NOTE — Discharge Instructions (Signed)
Medications: Mobic, Norflex  Treatment: Take Norflex twice daily as needed for muscle pain and spasms. Take Mobic once daily as needed for pain. Use heat 3-4 times daily alternating 20 minutes on, 20 minutes off.  Follow-up: Please follow-up with your doctor at your scheduled appointment. Please return to the emergency department if he develop any new or worsening symptoms.

## 2016-04-30 NOTE — ED Triage Notes (Signed)
Pt. Was sent to [place] for a cervical spine x-ray.  X-ray is closed so she was sent to [place] for the xray.  Pt. Having posterior neck pain.   Pt. Was at [facility].  Pt. Speaks a little English.

## 2016-05-01 NOTE — Progress Notes (Signed)
Crystal [name], is a 45 y.o. female  [id number]  [id number]  DOB - 12-05-1970  Subjective:  Chief Complaint and HPI: Crystal [name] is a 45 y.o. female here today for continued neck pain X 2 months.  The previous treatment with amitryptiline, meloxicam, and robaxin have helped but she stopped taking them.  The pain occurs daily. The pain is unchanged.  Interpreters through [employer] used.  She is mostly concerned that she is having neck pain due to her previous cancer diagnosis.  She has followed all of the treatment recommendations and goes to all of her follow-up appts.  She denies dysphagia or ST.  No change in appetite.  She is still having some gum bleeding in the mornings sometimes.    ROS:   Constitutional:  No f/c, No night sweats, No unexplained weight loss. EENT:  No vision changes, No blurry vision, No hearing changes. No mouth(other than some bleeding gums in the mornings), no throat, or ear problems.  Respiratory: No cough, No SOB Cardiac: No CP, no palpitations GI:  No abd pain, No N/V/D. GU: No Urinary s/sx Musculoskeletal: +neck pain Neuro: No headache, no dizziness, no motor weakness.  Skin: No rash Endocrine:  No polydipsia. No polyuria.  Psych: Denies SI/HI  No problems updated.  ALLERGIES: No Known Allergies  PAST MEDICAL HISTORY: Past Medical History:  Diagnosis Date  . Cancer ([place])    cervical cancer dx.   . History of kidney stones    x1   . Kidney stones    cyst on kidney -no problems  . 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 PONV (postoperative nausea and vomiting)     MEDICATIONS AT HOME: Prior to Admission medications   Medication Sig Start Date End Date Taking? Authorizing Provider  amitriptyline (ELAVIL) 25 MG tablet Take 1 tablet (25 mg total) by mouth at bedtime. Patient not taking: Reported on 04/30/2016 04/07/16   Maren Reamer, MD  diclofenac sodium (VOLTAREN) 1 % GEL Apply 2 g  topically 4 (four) times daily. Patient not taking: Reported on 04/30/2016 04/07/16   Maren Reamer, MD  famotidine (PEPCID) 20 MG tablet Take 1 tablet (20 mg total) by mouth 2 (two) times daily. Patient not taking: Reported on 04/30/2016 02/03/16 02/02/17  Maren Reamer, MD  meloxicam (MOBIC) 15 MG tablet Take 1 tablet (15 mg total) by mouth daily. Take w/ food 04/30/16   Bea Graff Law, PA-C  orphenadrine (NORFLEX) 100 MG tablet Take 1 tablet (100 mg total) by mouth 2 (two) times daily as needed for muscle spasms. 04/30/16   Frederica Kuster, PA-C  senna (SENOKOT) 8.6 MG TABS tablet Take 1 tablet (8.6 mg total) by mouth daily as needed for mild constipation. Patient not taking: Reported on 04/30/2016 02/03/16   Maren Reamer, MD  valACYclovir (VALTREX) 1000 MG tablet Take 1 tablet (1,000 mg total) by mouth 3 (three) times daily. For shingles Patient not taking: Reported on 04/30/2016 03/09/16   Argentina Donovan, PA-C     Objective:  EXAM:   Vitals:   04/30/16 1619  BP: 114/70  Pulse: 63  Resp: 16  Temp: 97.8 F (36.6 C)  TempSrc: Oral  SpO2: 98%  Weight: 111 lb 12.8 oz (50.7 kg)    General appearance : A&OX3. NAD. Non-toxic-appearing HEENT: Atraumatic and Normocephalic.  PERRLA. EOM intact.  TM clear B.  MMM with no visible sores or active bleeding.  She is mildly TTP along B TMJ  Neck: supple, no JVD. No cervical lymphadenopathy. No thyromegaly.  No occipital nodes or submandibular nodes Chest/Lungs:  Breathing-non-labored, Good air entry bilaterally, breath sounds normal without rales, rhonchi, or wheezing  CVS: S1 S2 regular, no murmurs, gallops, rubs  Neck exam normal other than muscle spasm and TTP along the trapezius Extremities: Bilateral Lower Ext shows no edema, both legs are warm to touch with = pulse throughout.  BUE with full S&ROM, normal grip strength B hands.  DTR=B Neurology:  CN II-XII grossly intact, Non focal.   Psych:  TP linear. J/I WNL. Normal speech.  Appropriate eye contact and affect.  Skin:  No Rash  Data Review Lab Results  Component Value Date   HGBA1C 5.5 04/04/2015     Assessment & Plan   1. Neck pain She can use the amitripyline Dr Janne Napoleon prescribed as this was helping her sleep and helping the pain but she stopped taking it.  She can also take meloxicam prn. - DG Cervical Spine 2 or 3 views  2. Muscle spasm Use rabxain prn as this has helped  3. History of cancer She is attending to all of her follow-up appts and treatments.  Attempted to reassure her that the neck issue she is having is likely unrelated to a cancer recurrence and more likely just a musculoskeletal issue that is worsened by stress and worry.  I spent about an hour face to face with her answering many questions and reassuring her.  She has f/up appt on 05/14/2016 with gyn/oncology  4.  For the bleeding gums(see previous notes and she is up to date on dental care and CBC/Platelets normal)- -try sleeping with a bight block as she might be grinding her teeth at night and inflaming her gums causing them to bleed. Salt water gargles bid to promote gum healing.      Patient have been counseled extensively about nutrition and exercise  Return in about 1 month (around 05/31/2016) for f/up with dr langeland multiple issues; neck pain.  The patient was given clear instructions to go to ER or return to medical center if symptoms don't improve, worsen or new problems develop. The patient verbalized understanding. The patient was told to call to get lab results if they haven't heard anything in the next week.     Freeman Caldron, PA-C [facility] [place], [place]   05/01/2016, 11:21 AMPatient ID: Crystal [name], female   DOB: 1971/04/16, 45 y.o.   MRN: [id number]

## 2016-05-10 ENCOUNTER — Ambulatory Visit: Payer: No Typology Code available for payment source | Admitting: Family Medicine

## 2016-05-11 ENCOUNTER — Ambulatory Visit: Payer: No Typology Code available for payment source | Admitting: Family Medicine

## 2016-05-14 ENCOUNTER — Encounter: Payer: Self-pay | Admitting: Obstetrics & Gynecology

## 2016-05-14 ENCOUNTER — Ambulatory Visit (INDEPENDENT_AMBULATORY_CARE_PROVIDER_SITE_OTHER): Payer: Self-pay | Admitting: Obstetrics & Gynecology

## 2016-05-14 VITALS — BP 124/64 | HR 77 | Wt 109.6 lb

## 2016-05-14 DIAGNOSIS — Z1151 Encounter for screening for human papillomavirus (HPV): Secondary | ICD-10-CM

## 2016-05-14 DIAGNOSIS — D0502 Lobular carcinoma in situ of left breast: Secondary | ICD-10-CM

## 2016-05-14 DIAGNOSIS — C539 Malignant neoplasm of cervix uteri, unspecified: Secondary | ICD-10-CM

## 2016-05-14 NOTE — Patient Instructions (Signed)
Regrese a la clinica cuando tenga su cita. Si tiene problemas o preguntas, llama a la clinica o vaya a la sala de emergencia al [facility].    

## 2016-05-14 NOTE — Progress Notes (Signed)
GYNECOLOGY OFFICE VISIT NOTE  History:  45 y.o. G2P2002 here today for follow up; she underwent radical hysterectomy for cervical cancer in 06/2015.  Unfortunately, patient was also diagnosed with lobular CIS of left breast breast and underwent lumpectomy and radiation in 09/2015.  Since her illnesses, patient reports a myriad of symptoms including chronic constipation, hemoptysis for which she is evaluated by her PCP and other specialists.  She does report bilateral lateral breast pain, desires evaluation by [facility] specialist. She denies any abnormal vaginal discharge, bleeding, pelvic pain or other gynecologic concerns.   Patient is Spanish-speaking only, Spanish interpreter present for this encounter.   Past Medical History:  Diagnosis Date  . Cancer ([place])    cervical cancer dx.   . History of kidney stones    x1   . Kidney stones    cyst on kidney -no problems  . 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 PONV (postoperative nausea and vomiting)     Past Surgical History:  Procedure Laterality Date  . BREAST LUMPECTOMY WITH RADIOACTIVE SEED LOCALIZATION Left 10/03/2015   Procedure: LEFT BREAST LUMPECTOMY WITH RADIOACTIVE SEED LOCALIZATION;  Surgeon: Fanny Skates, MD;  Location: [facility];  Service: General;  Laterality: Left;  . CERVICAL BIOPSY  W/ LOOP ELECTRODE EXCISION     12'16  . CESAREAN SECTION     2 previous  . ROBOTIC ASSISTED TOTAL HYSTERECTOMY Bilateral 07/01/2015   Procedure: XI ROBOTIC ASSISTED RADICAL TYPE II HYSTERECTOMY, BILATERAL SALPINGECTOMY, SENTINAL LYMPH NODE BIOPSY;  Surgeon: Everitt Amber, MD;  [facility];  Service: Gynecology;  Laterality: Bilateral;    The following portions of the patient's history were reviewed and updated as appropriate: allergies, current medications, past family history, past medical history, past social history, past surgical history and problem list.    Review of Systems:  Pertinent items  noted in HPI and remainder of comprehensive ROS otherwise negative.   Objective:  Physical Exam BP 124/64   Pulse 77   Wt 109 lb 9.6 oz (49.7 kg)   LMP 06/07/2015   BMI 21.40 kg/m  CONSTITUTIONAL: Well-developed, well-nourished female in no acute distress.  HENT:  Normocephalic, atraumatic. External right and left ear normal. Oropharynx is clear and moist EYES: Conjunctivae and EOM are normal. Pupils are equal, round, and reactive to light. No scleral icterus.  NECK: Normal range of motion, supple, no masses SKIN: Skin is warm and dry. No rash noted. Not diaphoretic. No erythema. No pallor. NEUROLOGIC: Alert and oriented to person, place, and time. Normal reflexes, muscle tone coordination. No cranial nerve deficit noted. PSYCHIATRIC: Normal mood and affect. Normal behavior. Normal judgment and thought content. CARDIOVASCULAR: Normal heart rate noted RESPIRATORY: Effort and breath sounds normal, no problems with respiration noted ABDOMEN: Soft, no distention noted.   PELVIC: Normal appearing external genitalia; normal appearing vaginal mucosa and weel healed cuff.  Pap smear obtained.  No abnormal discharge noted.   MUSCULOSKELETAL: Normal range of motion. No edema noted.  Labs and Imaging Dg Cervical Spine Complete  Result Date: 04/30/2016 CLINICAL DATA:  Posterior cervical neck pain for 2 months. EXAM: CERVICAL SPINE - COMPLETE 4+ VIEW COMPARISON:  None. FINDINGS: Cervical spine alignment is maintained. Vertebral body heights and intervertebral disc spaces are preserved. The dens is intact. Posterior elements appear well-aligned. There is no evidence of fracture. No prevertebral soft tissue edema. IMPRESSION: Negative cervical spine radiographs. Electronically Signed   By: Jeb Levering M.D.   On: 04/30/2016 19:41  Dg Thoracic Spine 2 View  Result Date: 04/30/2016 CLINICAL DATA:  Thoracic back pain for 2 months. Midline tenderness. EXAM: THORACIC SPINE 2 VIEWS COMPARISON:  None.  FINDINGS: The alignment is maintained. Vertebral body heights are maintained. No significant disc space narrowing. Posterior elements appear intact. No fracture. No bony destructive process. There is no paravertebral soft tissue abnormality. IMPRESSION: Negative radiographs of the thoracic spine. Electronically Signed   By: Jeb Levering M.D.   On: 04/30/2016 19:43   Dg Lumbar Spine Complete  Result Date: 04/30/2016 CLINICAL DATA:  Lumbosacral back pain for 2 months. Midline tenderness. EXAM: LUMBAR SPINE - COMPLETE 4+ VIEW COMPARISON:  None. FINDINGS: The alignment is maintained. Vertebral body heights are normal. There is no listhesis. The posterior elements are intact. Disc spaces are preserved. No fracture. Sacroiliac joints are symmetric and normal. IMPRESSION: Negative radiographs of the lumbar spine. Electronically Signed   By: Jeb Levering M.D.   On: 04/30/2016 19:42    Assessment & Plan:  1. Malignant neoplasm of cervix, unspecified site [facility]) Pap smear done today, will follow up results and manage accordingly. - Cytology - PAP Will follow up GYN ONC recommendations  2. 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  Routine preventative health maintenance measures emphasized. Please refer to After Visit Summary for other counseling recommendations.   Total face-to-face time with patient: 25 minutes. Over 50% of encounter was spent on counseling and coordination of care.   Verita Schneiders, MD, [facility] Attending [place], [facility] for [employer], [place]

## 2016-05-19 LAB — CYTOLOGY - PAP
Diagnosis: NEGATIVE
HPV: NOT DETECTED

## 2016-05-21 ENCOUNTER — Emergency Department (HOSPITAL_COMMUNITY)
Admission: EM | Admit: 2016-05-21 | Discharge: 2016-05-22 | Disposition: A | Discharge status: Home / Self Care | Payer: Self-pay | Attending: Emergency Medicine | Admitting: Emergency Medicine

## 2016-05-21 ENCOUNTER — Encounter (HOSPITAL_COMMUNITY): Payer: Self-pay | Admitting: Neurology

## 2016-05-21 DIAGNOSIS — R112 Nausea with vomiting, unspecified: Secondary | ICD-10-CM | POA: Insufficient documentation

## 2016-05-21 DIAGNOSIS — Z8541 Personal history of malignant neoplasm of cervix uteri: Secondary | ICD-10-CM | POA: Insufficient documentation

## 2016-05-21 DIAGNOSIS — R1013 Epigastric pain: Secondary | ICD-10-CM | POA: Insufficient documentation

## 2016-05-21 DIAGNOSIS — R110 Nausea: Secondary | ICD-10-CM

## 2016-05-21 LAB — URINALYSIS, ROUTINE W REFLEX MICROSCOPIC
Bilirubin Urine: NEGATIVE
Glucose, UA: NEGATIVE mg/dL
Hgb urine dipstick: NEGATIVE
Ketones, ur: 20 mg/dL — AB
Leukocytes, UA: NEGATIVE
Nitrite: NEGATIVE
Protein, ur: NEGATIVE mg/dL
Specific Gravity, Urine: 1.016 (ref 1.005–1.030)
pH: 7 (ref 5.0–8.0)

## 2016-05-21 LAB — CBC
HCT: 41.4 % (ref 36.0–46.0)
Hemoglobin: 14.5 g/dL (ref 12.0–15.0)
MCH: 33 pg (ref 26.0–34.0)
MCHC: 35 g/dL (ref 30.0–36.0)
MCV: 94.3 fL (ref 78.0–100.0)
Platelets: 231 10*3/uL (ref 150–400)
RBC: 4.39 MIL/uL (ref 3.87–5.11)
RDW: 11.9 % (ref 11.5–15.5)
WBC: 7.9 10*3/uL (ref 4.0–10.5)

## 2016-05-21 LAB — LIPASE, BLOOD: Lipase: 39 U/L (ref 11–51)

## 2016-05-21 LAB — COMPREHENSIVE METABOLIC PANEL
ALBUMIN: 4.3 g/dL (ref 3.5–5.0)
ALK PHOS: 57 U/L (ref 38–126)
ALT: 20 U/L (ref 14–54)
ANION GAP: 8 (ref 5–15)
AST: 20 U/L (ref 15–41)
BUN: 16 mg/dL (ref 6–20)
CALCIUM: 9.6 mg/dL (ref 8.9–10.3)
CO2: 21 mmol/L — AB (ref 22–32)
Chloride: 109 mmol/L (ref 101–111)
Creatinine, Ser: 0.73 mg/dL (ref 0.44–1.00)
GFR calc Af Amer: >60 mL/min (ref >60)
GFR calc non Af Amer: >60 mL/min (ref >60)
GLUCOSE: 82 mg/dL (ref 65–99)
POTASSIUM: 4 mmol/L (ref 3.5–5.1)
SODIUM: 138 mmol/L (ref 135–145)
Total Bilirubin: 0.4 mg/dL (ref 0.3–1.2)
Total Protein: 7.8 g/dL (ref 6.5–8.1)

## 2016-05-21 MED ORDER — IOPAMIDOL (ISOVUE-300) INJECTION 61%
INTRAVENOUS | Status: AC
  Administered 2016-05-21: 100 mL
  Filled 2016-05-21: qty 100

## 2016-05-21 MED ORDER — IOPAMIDOL (ISOVUE-300) INJECTION 61%
INTRAVENOUS | Status: AC
  Administered 2016-05-21: 30 mL via ORAL
  Filled 2016-05-21: qty 30

## 2016-05-21 MED ORDER — SODIUM CHLORIDE 0.9 % IV BOLUS (SEPSIS)
1000.0000 mL | Freq: Once | INTRAVENOUS | Status: AC
  Administered 2016-05-21: 1000 mL via INTRAVENOUS

## 2016-05-21 MED ORDER — MORPHINE SULFATE (PF) 4 MG/ML IV SOLN
4.0000 mg | Freq: Once | INTRAVENOUS | Status: DC
  Filled 2016-05-21: qty 1

## 2016-05-21 MED ORDER — ONDANSETRON HCL 4 MG/2ML IJ SOLN
4.0000 mg | Freq: Once | INTRAMUSCULAR | Status: AC
Start: 2016-05-21 — End: 2016-05-22
  Administered 2016-05-22: 4 mg via INTRAVENOUS
  Filled 2016-05-21: qty 2

## 2016-05-21 MED ORDER — GI COCKTAIL ~~LOC~~
30.0000 mL | Freq: Once | ORAL | Status: AC
  Administered 2016-05-21: 30 mL via ORAL
  Filled 2016-05-21: qty 30

## 2016-05-21 MED ORDER — PANTOPRAZOLE SODIUM 40 MG PO TBEC
40.0000 mg | DELAYED_RELEASE_TABLET | Freq: Once | ORAL | Status: AC
  Administered 2016-05-21: 40 mg via ORAL
  Filled 2016-05-21: qty 1

## 2016-05-21 NOTE — ED Triage Notes (Signed)
Pt reports has been vomiting every night x 10 months and past 2 days has seen blood in emesis. Has been having a lot of nausea and has been having burning in her abdomen. Reports constipation and burning to middle abdomen.

## 2016-05-21 NOTE — ED Provider Notes (Signed)
[facility] DEPT Provider Note   CSN: [id number] Arrival date & time: 05/21/16  1444     History   Chief Complaint Chief Complaint  Patient presents with  . Emesis    HPI Crystal [name] is a 45 y.o. female.  HPI 45 yo F with PMHx of cervical and breast CA, kidney stones here with nausea, epigastric pain, and coughing versus vomiting blood for "weeks." Pt is difficult historian. She states that she previously had "a tumor" on her stomach that was removed and since then has had intermittent gagging/vomiting of blood-tinged mucus. Over the last several weeks, she has had progressively worsening epigastric pain that is gnawing, aching, and associated with intermittent spitting up/vomiting of blood tinged emesis. Denies any large volume bleeding. No melena or hematochezia. No fevers.   Past Medical History:  Diagnosis Date  . Cancer ([place])    cervical cancer dx.   . History of kidney stones    x1   . Kidney stones    cyst on kidney -no problems  . 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 PONV (postoperative nausea and vomiting)     Patient Active Problem List   Diagnosis Date Noted  . Herpes zoster without complication 0000000  . Depression 04/07/2016  . 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 Urinary retention with incomplete bladder emptying 07/23/2015  . Post-operative nausea and vomiting 07/08/2015  . Cervical cancer ([place]) 07/01/2015  . Malignant neoplasm of cervix ([place]) 06/02/2015  . 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 Renal mass, right 04/14/2015  . Periodic health assessment, general screening, adult 02/23/2013    Past Surgical History:  Procedure Laterality Date  . BREAST LUMPECTOMY WITH RADIOACTIVE SEED LOCALIZATION Left 10/03/2015   Procedure: LEFT BREAST LUMPECTOMY WITH RADIOACTIVE SEED LOCALIZATION;  Surgeon: Fanny Skates, MD;  Location: [facility];  Service: General;  Laterality:  Left;  . CERVICAL BIOPSY  W/ LOOP ELECTRODE EXCISION     12'16  . CESAREAN SECTION     2 previous  . ROBOTIC ASSISTED TOTAL HYSTERECTOMY Bilateral 07/01/2015   Procedure: XI ROBOTIC ASSISTED RADICAL TYPE II HYSTERECTOMY, BILATERAL SALPINGECTOMY, SENTINAL LYMPH NODE BIOPSY;  Surgeon: Everitt Amber, MD;  Location: [facility];  Service: Gynecology;  Laterality: Bilateral;    OB History    Gravida Para Term Preterm AB Living   2 2 2     2    SAB TAB Ectopic Multiple Live Births                   Home Medications    Prior to Admission medications   Medication Sig Start Date End Date Taking? Authorizing Provider  amitriptyline (ELAVIL) 25 MG tablet Take 1 tablet (25 mg total) by mouth at bedtime. Patient not taking: Reported on 05/14/2016 04/07/16   Maren Reamer, MD  diclofenac sodium (VOLTAREN) 1 % GEL Apply 2 g topically 4 (four) times Crystal. Patient not taking: Reported on 05/14/2016 04/07/16   Maren Reamer, MD  famotidine (PEPCID) 20 MG tablet Take 1 tablet (20 mg total) by mouth 2 (two) times Crystal. Patient not taking: Reported on 05/14/2016 02/03/16 02/02/17  Maren Reamer, MD  meloxicam (MOBIC) 15 MG tablet Take 1 tablet (15 mg total) by mouth Crystal. Take w/ food Patient not taking: Reported on 05/14/2016 04/30/16   Frederica Kuster, PA-C  orphenadrine (NORFLEX) 100 MG tablet Take 1 tablet (100 mg total) by mouth 2 (two) times  Crystal as needed for muscle spasms. 04/30/16   Frederica Kuster, PA-C  senna (SENOKOT) 8.6 MG TABS tablet Take 1 tablet (8.6 mg total) by mouth Crystal as needed for mild constipation. 02/03/16   Maren Reamer, MD  valACYclovir (VALTREX) 1000 MG tablet Take 1 tablet (1,000 mg total) by mouth 3 (three) times Crystal. For shingles Patient not taking: Reported on 05/14/2016 03/09/16   Argentina Donovan, PA-C    Family History Family History  Problem Relation Age of Onset  . Hypertension Sister     Social History Social History  Substance Use Topics  . Smoking  status: Never Smoker  . Smokeless tobacco: Never Used  . Alcohol use No     Comment: occasionally     Allergies   Patient has no known allergies.   Review of Systems Review of Systems  Constitutional: Positive for fatigue. Negative for chills and fever.  HENT: Negative for congestion, rhinorrhea and sore throat.   Eyes: Negative for visual disturbance.  Respiratory: Positive for cough. Negative for shortness of breath and wheezing.   Cardiovascular: Negative for chest pain and leg swelling.  Gastrointestinal: Positive for abdominal pain, nausea and vomiting. Negative for diarrhea.  Genitourinary: Negative for dysuria, flank pain, vaginal bleeding and vaginal discharge.  Musculoskeletal: Negative for neck pain.  Skin: Negative for rash.  Allergic/Immunologic: Negative for immunocompromised state.  Neurological: Negative for syncope and headaches.  Hematological: Does not bruise/bleed easily.  All other systems reviewed and are negative.    Physical Exam Updated Vital Signs BP 122/71   Pulse 61   Temp 97.7 F (36.5 C) (Oral)   Resp 15   Ht 5' (1.524 m)   Wt 109 lb (49.4 kg)   LMP 06/07/2015   SpO2 100%   BMI 21.29 kg/m   Physical Exam  Constitutional: She is oriented to person, place, and time. She appears well-developed and well-nourished. No distress.  HENT:  Head: Normocephalic and atraumatic.  Eyes: Conjunctivae are normal.  Neck: Neck supple.  Cardiovascular: Normal rate, regular rhythm and normal heart sounds.  Exam reveals no friction rub.   No murmur heard. Pulmonary/Chest: Effort normal and breath sounds normal. No respiratory distress. She has no wheezes. She has no rales.  Abdominal: Soft. Bowel sounds are normal. She exhibits no distension. There is tenderness (diffuse, but worst in epigastric area). There is no rebound and no guarding.  Musculoskeletal: She exhibits no edema.  Neurological: She is alert and oriented to person, place, and time. She  exhibits normal muscle tone.  Skin: Skin is warm. Capillary refill takes less than 2 seconds.  Psychiatric: She has a normal mood and affect.  Nursing note and vitals reviewed.    ED Treatments / Results  Labs (all labs ordered are listed, but only abnormal results are displayed) Labs Reviewed  COMPREHENSIVE METABOLIC PANEL - Abnormal; Notable for the following:       Result Value   CO2 21 (*)    All other components within normal limits  URINALYSIS, ROUTINE W REFLEX MICROSCOPIC - Abnormal; Notable for the following:    APPearance HAZY (*)    Ketones, ur 20 (*)    All other components within normal limits  LIPASE, BLOOD  CBC    EKG  EKG Interpretation  Date/Time:  Friday May 21 2016 16:24:40 EST Ventricular Rate:  82 PR Interval:  132 QRS Duration: 72 QT Interval:  376 QTC Calculation: 439 R Axis:   80 Text Interpretation:  Normal sinus  rhythm Septal infarct , age undetermined Abnormal ECG No old tracing to compare Confirmed by [facility] MD, Lysbeth Galas [phone]) on 05/21/2016 10:53:40 PM       Radiology No results found.  Procedures Procedures (including critical care time)  Medications Ordered in ED Medications  iopamidol (ISOVUE-300) 61 % injection (not administered)  iopamidol (ISOVUE-300) 61 % injection (not administered)  morphine 4 MG/ML injection 4 mg (not administered)  ondansetron (ZOFRAN) injection 4 mg (not administered)  gi cocktail (Maalox,Lidocaine,Donnatal) (30 mLs Oral Given 05/21/16 2259)  pantoprazole (PROTONIX) EC tablet 40 mg (40 mg Oral Given 05/21/16 2258)  sodium chloride 0.9 % bolus 1,000 mL (1,000 mLs Intravenous New Bag/Given 05/21/16 2303)     Initial Impression / Assessment and Plan / ED Course  I have reviewed the triage vital signs and the nursing notes.  Pertinent labs & imaging results that were available during my care of the patient were reviewed by me and considered in my medical decision making (see chart for  details).  Clinical Course     45 yo F with PMHx as above here with epigastric pain, nausea, and intermittent blood-tinged emesis. On arrival, VSS and WNL. Exam overall very reassuring and abdomen is soft, without peritonitis. Screening labs obtained in triage show normal CBC, normal Hgb. CMP with CO2 21, likely 2/2 mild dehydration, but o/w normal LFTs, Bili, and BUN. UA with 20 ketones c/w dehydration. No signs of DKA. Lipase normal.   Suspect sx are 2/2 gastritis versus PUD, and pt has h/o same. Denies NSAID or ASA use. She does eat a lot of spicy foods, however. No anemia, elevated BUN, or signs of brisk UGIB. No h/o cirrhosis or signs of varices. However, she does report possible h/o stomach CA so will obtain CT given vague sx, TTP on exam, and unclear duration/severity of blood-tinged emesis. PPI, sx control given in ED. Plan to f/u CXR, CT and dispo accordingly. Suspect that if CT neg, can be managed as outpt with PPI, GI referral.  Final Clinical Impressions(s) / ED Diagnoses   Final diagnoses:  Epigastric pain  Nausea      Duffy Bruce, MD 05/22/16 [phone]

## 2016-05-22 ENCOUNTER — Emergency Department (HOSPITAL_COMMUNITY): Payer: Self-pay

## 2016-05-22 MED ORDER — PANTOPRAZOLE SODIUM 20 MG PO TBEC: 20.0000 mg | DELAYED_RELEASE_TABLET | Freq: Every day | ORAL | 0 refills | Status: DC

## 2016-05-22 NOTE — ED Notes (Signed)
ED Provider at bedside. 

## 2016-05-22 NOTE — ED Notes (Signed)
Pt returned from CT °

## 2016-05-22 NOTE — Discharge Instructions (Signed)
Take your medication as prescribed.  Follow up with your PCP, you likely need to see a gastroenterologist.

## 2016-05-28 ENCOUNTER — Ambulatory Visit: Payer: Self-pay | Attending: Internal Medicine

## 2016-08-03 ENCOUNTER — Telehealth: Payer: Self-pay | Admitting: *Deleted

## 2016-08-03 NOTE — Telephone Encounter (Signed)
Crystal [name] called for the patient regarding an appt for follow up. Left the following number to be contacted [phone]. When I tried to return the call was told no one there with the name [name]. I tried calling twice after that with no answer.

## 2016-08-30 ENCOUNTER — Encounter: Payer: Self-pay | Admitting: Internal Medicine

## 2016-08-30 ENCOUNTER — Ambulatory Visit: Payer: No Typology Code available for payment source | Attending: Internal Medicine | Admitting: Internal Medicine

## 2016-08-30 VITALS — BP 124/77 | HR 65 | Temp 97.9°F | Resp 16 | Wt 111.4 lb

## 2016-08-30 DIAGNOSIS — Z79899 Other long term (current) drug therapy: Secondary | ICD-10-CM | POA: Insufficient documentation

## 2016-08-30 DIAGNOSIS — G4709 Other insomnia: Secondary | ICD-10-CM

## 2016-08-30 DIAGNOSIS — Z0000 Encounter for general adult medical examination without abnormal findings: Secondary | ICD-10-CM

## 2016-08-30 DIAGNOSIS — N644 Mastodynia: Secondary | ICD-10-CM | POA: Insufficient documentation

## 2016-08-30 DIAGNOSIS — K1379 Other lesions of oral mucosa: Secondary | ICD-10-CM

## 2016-08-30 DIAGNOSIS — Z87442 Personal history of urinary calculi: Secondary | ICD-10-CM | POA: Insufficient documentation

## 2016-08-30 DIAGNOSIS — Z90710 Acquired absence of both cervix and uterus: Secondary | ICD-10-CM | POA: Insufficient documentation

## 2016-08-30 DIAGNOSIS — G4700 Insomnia, unspecified: Secondary | ICD-10-CM | POA: Insufficient documentation

## 2016-08-30 DIAGNOSIS — C539 Malignant neoplasm of cervix uteri, unspecified: Secondary | ICD-10-CM | POA: Insufficient documentation

## 2016-08-30 DIAGNOSIS — Z853 Personal history of malignant neoplasm of breast: Secondary | ICD-10-CM | POA: Insufficient documentation

## 2016-08-30 MED ORDER — AMITRIPTYLINE HCL 50 MG PO TABS: 50.0000 mg | ORAL_TABLET | Freq: Every evening | ORAL | 3 refills | Status: DC | PRN

## 2016-08-30 MED ORDER — NAPROXEN 500 MG PO TABS
500.0000 mg | ORAL_TABLET | Freq: Two times a day (BID) | ORAL | 0 refills | Status: DC
Start: 2016-08-30 — End: 2016-12-06

## 2016-08-30 MED ORDER — FEXOFENADINE HCL 180 MG PO TABS: 180.0000 mg | ORAL_TABLET | Freq: Every day | ORAL | 3 refills | Status: DC

## 2016-08-30 MED FILL — ?FEXOFENADINE HCL 180 MG TA: 180 | 30 days supply | Qty: 30 | Fill #0

## 2016-08-30 NOTE — Progress Notes (Signed)
Crystal [name], is a 46 y.o. female  LKG:[id number]  GUY:[id number]  DOB - [birth year]  Chief Complaint  Patient presents with  . Allergies  . Breast Pain        Subjective:   Crystal [name] is a 46 y.o. female here today for a follow up visit.  Last seen Apr 07, 2016.  pmhx s/p robotic-assisted type III radical laparoscopic hysterectomy with bilateral salpingectomy and bilateral sentinel lymph node biopsy on 07/01/15.  No residual carcinoma on hysterectomy specimen - per Dr. Denman George note on 9/11/7.  +insomnia, dental issues, hx of benign bilat breast bx (benign), but had breast lumpectomy w/ radioactive seed localization 10/03/15.  Of note, she has been complains of left sided pain in same area as incisions currently. Of note, c/o of pain in her left arm and lower back as well.   She as still notes blood in her mouth when she wakes up at night. She has been seeing the dentist extensively, had 2 molars extracted. She states she last saw the dentist about 2 wks ago, and they cannot figure out where she is bleeding from.  She denies coughing/blowing her now/sneezing/epistaxis.  She has not slept well the last few days, w/ c/o of itchy /swollen eyes, denies nasal congestion though. She has tried eye drops and otc Zyrtec w/ minimal relief.  She is here w/ her young dgt.   Patient has No headache, No chest pain, No abdominal pain - No Nausea, No new weakness tingling or numbness, No Cough - SOB.  Interpreter was used to communicate directly with patient for the entire encounter including providing detailed patient instructions.  No problems updated.  ALLERGIES: No Known Allergies  PAST MEDICAL HISTORY: Past Medical History:  Diagnosis Date  . Cancer ([place])    cervical cancer dx.   . History of kidney stones    x1   . Kidney stones    cyst on kidney -no problems  . 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 PONV (postoperative nausea and vomiting)      MEDICATIONS AT HOME: Prior to Admission medications   Medication Sig Start Date End Date Taking? Authorizing Provider  amitriptyline (ELAVIL) 50 MG tablet Take 1 tablet (50 mg total) by mouth at bedtime as needed for sleep. 08/30/16   Maren Reamer, MD  diclofenac sodium (VOLTAREN) 1 % GEL Apply 2 g topically 4 (four) times daily. Patient not taking: Reported on 05/14/2016 04/07/16   Maren Reamer, MD  famotidine (PEPCID) 20 MG tablet Take 1 tablet (20 mg total) by mouth 2 (two) times daily. Patient not taking: Reported on 05/14/2016 02/03/16 02/02/17  Maren Reamer, MD  fexofenadine [facility] ALLERGY) 180 MG tablet Take 1 tablet (180 mg total) by mouth daily. 08/30/16   Maren Reamer, MD  meloxicam (MOBIC) 15 MG tablet Take 1 tablet (15 mg total) by mouth daily. Take w/ food Patient not taking: Reported on 05/14/2016 04/30/16   Frederica Kuster, PA-C  naproxen (NAPROSYN) 500 MG tablet Take 1 tablet (500 mg total) by mouth 2 (two) times daily with a meal. 08/30/16   Maren Reamer, MD  orphenadrine (NORFLEX) 100 MG tablet Take 1 tablet (100 mg total) by mouth 2 (two) times daily as needed for muscle spasms. 04/30/16   Frederica Kuster, PA-C  pantoprazole (PROTONIX) 20 MG tablet Take 1 tablet (20 mg total) by mouth daily. 05/22/16   Deno Etienne, DO  senna (SENOKOT) 8.6 MG TABS tablet  Take 1 tablet (8.6 mg total) by mouth daily as needed for mild constipation. 02/03/16   Maren Reamer, MD  valACYclovir (VALTREX) 1000 MG tablet Take 1 tablet (1,000 mg total) by mouth 3 (three) times daily. For shingles Patient not taking: Reported on 05/14/2016 03/09/16   Argentina Donovan, PA-C     Objective:   Vitals:   08/30/16 1647  BP: 124/77  Pulse: 65  Resp: 16  Temp: 97.9 F (36.6 C)  TempSrc: Oral  SpO2: 99%  Weight: 111 lb 6.4 oz (50.5 kg)    Exam General appearance : Awake, alert, not in any distress. Speech Clear. Not toxic looking, tired appearing. HEENT: Atraumatic and  Normocephalic, pupils equally reactive to light. Neck: supple, no JVD. No cervical lymphadenopathy.  Chest:Good air entry bilaterally, no added sounds. Breast /axilla:  She has noted yellow/old bruising on her bilat breast (- she states she sleeps w/ her young dgt at times and ended up on her dgt's toy few nights ago).  bilat nml appearance, not dippling noted. No palpable masses/nodules/nipple discharge noted on exam . Well healed surg excisions on the left breast noted.  CVS: S1 S2 regular, no murmurs/gallups or rubs. Abdomen: Bowel sounds active, Non tender and not distended with no gaurding, rigidity or rebound. Extremities: B/L Lower Ext shows no edema, both legs are warm to touch Neurology: Awake alert, and oriented X 3, CN II-XII grossly intact, Non focal Skin:No Rash  Data Review Lab Results  Component Value Date   HGBA1C 5.5 04/04/2015    Depression screen [facility] 2/9 08/30/2016 04/30/2016 04/07/2016 03/09/2016 02/03/2016  Decreased Interest 1 1 3 1 3   Down, Depressed, Hopeless 1 1 2 1 1   PHQ - 2 Score 2 2 5 2 4   Altered sleeping 1 0 2 1 (No Data)  Tired, decreased energy 2 2 3 1 1   Change in appetite 0 0 0 0 (No Data)  Feeling bad or failure about yourself  0 0 1 0 (No Data)  Trouble concentrating 1 1 3 1  (No Data)  Moving slowly or fidgety/restless 0 0 3 1 1   Suicidal thoughts 0 0 0 0 (No Data)  PHQ-9 Score 6 5 17 6  -      Assessment & Plan   1. Blood in mouth of unknown source - ongoing since November, has had extensive dental trx. Per pt eecent dental f/u 2 wks ago  And they could not tell where she is bleeding from. - Ambulatory referral to ENT  2. 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  3. Other insomnia vs fibromyalgia Increase elavil to 50mg  qhs prn, if too sedated in am, than cut to 1/2 pills again.  Insomnia discussed and tips provided for pt.  4. Healthcare maintenance - Vitamin D, 25-hydroxy  - CBC with Differential - CMP and Liver -  TSH  5. Breast pain, left Suspect msk pain, but given cancer hx, will order Left TOMO and left [place] for further eval. - MM SCREENING BREAST TOMO UNI L; Future - US BREAST LTD UNI LEFT INC AXILLA; Future     Patient have been counseled extensively about nutrition and exercise  Return in about 3 months (around 11/29/2016), or if symptoms worsen or fail to improve.  The patient was given clear instructions to go to ER or return to medical center if symptoms don't improve, worsen or new problems develop. The patient verbalized understanding. The patient was told to call to get lab results if  they haven't heard anything in the next week.   This note has been created with Surveyor, quantity. Any transcriptional errors are unintentional.   Maren Reamer, MD, [facility] [place], [place]   08/30/2016, 5:06 PM

## 2016-08-30 NOTE — Patient Instructions (Signed)
Insomnio (Insomnia) El insomnio es un trastorno del sueo que causa dificultades para conciliar el sueo o para [place]. Puede producir cansancio (fatiga), falta de energa, dificultad para concentrarse, cambios en el estado de nimo y mal rendimiento escolar o laboral. Hay tres formas diferentes de clasificar el insomnio:  Dificultad para conciliar el sueo.  Dificultad para mantener el sueo.  Despertar muy precoz por la maana. Cualquier tipo de insomnio puede ser a Barrister's clerk (crnico) o a Control and instrumentation engineer (agudo). Ambos son frecuentes. Generalmente, el insomnio a corto plazo dura tres meses o menos tiempo. El crnico ocurre al menos tres veces por semana durante ms de tres meses. CAUSAS El insomnio puede deberse a otra afeccin, situacin o sustancia, por ejemplo:  Ansiedad.  Algunos medicamentos.  Enfermedad por reflujo gastroesofgico (ERGE) u otras enfermedades gastrointestinales.  Asma y otras enfermedades respiratorias.  Sndrome de las piernas inquietas, apnea del sueo u otros trastornos del sueo.  Dolor crnico.  Menopausia, que puede incluir calores repentinos.  Ictus.  Consumo excesivo de alcohol, tabaco u drogas ilegales.  Depresin.  Cafena.  Trastornos neurolgicos, como enfermedad de Alzheimer.  Hiperactividad tiroidea (hipertiroidismo). Es posible que la causa del insomnio no se conozca. FACTORES DE RIESGO Los factores de riesgo de tener insomnio incluyen lo siguiente:  El sexo. La mujeres se ven ms afectadas que los hombres.  La edad. El insomnio es ms frecuente a medida que una persona envejece.  El estrs. Esto puede incluir su vida profesional o personal.  Los ingresos. El insomnio es ms frecuente en las personas cuyos ingresos son ms bajos.  La falta de actividad fsica.  Los horarios de trabajo irregulares o los turnos nocturnos.  Los viajes a lugares de diferentes zonas horarias. SIGNOS Y SNTOMAS Si tiene insomnio, el sntoma  principal es la dificultad para conciliar el sueo o mantenerlo. Esto puede derivar en otros sntomas, por ejemplo:  Sentirse fatigado.  Ponerse nervioso por [employer] irse a dormir.  No sentirse descansado por la maana.  Tener dificultad para concentrarse.  Sentirse irritable, ansioso o deprimido. TRATAMIENTO El tratamiento para el insomnio depende de la causa. Si se debe a una enfermedad preexistente, el tratamiento se centrar en el abordaje de la enfermedad. El tratamiento tambin puede incluir lo siguiente:  Medicamentos que lo ayuden a dormir.  Asesoramiento psicolgico o terapia.  Cambios en el estilo de vida. [place] los medicamentos solamente como se lo haya indicado el mdico.  Establezca horarios habituales para dormir y Clinical cytogeneticist. No tome siestas.  Lleve un registro del sueo ya que podra ser de utilidad para que usted y a su mdico puedan determinar qu podra estar causndole insomnio. Incluya lo siguiente:  Cundo duerme.  Cundo se despierta durante la noche.  Qu tan bien duerme.  Qu tan relajado se siente al da siguiente.  Cualquier efecto secundario de los medicamentos que toma.  Lo que usted come y bebe.  Convierta a su habitacin en un lugar cmodo donde sea fcil conciliar el sueo:  Coloque persianas o cortinas especiales oscuras que impidan la entrada de la luz del exterior.  Para bloquear los ruidos, use un aparato que reproduce sonidos ambientales o relajantes de fondo.  Mantenga baja la temperatura.  Haga ejercicio regularmente como se lo haya indicado el mdico. No haga ejercicio justo antes de la hora de [place].  Utilice tcnicas de relajacin para controlar el estrs. [place] al mdico que le sugiera algunas tcnicas que sean [place] para  usted. Estos pueden incluir lo siguiente:  Ejercicios de respiracin.  Rutinas para aliviar la tensin muscular.  Visualizacin de escenas  apacibles.  Disminucin del consumo de alcohol, bebidas con cafena y cigarrillos, especialmente cerca de la hora de [place], ya que pueden perturbarle el sueo.  No coma en exceso ni consuma comidas picantes justo antes de la hora de [place]. Esto puede causarle molestias digestivas y dificultades para dormir.  Limite el uso de pantallas antes de la hora de [place]. Esto incluye lo siguiente:  Mirar televisin.  Usar el telfono inteligente, la tableta y la computadora.  Siga una rutina. Esto puede ayudarlo a conciliar el sueo ms rpidamente. Intente hacer una actividad tranquila, cepillarse los dientes e irse a la cama a la misma hora todas las noches.  Levntese de la cama si sigue despierto despus de 45minutos de haber intentado dormirse. [place] luces, pero intente leer o hacer una actividad tranquila. Cuando tenga sueo, regrese a Futures trader.  Conduzca con cuidado. No conduzca si est muy somnoliento.  Concurra a todas las visitas de control, segn le indique su mdico. Esto es importante. SOLICITE ATENCIN MDICA SI:  Est cansado durante el da o tiene dificultades en su rutina diaria debido a la somnolencia.  Sigue teniendo problemas para dormir o Press photographer. SOLICITE ATENCIN MDICA DE INMEDIATO SI:  Tiene pensamientos serios acerca de lastimarse a usted mismo o daar a Nurse, children's. Esta informacin no tiene Marine scientist el consejo del mdico. Asegrese de hacerle al mdico cualquier pregunta que tenga. Document Released: 05/10/2005 Document Revised: 05/31/2014 Document Reviewed: 02/08/2014 Elsevier Interactive Patient Education  2017 [place] (Allergic Rhinitis) La rinitis alrgica ocurre cuando las membranas mucosas de la nariz responden a los alrgenos. Los alrgenos son las partculas que estn en el aire y que hacen que el cuerpo tenga una reaccin IT consultant. Esto hace que usted libere anticuerpos alrgicos. A  travs de una cadena de eventos, estos finalmente hacen que usted libere histamina en la corriente sangunea. Aunque la funcin de la histamina es proteger al organismo, es esta liberacin de histamina lo que provoca malestar, como los estornudos frecuentes, la congestin y goteo y Engineer, petroleum. CAUSAS La causa de la rinitis Regulatory affairs officer (fiebre del heno) son los alrgenos del polen que pueden provenir del csped, los rboles y Human resources officer. La causa de la rinitis Stage manager (rinitis alrgica perenne) son los alrgenos, como los caros del polvo domstico, la caspa de las mascotas y las esporas del moho. SNTOMAS  Secrecin nasal (congestin).  Goteo y picazn nasales con estornudos y Industrial/product designer. DIAGNSTICO Su mdico puede ayudarlo a Actor alrgeno o los alrgenos que desencadenan sus sntomas. Si usted y su mdico no pueden Teacher, adult education cul es el alrgeno, pueden hacerse anlisis de sangre o estudios de la piel. El mdico diagnosticar la afeccin despus de hacerle una historia clnica y un examen fsico. Adems, puede evaluarlo para detectar la presencia de otras enfermedades afines, como asma, conjuntivitis u otitis. TRATAMIENTO La rinitis alrgica no tiene [place], pero puede controlarse con lo siguiente:  Medicamentos que [employer] sntomas de [place], por ejemplo, vacunas contra la [place], aerosoles nasales y antihistamnicos por va oral.  Evitar el alrgeno. La fiebre del heno a menudo puede tratarse con antihistamnicos en las formas de pldoras o aerosol nasal. Los antihistamnicos bloquean los efectos de la histamina. Existen medicamentos de venta libre que pueden ayudar con la congestin nasal y la hinchazn alrededor de los ojos.  Consulte a su mdico antes de tomar o administrarse este medicamento. Si la prevencin del alrgeno o el medicamento recetado no dan resultado, existen muchos medicamentos nuevos que su mdico puede recetarle. Pueden usarse medicamentos  ms fuertes si las medidas iniciales no son efectivas. Pueden aplicarse inyecciones desensibilizantes si los medicamentos y la prevencin no funcionan. La desensibilizacin ocurre cuando un paciente recibe vacunas constantes hasta que el cuerpo se vuelve menos sensible al alrgeno. Asegrese de Chartered certified accountant seguimiento con su mdico si los problemas continan. INSTRUCCIONES PARA EL CUIDADO EN EL HOGAR No es posible evitar por completo los alrgenos, pero puede reducir los sntomas al tomar medidas para limitar su exposicin a ellos. Es muy til saber exactamente a qu es alrgico para que pueda evitar sus desencadenantes especficos. SOLICITE ATENCIN MDICA SI:  Jaclynn Guarneri.  Desarrolla una tos que no cesa fcilmente (persistente).  Le falta el aire.  Comienza a tener sibilancias.  Los sntomas interfieren con las actividades diarias normales. Esta informacin no tiene Marine scientist el consejo del mdico. Asegrese de hacerle al mdico cualquier pregunta que tenga. Document Released: 02/17/2005 Document Revised: 05/31/2014 Document Reviewed: 01/15/2013 Elsevier Interactive Patient Education  2017 [employer].

## 2016-08-31 ENCOUNTER — Telehealth: Payer: Self-pay | Admitting: *Deleted

## 2016-08-31 ENCOUNTER — Telehealth: Payer: Self-pay

## 2016-08-31 ENCOUNTER — Other Ambulatory Visit: Payer: Self-pay | Admitting: Internal Medicine

## 2016-08-31 LAB — CBC WITH DIFFERENTIAL/PLATELET
BASOS ABS: 0 10*3/uL (ref 0.0–0.2)
Basos: 0 %
EOS (ABSOLUTE): 0.2 10*3/uL (ref 0.0–0.4)
Eos: 2 %
Hematocrit: 42.2 % (ref 34.0–46.6)
Hemoglobin: 14.3 g/dL (ref 11.1–15.9)
IMMATURE GRANS (ABS): 0 10*3/uL (ref 0.0–0.1)
Immature Granulocytes: 0 %
LYMPHS: 29 %
Lymphocytes Absolute: 2.4 10*3/uL (ref 0.7–3.1)
MCH: 32.5 pg (ref 26.6–33.0)
MCHC: 33.9 g/dL (ref 31.5–35.7)
MCV: 96 fL (ref 79–97)
MONOS ABS: 0.5 10*3/uL (ref 0.1–0.9)
Monocytes: 5 %
NEUTROS ABS: 5.2 10*3/uL (ref 1.4–7.0)
Neutrophils: 64 %
PLATELETS: 260 10*3/uL (ref 150–379)
RBC: 4.4 x10E6/uL (ref 3.77–5.28)
RDW: 12.7 % (ref 12.3–15.4)
WBC: 8.3 10*3/uL (ref 3.4–10.8)

## 2016-08-31 LAB — CMP AND LIVER
ALBUMIN: 4.5 g/dL (ref 3.5–5.5)
ALK PHOS: 68 IU/L (ref 39–117)
ALT: 24 IU/L (ref 0–32)
AST: 17 IU/L (ref 0–40)
BUN: 11 mg/dL (ref 6–24)
Bilirubin Total: 0.4 mg/dL (ref 0.0–1.2)
Bilirubin, Direct: 0.13 mg/dL (ref 0.00–0.40)
CO2: 23 mmol/L (ref 18–29)
CREATININE: 0.66 mg/dL (ref 0.57–1.00)
Calcium: 9.1 mg/dL (ref 8.7–10.2)
Chloride: 99 mmol/L (ref 96–106)
GFR calc Af Amer: 122 mL/min/{1.73_m2} (ref >59)
GFR, EST NON AFRICAN AMERICAN: 106 mL/min/{1.73_m2} (ref >59)
GLUCOSE: 87 mg/dL (ref 65–99)
Potassium: 3.7 mmol/L (ref 3.5–5.2)
Sodium: 137 mmol/L (ref 134–144)
Total Protein: 7.5 g/dL (ref 6.0–8.5)

## 2016-08-31 LAB — VITAMIN D 25 HYDROXY (VIT D DEFICIENCY, FRACTURES): VIT D 25 HYDROXY: 16.5 ng/mL — AB (ref 30.0–100.0)

## 2016-08-31 LAB — TSH: TSH: 2.25 u[IU]/mL (ref 0.450–4.500)

## 2016-08-31 MED ORDER — VITAMIN D (ERGOCALCIFEROL) 1.25 MG (50000 UNIT) PO CAPS: 50000.0000 [IU] | ORAL_CAPSULE | ORAL | 0 refills | Status: DC

## 2016-08-31 MED FILL — NAPROXEN 500 MG TABLET: 500 | 15 days supply | Qty: 30 | Fill #0

## 2016-08-31 MED FILL — VIT D2 1.25 MG (50,000 UNIT: 1.25 MG | 84 days supply | Qty: 12 | Fill #0

## 2016-08-31 MED FILL — AMITRIPTYLINE HCL 50 MG TAB: 50 | 30 days supply | Qty: 30 | Fill #0

## 2016-08-31 NOTE — Telephone Encounter (Signed)
-----   Message from Maren Reamer, MD sent at 08/31/2016 10:44 AM EDT ----- Please call. Vit d very low, which could be causing her bone/body aches. Please call.  Vit D very low. Very low vit d, can cause bone/muscle pain.  rx for Vit D replacement in chart, take weekly.  Once done, buy OTC Vit D 5,000 IU and take daily.  Blood count/kidney function/thyroid all normal. thanks

## 2016-08-31 NOTE — Telephone Encounter (Signed)
CMA call to inform patient about lab results  Patient Verify DOB  Patient was aware and understood   

## 2016-08-31 NOTE — Telephone Encounter (Signed)
I have used the interpreter language line to contact the patient. Patient had a friend named Costella Hatcher called regarding appts. Patient thought appt was for April 13th, and was scheduled for April 23rd. Per patient request I have moved appt to this Friday. Patient aware and will call her friend. Patient will also add the friend to the chart

## 2016-09-01 ENCOUNTER — Ambulatory Visit: Payer: Self-pay | Attending: Internal Medicine

## 2016-09-03 ENCOUNTER — Ambulatory Visit: Payer: No Typology Code available for payment source | Attending: Gynecologic Oncology | Admitting: Gynecologic Oncology

## 2016-09-03 ENCOUNTER — Encounter: Payer: Self-pay | Admitting: Gynecologic Oncology

## 2016-09-03 ENCOUNTER — Other Ambulatory Visit (HOSPITAL_COMMUNITY)
Admission: RE | Admit: 2016-09-03 | Discharge: 2016-09-03 | Disposition: A | Discharge status: Home / Self Care | Payer: No Typology Code available for payment source | Source: Ambulatory Visit | Attending: Gynecologic Oncology | Admitting: Gynecologic Oncology

## 2016-09-03 VITALS — BP 110/61 | HR 80 | Temp 97.9°F | Resp 20 | Wt 110.5 lb

## 2016-09-03 DIAGNOSIS — Z87442 Personal history of urinary calculi: Secondary | ICD-10-CM | POA: Insufficient documentation

## 2016-09-03 DIAGNOSIS — Z8541 Personal history of malignant neoplasm of cervix uteri: Secondary | ICD-10-CM

## 2016-09-03 DIAGNOSIS — Z87898 Personal history of other specified conditions: Secondary | ICD-10-CM | POA: Insufficient documentation

## 2016-09-03 DIAGNOSIS — Z8249 Family history of ischemic heart disease and other diseases of the circulatory system: Secondary | ICD-10-CM | POA: Insufficient documentation

## 2016-09-03 DIAGNOSIS — K5900 Constipation, unspecified: Secondary | ICD-10-CM | POA: Insufficient documentation

## 2016-09-03 DIAGNOSIS — C539 Malignant neoplasm of cervix uteri, unspecified: Secondary | ICD-10-CM | POA: Insufficient documentation

## 2016-09-03 DIAGNOSIS — Z90710 Acquired absence of both cervix and uterus: Secondary | ICD-10-CM | POA: Insufficient documentation

## 2016-09-03 DIAGNOSIS — Z79899 Other long term (current) drug therapy: Secondary | ICD-10-CM | POA: Insufficient documentation

## 2016-09-03 DIAGNOSIS — Z08 Encounter for follow-up examination after completed treatment for malignant neoplasm: Secondary | ICD-10-CM | POA: Insufficient documentation

## 2016-09-03 DIAGNOSIS — Z90722 Acquired absence of ovaries, bilateral: Secondary | ICD-10-CM

## 2016-09-03 NOTE — Patient Instructions (Signed)
Dr Denman George said to follow up with Dr. Harolyn Rutherford in 3 months. Follow up with Dr. Denman George in October. Call in August to set up appointment.

## 2016-09-03 NOTE — Progress Notes (Signed)
Follow Up Note: Gyn-Onc  Crystal [name] 46 y.o. female  CC:  Chief Complaint  Patient presents with  . history of cervical cancer   Assessment/Plan:  46 year old s/p robotic-assisted type III radical laparoscopic hysterectomy with bilateral salpingectomy and bilateral sentinel lymph node biopsy on 07/01/15.  No residual carcinoma on hysterectomy specimen. Low risk features to her tumor therefore no adjuvant therapy recommended. No evidence for recurrence on today's exam.   1/ we will continue annual pap surveillance for this with hpv cotesting (next due in March, 2019).  2/ I discussed symptoms concerning for recurrence including vaginal bleeding or discharge, pelvic pain, cough, or lower extremity edema.  3/ Follow-up for well woman care with Dr Harolyn Rutherford in 3 months and I will see her back in 6 months.   HPI: Crystal [name] is a 46 year old female initially referred by Dr. 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  She had her first abnormal Pap smear on 02/26/2015 which was HGSIL. She then underwent colposcopic evaluation of the cervix on 04/02/2015, which was benign in appearance. A biopsy from the ectocervix was benign and endocervical curetting showed CIN-3. As follow-up, she underwent a LEEP procedure on 05/12/2015. 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 Dimensions of the carcinoma were not included however there was a focus of dysplasia suspicious for invasive carcinoma involving the deep margin.  PET on 06/20/15: IMPRESSION: 1. No primary hypermetabolic primary cervical mass or evidence of nodal metastasis. 2. Focal hypermetabolism in the lateral left breast. No CT correlate. Consider correlation with diagnostic mammography and ultrasound to exclude otherwise occult breast lesion. 3. Probable left nephrolithiasis  On 07/01/15, she underwent a robotic-assisted  type III radical laparoscopic hysterectomy with bilateral salpingoophorectomy and bilateral pelvic lymphadenectomy by Dr. Denman George.  Final pathology revealed: Diagnosis 1. Uterus +/- tubes/ovaries, neoplastic, cervix CERVIX WITH LOW GRADE SQUAMOUS INTRAEPITHELIAL LESION, CIN-1 NO RESIDUAL HIGH GRADE INTRAEPITHELIAL LESION OR INVASIVE NEOPLASM IDENTIFIED VAGINAL CUFF, PARAMETRIAL AND ALL RESECTION MARGINS ARE NEGATIVE FOR TUMOR ENDOMETRIAL POLYP AND INACTIVE ENDOMETRIUM MYOMETRIUM: NO PATHOLOGICAL ALTERATIONS BILATERAL FALLOPIAN TUBES: HISTOLOGICAL UNREMARKABLE 2. Lymph node, sentinel, biopsy, right external iliac ONE BENIGN LYMPH NODE (0/1) 3. Lymph node, sentinel, biopsy, left obturator ONE BENIGN LYMPH NODE (0/1)  She was re-admitted to the hospital on POD 7, 07/08/15, for persistent nausea, vomiting, dehydration, no BM, dyspnea.  CT CAP on 07/08/15 failed to demonstrate evidence for intraperitoneal abscess/GU or GI injury or pathology including no obstruction. UA and culture revealed a catheter associated UTI (e coli - pan sensitive) which was treated with cipro. She was discharged home on 07/10/15 after IV hydration, aggressive bowel regimen.  She failed a voiding trial during her hospitalization and the foley had to be replaced due to urinary retention.  She presented to the office on 07/15/15 for a repeat voiding trial. She passed this well and her foley was left out, however she subsequently developed urinary retention and required teaching to intermittently self catheterize.  On 10/03/2015 she underwent a breast lumpectomy on the left which confirmed lobular hyperplasia but no carcinoma in situ identified. No additional therapy is required for this.  Interval History:   She denies vaginal bleeding, LE edema. She does have constipation. She needs to strain to void urine but no longer catheterizes. She continues to need to strain to have bowel movements.  Review of Systems  Constitutional:  Feels well.  No  fever, chills, early satiety, or unintentional weight loss or gain.  Cardiovascular: No chest pain, shortness of breath, or edema.  Pulmonary: No cough or wheeze.  Gastrointestinal: No nausea, vomiting, or diarrhea. No bright red blood per rectum or change in bowel movement. + constipation Genitourinary: Minimal bladder sensation. No vaginal bleeding or discharge.  Musculoskeletal: No myalgia or joint pain. Neurologic: No weakness, numbness, or change in gait.  Psychology: No depression, anxiety, or insomnia.  Current Meds:  Outpatient Encounter Prescriptions as of 09/03/2016  Medication Sig  . amitriptyline (ELAVIL) 50 MG tablet Take 1 tablet (50 mg total) by mouth at bedtime as needed for sleep.  . fexofenadine (ALLEGRA ALLERGY) 180 MG tablet Take 1 tablet (180 mg total) by mouth daily.  . naproxen (NAPROSYN) 500 MG tablet Take 1 tablet (500 mg total) by mouth 2 (two) times daily with a meal.  . Vitamin D, Ergocalciferol, (DRISDOL) 50000 units CAPS capsule Take 1 capsule (50,000 Units total) by mouth every 7 (seven) days.  . diclofenac sodium (VOLTAREN) 1 % GEL Apply 2 g topically 4 (four) times daily. (Patient not taking: Reported on 05/14/2016)  . famotidine (PEPCID) 20 MG tablet Take 1 tablet (20 mg total) by mouth 2 (two) times daily. (Patient not taking: Reported on 05/14/2016)  . meloxicam (MOBIC) 15 MG tablet Take 1 tablet (15 mg total) by mouth daily. Take w/ food (Patient not taking: Reported on 05/14/2016)  . pantoprazole (PROTONIX) 20 MG tablet Take 1 tablet (20 mg total) by mouth daily. (Patient not taking: Reported on 09/03/2016)  . senna (SENOKOT) 8.6 MG TABS tablet Take 1 tablet (8.6 mg total) by mouth daily as needed for mild constipation. (Patient not taking: Reported on 09/03/2016)  . valACYclovir (VALTREX) 1000 MG tablet Take 1 tablet (1,000 mg total) by mouth 3 (three) times daily. For shingles (Patient not taking: Reported on 05/14/2016)  . [DISCONTINUED]  orphenadrine (NORFLEX) 100 MG tablet Take 1 tablet (100 mg total) by mouth 2 (two) times daily as needed for muscle spasms. (Patient not taking: Reported on 09/03/2016)   No facility-administered encounter medications on file as of 09/03/2016.     Allergy: No Known Allergies  Social Hx:   Social History   Social History  . Marital status: Widowed    Spouse name: N/A  . Number of children: N/A  . Years of education: N/A   Occupational History  . Not on file.   Social History Main Topics  . Smoking status: Never Smoker  . Smokeless tobacco: Never Used  . Alcohol use No     Comment: occasionally  . Drug use: No  . Sexual activity: Yes    Birth control/ protection: Surgical   Other Topics Concern  . Not on file   Social History Narrative  . No narrative on file    Past Surgical Hx:  Past Surgical History:  Procedure Laterality Date  . BREAST LUMPECTOMY WITH RADIOACTIVE SEED LOCALIZATION Left 10/03/2015   Procedure: LEFT BREAST LUMPECTOMY WITH RADIOACTIVE SEED LOCALIZATION;  Surgeon: Fanny Skates, MD;  Location: [facility];  Service: General;  Laterality: Left;  . CERVICAL BIOPSY  W/ LOOP ELECTRODE EXCISION     12'16  . CESAREAN SECTION     2 previous  . ROBOTIC ASSISTED TOTAL HYSTERECTOMY Bilateral 07/01/2015   Procedure: XI ROBOTIC ASSISTED RADICAL TYPE II HYSTERECTOMY, BILATERAL SALPINGECTOMY, SENTINAL LYMPH NODE BIOPSY;  Surgeon: Everitt Amber, MD;  Location: [facility];  Service: Gynecology;  Laterality: Bilateral;    Past  Medical Hx:  Past Medical History:  Diagnosis Date  . Cancer ([place])    cervical cancer dx.   . History of kidney stones    x1   . Kidney stones    cyst on kidney -no problems  . 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 PONV (postoperative nausea and vomiting)     Family Hx:  Family History  Problem Relation Age of Onset  . Hypertension Sister     Vitals:  Blood pressure 110/61, pulse 80, temperature 97.9 F (36.6  C), temperature source Oral, resp. rate 20, weight 110 lb 8 oz (50.1 kg), last menstrual period 06/07/2015.  Physical Exam:  General: Well developed, well nourished female in no acute distress. Alert and oriented x 3.  Cardiovascular: Regular rate and rhythm. S1 and S2 normal.  Lungs: Clear to auscultation bilaterally. No wheezes/crackles/rhonchi noted.  Skin: No rashes or lesions present. Back: No CVA tenderness.  Abdomen: Abdomen soft, non-tender and non-obese. Active bowel sounds in all quadrants. No evidence of a fluid wave or abdominal masses.  Lap sites healed Extremities: No bilateral cyanosis, edema, or clubbing.  Pelvic exam: No residual granulation tissue at cuff. No nodulariy or lesions. Rectal: smooth, no nodularity or masses   Donaciano Eva, MD  09/03/2016, 4:13 PM

## 2016-09-07 LAB — CYTOLOGY - PAP
DIAGNOSIS: NEGATIVE
HPV (WINDOPATH): NOT DETECTED

## 2016-09-13 ENCOUNTER — Ambulatory Visit: Payer: Self-pay | Admitting: Gynecologic Oncology

## 2016-09-15 ENCOUNTER — Other Ambulatory Visit (HOSPITAL_COMMUNITY): Payer: Self-pay | Admitting: *Deleted

## 2016-09-15 DIAGNOSIS — N644 Mastodynia: Secondary | ICD-10-CM

## 2016-09-21 ENCOUNTER — Telehealth: Payer: Self-pay

## 2016-09-21 NOTE — Telephone Encounter (Signed)
Told Crystal [name]'s brother that the Pap Smear was normal from 09-03-16 visit.  Pt does not speak english and brother will give her the information.

## 2016-09-27 ENCOUNTER — Ambulatory Visit (INDEPENDENT_AMBULATORY_CARE_PROVIDER_SITE_OTHER): Payer: No Typology Code available for payment source | Admitting: Otolaryngology

## 2016-09-27 DIAGNOSIS — K219 Gastro-esophageal reflux disease without esophagitis: Secondary | ICD-10-CM

## 2016-09-27 DIAGNOSIS — R070 Pain in throat: Secondary | ICD-10-CM

## 2016-09-28 ENCOUNTER — Ambulatory Visit (HOSPITAL_COMMUNITY)
Admission: RE | Admit: 2016-09-28 | Discharge: 2016-09-28 | Disposition: A | Discharge status: Home / Self Care | Payer: Self-pay | Source: Ambulatory Visit | Attending: Obstetrics and Gynecology | Admitting: Obstetrics and Gynecology

## 2016-09-28 ENCOUNTER — Ambulatory Visit
Admission: RE | Admit: 2016-09-28 | Discharge: 2016-09-28 | Disposition: A | Discharge status: Home / Self Care | Payer: No Typology Code available for payment source | Source: Ambulatory Visit | Attending: Obstetrics and Gynecology | Admitting: Obstetrics and Gynecology

## 2016-09-28 VITALS — BP 106/63 | HR 70 | Temp 98.1°F | Ht <= 58 in | Wt 113.0 lb

## 2016-09-28 DIAGNOSIS — Z1239 Encounter for other screening for malignant neoplasm of breast: Secondary | ICD-10-CM

## 2016-09-28 DIAGNOSIS — N6311 Unspecified lump in the right breast, upper outer quadrant: Secondary | ICD-10-CM

## 2016-09-28 DIAGNOSIS — N644 Mastodynia: Secondary | ICD-10-CM

## 2016-09-28 NOTE — Progress Notes (Signed)
Complaints of left outer breast pain that is diffuse at times. Patient stated pain started 3 months ago. Patient rated pain at a 5-6 out of 10.  Pap Smear: Pap smear not completed today.Last Pap smear was 09/03/2016 at the [facility] and normal with negative HPV. Patient has a history of an abnormal Pap smear 02/26/2015 at the [facility] that was HGSIL. 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 Patient had a complete hysterectomy 07/01/2015 for cervical cancer. Patient is being followed by GYN Oncology at the [facility] for follow up. Per patient her Pap smear on 02/26/2015 is the only abnormal Pap smear she has had. Last three Pap smears, colposcopy, LEEP, and hysterectomy results are all in EPIC.   Physical exam: Breasts Breasts symmetrical. No skin abnormalities bilateral breasts. No nipple retraction bilateral breasts. No nipple discharge bilateral breasts. No lymphadenopathy. No lumps palpated left breast. Palpated a mobile lump within the right breast at 10 o'clock 4 cm from the nipple. Complaints of bilateral outer breast tenderness on exam. Referred patient to the [facility] for diagnostic mammogram and possible right breast ultrasound. Appointment scheduled for Tuesday, Sep 28, 2016 at 1450      Pelvic/Bimanual No Pap smear completed today since last Pap smear and HPV typing was 09/03/2016. Pap smear not indicated per BCCCP guidelines.   Smoking History: Patient has never smoked.  Patient Navigation: Patient education provided. Access to services provided for patient through [facility] program. Spanish interpreter provided.  Used Spanish interpreter [employer] from [facility].

## 2016-09-28 NOTE — Patient Instructions (Signed)
Explained breast self awareness with Cyril Mourning Ramirez-Flores. Patient did not need a Pap smear today due to last Pap smear and HPV typing was 09/03/2016. Patient is being followed by GYN Oncology at [facility] for Pap smears. Referred patient to the [facility] [place] for diagnostic mammogram and possible right breast ultrasound. Appointment scheduled for Tuesday, Sep 28, 2016 at 1450.  Alayna Mabe Ramirez-Flores verbalized understanding.  Derrel Moore, Arvil Chaco, RN 4:36 PM

## 2016-09-29 ENCOUNTER — Encounter (HOSPITAL_COMMUNITY): Payer: Self-pay | Admitting: *Deleted

## 2016-10-05 ENCOUNTER — Encounter: Payer: Self-pay | Admitting: Internal Medicine

## 2016-10-05 NOTE — Progress Notes (Signed)
Pt seen by ENT 09/27/16, Dr Kasandra Knudsen Raynelle Bring, MD  - severe posterior laryngeal edema noted, c/w laryngopharyngeal reflux.  No other suspicious lesions noted. - no obvious source of bleeding in nasal passages or upper airway. - right ear nevus w/ reactive lymph node. - started on omeprazole 20 qd. - fu 6wks  Will scan notes.

## 2016-10-07 ENCOUNTER — Encounter: Payer: Self-pay | Admitting: Internal Medicine

## 2016-10-08 ENCOUNTER — Encounter: Payer: Self-pay | Admitting: Internal Medicine

## 2016-10-11 ENCOUNTER — Encounter: Payer: Self-pay | Admitting: Internal Medicine

## 2016-11-15 ENCOUNTER — Ambulatory Visit: Payer: Self-pay | Attending: Internal Medicine

## 2016-11-15 ENCOUNTER — Ambulatory Visit (INDEPENDENT_AMBULATORY_CARE_PROVIDER_SITE_OTHER): Payer: Self-pay | Admitting: Otolaryngology

## 2016-11-15 DIAGNOSIS — K219 Gastro-esophageal reflux disease without esophagitis: Secondary | ICD-10-CM

## 2016-11-15 DIAGNOSIS — R070 Pain in throat: Secondary | ICD-10-CM

## 2016-12-06 ENCOUNTER — Ambulatory Visit: Payer: Self-pay | Attending: Family Medicine | Admitting: Family Medicine

## 2016-12-06 ENCOUNTER — Encounter: Payer: Self-pay | Admitting: Family Medicine

## 2016-12-06 VITALS — BP 123/78 | HR 84 | Temp 98.1°F | Resp 16 | Wt 115.0 lb

## 2016-12-06 DIAGNOSIS — Z90710 Acquired absence of both cervix and uterus: Secondary | ICD-10-CM | POA: Insufficient documentation

## 2016-12-06 DIAGNOSIS — Z8541 Personal history of malignant neoplasm of cervix uteri: Secondary | ICD-10-CM | POA: Insufficient documentation

## 2016-12-06 DIAGNOSIS — K2950 Unspecified chronic gastritis without bleeding: Secondary | ICD-10-CM | POA: Insufficient documentation

## 2016-12-06 DIAGNOSIS — K219 Gastro-esophageal reflux disease without esophagitis: Secondary | ICD-10-CM | POA: Insufficient documentation

## 2016-12-06 DIAGNOSIS — K2970 Gastritis, unspecified, without bleeding: Secondary | ICD-10-CM | POA: Insufficient documentation

## 2016-12-06 DIAGNOSIS — Z79899 Other long term (current) drug therapy: Secondary | ICD-10-CM | POA: Insufficient documentation

## 2016-12-06 DIAGNOSIS — K2951 Unspecified chronic gastritis with bleeding: Secondary | ICD-10-CM

## 2016-12-06 DIAGNOSIS — M542 Cervicalgia: Secondary | ICD-10-CM | POA: Insufficient documentation

## 2016-12-06 DIAGNOSIS — Z9889 Other specified postprocedural states: Secondary | ICD-10-CM | POA: Insufficient documentation

## 2016-12-06 DIAGNOSIS — Z853 Personal history of malignant neoplasm of breast: Secondary | ICD-10-CM | POA: Insufficient documentation

## 2016-12-06 MED ORDER — METHOCARBAMOL 750 MG PO TABS: 750.0000 mg | ORAL_TABLET | Freq: Two times a day (BID) | ORAL | 1 refills | Status: DC | PRN

## 2016-12-06 NOTE — Patient Instructions (Signed)
Gastritis en los adultos (Gastritis, Adult) La gastritis es la irritacin del estmago. Hay dos tipos de gastritis:  Gastritis aguda. Este tipo aparece de manera repentina.  Gastritis crnica. Este tipo dura mucho tiempo. La gastritis aparece cuando la membrana que recubre el estmago se debilita o se daa. Sin tratamiento, la gastritis puede causar hemorragias y lceras estomacales. CAUSAS Esta afeccin puede ser causada por lo siguiente:  Una infeccin.  Beber alcohol en exceso.  Ciertos medicamentos.  Tener demasiada cantidad de cido en el estmago.  Una enfermedad de los intestinos o del estmago.  Estrs. SNTOMAS Los sntomas de esta afeccin incluyen lo siguiente:  Dolor o ardor en la parte superior del abdomen.  Nuseas.  Vmitos.  Sensacin molesta de distensin despus de comer. En algunos casos no hay sntomas. DIAGNSTICO Esta afeccin se puede diagnosticar a travs de lo siguiente:  Una descripcin de los sntomas.  Un examen fsico.  Estudios. Estos pueden incluir los siguientes: ? Anlisis de sangre. ? Pruebas de materia fecal. ? Una prueba en la que se introduce un instrumento delgado y flexible que tiene una luz y una cmara en la punta a travs del esfago y hacia el estmago (endoscopia superior). ? Una prueba en la que se toma una muestra de tejido para analizarlo (biopsia). TRATAMIENTO Esta afeccin puede tratarse con medicamentos. Si la afeccin es causada por una infeccin bacteriana, pueden darle antibiticos. Si es causada por demasiada cantidad de cido en el estmago, pueden darle medicamentos llamados bloqueadores H2, inhibidores de la bomba de protones o anticidos. El tratamiento tambin puede incluir la suspensin del uso de ciertos medicamentos, como la aspirina, el ibuprofeno u otros antiinflamatorios no esteroides (AINE). INSTRUCCIONES PARA EL CUIDADO EN EL HOGAR  Tome los medicamentos de venta libre y los recetados solamente como se  lo haya indicado el mdico.  Si le recetaron un antibitico, tmelo como se lo haya indicado el mdico. No deje de tomar los antibiticos aunque comience a sentirse mejor.  Beba suficiente lquido para mantener la orina clara o de color amarillo plido.  Haga varias comidas pequeas y frecuentes durante el da en lugar de comidas abundantes. SOLICITE ATENCIN MDICA SI:  Los sntomas empeoran.  Los sntomas regresan despus del tratamiento. SOLICITE ATENCIN MDICA DE INMEDIATO SI:  Vomita sangre de color rojo brillante o una sustancia similar a los granos de caf.  La materia fecal es negra o de color rojo oscuro.  No puede retener los lquidos.  El dolor abdominal empeora.  Tiene fiebre.  No mejora luego de 1 semana. Esta informacin no tiene como fin reemplazar el consejo del mdico. Asegrese de hacerle al mdico cualquier pregunta que tenga. Document Released: 02/17/2005 Document Revised: 01/29/2015 Document Reviewed: 02/01/2015 Elsevier Interactive Patient Education  2018 Elsevier Inc.  

## 2016-12-06 NOTE — Progress Notes (Signed)
Subjective:  Patient ID: Crystal [name], female    DOB: Sep 21, 1970  Age: 46 y.o. MRN: [id number]  CC:   HPI Crystal [name] is a 46 year old woman with a history of GERD, Stage 1B cervical cancer status post robotic-assisted type III radical laparoscopic hysterectomy with bilateral salpingectomy and bilateral sentinel lymph node biopsy in 06/2015 (no residual carcinoma in hysterectomy specimen, no adjuvant therapy as per GYN due to low risk features) who presents to establish care with me. She was previously followed by her PCP-Dr. Janne Napoleon who is no longer with the practice.  She presents today complaining of epigastric pain which is worse at night and waking up with blood in her mouth; symptoms occur at night and she has associated reflux. She spits out up to a teaspoon of bright red blood. Denies dizziness Taking omeprazole with no relief in symptoms. She was seen by ENT in 09/2016 as per PCP notes and laryngoscopy revealed severe posterior laryngeal edema consistent with laryngopharyngeal reflux, no suspicious lesions noted, no obvious source of bleeding.  Also complains of intermittent neck pain which is was on turning her neck; denies history of trauma.  Past Medical History:  Diagnosis Date  . Cancer ([place])    cervical cancer dx.   . History of kidney stones    x1   . Kidney stones    cyst on kidney -no problems  . 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 PONV (postoperative nausea and vomiting)     Past Surgical History:  Procedure Laterality Date  . BREAST BIOPSY Left 08/28/2015   MRI- High Risk  . BREAST BIOPSY Right 08/28/2015   MRI- Benign  . BREAST EXCISIONAL BIOPSY Left 10/03/2015  . BREAST LUMPECTOMY WITH RADIOACTIVE SEED LOCALIZATION Left 10/03/2015   Procedure: LEFT BREAST LUMPECTOMY WITH RADIOACTIVE SEED LOCALIZATION;  Surgeon: Fanny Skates, MD;  Location: [facility];  Service: General;  Laterality: Left;  .  CERVICAL BIOPSY  W/ LOOP ELECTRODE EXCISION     12'16  . CESAREAN SECTION     2 previous  . ROBOTIC ASSISTED TOTAL HYSTERECTOMY Bilateral 07/01/2015   Procedure: XI ROBOTIC ASSISTED RADICAL TYPE II HYSTERECTOMY, BILATERAL SALPINGECTOMY, SENTINAL LYMPH NODE BIOPSY;  Surgeon: Everitt Amber, MD;  Location: [facility];  Service: Gynecology;  Laterality: Bilateral;    No Known Allergies   Outpatient Medications Prior to Visit  Medication Sig Dispense Refill  . diclofenac sodium (VOLTAREN) 1 % GEL Apply 2 g topically 4 (four) times daily. (Patient not taking: Reported on 05/14/2016) 100 g 2  . famotidine (PEPCID) 20 MG tablet Take 1 tablet (20 mg total) by mouth 2 (two) times daily. (Patient not taking: Reported on 05/14/2016) 60 tablet 1  . amitriptyline (ELAVIL) 50 MG tablet Take 1 tablet (50 mg total) by mouth at bedtime as needed for sleep. (Patient not taking: Reported on 12/06/2016) 30 tablet 3  . fexofenadine (ALLEGRA ALLERGY) 180 MG tablet Take 1 tablet (180 mg total) by mouth daily. (Patient not taking: Reported on 12/06/2016) 30 tablet 3  . meloxicam (MOBIC) 15 MG tablet Take 1 tablet (15 mg total) by mouth daily. Take w/ food (Patient not taking: Reported on 05/14/2016) 30 tablet 0  . naproxen (NAPROSYN) 500 MG tablet Take 1 tablet (500 mg total) by mouth 2 (two) times daily with a meal. (Patient not taking: Reported on 12/06/2016) 30 tablet 0  . pantoprazole (PROTONIX) 20 MG tablet Take 1 tablet (20 mg total) by mouth daily. (Patient not  taking: Reported on 09/03/2016) 30 tablet 0  . senna (SENOKOT) 8.6 MG TABS tablet Take 1 tablet (8.6 mg total) by mouth daily as needed for mild constipation. (Patient not taking: Reported on 09/03/2016) 120 each 2  . valACYclovir (VALTREX) 1000 MG tablet Take 1 tablet (1,000 mg total) by mouth 3 (three) times daily. For shingles (Patient not taking: Reported on 05/14/2016) 21 tablet 0  . Vitamin D, Ergocalciferol, (DRISDOL) 50000 units CAPS capsule Take 1 capsule  (50,000 Units total) by mouth every 7 (seven) days. (Patient not taking: Reported on 12/06/2016) 12 capsule 0   No facility-administered medications prior to visit.     ROS Review of Systems  Constitutional: Negative for activity change, appetite change and fatigue.  HENT: Negative for congestion, sinus pressure and sore throat.   Eyes: Negative for visual disturbance.  Respiratory: Negative for cough, chest tightness, shortness of breath and wheezing.   Cardiovascular: Negative for chest pain and palpitations.  Gastrointestinal: Positive for abdominal pain. Negative for abdominal distention, blood in stool, constipation, diarrhea, nausea and vomiting.  Endocrine: Negative for polydipsia.  Genitourinary: Negative for dysuria and frequency.  Musculoskeletal: Positive for neck pain. Negative for arthralgias and back pain.  Skin: Negative for rash.  Neurological: Negative for tremors, light-headedness and numbness.  Hematological: Does not bruise/bleed easily.  Psychiatric/Behavioral: Negative for agitation and behavioral problems.    Objective:  BP 123/78 (BP Location: Right Arm, Patient Position: Sitting, Cuff Size: Normal)   Pulse 84   Temp 98.1 F (36.7 C)   Resp 16   Wt 115 lb (52.2 kg)   LMP 06/07/2015 (Exact Date)   SpO2 97%   BMI 25.78 kg/m   BP/Weight 12/06/2016 09/28/2016 0/92/3300  Systolic BP 762 263 335  Diastolic BP 78 63 61  Wt. (Lbs) 115 113 110.5  BMI 25.78 25.33 21.58      Physical Exam  Constitutional: She is oriented to person, place, and time. She appears well-developed and well-nourished.  Cardiovascular: Normal rate, normal heart sounds and intact distal pulses.   No murmur heard. Pulmonary/Chest: Effort normal and breath sounds normal. She has no wheezes. She has no rales. She exhibits no tenderness.  Abdominal: Soft. Bowel sounds are normal. She exhibits no distension and no mass. There is tenderness (Epigastric tenderness).  Musculoskeletal: Normal  range of motion. She exhibits tenderness (slight tenderness on palpation of bilateral sternocleidomastoid muscles).  Neurological: She is alert and oriented to person, place, and time.    CBC    Component Value Date/Time   WBC 8.3 08/30/2016 1709   WBC 7.9 05/21/2016 1600   RBC 4.40 08/30/2016 1709   RBC 4.39 05/21/2016 1600   HGB 14.3 08/30/2016 1709   HGB 15.0 07/08/2015 1441   HCT 42.2 08/30/2016 1709   HCT 45.5 07/08/2015 1441   PLT 260 08/30/2016 1709   MCV 96 08/30/2016 1709   MCV 96.1 07/08/2015 1441   MCH 32.5 08/30/2016 1709   MCH 33.0 05/21/2016 1600   MCHC 33.9 08/30/2016 1709   MCHC 35.0 05/21/2016 1600   RDW 12.7 08/30/2016 1709   RDW 12.4 07/08/2015 1441   LYMPHSABS 2.4 08/30/2016 1709   LYMPHSABS 1.1 07/08/2015 1441   MONOABS 486 04/07/2016 1202   MONOABS 0.6 07/08/2015 1441   EOSABS 0.2 08/30/2016 1709   BASOSABS 0.0 08/30/2016 1709   BASOSABS 0.1 07/08/2015 1441    Assessment & Plan:   1. Other chronic gastritis with hemorrhage We will need to screen for Helicobacter pylori gastritis Hold  off on omeprazole for Helicobacter pylori breath test at next visit in 2 weeks If H. pylori test is negative she will need upper endoscopy Avoid NSAIDs - CBC with Differential/Platelet  2. Neck pain Apply heat - methocarbamol (ROBAXIN-750) 750 MG tablet; Take 1 tablet (750 mg total) by mouth 2 (two) times daily as needed for muscle spasms.  Dispense: 60 tablet; Refill: 1  3. History of cervical cancer Status post hysterectomy,bilateral salpingo-oophorectomy and lymph node biopsy  Meds ordered this encounter  Medications  . methocarbamol (ROBAXIN-750) 750 MG tablet    Sig: Take 1 tablet (750 mg total) by mouth 2 (two) times daily as needed for muscle spasms.    Dispense:  60 tablet    Refill:  1    Follow-up: Return in about 2 weeks (around 12/20/2016) for Follow-up on gastritis.   This note has been created with Human resources officer. Any transcriptional errors are unintentional.     Arnoldo Morale MD

## 2016-12-07 LAB — CBC WITH DIFFERENTIAL/PLATELET
BASOS ABS: 0 10*3/uL (ref 0.0–0.2)
Basos: 0 %
EOS (ABSOLUTE): 0.2 10*3/uL (ref 0.0–0.4)
Eos: 2 %
Hematocrit: 41.2 % (ref 34.0–46.6)
Hemoglobin: 13.9 g/dL (ref 11.1–15.9)
Immature Grans (Abs): 0 10*3/uL (ref 0.0–0.1)
Immature Granulocytes: 0 %
LYMPHS ABS: 1.9 10*3/uL (ref 0.7–3.1)
Lymphs: 24 %
MCH: 32.7 pg (ref 26.6–33.0)
MCHC: 33.7 g/dL (ref 31.5–35.7)
MCV: 97 fL (ref 79–97)
MONOS ABS: 0.4 10*3/uL (ref 0.1–0.9)
Monocytes: 6 %
Neutrophils Absolute: 5.2 10*3/uL (ref 1.4–7.0)
Neutrophils: 68 %
PLATELETS: 253 10*3/uL (ref 150–379)
RBC: 4.25 x10E6/uL (ref 3.77–5.28)
RDW: 12.6 % (ref 12.3–15.4)
WBC: 7.7 10*3/uL (ref 3.4–10.8)

## 2016-12-28 ENCOUNTER — Telehealth: Payer: Self-pay | Admitting: *Deleted

## 2016-12-28 NOTE — Telephone Encounter (Signed)
Attempted to return patients call, no answer. Unable to leave message.

## 2016-12-30 ENCOUNTER — Telehealth: Payer: Self-pay | Admitting: *Deleted

## 2016-12-30 NOTE — Telephone Encounter (Signed)
Returned patient's call and scheduled follow up appt for October 12th at 2:30pm; arrive at 2pm

## 2016-12-31 ENCOUNTER — Ambulatory Visit: Payer: Self-pay | Attending: Family Medicine | Admitting: Family Medicine

## 2016-12-31 ENCOUNTER — Encounter: Payer: Self-pay | Admitting: Family Medicine

## 2016-12-31 VITALS — BP 105/72 | HR 83 | Temp 97.9°F | Wt 114.8 lb

## 2016-12-31 DIAGNOSIS — R109 Unspecified abdominal pain: Secondary | ICD-10-CM | POA: Insufficient documentation

## 2016-12-31 DIAGNOSIS — K219 Gastro-esophageal reflux disease without esophagitis: Secondary | ICD-10-CM | POA: Insufficient documentation

## 2016-12-31 DIAGNOSIS — Z86000 Personal history of in-situ neoplasm of breast: Secondary | ICD-10-CM | POA: Insufficient documentation

## 2016-12-31 DIAGNOSIS — K5909 Other constipation: Secondary | ICD-10-CM | POA: Insufficient documentation

## 2016-12-31 DIAGNOSIS — Z87442 Personal history of urinary calculi: Secondary | ICD-10-CM | POA: Insufficient documentation

## 2016-12-31 DIAGNOSIS — Z9079 Acquired absence of other genital organ(s): Secondary | ICD-10-CM | POA: Insufficient documentation

## 2016-12-31 DIAGNOSIS — Z8541 Personal history of malignant neoplasm of cervix uteri: Secondary | ICD-10-CM | POA: Insufficient documentation

## 2016-12-31 DIAGNOSIS — Z90710 Acquired absence of both cervix and uterus: Secondary | ICD-10-CM | POA: Insufficient documentation

## 2016-12-31 DIAGNOSIS — K2951 Unspecified chronic gastritis with bleeding: Secondary | ICD-10-CM | POA: Insufficient documentation

## 2016-12-31 LAB — POCT URINALYSIS DIPSTICK
Bilirubin, UA: NEGATIVE
Blood, UA: NEGATIVE
Glucose, UA: NEGATIVE
Ketones, UA: NEGATIVE
LEUKOCYTES UA: NEGATIVE
NITRITE UA: NEGATIVE
PH UA: 7.5 (ref 5.0–8.0)
PROTEIN UA: NEGATIVE
Spec Grav, UA: 1.01 (ref 1.010–1.025)
UROBILINOGEN UA: 0.2 U/dL

## 2016-12-31 MED ORDER — POLYETHYLENE GLYCOL 3350 17 GM/SCOOP PO POWD: 17.0000 g | Freq: Every day | ORAL | 1 refills | Status: DC

## 2016-12-31 MED FILL — METHOCARBAMOL 750 MG TABLET: 750 | 30 days supply | Qty: 60 | Fill #0

## 2016-12-31 MED FILL — POLYETHYLENE GLYCOL 3350: 15 days supply | Qty: 255 | Fill #0

## 2016-12-31 NOTE — Progress Notes (Signed)
Subjective:  Patient ID: Crystal [name], female    DOB: Oct 29, 1970  Age: 46 y.o. MRN: [id number]  CC: Follow-up   HPI Crystal [name] is a 46 year old woman with a history of GERD, Stage 1B cervical cancer status post robotic-assisted type III radical laparoscopic hysterectomy with bilateral salpingectomy and bilateral sentinel lymph node biopsy in 06/2015 (no residual carcinoma in hysterectomy specimen, no adjuvant therapy as per GYN due to low risk features) here for follow-up of epigastric pain and hematemesis.  At her last visit I had advised her to hold off on omeprazole for 2 weeks so H. pylori breath test could be performed today. She had complained of epigastric pain, worse at night and waking up with blood in her mouth,associated reflux. She spits out up to a teaspoon of bright red blood in symptoms has worsened ever since omeprazole has been on hold.  Denies dizziness Of note at her last visit she had stated symptoms are not controlled on omeprazole.  She was seen by ENT in 09/2016 as per PCP notes and laryngoscopy revealed severe posterior laryngeal edema consistent with laryngopharyngeal reflux, no suspicious lesions noted, no obvious source of bleeding.  Also complains of left flank pain but denies dysuria or hematuria.   Past Medical History:  Diagnosis Date  . Cancer ([place])    cervical cancer dx.   . History of kidney stones    x1   . Kidney stones    cyst on kidney -no problems  . 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 PONV (postoperative nausea and vomiting)     Past Surgical History:  Procedure Laterality Date  . BREAST BIOPSY Left 08/28/2015   MRI- High Risk  . BREAST BIOPSY Right 08/28/2015   MRI- Benign  . BREAST EXCISIONAL BIOPSY Left 10/03/2015  . BREAST LUMPECTOMY WITH RADIOACTIVE SEED LOCALIZATION Left 10/03/2015   Procedure: LEFT BREAST LUMPECTOMY WITH RADIOACTIVE SEED LOCALIZATION;  Surgeon: Fanny Skates, MD;   Location: [facility];  Service: General;  Laterality: Left;  . CERVICAL BIOPSY  W/ LOOP ELECTRODE EXCISION     12'16  . CESAREAN SECTION     2 previous  . ROBOTIC ASSISTED TOTAL HYSTERECTOMY Bilateral 07/01/2015   Procedure: XI ROBOTIC ASSISTED RADICAL TYPE II HYSTERECTOMY, BILATERAL SALPINGECTOMY, SENTINAL LYMPH NODE BIOPSY;  Surgeon: Everitt Amber, MD;  Location: [facility];  Service: Gynecology;  Laterality: Bilateral;    No Known Allergies   Outpatient Medications Prior to Visit  Medication Sig Dispense Refill  . diclofenac sodium (VOLTAREN) 1 % GEL Apply 2 g topically 4 (four) times daily. (Patient not taking: Reported on 05/14/2016) 100 g 2  . famotidine (PEPCID) 20 MG tablet Take 1 tablet (20 mg total) by mouth 2 (two) times daily. (Patient not taking: Reported on 05/14/2016) 60 tablet 1  . methocarbamol (ROBAXIN-750) 750 MG tablet Take 1 tablet (750 mg total) by mouth 2 (two) times daily as needed for muscle spasms. (Patient not taking: Reported on 12/31/2016) 60 tablet 1  . omeprazole (PRILOSEC) 10 MG capsule Take 10 mg by mouth daily.     No facility-administered medications prior to visit.     ROS Review of Systems  Constitutional: Negative for activity change, appetite change and fatigue.  HENT: Negative for congestion, sinus pressure and sore throat.   Eyes: Negative for visual disturbance.  Respiratory: Negative for cough, chest tightness, shortness of breath and wheezing.   Cardiovascular: Negative for chest pain and palpitations.  Gastrointestinal: Positive for  abdominal pain and constipation. Negative for abdominal distention.  Endocrine: Negative for polydipsia.  Genitourinary: Positive for flank pain. Negative for dysuria and frequency.  Musculoskeletal: Negative for arthralgias and back pain.  Skin: Negative for rash.  Neurological: Negative for tremors, light-headedness and numbness.  Hematological: Does not bruise/bleed easily.  Psychiatric/Behavioral:  Negative for agitation and behavioral problems.    Objective:  BP 105/72   Pulse 83   Temp 97.9 F (36.6 C) (Oral)   Wt 114 lb 12.8 oz (52.1 kg)   LMP 06/07/2015 (Exact Date)   SpO2 97%   BMI 25.74 kg/m   BP/Weight 12/31/2016 01/23/1114 09/22/800  Systolic BP 233 612 244  Diastolic BP 72 78 63  Wt. (Lbs) 114.8 115 113  BMI 25.74 25.78 25.33      Physical Exam Constitutional: She is oriented to person, place, and time. She appears well-developed and well-nourished.  Cardiovascular: Normal rate, normal heart sounds and intact distal pulses.   No murmur heard. Pulmonary/Chest: Effort normal and breath sounds normal. She has no wheezes. She has no rales. She exhibits no tenderness.  Abdominal: Soft. Bowel sounds are normal. She exhibits no distension and no mass. There is tenderness (Epigastric tenderness).  Musculoskeletal: Normal range of motion. Left CVA tenderness  Neurological: She is alert and oriented to person, place, and time.    Assessment & Plan:   1. Other chronic gastritis with hemorrhage Advised to resume omeprazole We'll get back to patient with results of H. pylori breath test If test is negative and she is symptomatic will refer to GI - H. pylori breath test  2. Other constipation Placed on MiraLAX Increase fiber intake  3. Flank pain History of kidney stones We'll evaluate for UTI - UA negative for UTI or hematuria.   Meds ordered this encounter  Medications  . polyethylene glycol powder (GLYCOLAX/MIRALAX) powder    Sig: Take 17 g by mouth daily.    Dispense:  3350 g    Refill:  1    Follow-up: Return in about 6 weeks (around 02/11/2017) for Follow-up on gastritis.   Arnoldo Morale MD

## 2016-12-31 NOTE — Patient Instructions (Addendum)
Gastritis en los adultos (Gastritis, Adult) La gastritis es la irritacin del estmago. Hay dos tipos de gastritis:  Gastritis aguda. Este tipo aparece de manera repentina.  Gastritis crnica. Este tipo dura mucho tiempo. La gastritis aparece cuando la membrana que recubre el estmago se debilita o se daa. Sin tratamiento, la gastritis puede causar hemorragias y lceras estomacales. CAUSAS Esta afeccin puede ser causada por lo siguiente:  Una infeccin.  Beber alcohol en exceso.  Ciertos medicamentos.  Tener demasiada cantidad de cido en el estmago.  Una enfermedad de los intestinos o del estmago.  Estrs. SNTOMAS Los sntomas de esta afeccin incluyen lo siguiente:  Dolor o ardor en la parte superior del abdomen.  Nuseas.  Vmitos.  Sensacin molesta de distensin despus de comer. En algunos casos no hay sntomas. DIAGNSTICO Esta afeccin se puede diagnosticar a travs de lo siguiente:  Una descripcin de los sntomas.  Un examen fsico.  Estudios. Estos pueden incluir los siguientes: ? Anlisis de sangre. ? Pruebas de materia fecal. ? Una prueba en la que se introduce un instrumento delgado y flexible que tiene una luz y una cmara en la punta a travs del esfago y hacia el estmago (endoscopia superior). ? Una prueba en la que se toma una muestra de tejido para analizarlo (biopsia). TRATAMIENTO Esta afeccin puede tratarse con medicamentos. Si la afeccin es causada por una infeccin bacteriana, pueden darle antibiticos. Si es causada por demasiada cantidad de cido en el estmago, pueden darle medicamentos llamados bloqueadores H2, inhibidores de la bomba de protones o anticidos. El tratamiento tambin puede incluir la suspensin del uso de ciertos medicamentos, como la aspirina, el ibuprofeno u otros antiinflamatorios no esteroides (AINE). INSTRUCCIONES PARA EL CUIDADO EN EL HOGAR  Tome los medicamentos de venta libre y los recetados solamente como se  lo haya indicado el mdico.  Si le recetaron un antibitico, tmelo como se lo haya indicado el mdico. No deje de tomar los antibiticos aunque comience a sentirse mejor.  Beba suficiente lquido para mantener la orina clara o de color amarillo plido.  Haga varias comidas pequeas y frecuentes durante el da en lugar de comidas abundantes. SOLICITE ATENCIN MDICA SI:  Los sntomas empeoran.  Los sntomas regresan despus del tratamiento. SOLICITE ATENCIN MDICA DE INMEDIATO SI:  Vomita sangre de color rojo brillante o una sustancia similar a los granos de caf.  La materia fecal es negra o de color rojo oscuro.  No puede retener los lquidos.  El dolor abdominal empeora.  Tiene fiebre.  No mejora luego de 1 semana. Esta informacin no tiene como fin reemplazar el consejo del mdico. Asegrese de hacerle al mdico cualquier pregunta que tenga. Document Released: 02/17/2005 Document Revised: 01/29/2015 Document Reviewed: 02/01/2015 Elsevier Interactive Patient Education  2018 [employer]vier Inc.  

## 2017-01-03 LAB — H. PYLORI BREATH TEST: H pylori Breath Test: NEGATIVE

## 2017-01-05 ENCOUNTER — Telehealth: Payer: Self-pay

## 2017-01-05 NOTE — Telephone Encounter (Signed)
-----   Message from Arnoldo Morale, MD sent at 01/03/2017 12:24 PM EDT ----- H. pylori breath test is negative.

## 2017-01-05 NOTE — Telephone Encounter (Signed)
CMA call regarding lab results   Patient verify DOB  Patient was aware and understood  

## 2017-01-26 ENCOUNTER — Encounter: Payer: Self-pay | Admitting: Physician Assistant

## 2017-01-26 ENCOUNTER — Ambulatory Visit: Payer: Self-pay | Attending: Family Medicine | Admitting: Physician Assistant

## 2017-01-26 VITALS — BP 122/74 | HR 75 | Temp 98.0°F | Resp 18 | Ht 59.0 in | Wt 115.8 lb

## 2017-01-26 DIAGNOSIS — F419 Anxiety disorder, unspecified: Secondary | ICD-10-CM | POA: Insufficient documentation

## 2017-01-26 DIAGNOSIS — M62838 Other muscle spasm: Secondary | ICD-10-CM | POA: Insufficient documentation

## 2017-01-26 DIAGNOSIS — R451 Restlessness and agitation: Secondary | ICD-10-CM | POA: Insufficient documentation

## 2017-01-26 DIAGNOSIS — Q383 Other congenital malformations of tongue: Secondary | ICD-10-CM

## 2017-01-26 DIAGNOSIS — L298 Other pruritus: Secondary | ICD-10-CM | POA: Insufficient documentation

## 2017-01-26 MED ORDER — CYCLOBENZAPRINE HCL 10 MG PO TABS: 10.0000 mg | ORAL_TABLET | Freq: Every day | ORAL | 0 refills | Status: DC

## 2017-01-26 MED ORDER — SERTRALINE HCL 50 MG PO TABS: ORAL_TABLET | ORAL | 3 refills | Status: DC

## 2017-01-26 MED FILL — ?SERTRALINE HCL 50 MG TAB: 50 | 30 days supply | Qty: 30 | Fill #0

## 2017-01-26 MED FILL — ?CYCLOBENZAPRINE 10 MG TABL: 10 | 30 days supply | Qty: 30 | Fill #0

## 2017-01-26 NOTE — Progress Notes (Signed)
Patient ID: Crystal [name], female   DOB: November 02, 1970, 46 y.o.   MRN: [id number]    Crystal [name], is a 46 y.o. female  [id number]:[id number]  [id number]  DOB - June 25, 1970  Subjective:  Chief Complaint and HPI: Crystal [name] is a 46 y.o. female here today for multiple issues: [name] from [place] interpreters is used: 1.  Pain for 2 weeks in lower head into neck and into lower back into her throat and into her arms B and around to her chest.  No f/c.  No numbness/weakness.  No new activities.  No known injury.  This is a recurring problem.  Xrays have been ordered in the past, but she did not go.  She is concerned that something serious is causing this.  No SOB.  No CP. (the pain is around her lower rib cage B)  2.  Itching tongue X 2 months.  "white" on her tongue for about 1 month.  and Thicker mucus since having her wisdom teeth removed about 6 months ago.  She brushes her teeth and tongue rigorously to the point that it bleeds.  She sometimes uses a spoon to get at the white spots in the back of her mouth.   Bleeding gums at night for ~2 years-CBC has been normal  3.  Feels tired and agitated a lot.  She has never been on anti-anxiety medications.  Denies SI/HI. She is open to taking medications.  Admits to excessive worry and concern in general and specifically about her health.     ROS:   Constitutional:  No f/c, No night sweats, No unexplained weight loss. EENT:  No vision changes, No blurry vision, No hearing changes. No other mouth, throat, or ear problems.  Respiratory: No cough, No SOB Cardiac: No CP, no palpitations GI:  No abd pain, No N/V/D. GU: No Urinary s/sx Musculoskeletal: + generalized muscle aches and pains in upper body Neuro: No headache, no dizziness, no motor weakness.  Skin: No rash Endocrine:  No polydipsia. No polyuria.  Psych: Denies SI/HI  No problems updated.  ALLERGIES: No Known Allergies  PAST MEDICAL HISTORY: Past Medical  History:  Diagnosis Date  . Cancer ([place])    cervical cancer dx.   . History of kidney stones    x1   . Kidney stones    cyst on kidney -no problems  . 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 PONV (postoperative nausea and vomiting)     MEDICATIONS AT HOME: Prior to Admission medications   Medication Sig Start Date End Date Taking? Authorizing Provider  diclofenac sodium (VOLTAREN) 1 % GEL Apply 2 g topically 4 (four) times daily. 04/07/16  Yes Langeland, Dawn T, MD  cyclobenzaprine (FLEXERIL) 10 MG tablet Take 1 tablet (10 mg total) by mouth at bedtime. Prn muscle pain 01/26/17   Argentina Donovan, PA-C  famotidine (PEPCID) 20 MG tablet Take 1 tablet (20 mg total) by mouth 2 (two) times daily. Patient not taking: Reported on 05/14/2016 02/03/16 02/02/17  Maren Reamer, MD  omeprazole (PRILOSEC) 10 MG capsule Take 10 mg by mouth daily.    [provider]  polyethylene glycol powder (GLYCOLAX/MIRALAX) powder Take 17 g by mouth daily. 12/31/16   Arnoldo Morale, MD  sertraline (ZOLOFT) 50 MG tablet Take 1/2 tablet daily for 1 week then 1 tablet daily 01/26/17   Argentina Donovan, PA-C     Objective:  EXAM:   Vitals:   01/26/17 1549  BP: 122/74  Pulse: 75  Resp: 18  Temp: 98 F (36.7 C)  TempSrc: Oral  SpO2: 99%  Weight: 115 lb 12.8 oz (52.5 kg)  Height: 4\' 11"  (1.499 m)    General appearance : A&OX3. NAD. Non-toxic-appearing HEENT: Atraumatic and Normocephalic.  PERRLA. EOM intact.  TM clear B. Mouth-MMM, post pharynx WNL w/o erythema, No PND.  Tongue appears normal today.  There are no tonsilliths.  No visible abnormality of the mouth/oropharynx that I am able to visulaize Neck: supple, no JVD. No cervical lymphadenopathy. No thyromegaly Chest/Lungs:  Breathing-non-labored, Good air entry bilaterally, breath sounds normal without rales, rhonchi, or wheezing  CVS: S1 S2 regular, no murmurs, gallops, rubs  Extremities: Bilateral Lower Ext shows no edema,  both legs are warm to touch with = pulse throughout.  DTR=2+ throughout B.  Strength and ROM B U/LE.   Back-full S&ROM.  There is paraspinus and trapezius spasm and TTP B.  Neg SLR Neurology:  CN II-XII grossly intact, Non focal.   Psych:  TP linear. J/I WNL. Normal speech. Appropriate eye contact and affect.  Skin:  No Rash  Data Review Lab Results  Component Value Date   HGBA1C 5.5 04/04/2015     Assessment & Plan   1. Muscle spasm methocarbamol not working, so she can stop it. Take ibuprofen - cyclobenzaprine (FLEXERIL) 10 MG tablet; Take 1 tablet (10 mg total) by mouth at bedtime. Prn muscle pain  Dispense: 30 tablet; Refill: 0  2. Tongue abnormality I do not see any abnormality today.  Patient is insistent to have further work-up.  I have advised her not to brush her tongue and teeth so rigorously and not to use a spoon to dig things out of her throat - Ambulatory referral to ENT  3. Anxiety-?underlying disorder I have provided reassurance and counseling on self-care. - sertraline (ZOLOFT) 50 MG tablet; Take 1/2 tablet daily for 1 week then 1 tablet daily  Dispense: 30 tablet; Refill: 3  Patient have been counseled extensively about nutrition and exercise  Return in about 3 weeks (around 02/16/2017) for with me or Dr. Jarold Song for anxiety/back pain.  The patient was given clear instructions to go to ER or return to medical center if symptoms don't improve, worsen or new problems develop. The patient verbalized understanding. The patient was told to call to get lab results if they haven't heard anything in the next week.     Freeman Caldron, PA-C [facility] and [facility], [place]   01/26/2017, 4:09 PM

## 2017-02-03 ENCOUNTER — Ambulatory Visit (INDEPENDENT_AMBULATORY_CARE_PROVIDER_SITE_OTHER): Payer: Self-pay | Admitting: Otolaryngology

## 2017-02-03 DIAGNOSIS — K219 Gastro-esophageal reflux disease without esophagitis: Secondary | ICD-10-CM

## 2017-02-03 DIAGNOSIS — R070 Pain in throat: Secondary | ICD-10-CM

## 2017-02-15 ENCOUNTER — Ambulatory Visit: Payer: Self-pay | Admitting: Family Medicine

## 2017-03-01 ENCOUNTER — Encounter (HOSPITAL_COMMUNITY): Payer: Self-pay

## 2017-03-04 ENCOUNTER — Encounter: Payer: Self-pay | Admitting: Gynecologic Oncology

## 2017-03-04 ENCOUNTER — Ambulatory Visit: Payer: Self-pay | Attending: Gynecologic Oncology | Admitting: Gynecologic Oncology

## 2017-03-04 ENCOUNTER — Ambulatory Visit (HOSPITAL_COMMUNITY)
Admission: RE | Admit: 2017-03-04 | Discharge: 2017-03-04 | Disposition: A | Discharge status: Home / Self Care | Payer: Self-pay | Source: Ambulatory Visit | Attending: Gynecologic Oncology | Admitting: Gynecologic Oncology

## 2017-03-04 VITALS — BP 110/60 | HR 74 | Temp 98.2°F | Resp 18 | Ht 58.82 in | Wt 115.7 lb

## 2017-03-04 DIAGNOSIS — K2951 Unspecified chronic gastritis with bleeding: Secondary | ICD-10-CM

## 2017-03-04 DIAGNOSIS — C539 Malignant neoplasm of cervix uteri, unspecified: Secondary | ICD-10-CM | POA: Insufficient documentation

## 2017-03-04 DIAGNOSIS — R042 Hemoptysis: Secondary | ICD-10-CM

## 2017-03-04 DIAGNOSIS — Z90710 Acquired absence of both cervix and uterus: Secondary | ICD-10-CM | POA: Insufficient documentation

## 2017-03-04 DIAGNOSIS — Z79899 Other long term (current) drug therapy: Secondary | ICD-10-CM | POA: Insufficient documentation

## 2017-03-04 DIAGNOSIS — Z853 Personal history of malignant neoplasm of breast: Secondary | ICD-10-CM | POA: Insufficient documentation

## 2017-03-04 DIAGNOSIS — Z8541 Personal history of malignant neoplasm of cervix uteri: Secondary | ICD-10-CM

## 2017-03-04 DIAGNOSIS — Z90722 Acquired absence of ovaries, bilateral: Secondary | ICD-10-CM | POA: Insufficient documentation

## 2017-03-04 DIAGNOSIS — N870 Mild cervical dysplasia: Secondary | ICD-10-CM | POA: Insufficient documentation

## 2017-03-04 NOTE — Patient Instructions (Signed)
We will obtain a chest x ray today. We will send you to see the gastrointestinal doctors at [facility] to evaluate you for intestinal sources of your spitting up blood.  Dr Denman George will see you again in March, 2019  Please notify Dr Denman George at phone number [phone] if you notice vaginal bleeding, new pelvic or abdominal pains, bloating, feeling full easy, or a change in bladder or bowel function.

## 2017-03-04 NOTE — Progress Notes (Signed)
Follow Up Note: Gyn-Onc  Crystal [name] 46 y.o. female  CC:  Chief Complaint  Patient presents with  . Malignant neoplasm of cervix, unspecified site [facility])   Assessment/Plan:  46 year old s/p robotic-assisted type III radical laparoscopic hysterectomy with bilateral salpingectomy and bilateral sentinel lymph node biopsy on 07/01/15.  No residual carcinoma on hysterectomy specimen. Low risk features to her tumor therefore no adjuvant therapy recommended. No evidence for recurrence on today's exam.   1/ we will continue annual pap surveillance for this with hpv cotesting (next due in March, 2019).  2/ I discussed symptoms concerning for recurrence including vaginal bleeding or discharge, pelvic pain, cough, or lower extremity edema.  3/ Follow-up for well woman care with in March, 2019 and then 6 monthly after that.   4/ hemoptysis: unclear if GI or pulm - will order CXR.  5/ gastritis: referral to [facility]. She reports GI irritation since her surgery. We will have her be evaluated by GI for possible source. This symptom is not apparently related to her prior cancer diagnosis.  HPI: Crystal [name] is a 46 year old female initially referred by Dr. 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  She had her first abnormal Pap smear on 02/26/2015 which was HGSIL. She then underwent colposcopic evaluation of the cervix on 04/02/2015, which was benign in appearance. A biopsy from the ectocervix was benign and endocervical curetting showed CIN-3. As follow-up, she underwent a LEEP procedure on 05/12/2015. 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 Dimensions of the carcinoma were not included however there was a focus of dysplasia suspicious for invasive carcinoma involving the deep margin.  PET on 06/20/15: IMPRESSION: 1. No primary hypermetabolic primary cervical mass or evidence of  nodal metastasis. 2. Focal hypermetabolism in the lateral left breast. No CT correlate. Consider correlation with diagnostic mammography and ultrasound to exclude otherwise occult breast lesion. 3. Probable left nephrolithiasis  On 07/01/15, she underwent a robotic-assisted type III radical laparoscopic hysterectomy with bilateral salpingoophorectomy and bilateral pelvic lymphadenectomy by Dr. Denman George.  Final pathology revealed: Diagnosis 1. Uterus +/- tubes/ovaries, neoplastic, cervix CERVIX WITH LOW GRADE SQUAMOUS INTRAEPITHELIAL LESION, CIN-1 NO RESIDUAL HIGH GRADE INTRAEPITHELIAL LESION OR INVASIVE NEOPLASM IDENTIFIED VAGINAL CUFF, PARAMETRIAL AND ALL RESECTION MARGINS ARE NEGATIVE FOR TUMOR ENDOMETRIAL POLYP AND INACTIVE ENDOMETRIUM MYOMETRIUM: NO PATHOLOGICAL ALTERATIONS BILATERAL FALLOPIAN TUBES: HISTOLOGICAL UNREMARKABLE 2. Lymph node, sentinel, biopsy, right external iliac ONE BENIGN LYMPH NODE (0/1) 3. Lymph node, sentinel, biopsy, left obturator ONE BENIGN LYMPH NODE (0/1)  She was re-admitted to the hospital on POD 7, 07/08/15, for persistent nausea, vomiting, dehydration, no BM, dyspnea.  CT CAP on 07/08/15 failed to demonstrate evidence for intraperitoneal abscess/GU or GI injury or pathology including no obstruction. UA and culture revealed a catheter associated UTI (e coli - pan sensitive) which was treated with cipro. She was discharged home on 07/10/15 after IV hydration, aggressive bowel regimen.  She failed a voiding trial during her hospitalization and the foley had to be replaced due to urinary retention.  She presented to the office on 07/15/15 for a repeat voiding trial. She passed this well and her foley was left out, however she subsequently developed urinary retention and required teaching to intermittently self catheterize.  On 10/03/2015 she underwent a breast lumpectomy on the left which confirmed lobular hyperplasia but no carcinoma in situ identified. No additional  therapy is required for this.  Interval History:   She denies vaginal bleeding, LE edema.   She continues to report symptoms of spitting up blood intermittently (?pulm vs GI source). She has stomach irritation. She has had this symptom since her cervical cancer surgery. She has not received work up for this.  Review of Systems  Constitutional: Feels well.  No fever, chills, early satiety, or unintentional weight loss or gain.  Cardiovascular: No chest pain, shortness of breath, or edema.  Pulmonary: coughs up blood Gastrointestinal: No nausea, vomiting, or diarrhea. No bright red blood per rectum or change in bowel movement. + spits up blood intermittently Genitourinary: Minimal bladder sensation. No vaginal bleeding or discharge.  Musculoskeletal: No myalgia or joint pain. Neurologic: No weakness, numbness, or change in gait.  Psychology: No depression, anxiety, or insomnia.  Current Meds:  Outpatient Encounter Prescriptions as of 03/04/2017  Medication Sig  . Misc Natural Products [facility]) CAPS Take 1 tablet by mouth at bedtime.  . Probiotic Product (PROBIOTIC COLON SUPPORT PO) Take 1 tablet by mouth daily.  . [DISCONTINUED] cyclobenzaprine (FLEXERIL) 10 MG tablet Take 1 tablet (10 mg total) by mouth at bedtime. Prn muscle pain  . [DISCONTINUED] diclofenac sodium (VOLTAREN) 1 % GEL Apply 2 g topically 4 (four) times daily.  . [DISCONTINUED] famotidine (PEPCID) 20 MG tablet Take 1 tablet (20 mg total) by mouth 2 (two) times daily. (Patient not taking: Reported on 05/14/2016)  . [DISCONTINUED] omeprazole (PRILOSEC) 10 MG capsule Take 10 mg by mouth daily.  . [DISCONTINUED] polyethylene glycol powder (GLYCOLAX/MIRALAX) powder Take 17 g by mouth daily.  . [DISCONTINUED] sertraline (ZOLOFT) 50 MG tablet Take 1/2 tablet daily for 1 week then 1 tablet daily   No facility-administered encounter medications on file as of 03/04/2017.     Allergy: No Known Allergies  Social Hx:   Social  History   Social History  . Marital status: Widowed    Spouse name: N/A  . Number of children: N/A  . Years of education: N/A   Occupational History  . Not on file.   Social History Main Topics  . Smoking status: Never Smoker  . Smokeless tobacco: Never Used  . Alcohol use No     Comment: occasionally  . Drug use: No  . Sexual activity: Yes    Birth control/ protection: Surgical   Other Topics Concern  . Not on file   Social History Narrative  . No narrative on file    Past Surgical Hx:  Past Surgical History:  Procedure Laterality Date  . BREAST BIOPSY Left 08/28/2015   MRI- High Risk  . BREAST BIOPSY Right 08/28/2015   MRI- Benign  . BREAST EXCISIONAL BIOPSY Left 10/03/2015  . BREAST LUMPECTOMY WITH RADIOACTIVE SEED LOCALIZATION Left 10/03/2015   Procedure: LEFT BREAST LUMPECTOMY WITH RADIOACTIVE SEED LOCALIZATION;  Surgeon: Fanny Skates, MD;  Location: [facility];  Service: General;  Laterality: Left;  . CERVICAL BIOPSY  W/ LOOP ELECTRODE EXCISION     12'16  . CESAREAN SECTION     2 previous  . ROBOTIC ASSISTED TOTAL HYSTERECTOMY Bilateral 07/01/2015   Procedure: XI ROBOTIC ASSISTED RADICAL TYPE II HYSTERECTOMY, BILATERAL SALPINGECTOMY, SENTINAL LYMPH NODE BIOPSY;  Surgeon: Everitt Amber, MD;  Location: [facility];  Service: Gynecology;  Laterality: Bilateral;    Past Medical Hx:  Past Medical History:  Diagnosis Date  . Cancer ([place])    cervical cancer dx.   . History of kidney stones    x1   . Kidney stones  cyst on kidney -no problems  . 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 PONV (postoperative nausea and vomiting)     Family Hx:  Family History  Problem Relation Age of Onset  . Hypertension Sister     Vitals:  Blood pressure 110/60, pulse 74, temperature 98.2 F (36.8 C), temperature source Oral, resp. rate 18, height 4' 10.82" (1.494 m), weight 115 lb 11.2 oz (52.5 kg), last menstrual period 06/07/2015, SpO2 100  %.  Physical Exam:  General: Well developed, well nourished female in no acute distress. Alert and oriented x 3.  Cardiovascular: Regular rate and rhythm. S1 and S2 normal.  Lungs: Clear to auscultation bilaterally. No wheezes/crackles/rhonchi noted.  Skin: No rashes or lesions present. Back: No CVA tenderness.  Abdomen: Abdomen soft, non-tender and non-obese. Active bowel sounds in all quadrants. No evidence of a fluid wave or abdominal masses.  Lap sites healed Extremities: No bilateral cyanosis, edema, or clubbing.  Pelvic exam: No residual granulation tissue at cuff. No nodulariy or lesions. Rectal: smooth, no nodularity or masses   Donaciano Eva, MD  03/04/2017, 3:13 PM

## 2017-03-07 ENCOUNTER — Encounter: Payer: Self-pay | Admitting: Gastroenterology

## 2017-03-10 ENCOUNTER — Telehealth: Payer: Self-pay | Admitting: Gynecologic Oncology

## 2017-03-10 NOTE — Telephone Encounter (Signed)
Attempted to call patient and daughter with chest xray results.  Unable to leave a message.

## 2017-03-10 NOTE — Telephone Encounter (Signed)
Returned call to patient but unavailable and unable to leave a message

## 2017-04-06 ENCOUNTER — Encounter: Payer: Self-pay | Admitting: Family Medicine

## 2017-04-06 ENCOUNTER — Ambulatory Visit: Payer: Self-pay | Attending: Family Medicine | Admitting: Family Medicine

## 2017-04-06 VITALS — BP 116/71 | HR 82 | Temp 97.5°F | Ht <= 58 in | Wt 117.8 lb

## 2017-04-06 DIAGNOSIS — M79642 Pain in left hand: Secondary | ICD-10-CM

## 2017-04-06 DIAGNOSIS — Z90710 Acquired absence of both cervix and uterus: Secondary | ICD-10-CM | POA: Insufficient documentation

## 2017-04-06 DIAGNOSIS — R042 Hemoptysis: Secondary | ICD-10-CM

## 2017-04-06 DIAGNOSIS — Z87442 Personal history of urinary calculi: Secondary | ICD-10-CM | POA: Insufficient documentation

## 2017-04-06 DIAGNOSIS — K146 Glossodynia: Secondary | ICD-10-CM

## 2017-04-06 DIAGNOSIS — Z8541 Personal history of malignant neoplasm of cervix uteri: Secondary | ICD-10-CM | POA: Insufficient documentation

## 2017-04-06 DIAGNOSIS — R252 Cramp and spasm: Secondary | ICD-10-CM | POA: Insufficient documentation

## 2017-04-06 DIAGNOSIS — Z86000 Personal history of in-situ neoplasm of breast: Secondary | ICD-10-CM | POA: Insufficient documentation

## 2017-04-06 DIAGNOSIS — C539 Malignant neoplasm of cervix uteri, unspecified: Secondary | ICD-10-CM

## 2017-04-06 DIAGNOSIS — Z08 Encounter for follow-up examination after completed treatment for malignant neoplasm: Secondary | ICD-10-CM | POA: Insufficient documentation

## 2017-04-06 DIAGNOSIS — K219 Gastro-esophageal reflux disease without esophagitis: Secondary | ICD-10-CM | POA: Insufficient documentation

## 2017-04-06 DIAGNOSIS — K920 Hematemesis: Secondary | ICD-10-CM | POA: Insufficient documentation

## 2017-04-06 DIAGNOSIS — Z9079 Acquired absence of other genital organ(s): Secondary | ICD-10-CM | POA: Insufficient documentation

## 2017-04-07 ENCOUNTER — Encounter: Payer: Self-pay | Admitting: Family Medicine

## 2017-04-07 MED ORDER — NYSTATIN 100000 UNIT/ML MT SUSP: 5.0000 mL | Freq: Four times a day (QID) | OROMUCOSAL | 0 refills | Status: DC

## 2017-04-07 MED ORDER — DICLOFENAC SODIUM 1 % TD GEL: 4.0000 g | Freq: Two times a day (BID) | TRANSDERMAL | 1 refills | Status: DC | PRN

## 2017-04-07 MED ORDER — METHOCARBAMOL 500 MG PO TABS: 500.0000 mg | ORAL_TABLET | Freq: Three times a day (TID) | ORAL | 1 refills | Status: DC | PRN

## 2017-04-07 MED FILL — NYSTATIN 100,000 UNITS/ML S: 100000 | 6 days supply | Qty: 120 | Fill #0

## 2017-04-07 MED FILL — METHOCARBAMOL 500 MG TABS: 500 | 30 days supply | Qty: 90 | Fill #0

## 2017-04-07 MED FILL — VOLTAREN 1% GEL: 1 | 12 days supply | Qty: 100 | Fill #0

## 2017-04-07 NOTE — Progress Notes (Signed)
Subjective:  Patient ID: Crystal [name], female    DOB: 05/25/1970  Age: 46 y.o. MRN: [id number]  CC: Hand Pain   HPI Crystal [name] is a 46 year old woman with a history of GERD, Stage 1B cervical cancer status post robotic-assisted type III radical laparoscopic hysterectomy with bilateral salpingectomy and bilateral sentinel lymph node biopsy in 06/2015 (no residual carcinoma as per GYN ) here for follow-up visit. Last visit to GYN was on 03/04/17 and routine annual Pap surveillance with HPV testing recommended.  She continues to vomit blood at night and chest x-ray ordered by GYN recently revealed no acute pulmonary process.  She has an upcoming appointment with GI for upper endoscopy. Denies abdominal pain, diarrhea or constipation.  She complains of pain at the base of her left thumb and denies history of trauma; pain is intermittent.  Denies swelling.  She also noticed burning of the anterior half of her tongue which she has had for the last few months to 1 year but denies burning in her mouth or her gums and has no toothache. Request a refill of Robaxin for neck muscle spasm; she received this in the past that it was helpful.  Past Medical History:  Diagnosis Date  . Cancer ([place])    cervical cancer dx.   . History of kidney stones    x1   . Kidney stones    cyst on kidney -no problems  . 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 PONV (postoperative nausea and vomiting)     Past Surgical History:  Procedure Laterality Date  . BREAST BIOPSY Left 08/28/2015   MRI- High Risk  . BREAST BIOPSY Right 08/28/2015   MRI- Benign  . BREAST EXCISIONAL BIOPSY Left 10/03/2015  . BREAST LUMPECTOMY WITH RADIOACTIVE SEED LOCALIZATION Left 10/03/2015   Procedure: LEFT BREAST LUMPECTOMY WITH RADIOACTIVE SEED LOCALIZATION;  Surgeon: Fanny Skates, MD;  Location: [facility];  Service: General;  Laterality: Left;  . CERVICAL BIOPSY  W/ LOOP  ELECTRODE EXCISION     12'16  . CESAREAN SECTION     2 previous  . ROBOTIC ASSISTED TOTAL HYSTERECTOMY Bilateral 07/01/2015   Procedure: XI ROBOTIC ASSISTED RADICAL TYPE II HYSTERECTOMY, BILATERAL SALPINGECTOMY, SENTINAL LYMPH NODE BIOPSY;  Surgeon: Everitt Amber, MD;  [facility];  Service: Gynecology;  Laterality: Bilateral;    No Known Allergies   Outpatient Medications Prior to Visit  Medication Sig Dispense Refill  . Misc Natural Products [facility]) CAPS Take 1 tablet by mouth at bedtime.    . Probiotic Product (PROBIOTIC COLON SUPPORT PO) Take 1 tablet by mouth daily.     No facility-administered medications prior to visit.     ROS Review of Systems Constitutional: Negative for activity change, appetite change and fatigue.  HENT: Negative for congestion, sinus pressure and sore throat.   Eyes: Negative for visual disturbance.  Respiratory: Negative for cough, chest tightness, shortness of breath and wheezing.   Cardiovascular: Negative for chest pain and palpitations.  Gastrointestinal:       See hpi  Endocrine: Negative for polydipsia.  Genitourinary: Negative for dysuria and frequency.  Musculoskeletal:       See hpi  Skin: Negative for rash.  Neurological: Negative for tremors, light-headedness and numbness.  Hematological: Does not bruise/bleed easily.  Psychiatric/Behavioral: Negative for agitation and behavioral problems.   Objective:  BP 116/71   Pulse 82   Temp (!) 97.5 F (36.4 C) (Oral)   Ht 4\' 10"  (1.473  m)   Wt 117 lb 12.8 oz (53.4 kg)   LMP 06/07/2015 (Exact Date)   SpO2 98%   BMI 24.62 kg/m   BP/Weight 04/06/2017 76/16/0737 1/0/6269  Systolic BP 485 462 703  Diastolic BP 71 60 74  Wt. (Lbs) 117.8 115.7 115.8  BMI 24.62 23.51 23.39      Physical Exam Constitutional: She is oriented to person, place, and time. She appears well-developed and well-nourished.  Cardiovascular: Normal rate, normal heart sounds and intact distal pulses.  No  murmur heard. Pulmonary/Chest: Effort normal and breath sounds normal. She has no wheezes. She has no rales. She exhibits no tenderness.  Abdominal: Soft. Bowel sounds are normal. She exhibits no distension and no mass. There is no tenderness.  Musculoskeletal: Normal range of motion.       Right wrist: She exhibits normal range of motion, no tenderness and no swelling.       Left wrist: She exhibits normal range of motion, no tenderness and no swelling.  Neurological: She is alert and oriented to person, place, and time.  Skin: Skin is warm and dry.  Psychiatric: She has a normal mood and affect.     EXAM: CHEST  2 VIEW  COMPARISON:  05/22/2016  FINDINGS: The heart size and mediastinal contours are within normal limits. Both lungs are clear. No pleural effusion or pneumothorax. The visualized skeletal structures are unremarkable.  IMPRESSION: Normal chest radiographs.   Electronically Signed   By: Lajean Manes M.D.   On: 03/04/2017 15:43  Assessment & Plan:   1. Hemoptysis versus hematemesis Chest x-ray unrevealing Has upcoming appointment for upper endoscopy  2. Tongue burning sensation No evidence of oral thrush on exam, no inflammation and gums but will treat presumptively - nystatin (MYCOSTATIN) 100000 UNIT/ML suspension; Take 5 mLs (500,000 Units total) 4 (four) times daily by mouth.  Dispense: 120 mL; Refill: 0  3. 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 Pain of left hand Pain in thenar emminence - diclofenac sodium (VOLTAREN) 1 % GEL; Apply 4 g 2 (two) times daily as needed topically.  Dispense: 100 g; Refill: 1   Meds ordered this encounter  Medications  . methocarbamol (ROBAXIN) 500 MG tablet    Sig: Take 1 tablet (500  mg total) every 8 (eight) hours as needed by mouth for muscle spasms.    Dispense:  90 tablet    Refill:  1  . nystatin (MYCOSTATIN) 100000 UNIT/ML suspension    Sig: Take 5 mLs (500,000 Units total) 4 (four) times daily by mouth.    Dispense:  120 mL    Refill:  0  . diclofenac sodium (VOLTAREN) 1 % GEL    Sig: Apply 4 g 2 (two) times daily as needed topically.    Dispense:  100 g    Refill:  1    Follow-up: Return in about 3 months (around 07/07/2017) for follow up of chronic medical conditions.   Arnoldo Morale MD

## 2017-04-19 ENCOUNTER — Ambulatory Visit (INDEPENDENT_AMBULATORY_CARE_PROVIDER_SITE_OTHER): Payer: Self-pay | Admitting: Gastroenterology

## 2017-04-19 ENCOUNTER — Encounter: Payer: Self-pay | Admitting: Gastroenterology

## 2017-04-19 VITALS — BP 102/80 | HR 76 | Ht <= 58 in | Wt 118.2 lb

## 2017-04-19 DIAGNOSIS — K5900 Constipation, unspecified: Secondary | ICD-10-CM

## 2017-04-19 DIAGNOSIS — R042 Hemoptysis: Secondary | ICD-10-CM

## 2017-04-19 DIAGNOSIS — R1013 Epigastric pain: Secondary | ICD-10-CM

## 2017-04-19 MED ORDER — OMEPRAZOLE 20 MG PO CPDR: 20.0000 mg | DELAYED_RELEASE_CAPSULE | Freq: Every day | ORAL | 3 refills | Status: DC

## 2017-04-19 MED ORDER — LINACLOTIDE 72 MCG PO CAPS: 72.0000 ug | ORAL_CAPSULE | Freq: Every day | ORAL | 3 refills | Status: DC

## 2017-04-19 NOTE — Progress Notes (Signed)
Crystal [name]    [id number]    27-Dec-1970  [facility], Charlane Ferretti, MD  Referring Physician: Everitt Amber, MD [place], [place] [address]  Chief complaint:  Gastritis  HPI: 46 year old female Hispanic, Spanish-speaking accompanied by language interpreter here for evaluation with complaints of chronic epigastric abdominal pain and?  Coughing up blood. Patient has been coughing up bright red blood intermittently for the past 2 years, worse in the recent past.  She recently had a dental visit and was told that she has an abscess in her molar, started on antibiotics.  She did not cough any blood in the past 1 week after she completed the antibiotics.  She was told that bleeding was coming from the abscess in her tooth, records not available to review during this visit.  Patient denies any dysphagia or odynophagia.  No shortness of breath.  She has intermittent heartburn and epigastric abdominal pain worse after she takes her medications .  No change with food intake .  Denies any blood per rectum or change in bowel habits .she is status post hysterectomy for cervical cancer . Review of systems patient complaining of worsening constipation, she is currently taking MiraLAX with no significant improvement.  Outpatient Encounter Medications as of 04/19/2017  Medication Sig  . diclofenac sodium (VOLTAREN) 1 % GEL Apply 4 g 2 (two) times daily as needed topically.  . methocarbamol (ROBAXIN) 500 MG tablet Take 1 tablet (500 mg total) every 8 (eight) hours as needed by mouth for muscle spasms.  . Misc Natural Products [facility]) CAPS Take 1 tablet by mouth at bedtime.  Marland Kitchen nystatin (MYCOSTATIN) 100000 UNIT/ML suspension Take 5 mLs (500,000 Units total) 4 (four) times daily by mouth.  . Probiotic Product (PROBIOTIC COLON SUPPORT PO) Take 1 tablet by mouth daily.   No facility-administered encounter medications on file as of 04/19/2017.     Allergies  as of 04/19/2017  . (No Known Allergies)    Past Medical History:  Diagnosis Date  . Cancer ([place])    cervical cancer dx.   . History of kidney stones    x1   . Kidney stones    cyst on kidney -no problems  . 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 PONV (postoperative nausea and vomiting)     Past Surgical History:  Procedure Laterality Date  . BREAST BIOPSY Left 08/28/2015   MRI- High Risk  . BREAST BIOPSY Right 08/28/2015   MRI- Benign  . BREAST EXCISIONAL BIOPSY Left 10/03/2015  . BREAST LUMPECTOMY WITH RADIOACTIVE SEED LOCALIZATION Left 10/03/2015   Procedure: LEFT BREAST LUMPECTOMY WITH RADIOACTIVE SEED LOCALIZATION;  Surgeon: Fanny Skates, MD;  Location: [place];  Service: General;  Laterality: Left;  . CERVICAL BIOPSY  W/ LOOP ELECTRODE EXCISION     12'16  . CESAREAN SECTION     2 previous  . ROBOTIC ASSISTED TOTAL HYSTERECTOMY Bilateral 07/01/2015   Procedure: XI ROBOTIC ASSISTED RADICAL TYPE II HYSTERECTOMY, BILATERAL SALPINGECTOMY, SENTINAL LYMPH NODE BIOPSY;  Surgeon: Everitt Amber, MD;  Location: [facility];  Service: Gynecology;  Laterality: Bilateral;    Family History  Problem Relation Age of Onset  . Hypertension Sister     Social History   Socioeconomic History  . Marital status: Widowed    Spouse name: Not on file  . Number of children: Not on file  . Years of education: Not on file  . Highest  education level: Not on file  Social Needs  . Financial resource strain: Not on file  . Food insecurity - worry: Not on file  . Food insecurity - inability: Not on file  . Transportation needs - medical: Not on file  . Transportation needs - non-medical: Not on file  Occupational History  . Not on file  Tobacco Use  . Smoking status: Never Smoker  . Smokeless tobacco: Never Used  Substance and Sexual Activity  . Alcohol use: No    Comment: occasionally  . Drug use: No  . Sexual activity: Yes    Birth control/protection:  Surgical  Other Topics Concern  . Not on file  Social History Narrative  . Not on file      Review of systems: Review of Systems  Constitutional: Negative for fever and chills.  HENT: Negative.   Eyes: Negative for blurred vision.  Respiratory: Negative for cough, shortness of breath and wheezing.   Cardiovascular: Negative for chest pain and palpitations.  Gastrointestinal: as per HPI Genitourinary: Negative for dysuria, urgency, frequency and hematuria.  Musculoskeletal: Positivetive for myalgias, back pain and joint pain.  Skin: Negative for itching and rash.  Neurological: Negative for dizziness, tremors, focal weakness, seizures and loss of consciousness.  Endo/Heme/Allergies: Positive for seasonal allergies.  Psychiatric/Behavioral: Negative for depression, suicidal ideas and hallucinations.  All other systems reviewed and are negative.   Physical Exam: Vitals:   04/19/17 1550  BP: 102/80  Pulse: 76   Body mass index is 24.7 kg/m. Gen:      No acute distress HEENT:  EOMI, sclera anicteric Neck:     No masses; no thyromegaly Lungs:    Clear to auscultation bilaterally; normal respiratory effort CV:         Regular rate and rhythm; no murmurs Abd:      + bowel sounds; soft, non-tender; no palpable masses, no distension Ext:    No edema; adequate peripheral perfusion Skin:      Warm and dry; no rash Neuro: alert and oriented x 3 Psych: normal mood and affect  Data Reviewed:  Reviewed labs, radiology imaging, old records and pertinent past GI work up   Assessment and Plan/Recommendations:  46 year old female with history of stage II cervical squamous cell carcinoma status post radical hysterectomy with bilateral salpingo-oophorectomy here for evaluation of chronic epigastric abdominal pain and coughing up blood She is no longer coughing up blood after she was treated with antibiotics for dental abscess Patient is a poor historian, unable to get timeline of her  symptoms or have her elaborate despite having language interpreter present during this office visit Given her chronic symptoms and epigastric abdominal pain, we will need to consider EGD for evaluation The risks and benefits as well as alternatives of endoscopic procedure(s) have been discussed and reviewed. All questions answered. The patient agrees to proceed. Start omeprazole 20 mg daily Avoid NSAIDs  Constipation: Increase dietary fluid and fiber intake Start Linzess 72 mcg daily   Damaris Hippo , MD [phone] Mon-Fri 8a-5p [phone] after 5p, weekends, holidays  CC: Everitt Amber, MD

## 2017-04-19 NOTE — Patient Instructions (Signed)
You have been scheduled for an endoscopy. Please follow written instructions given to you at your visit today. If you use inhalers (even only as needed), please bring them with you on the day of your procedure. Your physician has requested that you go to www.startemmi.com and enter the access code given to you at your visit today. This web site gives a general overview about your procedure. However, you should still follow specific instructions given to you by our office regarding your preparation for the procedure.  We will send in omeprazole to your pharmacy  We will send Linzess to your pharmacy today  Follow up as needed

## 2017-04-28 ENCOUNTER — Ambulatory Visit (INDEPENDENT_AMBULATORY_CARE_PROVIDER_SITE_OTHER): Payer: Self-pay | Admitting: Otolaryngology

## 2017-04-28 DIAGNOSIS — K1329 Other disturbances of oral epithelium, including tongue: Secondary | ICD-10-CM

## 2017-05-08 ENCOUNTER — Emergency Department (HOSPITAL_COMMUNITY)
Admission: EM | Admit: 2017-05-08 | Discharge: 2017-05-08 | Disposition: A | Discharge status: Home / Self Care | Payer: No Typology Code available for payment source | Attending: Emergency Medicine | Admitting: Emergency Medicine

## 2017-05-08 ENCOUNTER — Encounter (HOSPITAL_COMMUNITY): Payer: Self-pay | Admitting: Emergency Medicine

## 2017-05-08 DIAGNOSIS — M25561 Pain in right knee: Secondary | ICD-10-CM | POA: Insufficient documentation

## 2017-05-08 DIAGNOSIS — R110 Nausea: Secondary | ICD-10-CM | POA: Insufficient documentation

## 2017-05-08 DIAGNOSIS — Z8541 Personal history of malignant neoplasm of cervix uteri: Secondary | ICD-10-CM | POA: Diagnosis not present

## 2017-05-08 DIAGNOSIS — Z853 Personal history of malignant neoplasm of breast: Secondary | ICD-10-CM | POA: Diagnosis not present

## 2017-05-08 DIAGNOSIS — Y999 Unspecified external cause status: Secondary | ICD-10-CM | POA: Insufficient documentation

## 2017-05-08 DIAGNOSIS — Y92410 Unspecified street and highway as the place of occurrence of the external cause: Secondary | ICD-10-CM | POA: Insufficient documentation

## 2017-05-08 DIAGNOSIS — S060X0A Concussion without loss of consciousness, initial encounter: Secondary | ICD-10-CM | POA: Diagnosis not present

## 2017-05-08 DIAGNOSIS — M62838 Other muscle spasm: Secondary | ICD-10-CM

## 2017-05-08 DIAGNOSIS — Z79899 Other long term (current) drug therapy: Secondary | ICD-10-CM | POA: Insufficient documentation

## 2017-05-08 DIAGNOSIS — Y939 Activity, unspecified: Secondary | ICD-10-CM | POA: Insufficient documentation

## 2017-05-08 DIAGNOSIS — S29012A Strain of muscle and tendon of back wall of thorax, initial encounter: Secondary | ICD-10-CM | POA: Diagnosis not present

## 2017-05-08 DIAGNOSIS — S0990XA Unspecified injury of head, initial encounter: Secondary | ICD-10-CM | POA: Diagnosis present

## 2017-05-08 MED ORDER — IBUPROFEN 600 MG PO TABS: 600.0000 mg | ORAL_TABLET | Freq: Four times a day (QID) | ORAL | 0 refills | Status: DC | PRN

## 2017-05-08 MED ORDER — CYCLOBENZAPRINE HCL 10 MG PO TABS: 5.0000 mg | ORAL_TABLET | Freq: Every day | ORAL | 0 refills | Status: AC

## 2017-05-08 MED ORDER — CYCLOBENZAPRINE HCL 10 MG PO TABS
5.0000 mg | ORAL_TABLET | Freq: Once | ORAL | Status: AC
  Administered 2017-05-08: 5 mg via ORAL
  Filled 2017-05-08: qty 1

## 2017-05-08 MED ORDER — IBUPROFEN 800 MG PO TABS
800.0000 mg | ORAL_TABLET | Freq: Once | ORAL | Status: AC
  Administered 2017-05-08: 800 mg via ORAL
  Filled 2017-05-08: qty 1

## 2017-05-08 MED ORDER — ACETAMINOPHEN 500 MG PO TABS: 1000.0000 mg | ORAL_TABLET | Freq: Three times a day (TID) | ORAL | 0 refills | Status: AC
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
## 2017-05-08 NOTE — Discharge Instructions (Signed)
Puede tomar Motrin (Ibuprofen) o Aleve (Naproxen), Acetaminophen (Tylenol), crema para los musculos como SalonPas, Icy Hot, Bengay, etc. Puede estrechar, ponerce hielo o comprecion de calor, o que le den masaje. ° °
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
## 2017-05-08 NOTE — ED Notes (Signed)
Pt given saltines and orange juice.

## 2017-05-12 ENCOUNTER — Ambulatory Visit (AMBULATORY_SURGERY_CENTER): Payer: Self-pay | Admitting: Gastroenterology

## 2017-05-12 ENCOUNTER — Other Ambulatory Visit: Payer: Self-pay

## 2017-05-12 ENCOUNTER — Encounter: Payer: Self-pay | Admitting: Gastroenterology

## 2017-05-12 VITALS — BP 120/65 | HR 66 | Temp 97.8°F | Resp 12 | Ht <= 58 in | Wt 118.0 lb

## 2017-05-12 DIAGNOSIS — K319 Disease of stomach and duodenum, unspecified: Secondary | ICD-10-CM

## 2017-05-12 DIAGNOSIS — R1013 Epigastric pain: Secondary | ICD-10-CM

## 2017-05-12 LAB — HELICOBACTER PYLORI SCREEN-BIOPSY: UREASE: NEGATIVE

## 2017-05-12 MED ORDER — ONDANSETRON HCL 40 MG/20ML IJ SOLN
4.0000 mg | Freq: Once | INTRAMUSCULAR | Status: AC
  Administered 2017-05-12: 4 mg via INTRAVENOUS

## 2017-05-12 MED ORDER — SIMETHICONE 40 MG/0.6ML PO SUSP
300.0000 mg | Freq: Once | ORAL | Status: AC
  Administered 2017-05-12: 300 mg via ORAL

## 2017-05-12 MED ORDER — SODIUM CHLORIDE 0.9 % IV SOLN: 500.0000 mL | Freq: Once | INTRAVENOUS | Status: DC

## 2017-05-12 NOTE — Progress Notes (Signed)
Interpreter presentEstill Bamberg for admission questions.  Interpreter with Ipad will be used after [employer]

## 2017-05-12 NOTE — Op Note (Signed)
[facility] Patient Name: Crystal [name] Procedure Date: 05/12/2017 11:59 AM MRN: [id number] Endoscopist: Mauri Pole , MD Age: 46 Referring MD:  Date of Birth: 03/22/1971 Gender: Female Account #: [id number] Procedure:                Upper GI endoscopy Indications:              Epigastric abdominal pain, Suspected esophageal                            reflux. ?hematemesis Medicines:                Monitored Anesthesia Care Procedure:                Pre-Anesthesia Assessment:                           - Prior to the procedure, a History and Physical                            was performed, and patient medications and                            allergies were reviewed. The patient's tolerance of                            previous anesthesia was also reviewed. The risks                            and benefits of the procedure and the sedation                            options and risks were discussed with the patient.                            All questions were answered, and informed consent                            was obtained. Prior Anticoagulants: The patient has                            taken no previous anticoagulant or antiplatelet                            agents. ASA Grade Assessment: II - A patient with                            mild systemic disease. After reviewing the risks                            and benefits, the patient was deemed in                            satisfactory condition to undergo the procedure.  After obtaining informed consent, the endoscope was                            passed under direct vision. Throughout the                            procedure, the patient's blood pressure, pulse, and                            oxygen saturations were monitored continuously. The                            Model GIF-[id number]) scope was introduced                            through the mouth, and  advanced to the second part                            of duodenum. The upper GI endoscopy was                            accomplished without difficulty. The patient                            tolerated the procedure well. Scope In: Scope Out: Findings:                 The esophagus was normal.                           Patchy mildly erythematous mucosa without bleeding                            was found in the entire examined stomach. Biopsies                            were taken with a cold forceps for Helicobacter                            pylori testing using CLOtest.                           The examined duodenum was normal. Complications:            No immediate complications. Estimated Blood Loss:     Estimated blood loss was minimal. Impression:               - Normal esophagus.                           - Erythematous mucosa in the stomach. Biopsied.                           - Normal examined duodenum. Recommendation:           - Patient has a contact number available for  emergencies. The signs and symptoms of potential                            delayed complications were discussed with the                            patient. Return to normal activities tomorrow.                            Written discharge instructions were provided to the                            patient.                           - Resume previous diet.                           - Continue present medications.                           - Await pathology results.                           - Continue Omeprazole 20mg  daily Mauri Pole, MD 05/12/2017 12:23:40 PM This report has been signed electronically.

## 2017-05-12 NOTE — Addendum Note (Signed)
Addended by: Wendie Chess on: 05/12/2017 04:11 PM   Modules accepted: Orders

## 2017-05-12 NOTE — Progress Notes (Signed)
Report given to PACU, vss 

## 2017-05-12 NOTE — Progress Notes (Signed)
Zofran 4 mg IV given at 1048.  Diluted 2 mg of Zofran in 10 mL of normal saline

## 2017-05-12 NOTE — Progress Notes (Signed)
Patient received in recovery post upper endoscopy. All care and assessments done with the assistance of the video interpreter with care partner present. Patient c/o pain stomach discomfort. Patient already received Zofran in admitting for c/o nausea. Patient asked to try to pass gas and belch and explained to patient that it is normal to have residual air in the stomach and bowel post-procedure and she needs to try to get the air out. Patient positioned and repositioned to assist in passing of gas/belching without success. Dr. Silverio Decamp notified. Administered Simethicone drops 71ml po per v.o. Dr. Silverio Decamp. Patient able to belch and reports some relief of discomfort. Patient still appears anxious and c/o SOB. O2 sat 100%, patient has good color and is not using accessory muscles. Patient reports complete relief of SOB after asking patient to breath slowly and calming patient. Patient given verbal discharge instructions using the interpreter and given discharge instructions and handouts in Spanish (GERD and anti-reflux measures) to take home as well. Patient reminded to take her  Omeprazole as prescribed per Dr. Woodward Ku instructions (patient states she never started this med). Patient also told that Dr. Silverio Decamp has called in an additional medication to her pharmacy to try to help with her constipation and to please try this medication.Patient states that her 27 year old daughter will be home tomorrow am when we do call-backs and can assist with the language barrier.

## 2017-05-12 NOTE — Patient Instructions (Addendum)
YOU HAD AN ENDOSCOPIC PROCEDURE TODAY AT [facility]:   Refer to the procedure report that was given to you for any specific questions about what was found during the examination.  If the procedure report does not answer your questions, please call your gastroenterologist to clarify.  If you requested that your care partner not be given the details of your procedure findings, then the procedure report has been included in a sealed envelope for you to review at your convenience later.  YOU SHOULD EXPECT: Some feelings of bloating in the abdomen. Passage of more gas than usual.  Walking can help get rid of the air that was put into your GI tract during the procedure and reduce the bloating. If you had a lower endoscopy (such as a colonoscopy or flexible sigmoidoscopy) you may notice spotting of blood in your stool or on the toilet paper. If you underwent a bowel prep for your procedure, you may not have a normal bowel movement for a few days.  Please Note:  You might notice some irritation and congestion in your nose or some drainage.  This is from the oxygen used during your procedure.  There is no need for concern and it should clear up in a day or so.  SYMPTOMS TO REPORT IMMEDIATELY:   Following upper endoscopy (EGD)  Vomiting of blood or coffee ground material  New chest pain or pain under the shoulder blades  Painful or persistently difficult swallowing  New shortness of breath  Fever of 100F or higher  Black, tarry-looking stools  For urgent or emergent issues, a gastroenterologist can be reached at any hour by calling [phone].   DIET:  We do recommend a small meal at first, but then you may proceed to your regular diet.  Drink plenty of fluids but you should avoid alcoholic beverages for 24 hours.  MEDICATIONS: Continue present medications. Continue Omeprazole 20 mg by mouth daily. Dr. Silverio Decamp has called in a medication to your pharmacy for you to try to help  relieve your constipation. Please try this medication to see if it will help you.  Please see handouts given to you by your recovery nurse.   ACTIVITY:  You should plan to take it easy for the rest of today and you should NOT DRIVE or use heavy machinery until tomorrow (because of the sedation medicines used during the test).    FOLLOW UP: Our staff will call the number listed on your records the next business day following your procedure to check on you and address any questions or concerns that you may have regarding the information given to you following your procedure. If we do not reach you, we will leave a message.  However, if you are feeling well and you are not experiencing any problems, there is no need to return our call.  We will assume that you have returned to your regular daily activities without incident.  If any biopsies were taken you will be contacted by phone or by letter within the next 1-3 weeks.  Please call us at [phone] if you have not heard about the biopsies in 3 weeks.    Thank you for allowing [place] to provide for your healthcare needs today.  SIGNATURES/CONFIDENTIALITY: You and/or your care partner have signed paperwork which will be entered into your electronic medical record.  These signatures attest to the fact that that the information above on your After Visit Summary has been reviewed and is understood.  Full responsibility of the confidentiality of this discharge information lies with you and/or your care-partner.

## 2017-05-12 NOTE — Progress Notes (Signed)
Called to room to assist during endoscopic procedure.  Patient ID and intended procedure confirmed with present staff. Received instructions for my participation in the procedure from the performing physician.  

## 2017-05-13 ENCOUNTER — Telehealth: Payer: Self-pay | Admitting: *Deleted

## 2017-05-13 NOTE — Telephone Encounter (Signed)
Patient's daughter calling states patient is feeling much better.

## 2017-05-13 NOTE — Telephone Encounter (Signed)
  Follow up Call-  Call back number 05/12/2017  Post procedure Call Back phone  # [phone] speak with daugher Colletta [place]  Permission to leave phone message Yes  Some recent data might be hidden     Patient questions:  Line is busy.  Will try again later although patient told us to call early so her daughter who speaks English could answer questions.

## 2017-05-13 NOTE — Procedures (Signed)
Patient had upper endoscopy 05/12/17. Simethicone oral susp given in recovery, charted incorrected as 300 mg given. The correct dose given was 40 mg po after which patient experienced relief of her gas symptoms.

## 2017-05-13 NOTE — Telephone Encounter (Signed)
  Follow up Call-  Call back number 05/12/2017  Post procedure Call Back phone  # [phone] speak with daugher Crystal [name]  Permission to leave phone message Yes  Some recent data might be hidden     Patient questions:  Do you have a fever, pain , or abdominal swelling? No. Pain Score  0 *  Have you tolerated food without any problems? Yes.    Have you been able to return to your normal activities? Yes.    Do you have any questions about your discharge instructions: Diet   No. Medications  No. Follow up visit  No.  Do you have questions or concerns about your Care? No.  Actions: * If pain score is 4 or above: No action needed, pain <4.

## 2017-05-20 MED FILL — ?OMEPRAZOLE DR 20MG CAPSULE: 20 | 30 days supply | Qty: 30 | Fill #0

## 2017-06-20 ENCOUNTER — Emergency Department (HOSPITAL_COMMUNITY)
Admission: EM | Admit: 2017-06-20 | Discharge: 2017-06-20 | Discharge status: Absent without leave | Payer: No Typology Code available for payment source

## 2017-06-20 ENCOUNTER — Other Ambulatory Visit: Payer: Self-pay

## 2017-06-22 ENCOUNTER — Ambulatory Visit: Payer: Self-pay | Attending: Family Medicine | Admitting: Physician Assistant

## 2017-06-22 VITALS — BP 121/80 | HR 77 | Temp 97.8°F | Resp 16 | Wt 118.8 lb

## 2017-06-22 DIAGNOSIS — Z853 Personal history of malignant neoplasm of breast: Secondary | ICD-10-CM | POA: Insufficient documentation

## 2017-06-22 DIAGNOSIS — Z87442 Personal history of urinary calculi: Secondary | ICD-10-CM | POA: Insufficient documentation

## 2017-06-22 DIAGNOSIS — Q383 Other congenital malformations of tongue: Secondary | ICD-10-CM

## 2017-06-22 DIAGNOSIS — N898 Other specified noninflammatory disorders of vagina: Secondary | ICD-10-CM | POA: Insufficient documentation

## 2017-06-22 DIAGNOSIS — Z8541 Personal history of malignant neoplasm of cervix uteri: Secondary | ICD-10-CM | POA: Insufficient documentation

## 2017-06-22 DIAGNOSIS — Z79899 Other long term (current) drug therapy: Secondary | ICD-10-CM | POA: Insufficient documentation

## 2017-06-22 DIAGNOSIS — K149 Disease of tongue, unspecified: Secondary | ICD-10-CM | POA: Insufficient documentation

## 2017-06-22 DIAGNOSIS — N3000 Acute cystitis without hematuria: Secondary | ICD-10-CM | POA: Insufficient documentation

## 2017-06-22 LAB — POCT URINALYSIS DIPSTICK
BILIRUBIN UA: NEGATIVE
Glucose, UA: NEGATIVE
Ketones, UA: NEGATIVE
NITRITE UA: NEGATIVE
PH UA: 6 (ref 5.0–8.0)
PROTEIN UA: NEGATIVE
SPEC GRAV UA: 1.015 (ref 1.010–1.025)
Urobilinogen, UA: 0.2 E.U./dL

## 2017-06-22 MED ORDER — FLUCONAZOLE 150 MG PO TABS: 150.0000 mg | ORAL_TABLET | Freq: Once | ORAL | 0 refills | Status: AC

## 2017-06-22 MED ORDER — IBUPROFEN 600 MG PO TABS: 600.0000 mg | ORAL_TABLET | Freq: Four times a day (QID) | ORAL | 0 refills | Status: DC | PRN

## 2017-06-22 MED ORDER — NITROFURANTOIN MONOHYD MACRO 100 MG PO CAPS: 100.0000 mg | ORAL_CAPSULE | Freq: Two times a day (BID) | ORAL | 0 refills | Status: DC

## 2017-06-22 MED FILL — ?NITROFURANTOIN-MACRO 100 M: 100 | 5 days supply | Qty: 10 | Fill #0

## 2017-06-22 MED FILL — FLUCONAZOLE 150 MG TABLET: 150 | 1 days supply | Qty: 1 | Fill #0

## 2017-06-22 NOTE — Progress Notes (Signed)
Pt states she has some vaginal itching and burning when she urinates  Pt states her tongue burns  Pt states her lips get real dry  Pt states she has shoulder pain  Pt states the Robaxin hurts her stomach

## 2017-06-22 NOTE — Progress Notes (Signed)
Patient ID: Crystal [name], female   DOB: 1970/07/19, 47 y.o.   MRN: [id number]      Alletta Mattos, is a 47 y.o. female  [id number]  [id number]  DOB - [birth year]  Subjective:  Chief Complaint and HPI: Crystal [name] is a 47 y.o. female here today for tongue and bladder/vaginal issues. 2-3 weeks ago used OTC yeast infection medication.  She is having vaginal itching and burning with urination and itching.  No f/c. No abdominal pain.  She speaks pretty good english and her daughter helps with any gaps.  She also c/o issues with the tip of her tongue feeling swollen and/or tingling.  No lesions.  This has been going on for about 3 months.    ROS:   Constitutional:  No f/c, No night sweats, No unexplained weight loss. EENT:  No vision changes, No blurry vision, No hearing changes. No other mouth, throat, or ear problems.  Respiratory: No cough, No SOB Cardiac: No CP, no palpitations GI:  No abd pain, No N/V/D. GU: + Urinary s/sx Musculoskeletal: No joint pain Neuro: No headache, no dizziness, no motor weakness.  Skin: No rash Endocrine:  No polydipsia. No polyuria.  Psych: Denies SI/HI  No problems updated.  ALLERGIES: No Known Allergies  PAST MEDICAL HISTORY: Past Medical History:  Diagnosis Date  . Cancer ([place])    cervical cancer dx.   . History of kidney stones    x1   . Kidney stones    cyst on kidney -no problemsm hx of kidney stones  . 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 PONV (postoperative nausea and vomiting)     MEDICATIONS AT HOME: Prior to Admission medications   Medication Sig Start Date End Date Taking? Authorizing Provider  diclofenac sodium (VOLTAREN) 1 % GEL Apply 4 g 2 (two) times daily as needed topically. Patient not taking: Reported on 04/19/2017 04/07/17   Charlott Rakes, MD  fluconazole (DIFLUCAN) 150 MG tablet Take 1 tablet (150 mg total) by mouth once for 1 dose. 06/22/17  06/22/17  Argentina Donovan, PA-C  ibuprofen (ADVIL,MOTRIN) 600 MG tablet Take 1 tablet (600 mg total) by mouth every 6 (six) hours as needed. 05/08/17   Fatima Blank, MD  linaclotide [facility]) 72 MCG capsule Take 1 capsule (72 mcg total) by mouth daily before breakfast. 04/19/17   Nandigam, Venia Minks, MD  methocarbamol (ROBAXIN) 500 MG tablet Take 1 tablet (500 mg total) every 8 (eight) hours as needed by mouth for muscle spasms. Patient not taking: Reported on 04/19/2017 04/07/17   Charlott Rakes, MD  Misc Natural Products [facility]) CAPS Take 1 tablet by mouth at bedtime.    [provider]  nitrofurantoin, macrocrystal-monohydrate, (MACROBID) 100 MG capsule Take 1 capsule (100 mg total) by mouth 2 (two) times daily. 06/22/17   Argentina Donovan, PA-C  NON FORMULARY OTC colon cleanse    [provider]  nystatin (MYCOSTATIN) 100000 UNIT/ML suspension Take 5 mLs (500,000 Units total) 4 (four) times daily by mouth. Patient not taking: Reported on 04/19/2017 04/07/17   Charlott Rakes, MD  omeprazole (PRILOSEC) 20 MG capsule Take 1 capsule (20 mg total) by mouth daily. Patient not taking: Reported on 05/12/2017 04/19/17   Mauri Pole, MD  Probiotic Product (PROBIOTIC COLON SUPPORT PO) Take 1 tablet by mouth daily.    [provider]     Objective:  EXAM:   Vitals:   06/22/17 1549  BP:  121/80  Pulse: 77  Resp: 16  Temp: 97.8 F (36.6 C)  TempSrc: Oral  SpO2: 99%  Weight: 118 lb 12.8 oz (53.9 kg)    General appearance : A&OX3. NAD. Non-toxic-appearing HEENT: Atraumatic and Normocephalic.  PERRLA. EOM intact.  TM clear B. Mouth-MMM, post pharynx WNL w/o erythema, No PND. No visible tongue abnormality.  Neck: supple, no JVD. No cervical lymphadenopathy. No thyromegaly Chest/Lungs:  Breathing-non-labored, Good air entry bilaterally, breath sounds normal without rales, rhonchi, or wheezing  CVS: S1 S2 regular, no murmurs, gallops, rubs    Abdomen: Bowel sounds present, Non tender and not distended with no gaurding, rigidity or rebound. Extremities: Bilateral Lower Ext shows no edema, both legs are warm to touch with = pulse throughout Neurology:  CN II-XII grossly intact, Non focal.   Psych:  TP linear. J/I WNL. Normal speech. Appropriate eye contact and affect.  Skin:  No Rash  Data Review Lab Results  Component Value Date   HGBA1C 5.5 04/04/2015     Assessment & Plan   1. Vaginal irritation - Urinalysis Dipstick - fluconazole (DIFLUCAN) 150 MG tablet; Take 1 tablet (150 mg total) by mouth once for 1 dose.  Dispense: 1 tablet; Refill: 0 - Urine cytology ancillary only  2. Acute cystitis without hematuria - Urine Culture - nitrofurantoin, macrocrystal-monohydrate, (MACROBID) 100 MG capsule; Take 1 capsule (100 mg total) by mouth 2 (two) times daily.  Dispense: 10 capsule; Refill: 0 - Urine cytology ancillary only  3. Tongue abnormality No visual abnormality.  No taste disturbance.   - CBC with Differential/Platelet - TSH - B12 and Folate Panel     Patient have been counseled extensively about nutrition and exercise  Return in about 6 weeks (around 08/03/2017) for Dr Newlin-f/up tongue and other issues.  The patient was given clear instructions to go to ER or return to medical center if symptoms don't improve, worsen or new problems develop. The patient verbalized understanding. The patient was told to call to get lab results if they haven't heard anything in the next week.     Freeman Caldron, PA-C [facility] and [place], [place]   06/22/2017, 4:10 PM

## 2017-06-23 LAB — CBC WITH DIFFERENTIAL/PLATELET
BASOS ABS: 0 10*3/uL (ref 0.0–0.2)
Basos: 0 %
EOS (ABSOLUTE): 0.3 10*3/uL (ref 0.0–0.4)
Eos: 4 %
Hematocrit: 41.7 % (ref 34.0–46.6)
Hemoglobin: 14.4 g/dL (ref 11.1–15.9)
Immature Grans (Abs): 0 10*3/uL (ref 0.0–0.1)
Immature Granulocytes: 0 %
LYMPHS ABS: 1.6 10*3/uL (ref 0.7–3.1)
Lymphs: 23 %
MCH: 33.1 pg — AB (ref 26.6–33.0)
MCHC: 34.5 g/dL (ref 31.5–35.7)
MCV: 96 fL (ref 79–97)
Monocytes Absolute: 0.8 10*3/uL (ref 0.1–0.9)
Monocytes: 11 %
Neutrophils Absolute: 4.2 10*3/uL (ref 1.4–7.0)
Neutrophils: 62 %
PLATELETS: 264 10*3/uL (ref 150–379)
RBC: 4.35 x10E6/uL (ref 3.77–5.28)
RDW: 12.8 % (ref 12.3–15.4)
WBC: 7 10*3/uL (ref 3.4–10.8)

## 2017-06-23 LAB — B12 AND FOLATE PANEL
Folate: >20 ng/mL (ref >3.0)
Vitamin B-12: 1073 pg/mL (ref 232–1245)

## 2017-06-23 LAB — URINE CYTOLOGY ANCILLARY ONLY
CHLAMYDIA, DNA PROBE: NEGATIVE
NEISSERIA GONORRHEA: NEGATIVE
TRICH (WINDOWPATH): NEGATIVE

## 2017-06-23 LAB — TSH: TSH: 1.53 u[IU]/mL (ref 0.450–4.500)

## 2017-06-24 ENCOUNTER — Telehealth: Payer: Self-pay

## 2017-06-24 ENCOUNTER — Ambulatory Visit: Payer: Self-pay | Attending: Family Medicine

## 2017-06-24 LAB — URINE CULTURE

## 2017-06-24 NOTE — Telephone Encounter (Signed)
CMA attempt to call patient regarding lab results and advising.   Patient did not answer and unable to leave a VM due to no voicemail been send through.   Letter will be send out to patient.  If patient call please let her know:  Please call patient.  Her urine culture did show infection and the antibiotic I put her on should take care of it.  There were no vaginal infections.  Her blood work was normal.  Follow-up as planned. Thanks, Freeman Caldron, PA-C

## 2017-06-24 NOTE — Telephone Encounter (Signed)
-----   Message from Argentina Donovan, [place] sent at 06/24/2017  4:06 PM EST ----- Please call patient.  Her urine culture did show infection and the antibiotic I put her on should take care of it.  There were no vaginal infections.  Her blood work was normal.  Follow-up as planned. Thanks, Freeman Caldron, PA-C

## 2017-06-27 LAB — URINE CYTOLOGY ANCILLARY ONLY
Bacterial vaginitis: NEGATIVE
Candida vaginitis: NEGATIVE

## 2017-06-28 ENCOUNTER — Telehealth: Payer: Self-pay

## 2017-06-28 NOTE — Telephone Encounter (Signed)
CMA called patient to inform on lab result.  Spanish interpreter Dedra Skeens [phone] assist with the call.  Patient understood.

## 2017-06-28 NOTE — Telephone Encounter (Signed)
-----   Message from Argentina Donovan, [place] sent at 06/28/2017 11:13 AM EST ----- Please call patient.  Her testing for vaginal yeast and vaginal bacteria through her urine tests were negative.  Follow-up as planned.  Thanks, Freeman Caldron, PA-C

## 2017-07-06 ENCOUNTER — Telehealth: Payer: Self-pay | Admitting: *Deleted

## 2017-07-06 ENCOUNTER — Telehealth: Payer: Self-pay | Admitting: Gastroenterology

## 2017-07-06 ENCOUNTER — Ambulatory Visit: Payer: Self-pay | Attending: Family Medicine | Admitting: Physician Assistant

## 2017-07-06 VITALS — BP 110/69 | HR 78 | Temp 98.1°F | Resp 16 | Wt 117.8 lb

## 2017-07-06 DIAGNOSIS — Z789 Other specified health status: Secondary | ICD-10-CM

## 2017-07-06 DIAGNOSIS — Z87442 Personal history of urinary calculi: Secondary | ICD-10-CM | POA: Insufficient documentation

## 2017-07-06 DIAGNOSIS — K148 Other diseases of tongue: Secondary | ICD-10-CM | POA: Insufficient documentation

## 2017-07-06 DIAGNOSIS — Z8541 Personal history of malignant neoplasm of cervix uteri: Secondary | ICD-10-CM | POA: Insufficient documentation

## 2017-07-06 DIAGNOSIS — Z79899 Other long term (current) drug therapy: Secondary | ICD-10-CM | POA: Insufficient documentation

## 2017-07-06 DIAGNOSIS — Z853 Personal history of malignant neoplasm of breast: Secondary | ICD-10-CM | POA: Insufficient documentation

## 2017-07-06 NOTE — Progress Notes (Signed)
Patient ID: Shellia Hartl Ramirez-Flores, female   DOB: [birth year], 47 y.o.   MRN: [id number]       Toluwani Ruder, is a 47 y.o. female  [id number]:[id number]  [id number]  DOB - 06-24-1970  Subjective:  Chief Complaint and HPI: Kitana Gage is a 47 y.o. female here today Still having a problem with her tongue.  There is a lesion/swelling now that is painful and burning on the L side of her tongue.  No change in appetite.    Christy Sartorius with [employer] interpreters translating.  ROS:   Constitutional:  No f/c, No night sweats, No unexplained weight loss. EENT:  No vision changes, No blurry vision, No hearing changes. No other mouth, throat, or ear problems.  Respiratory: No cough, No SOB Cardiac: No CP, no palpitations GI:  No abd pain, No N/V/D. GU: No Urinary s/sx Musculoskeletal: No joint pain Neuro: No headache, no dizziness, no motor weakness.  Skin: No rash Endocrine:  No polydipsia. No polyuria.  Psych: Denies SI/HI  No problems updated.  ALLERGIES: No Known Allergies  PAST MEDICAL HISTORY: Past Medical History:  Diagnosis Date  . Cancer ([place])    cervical cancer dx.   . History of kidney stones    x1   . Kidney stones    cyst on kidney -no problemsm hx of kidney stones  . 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 PONV (postoperative nausea and vomiting)     MEDICATIONS AT HOME: Prior to Admission medications   Medication Sig Start Date End Date Taking? Authorizing Provider  diclofenac sodium (VOLTAREN) 1 % GEL Apply 4 g 2 (two) times daily as needed topically. Patient not taking: Reported on 04/19/2017 04/07/17   Charlott Rakes, MD  ibuprofen (ADVIL,MOTRIN) 600 MG tablet Take 1 tablet (600 mg total) by mouth every 6 (six) hours as needed. 06/22/17   Argentina Donovan, PA-C  linaclotide (LINZESS) 72 MCG capsule Take 1 capsule (72 mcg total) by mouth daily before breakfast. 04/19/17   Nandigam, Venia Minks, MD  methocarbamol  (ROBAXIN) 500 MG tablet Take 1 tablet (500 mg total) every 8 (eight) hours as needed by mouth for muscle spasms. Patient not taking: Reported on 04/19/2017 04/07/17   Charlott Rakes, MD  Misc Natural Products [facility]) CAPS Take 1 tablet by mouth at bedtime.    [provider]  nitrofurantoin, macrocrystal-monohydrate, (MACROBID) 100 MG capsule Take 1 capsule (100 mg total) by mouth 2 (two) times daily. 06/22/17   Argentina Donovan, PA-C  NON FORMULARY OTC colon cleanse    [provider]  nystatin (MYCOSTATIN) 100000 UNIT/ML suspension Take 5 mLs (500,000 Units total) 4 (four) times daily by mouth. Patient not taking: Reported on 04/19/2017 04/07/17   Charlott Rakes, MD  omeprazole (PRILOSEC) 20 MG capsule Take 1 capsule (20 mg total) by mouth daily. Patient not taking: Reported on 05/12/2017 04/19/17   Mauri Pole, MD  Probiotic Product (PROBIOTIC COLON SUPPORT PO) Take 1 tablet by mouth daily.    [provider]     Objective:  EXAM:   Vitals:   07/06/17 1539  BP: 110/69  Pulse: 78  Resp: 16  Temp: 98.1 F (36.7 C)  TempSrc: Oral  SpO2: 98%  Weight: 117 lb 12.8 oz (53.4 kg)    General appearance : A&OX3. NAD. Non-toxic-appearing HEENT: Atraumatic and Normocephalic.  PERRLA. EOM intact.  TM clear B. Mouth-MMM, post pharynx WNL w/o erythema, No PND.  L side of  tongue, there is a 5-14mm papule that is hyperpigmented.  No submandibular LN.  No supraclavicular nodes Neck: supple, no JVD. No cervical lymphadenopathy. No thyromegaly Chest/Lungs:  Breathing-non-labored, Good air entry bilaterally, breath sounds normal without rales, rhonchi, or wheezing  CVS: S1 S2 regular, no murmurs, gallops, rubs  Extremities: Bilateral Lower Ext shows no edema, both legs are warm to touch with = pulse throughout Neurology:  CN II-XII grossly intact, Non focal.   Psych:  TP linear. J/I WNL. Normal speech. Appropriate eye contact and affect.  Skin:  No  Rash  Data Review Lab Results  Component Value Date   HGBA1C 5.5 04/04/2015     Assessment & Plan   1. Tongue lesion Salt water gargles tid - Ambulatory referral to ENT  2. Language barrier [employer] interpreters used and additional time performing visit was required.   Patient have been counseled extensively about nutrition and exercise  Return for keep 08/12/2017 with Dr Margarita Rana.  The patient was given clear instructions to go to ER or return to medical center if symptoms don't improve, worsen or new problems develop. The patient verbalized understanding. The patient was told to call to get lab results if they haven't heard anything in the next week.     Freeman Caldron, PA-C [facility] and [place], [place]   07/06/2017, 3:59 PM

## 2017-07-06 NOTE — Telephone Encounter (Signed)
Patient called and scheduled appt for April 5th at 1pm

## 2017-07-07 NOTE — Telephone Encounter (Signed)
Patient says she doesn't remember the letter. Advised "no H Pylori" and results were good. She thanks me for the call.

## 2017-07-28 ENCOUNTER — Ambulatory Visit: Payer: Self-pay | Attending: Family Medicine | Admitting: Physician Assistant

## 2017-07-28 ENCOUNTER — Ambulatory Visit: Payer: Self-pay | Admitting: Licensed Clinical Social Worker

## 2017-07-28 ENCOUNTER — Other Ambulatory Visit: Payer: Self-pay

## 2017-07-28 VITALS — BP 111/71 | HR 76 | Temp 97.9°F | Ht <= 58 in | Wt 116.0 lb

## 2017-07-28 DIAGNOSIS — N644 Mastodynia: Secondary | ICD-10-CM | POA: Insufficient documentation

## 2017-07-28 DIAGNOSIS — Z8541 Personal history of malignant neoplasm of cervix uteri: Secondary | ICD-10-CM | POA: Insufficient documentation

## 2017-07-28 DIAGNOSIS — F419 Anxiety disorder, unspecified: Secondary | ICD-10-CM | POA: Insufficient documentation

## 2017-07-28 DIAGNOSIS — N6310 Unspecified lump in the right breast, unspecified quadrant: Secondary | ICD-10-CM

## 2017-07-28 DIAGNOSIS — Z114 Encounter for screening for human immunodeficiency virus [HIV]: Secondary | ICD-10-CM

## 2017-07-28 DIAGNOSIS — R079 Chest pain, unspecified: Secondary | ICD-10-CM | POA: Insufficient documentation

## 2017-07-28 DIAGNOSIS — M62838 Other muscle spasm: Secondary | ICD-10-CM | POA: Insufficient documentation

## 2017-07-28 DIAGNOSIS — Z853 Personal history of malignant neoplasm of breast: Secondary | ICD-10-CM | POA: Insufficient documentation

## 2017-07-28 MED ORDER — METHOCARBAMOL 500 MG PO TABS: 500.0000 mg | ORAL_TABLET | Freq: Three times a day (TID) | ORAL | 1 refills | Status: DC | PRN

## 2017-07-28 MED ORDER — IBUPROFEN 600 MG PO TABS: 600.0000 mg | ORAL_TABLET | Freq: Four times a day (QID) | ORAL | 0 refills | Status: DC | PRN

## 2017-07-28 MED FILL — METHOCARBAMOL 500 MG TABLET: 500 | 30 days supply | Qty: 90 | Fill #0

## 2017-07-28 MED FILL — IBUPROFEN 600 MG TABLET: 600 | 7 days supply | Qty: 30 | Fill #0

## 2017-07-28 NOTE — Progress Notes (Signed)
ekg Pt's is here for back pain that's has been hurting her for 3 weeks. Pain all over back, shoulder, neck.   Pain with trouble breathing.   Pt's stated she have a lump on her right breast, and no pain.   Pt's request a HIV test done.

## 2017-07-28 NOTE — Patient Instructions (Signed)
Vivir con ansiedad Living With Anxiety Despus de haber sido diagnosticado con trastorno de ansiedad, podra sentirse aliviado por comprender por qu se haba sentido o haba actuado de cierto modo. Adems, es natural sentirse abrumado por el tratamiento que tiene por delante y por lo que este significar para su vida. Con atencin y [place], [place] trastorno y Marine scientist. Cmo hacer frente a la ansiedad Enfrentar el estrs El estrs es la reaccin del cuerpo ante los cambios y los acontecimientos de la vida, tanto buenos [place]. El estrs puede durar solo algunas horas o puede ser [place]. El estrs puede influir mucho en la ansiedad, por lo que es importante aprender sobre cmo hacerle frente y cmo pensarlo de un modo nuevo. Hable con el mdico o un orientador psicolgico para obtener ms informacin sobre cmo Software engineer. Podran sugerirle algunas tcnicas para hacerlo, como:  Musicoterapia. Esto podra incluir crear o escuchar msica que disfrute y lo inspire.  Meditacin consciente. Esto implica prestar atencin a la respiracin normal ms que intentar controlarla. Puede realizarse mientras est sentado o camina.  Oracin centrante. Este es un tipo de meditacin que implica centrarse en una palabra, frase o imagen sagrada que le sea representativa y le genere paz.  Respiracin profunda. Para hacer esto, expanda el estmago e inhale lentamente por la nariz. Mantenga el aire durante un lapso de [place]. Luego, exhale lentamente mientras deja que los msculos del estmago se relajen.  Dilogo interno. Se trata de una habilidad por la que usted es capaz de identificar patrones de pensamiento que lo llevan a Best boy reacciones ansiosas y de corregir dichos pensamientos.  Relajacin muscular. Esto implica tensar los msculos y, [place], [place].  Elija una tcnica para reducir el estrs que se adapte a su estilo de vida y su personalidad. Las tcnicas para  reducir el estrs llevan tiempo y Location manager. Resrvese de [id number] por da para hAmbulance person Algunos terapeutas pueden ofrecerle capacitacin para aprenderlas. Es posible que algunos planes de seguro mdico cubran la capacitacin. Otras cosas que puede hacer para manejar el estrs:  LCatering managerun registro del estrs. Esto puede ayudarlo a iFinancial plannerestrs y modos de cChief Technology Officersu reaccin.  Pensar en cmo reacciona ante ciertas situaciones. Es posible que no sea capaz de cChief Technology Officertodo, pero puede controlar su reaccin.  Hacerse tiempo para las actividades que lo ayudan a rNurse, children'sy no sentir culpa por pasar su tiempo de [place]  La terapia en combinacin con las habilidades para enfrentar y reducir el estrs proporciona la mejor alternativa para un tratamiento satisfactorio. Medicamentos Los medicamentos pueden ayudar a aE[employer] Algunos medicamentos para la ansiedad:  MTeacher, adult educationansiedad.  Antidepresivos.  Betabloqueantes.  Es posible que se requieran medicamentos, junto con la terapia, si otros tratamientos no dieron rLarke Un mdico debe recetar los medicamentos. LGratzinterpersonales pueden ser muy importantes para ayudar a su recuperacin. Intente pasar ms tiempo interactuando con amigos y familiares de c[place]oz[place]ique Considere la posibilidad de ir a terapia de pareja, tomar clases de educacin familiar o ir a tCareers information officer La terapia puede ayudarlos a usted y a los dems a comprender mejor el trastorno. Cmo reconocer cambios en el trastorno Todos tienen una respuesta diferente al tratamiento de la ansiedad. Se dice que est recuperado de la ansiedad cuando los sntomas disminuyen y dejan de iCabin crewen las actividades diarias en el hogar o eLa [place]iera Esto podra significar que usted comenzar a hField seismologistlo  de la ansiedad cuando los sntomas disminuyen y dejan de interferir en las actividades diarias en el hogar o el trabajo. Esto podra significar que usted comenzar a hacer lo siguiente:   Tener mejor concentracin y atencin.   Dormir mejor.   Estar menos irritable.   Tener  ms energa.   Tener mejor memoria.    Es importante reconocer cundo el trastorno empeora. Comunquese con el mdico si sus sntomas interfieren en su hogar o su trabajo, y usted no siente que el trastorno est mejorando.  Dnde encontrar ayuda y apoyo:  Puede conseguir ayuda y apoyo en los siguientes lugares:   Grupos de autoayuda.   Organizaciones comunitarias y en lnea.   Un lder espiritual de confianza.   Terapia de pareja.   Clases de educacin familiar.   Terapia familiar.    Siga estas instrucciones en su casa:   Consuma una dieta saludable que incluya abundantes frutas, verduras, cereales integrales, productos lcteos descremados y protenas magras. No consuma muchos alimentos con alto contenido de grasas slidas, azcares agregados o sal.   Actividad fsica. La mayora de los adultos debe hacer lo siguiente:  ? Realizar, al menos, 150minutos de actividad fsica por semana. El ejercicio debe aumentar la frecuencia cardaca y hacerlo transpirar (ejercicio de intensidad moderada).  ? Realizar ejercicios de fortalecimiento por lo menos dos veces por semana.   Disminuir el consumo de cafena, tabaco, alcohol y otras sustancias potencialmente dainas.   Dormir el tiempo adecuado y de la forma correcta. La mayora de los adultos necesitan entre [id number] de sueo todas las noches.   Opte por cosas que le simplifiquen la vida.   Tome los medicamentos de venta libre y los recetados solamente como se lo haya indicado el mdico.   Evite el consumo de cafena, alcohol y ciertos medicamentos contra el resfro de venta sin receta. Estos podran hacerlo sentir peor. Pregntele al farmacutico qu medicamentos no debera tomar.   Concurra a todas las visitas de control como se lo haya indicado el mdico. Esto es importante.  Preguntas para hacerle al mdico   Me ser til la terapia?   Con qu frecuencia debo visitar a un mdico para el seguimiento?   Durante cunto tiempo tendr que tomar los  medicamentos?   Tienen efectos secundarios a largo plazo los medicamentos que tomo?   Existe una alternativa que remplace los medicamentos?  Comunquese con un mdico si:   Le resulta difcil permanecer concentrado o finalizar las tareas diarias.   Pasa muchas horas por da sintindose preocupado por la vida cotidiana.   La preocupacin le provoca un cansancio extremo.   Comienza a tener dolores de cabeza o nuseas, o a sentirse tenso.   Orina ms de lo normal.   Tiene diarrea.  Solicite ayuda de inmediato si:   Se le acelera la frecuencia cardaca y le cuesta respirar.   Tiene pensamientos acerca de lastimarse o daar a otras personas.  Si alguna vez siente que puede lastimarse o lastimar a los dems, o tiene pensamientos de poner fin a su vida, busque ayuda de inmediato. Puede dirigirse al servicio de urgencias ms cercano o comunicarse con:   El servicio de emergencias de su localidad (911 en los [place]).   Una lnea de asistencia al suicida y atencin en crisis, como la Lnea Nacional de Prevencin del Suicidio (National Suicide Prevention Lifeline) al 1-800-273-8255. Est disponible las 24 horas del da.    Resumen   Tomar medidas para enfrentar el estrs
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
## 2017-07-28 NOTE — BH Specialist Note (Signed)
Integrated Behavioral Health Initial Visit  MRN: [id number] Name: Chaniah Cisse [name]  Number of [place] Clinician visits:: 1/6 Session Start time: 3:30 PM  Session End time: 4:00 PM Total time: 30 minutes  Type of Service: [place] Interpretor:Yes.   Interpretor Name and Language: Cristie Hem Spanish ON#[id number]   Warm Hand Off Completed.       SUBJECTIVE: Crystal [name] is a 47 y.o. female accompanied by minor child Patient was referred by Weyman Pedro for anxiety. Patient reports the following symptoms/concerns: overwhelming feelings of sadness and worry, difficulty sleeping, decreased concentration, and irritability Duration of problem: Ongoing; Severity of problem: moderate  OBJECTIVE: Mood: Anxious and Affect: Appropriate Risk of harm to self or others: No plan to harm self or others  LIFE CONTEXT: Family and Social: Pt resides with minor children. She receives limited support School/Work: Pt is employed Self-Care: No report of substance use Life Changes: Pt reports ongoing medical conditions and stress from being primary caregiver to minor children. She reports chronic pain for last 3-4 weeks  GOALS ADDRESSED: Patient will: 1. Reduce symptoms of: anxiety, depression and stress 2. Increase knowledge and/or ability of: coping skills and healthy habits  3. Demonstrate ability to: Increase healthy adjustment to current life circumstances and Increase adequate support systems for patient/family  INTERVENTIONS: Interventions utilized: Solution-Focused Strategies, Supportive Counseling, Psychoeducation and/or Health Education and Link to [employer]  Standardized Assessments completed: GAD-7 and PHQ 2&9  ASSESSMENT: Patient currently experiencing depression and anxiety triggered by ongoing medical concerns and stress. She reports overwhelming feelings of sadness and worry, difficulty sleeping,  decreased concentration, and irritability. Pt receives limited support.   Patient may benefit from psychoeducation, psychotherapy, and medication management. [place] educated pt on correlation between one's physical and mental health, in addition, to how stress negatively impacts health. LCSWA discussed benefits of exercise and self-care strategies to decrease symptoms. Pt was provided financial scholarship application to [employer] to assist with membership costs. Pt was appreciative for information.  PLAN: 1. Follow up with behavioral health clinician on : Pt was encouraged to contact LCSWA if symptoms worsen or fail to improve to schedule behavioral appointments at [facility]. 2. Behavioral recommendations: LCSWA recommends that pt apply healthy coping skills discussed and utilize provided resources. Pt is encouraged to schedule follow up appointment with LCSWA 3. Referral(s): Community Resources:  Finances 4. "From scale of 1-10, how likely are you to follow plan?": 10/10  Rebekah [place], LCSW 07/29/17 4:26 PM

## 2017-07-29 ENCOUNTER — Telehealth: Payer: Self-pay

## 2017-07-29 LAB — HIV ANTIBODY (ROUTINE TESTING W REFLEX): HIV Screen 4th Generation wRfx: NONREACTIVE

## 2017-07-29 NOTE — Telephone Encounter (Signed)
CMA called patient to inform on lab results.  Spanish interpreter [place] [phone] assist with the call.   Patient understood.

## 2017-07-29 NOTE — Telephone Encounter (Signed)
-----   Message from Argentina Donovan, [place] sent at 07/29/2017  3:04 PM EST ----- Please call patient.  Her HIV test was negative.  Thanks, Freeman Caldron, PA-C

## 2017-08-09 ENCOUNTER — Encounter (HOSPITAL_COMMUNITY): Payer: Self-pay | Admitting: *Deleted

## 2017-08-09 ENCOUNTER — Emergency Department (HOSPITAL_COMMUNITY)
Admission: EM | Admit: 2017-08-09 | Discharge: 2017-08-09 | Disposition: A | Discharge status: Home / Self Care | Payer: Self-pay | Attending: Emergency Medicine | Admitting: Emergency Medicine

## 2017-08-09 ENCOUNTER — Emergency Department (HOSPITAL_COMMUNITY): Payer: Self-pay

## 2017-08-09 ENCOUNTER — Other Ambulatory Visit: Payer: Self-pay

## 2017-08-09 DIAGNOSIS — Z79899 Other long term (current) drug therapy: Secondary | ICD-10-CM | POA: Insufficient documentation

## 2017-08-09 DIAGNOSIS — Z853 Personal history of malignant neoplasm of breast: Secondary | ICD-10-CM | POA: Insufficient documentation

## 2017-08-09 DIAGNOSIS — R109 Unspecified abdominal pain: Secondary | ICD-10-CM

## 2017-08-09 DIAGNOSIS — Z8541 Personal history of malignant neoplasm of cervix uteri: Secondary | ICD-10-CM | POA: Insufficient documentation

## 2017-08-09 DIAGNOSIS — R1084 Generalized abdominal pain: Secondary | ICD-10-CM | POA: Insufficient documentation

## 2017-08-09 LAB — COMPREHENSIVE METABOLIC PANEL
ALBUMIN: 4.1 g/dL (ref 3.5–5.0)
ALT: 25 U/L (ref 14–54)
ANION GAP: 8 (ref 5–15)
AST: 23 U/L (ref 15–41)
Alkaline Phosphatase: 60 U/L (ref 38–126)
BILIRUBIN TOTAL: 0.7 mg/dL (ref 0.3–1.2)
BUN: 11 mg/dL (ref 6–20)
CO2: 19 mmol/L — AB (ref 22–32)
Calcium: 9.1 mg/dL (ref 8.9–10.3)
Chloride: 108 mmol/L (ref 101–111)
Creatinine, Ser: 0.76 mg/dL (ref 0.44–1.00)
GFR calc Af Amer: >60 mL/min (ref >60)
GFR calc non Af Amer: >60 mL/min (ref >60)
GLUCOSE: 102 mg/dL — AB (ref 65–99)
POTASSIUM: 4.1 mmol/L (ref 3.5–5.1)
SODIUM: 135 mmol/L (ref 135–145)
TOTAL PROTEIN: 7.7 g/dL (ref 6.5–8.1)

## 2017-08-09 LAB — URINALYSIS, ROUTINE W REFLEX MICROSCOPIC
BILIRUBIN URINE: NEGATIVE
Glucose, UA: NEGATIVE mg/dL
Hgb urine dipstick: NEGATIVE
Ketones, ur: NEGATIVE mg/dL
Leukocytes, UA: NEGATIVE
NITRITE: NEGATIVE
PH: 6 (ref 5.0–8.0)
Protein, ur: NEGATIVE mg/dL
SPECIFIC GRAVITY, URINE: 1.008 (ref 1.005–1.030)

## 2017-08-09 LAB — CBC
HEMATOCRIT: 42.9 % (ref 36.0–46.0)
HEMOGLOBIN: 14.5 g/dL (ref 12.0–15.0)
MCH: 32.7 pg (ref 26.0–34.0)
MCHC: 33.8 g/dL (ref 30.0–36.0)
MCV: 96.8 fL (ref 78.0–100.0)
Platelets: 255 10*3/uL (ref 150–400)
RBC: 4.43 MIL/uL (ref 3.87–5.11)
RDW: 13.3 % (ref 11.5–15.5)
WBC: 7.1 10*3/uL (ref 4.0–10.5)

## 2017-08-09 LAB — LIPASE, BLOOD: LIPASE: 44 U/L (ref 11–51)

## 2017-08-09 MED ORDER — IBUPROFEN 800 MG PO TABS: 800.0000 mg | ORAL_TABLET | Freq: Three times a day (TID) | ORAL | 0 refills | Status: DC | PRN

## 2017-08-09 MED ORDER — SODIUM CHLORIDE 0.9 % IV BOLUS (SEPSIS)
1000.0000 mL | Freq: Once | INTRAVENOUS | Status: AC
Start: 2017-08-09 — End: 2017-08-09
  Administered 2017-08-09: 1000 mL via INTRAVENOUS

## 2017-08-09 MED ORDER — HYDROMORPHONE HCL 1 MG/ML IJ SOLN
1.0000 mg | Freq: Once | INTRAMUSCULAR | Status: AC
  Administered 2017-08-09: 1 mg via INTRAVENOUS
  Filled 2017-08-09: qty 1

## 2017-08-09 MED ORDER — PANTOPRAZOLE SODIUM 20 MG PO TBEC: 20.0000 mg | DELAYED_RELEASE_TABLET | Freq: Every day | ORAL | 0 refills | Status: DC

## 2017-08-09 MED ORDER — DICYCLOMINE HCL 20 MG PO TABS: 20.0000 mg | ORAL_TABLET | Freq: Two times a day (BID) | ORAL | 0 refills | Status: DC

## 2017-08-09 MED ORDER — TRAMADOL HCL 50 MG PO TABS: 50.0000 mg | ORAL_TABLET | Freq: Four times a day (QID) | ORAL | 0 refills | Status: DC | PRN

## 2017-08-09 MED ORDER — IOPAMIDOL (ISOVUE-300) INJECTION 61%
INTRAVENOUS | Status: AC
  Administered 2017-08-09: 100 mL
  Filled 2017-08-09: qty 100

## 2017-08-09 MED ORDER — ONDANSETRON HCL 4 MG/2ML IJ SOLN
4.0000 mg | Freq: Once | INTRAMUSCULAR | Status: AC
  Administered 2017-08-09: 4 mg via INTRAVENOUS
  Filled 2017-08-09: qty 2

## 2017-08-09 MED ORDER — IOPAMIDOL (ISOVUE-300) INJECTION 61%
INTRAVENOUS | Status: AC
  Filled 2017-08-09: qty 30

## 2017-08-09 NOTE — Discharge Instructions (Signed)

## 2017-08-09 NOTE — ED Notes (Signed)
Pt verbalized understanding discharge instructions and denies any further needs or questions at this time. VS stable, ambulatory and steady gait.   

## 2017-08-09 NOTE — ED Notes (Signed)
Patient transported to Ultrasound 

## 2017-08-09 NOTE — ED Provider Notes (Signed)
Blood pressure (!) 112/57, pulse 66, temperature 97.7 F (36.5 C), temperature source Oral, resp. rate 16, last menstrual period 06/07/2015, SpO2 100 %.  Assuming care from Dr. Ashok Cordia.  In short, Crystal [name] is a 47 y.o. female with a chief complaint of Flank Pain and Abdominal Pain .  Refer to the original H&P for additional details.  The current plan of care is to follow CT and reassess.  05:07 PM CT scan reviewed which shows some inflammation along the transverse colon.  The patient does not have symptoms to suggest colitis other than pain such as diarrhea, fevers, blood in the stool.  She does not have tenderness to palpation along the transverse colon on my exam.  Most of her pain is in the right lower back and radiating to the shoulder.  I do not feel she would benefit from antibiotics at this time.  Plan for further pain control measures and follow-up with her primary care physician.  I did discuss her renal cyst on CT.  Also discussed the need for possible lower endoscopy through gastroenterology if her symptoms continue to further evaluate this thickening.  I will provide gastroenterology contact information at discharge but she will likely need a referral from her PCP.  She plans to call them tomorrow.   Nanda Quinton, MD   Margette Fast, MD 08/09/17 [id number]

## 2017-08-09 NOTE — ED Provider Notes (Signed)
[facility] EMERGENCY DEPARTMENT Provider Note   CSN: [id number] Arrival date & time: 08/09/17  0847     History   Chief Complaint Chief Complaint  Patient presents with  . Flank Pain  . Abdominal Pain    HPI Crystal [name] is a 47 y.o. female.  Patient c/o ruq pain for the past 3-4 weeks. Pain constant, dull, moderate-severe, without specific exacerbating or alleviating factors. Pain occasionally radiates to back or shoulder area. No hx gallstones. No trauma to area. Denies vomiting or diarrhea. No dysuria or hematuria. No fever or chills.    The history is provided by the patient and a relative.  Flank Pain  Associated symptoms include abdominal pain. Pertinent negatives include no chest pain, no headaches and no shortness of breath.  Abdominal Pain   Associated symptoms include nausea. Pertinent negatives include fever and headaches.    Past Medical History:  Diagnosis Date  . Cancer ([place])    cervical cancer dx.   . History of kidney stones    x1   . Kidney stones    cyst on kidney -no problemsm hx of kidney stones  . 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 PONV (postoperative nausea and vomiting)     Patient Active Problem List   Diagnosis Date Noted  . Chest pain 07/28/2017  . Hemoptysis 03/04/2017  . Gastritis 12/06/2016  . Herpes zoster without complication 10/62/6948  . Depression 04/07/2016  . 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 Urinary retention with incomplete bladder emptying 07/23/2015  . Post-operative nausea and vomiting 07/08/2015  . Cervical cancer ([place]) 07/01/2015  . Malignant neoplasm of cervix ([place]) 06/02/2015  . 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 Renal mass, right 04/14/2015  . Periodic health assessment, general screening, adult 02/23/2013    Past Surgical History:  Procedure Laterality Date  . BREAST BIOPSY Left 08/28/2015   MRI-  High Risk  . BREAST BIOPSY Right 08/28/2015   MRI- Benign  . BREAST EXCISIONAL BIOPSY Left 10/03/2015  . BREAST LUMPECTOMY WITH RADIOACTIVE SEED LOCALIZATION Left 10/03/2015   Procedure: LEFT BREAST LUMPECTOMY WITH RADIOACTIVE SEED LOCALIZATION;  Surgeon: Fanny Skates, MD;  Location: [facility];  Service: General;  Laterality: Left;  . CERVICAL BIOPSY  W/ LOOP ELECTRODE EXCISION     12'16  . CESAREAN SECTION     2 previous  . ROBOTIC ASSISTED TOTAL HYSTERECTOMY Bilateral 07/01/2015   Procedure: XI ROBOTIC ASSISTED RADICAL TYPE II HYSTERECTOMY, BILATERAL SALPINGECTOMY, SENTINAL LYMPH NODE BIOPSY;  Surgeon: Everitt Amber, MD;  Location: [facility];  Service: Gynecology;  Laterality: Bilateral;    OB History    Gravida Para Term Preterm AB Living   2 2 2     2    SAB TAB Ectopic Multiple Live Births                   Home Medications    Prior to Admission medications   Medication Sig Start Date End Date Taking? Authorizing Provider  acetaminophen (TYLENOL) 500 MG tablet Take 500 mg by mouth every 6 (six) hours as needed.    [provider]  diclofenac sodium (VOLTAREN) 1 % GEL Apply 4 g 2 (two) times daily as needed topically. 04/07/17   Charlott Rakes, MD  ibuprofen (ADVIL,MOTRIN) 600 MG tablet Take 1 tablet (600 mg total) by mouth every 6 (six) hours as needed. 07/28/17  Freeman Caldron M, PA-C  linaclotide (LINZESS) 72 MCG capsule Take 1 capsule (72 mcg total) by mouth daily before breakfast. Patient not taking: Reported on 07/28/2017 04/19/17   Mauri Pole, MD  methocarbamol (ROBAXIN) 500 MG tablet Take 1 tablet (500 mg total) by mouth every 8 (eight) hours as needed for muscle spasms. 07/28/17   Argentina Donovan, PA-C  Misc Natural Products [facility]) CAPS Take 1 tablet by mouth at bedtime.    [provider]  NON FORMULARY OTC colon cleanse    [provider]  omeprazole (PRILOSEC) 20 MG capsule Take 1 capsule (20 mg total) by mouth  daily. Patient not taking: Reported on 05/12/2017 04/19/17   Mauri Pole, MD  Probiotic Product (PROBIOTIC COLON SUPPORT PO) Take 1 tablet by mouth daily.    [provider]    Family History Family History  Problem Relation Age of Onset  . Hypertension Sister   . Colon cancer Neg Hx   . Esophageal cancer Neg Hx   . Rectal cancer Neg Hx   . Stomach cancer Neg Hx     Social History Social History   Tobacco Use  . Smoking status: Never Smoker  . Smokeless tobacco: Never Used  Substance Use Topics  . Alcohol use: Yes    Comment: occasionally  . Drug use: No     Allergies   Patient has no known allergies.   Review of Systems Review of Systems  Constitutional: Negative for fever.  HENT: Negative for sore throat.   Eyes: Negative for redness.  Respiratory: Negative for shortness of breath.   Cardiovascular: Negative for chest pain.  Gastrointestinal: Positive for abdominal pain and nausea.  Genitourinary: Positive for flank pain.  Musculoskeletal: Negative for neck pain.  Skin: Negative for rash.  Neurological: Negative for headaches.  Hematological: Does not bruise/bleed easily.  Psychiatric/Behavioral: Negative for confusion.     Physical Exam Updated Vital Signs BP (!) 104/53 (BP Location: Right Arm)   Pulse 74   Temp 97.7 F (36.5 C) (Oral)   Resp 16   LMP 06/07/2015 (Exact Date)   SpO2 100%   Physical Exam  Constitutional: She appears well-developed and well-nourished. No distress.  HENT:  Mouth/Throat: Oropharynx is clear and moist.  Eyes: Conjunctivae are normal. No scleral icterus.  Neck: Neck supple. No tracheal deviation present.  Cardiovascular: Normal rate, regular rhythm, normal heart sounds and intact distal pulses. Exam reveals no gallop and no friction rub.  No murmur heard. Pulmonary/Chest: Effort normal and breath sounds normal. No respiratory distress.  Abdominal: Soft. Normal appearance and bowel sounds are normal. She  exhibits no distension and no mass. There is tenderness. There is no rebound and no guarding. No hernia.  ruq tenderness  Genitourinary:  Genitourinary Comments: No cva tenderness  Musculoskeletal: She exhibits no edema.  Neurological: She is alert.  Skin: Skin is warm and dry. No rash noted. She is not diaphoretic.  Psychiatric: She has a normal mood and affect.  Nursing note and vitals reviewed.    ED Treatments / Results  Labs (all labs ordered are listed, but only abnormal results are displayed) Results for orders placed or performed during the hospital encounter of 08/09/17  Lipase, blood  Result Value Ref Range   Lipase 44 11 - 51 U/L  Comprehensive metabolic panel  Result Value Ref Range   Sodium 135 135 - 145 mmol/L   Potassium 4.1 3.5 - 5.1 mmol/L   Chloride 108 101 - 111 mmol/L  CO2 19 (L) 22 - 32 mmol/L   Glucose, Bld 102 (H) 65 - 99 mg/dL   BUN 11 6 - 20 mg/dL   Creatinine, Ser 0.76 0.44 - 1.00 mg/dL   Calcium 9.1 8.9 - 10.3 mg/dL   Total Protein 7.7 6.5 - 8.1 g/dL   Albumin 4.1 3.5 - 5.0 g/dL   AST 23 15 - 41 U/L   ALT 25 14 - 54 U/L   Alkaline Phosphatase 60 38 - 126 U/L   Total Bilirubin 0.7 0.3 - 1.2 mg/dL   GFR calc non Af Amer >60 >60 mL/min   GFR calc Af Amer >60 >60 mL/min   Anion gap 8 5 - 15  CBC  Result Value Ref Range   WBC 7.1 4.0 - 10.5 K/uL   RBC 4.43 3.87 - 5.11 MIL/uL   Hemoglobin 14.5 12.0 - 15.0 g/dL   HCT 42.9 36.0 - 46.0 %   MCV 96.8 78.0 - 100.0 fL   MCH 32.7 26.0 - 34.0 pg   MCHC 33.8 30.0 - 36.0 g/dL   RDW 13.3 11.5 - 15.5 %   Platelets 255 150 - 400 K/uL  Urinalysis, Routine w reflex microscopic  Result Value Ref Range   Color, Urine YELLOW YELLOW   APPearance CLEAR CLEAR   Specific Gravity, Urine 1.008 1.005 - 1.030   pH 6.0 5.0 - 8.0   Glucose, UA NEGATIVE NEGATIVE mg/dL   Hgb urine dipstick NEGATIVE NEGATIVE   Bilirubin Urine NEGATIVE NEGATIVE   Ketones, ur NEGATIVE NEGATIVE mg/dL   Protein, ur NEGATIVE NEGATIVE mg/dL    Nitrite NEGATIVE NEGATIVE   Leukocytes, UA NEGATIVE NEGATIVE    EKG  EKG Interpretation None       Radiology US Abdomen Limited  Result Date: 08/09/2017 CLINICAL DATA:  Right upper quadrant pain EXAM: ULTRASOUND ABDOMEN LIMITED RIGHT UPPER QUADRANT COMPARISON:  CT abdomen and pelvis May 22, 2016 FINDINGS: Gallbladder: No gallstones or wall thickening visualized. There is no pericholecystic fluid. No sonographic Murphy sign noted by sonographer. Common bile duct: Diameter: 2 mm. No intrahepatic or extrahepatic biliary duct dilatation. Liver: No focal lesion identified. Within normal limits in parenchymal echogenicity. Portal vein is patent on color Doppler imaging with normal direction of blood flow towards the liver. IMPRESSION: Study within normal limits. Electronically Signed   By: Lowella Grip III M.D.   On: 08/09/2017 13:26    Procedures Procedures (including critical care time)  Medications Ordered in ED Medications  HYDROmorphone (DILAUDID) injection 1 mg (not administered)  ondansetron (ZOFRAN) injection 4 mg (not administered)  sodium chloride 0.9 % bolus 1,000 mL (not administered)     Initial Impression / Assessment and Plan / ED Course  I have reviewed the triage vital signs and the nursing notes.  Pertinent labs & imaging results that were available during my care of the patient were reviewed by me and considered in my medical decision making (see chart for details).  Labs sent.  Iv ns bolus. Dilaudid 1 mg iv for pain. zofran iv for nausea.  Labs reviewed - lipase and lfts normal.   U/s is pending.   Reviewed nursing notes and prior charts for additional history.   U/s neg acute. Pain and abd tenderness persists.   Will get ct.  1530, CT pending - signed out to Dr Laverta Baltimore to check CT result and recheck pt.   Final Clinical Impressions(s) / ED Diagnoses   Final diagnoses:  None    ED Discharge Orders  None       Lajean Saver,  MD 08/09/17 1530

## 2017-08-09 NOTE — ED Triage Notes (Signed)
Pt reports having right flank pain for weeks. Pain radiates into RUQ. Hx of kidney stones. Pain has become more severe over past week and now has neck and right shoulder pain. Reports mild nausea, no vomiting.

## 2017-08-12 ENCOUNTER — Ambulatory Visit: Payer: Self-pay | Attending: Family Medicine | Admitting: Family Medicine

## 2017-08-12 ENCOUNTER — Encounter: Payer: Self-pay | Admitting: Family Medicine

## 2017-08-12 VITALS — BP 111/65 | HR 76 | Temp 98.0°F | Ht <= 58 in | Wt 117.6 lb

## 2017-08-12 DIAGNOSIS — R109 Unspecified abdominal pain: Secondary | ICD-10-CM | POA: Insufficient documentation

## 2017-08-12 DIAGNOSIS — M549 Dorsalgia, unspecified: Secondary | ICD-10-CM | POA: Insufficient documentation

## 2017-08-12 DIAGNOSIS — Z87442 Personal history of urinary calculi: Secondary | ICD-10-CM | POA: Insufficient documentation

## 2017-08-12 DIAGNOSIS — M940 Chondrocostal junction syndrome [Tietze]: Secondary | ICD-10-CM | POA: Insufficient documentation

## 2017-08-12 DIAGNOSIS — N281 Cyst of kidney, acquired: Secondary | ICD-10-CM | POA: Insufficient documentation

## 2017-08-12 DIAGNOSIS — K219 Gastro-esophageal reflux disease without esophagitis: Secondary | ICD-10-CM | POA: Insufficient documentation

## 2017-08-12 DIAGNOSIS — M62838 Other muscle spasm: Secondary | ICD-10-CM | POA: Insufficient documentation

## 2017-08-12 DIAGNOSIS — Z853 Personal history of malignant neoplasm of breast: Secondary | ICD-10-CM | POA: Insufficient documentation

## 2017-08-12 DIAGNOSIS — Z8541 Personal history of malignant neoplasm of cervix uteri: Secondary | ICD-10-CM | POA: Insufficient documentation

## 2017-08-12 DIAGNOSIS — Z90710 Acquired absence of both cervix and uterus: Secondary | ICD-10-CM | POA: Insufficient documentation

## 2017-08-12 DIAGNOSIS — Z79899 Other long term (current) drug therapy: Secondary | ICD-10-CM | POA: Insufficient documentation

## 2017-08-12 DIAGNOSIS — Z9889 Other specified postprocedural states: Secondary | ICD-10-CM | POA: Insufficient documentation

## 2017-08-12 MED ORDER — TIZANIDINE HCL 4 MG PO TABS: 4.0000 mg | ORAL_TABLET | Freq: Three times a day (TID) | ORAL | 1 refills | Status: DC | PRN

## 2017-08-12 MED ORDER — LIDOCAINE 5 % EX PTCH: 1.0000 | MEDICATED_PATCH | CUTANEOUS | 0 refills | Status: DC

## 2017-08-12 MED ORDER — METHOCARBAMOL 500 MG PO TABS: 500.0000 mg | ORAL_TABLET | Freq: Three times a day (TID) | ORAL | 1 refills | Status: DC | PRN

## 2017-08-12 NOTE — Patient Instructions (Addendum)
Costocondritis Costochondritis La costocondritis es la hinchazn e irritacin (inflamacin) del tejido (cartlago) que une las costillas con el esternn. Esto causa dolor en la parte frontal del pecho. Por lo general, el dolor tiene las siguientes caractersticas:  Comienza de forma gradual.  Se manifiesta en ms de una costilla.  Generalmente, esta afeccin desaparece con el paso tiempo, sin tratamiento. Siga estas indicaciones en su casa:  No haga nada que intensifique el dolor.  Si se lo indican, aplique hielo sobre la zona del dolor: ? Ponga el hielo en una bolsa plstica. ? Coloque una toalla entre la piel y la bolsa de hielo. ? Coloque el hielo durante 20minutos, 2 o 3veces por da.  Si se lo indican, aplique calor en la zona afectada con la frecuencia que le indique el mdico. Use la fuente de calor que el mdico le indique, por ejemplo, una compresa de calor hmedo o una almohadilla trmica. ? Colquese una toalla entre la piel y la fuente de calor. ? Aplique el calor durante 20 a 30minutos. ? Retire la fuente de calor si la piel se le pone de color rojo brillante. Esto es muy importante si no puede sentir el dolor, el calor o el fro. Puede correr un riesgo mayor de sufrir quemaduras.  Tome los medicamentos de venta libre y los recetados solamente como se lo haya indicado el mdico.  Retome sus actividades habituales como se lo haya indicado el mdico. Pregntele al mdico qu actividades son seguras para usted.  Concurra a todas las visitas de control como se lo haya indicado el mdico. Esto es importante. Comunquese con un mdico si:  Tiene escalofros o fiebre.  El dolor persiste o empeora.  Tiene tos que no desaparece. Solicite ayuda de inmediato si:  Le falta el aire. Esta informacin no tiene como fin reemplazar el consejo del mdico. Asegrese de hacerle al mdico cualquier pregunta que tenga. Document Released: 06/12/2010 Document Revised: 08/12/2016  Document Reviewed: 09/03/2015 Elsevier Interactive Patient Education  2018 [employer]vier Inc[employer]  

## 2017-08-12 NOTE — Progress Notes (Signed)
Subjective:  Patient ID: Crystal [name], female    DOB: 21-Dec-1970  Age: 47 y.o. MRN: [id number]  CC: Flank Pain and Back Pain   HPI Crystal [name] is a 47 year old woman with a history of GERD, Stage 1B cervical cancer status post robotic-assisted type III radical laparoscopic hysterectomy with bilateral salpingectomy and bilateral sentinel lymph node biopsy in 06/2015 (no residual carcinoma as per GYN ) here for a follow-up visit.  She complains of pain on the right side of her neck, her shoulder blades of the right side and beneath her right upper extremity which is described as moderate.  Denies history of trauma. Also complains of right chest wall pain above the right breast and had been referred for mammogram at her visit with the PA but informs me she has been unable to get in touch with [facility] to schedule this. She had an ED visit for flank pain 3 days ago and was prescribed tramadol and ibuprofen and PPI.  CT abdomen and pelvis was positive for a 9 mm simple right upper pole renal cyst  CT abdomen and pelvis with contrast: IMPRESSION: Mild patchy thick wall transverse colon in a partially decompressed bladder. This may be due to infectious/inflammatory etiology.  Past Medical History:  Diagnosis Date  . Cancer ([place])    cervical cancer dx.   . History of kidney stones    x1   . Kidney stones    cyst on kidney -no problemsm hx of kidney stones  . 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 PONV (postoperative nausea and vomiting)     Past Surgical History:  Procedure Laterality Date  . BREAST BIOPSY Left 08/28/2015   MRI- High Risk  . BREAST BIOPSY Right 08/28/2015   MRI- Benign  . BREAST EXCISIONAL BIOPSY Left 10/03/2015  . BREAST LUMPECTOMY WITH RADIOACTIVE SEED LOCALIZATION Left 10/03/2015   Procedure: LEFT BREAST LUMPECTOMY WITH RADIOACTIVE SEED LOCALIZATION;  Surgeon: Fanny Skates, MD;  Location: [facility];  Service: General;  Laterality: Left;  . CERVICAL BIOPSY  W/ LOOP ELECTRODE EXCISION     12'16  . CESAREAN SECTION     2 previous  . ROBOTIC ASSISTED TOTAL HYSTERECTOMY Bilateral 07/01/2015   Procedure: XI ROBOTIC ASSISTED RADICAL TYPE II HYSTERECTOMY, BILATERAL SALPINGECTOMY, SENTINAL LYMPH NODE BIOPSY;  Surgeon: Everitt Amber, MD;  Location: [facility];  Service: Gynecology;  Laterality: Bilateral;    No Known Allergies   Outpatient Medications Prior to Visit  Medication Sig Dispense Refill  . dicyclomine (BENTYL) 20 MG tablet Take 1 tablet (20 mg total) by mouth 2 (two) times daily. 20 tablet 0  . ibuprofen (ADVIL,MOTRIN) 800 MG tablet Take 1 tablet (800 mg total) by mouth every 8 (eight) hours as needed. 21 tablet 0  . OVER THE COUNTER MEDICATION Take 15 mLs by mouth 2 (two) times daily. Simifibra Forte 300 g  supplement     . pantoprazole (PROTONIX) 20 MG tablet Take 1 tablet (20 mg total) by mouth daily. 30 tablet 0  . Probiotic Product (PROBIOTIC COLON SUPPORT PO) Take 1 tablet by mouth daily.    . traMADol (ULTRAM) 50 MG tablet Take 1 tablet (50 mg total) by mouth every 6 (six) hours as needed. 15 tablet 0  . diclofenac sodium (VOLTAREN) 1 % GEL Apply 4 g 2 (two) times daily as needed topically. (Patient not taking: Reported on 08/09/2017) 100 g 1  . linaclotide (LINZESS) 72  MCG capsule Take 1 capsule (72 mcg total) by mouth daily before breakfast. (Patient not taking: Reported on 07/28/2017) 30 capsule 3  . omeprazole (PRILOSEC) 20 MG capsule Take 1 capsule (20 mg total) by mouth daily. (Patient not taking: Reported on 05/12/2017) 90 capsule 3   Facility-Administered Medications Prior to Visit  Medication Dose Route Frequency Provider Last Rate Last Dose  . 0.9 %  sodium chloride infusion  500 mL Intravenous Once Nandigam, Kavitha V, MD        ROS Review of Systems  Constitutional: Negative for activity change, appetite change and fatigue.  HENT: Negative for congestion,  sinus pressure and sore throat.   Eyes: Negative for visual disturbance.  Respiratory: Negative for cough, chest tightness, shortness of breath and wheezing.   Cardiovascular: Negative for chest pain and palpitations.  Gastrointestinal: Negative for abdominal distention, abdominal pain and constipation.  Endocrine: Negative for polydipsia.  Genitourinary: Negative for dysuria and frequency.  Musculoskeletal:       See hpi  Skin: Negative for rash.  Neurological: Negative for tremors, light-headedness and numbness.  Hematological: Does not bruise/bleed easily.  Psychiatric/Behavioral: Negative for agitation and behavioral problems.    Objective:  BP 111/65   Pulse 76   Temp 98 F (36.7 C) (Oral)   Ht 4\' 10"  (1.473 m)   Wt 117 lb 9.6 oz (53.3 kg)   LMP 06/07/2015 (Exact Date)   SpO2 97%   BMI 24.58 kg/m   BP/Weight 08/12/2017 3/55/7322 0/06/5425  Systolic BP 062 376 283  Diastolic BP 65 54 71  Wt. (Lbs) 117.6 - 116  BMI 24.58 - 24.24      Physical Exam  Constitutional: She is oriented to person, place, and time. She appears well-developed and well-nourished.  Cardiovascular: Normal rate, normal heart sounds and intact distal pulses.  No murmur heard. Pulmonary/Chest: Effort normal and breath sounds normal. She has no wheezes. She has no rales. She exhibits tenderness (Reproducible R chest wall tenderness).  Abdominal: Soft. Bowel sounds are normal. She exhibits no distension and no mass. There is no tenderness.  Musculoskeletal: Normal range of motion. She exhibits tenderness (TTP of right sternocleidomastoid ).  Neurological: She is alert and oriented to person, place, and time.     Assessment & Plan:   1. Muscle spasm Advised to apply heat as pain is likely musculoskeletal. - lidocaine (LIDODERM) 5 %; Place 1 patch onto the skin daily. Remove & Discard patch within 12 hours or as directed by MD  Dispense: 30 patch; Refill: 0 - tiZANidine (ZANAFLEX) 4 MG tablet; Take  1 tablet (4 mg total) by mouth every 8 (eight) hours as needed for muscle spasms.  Dispense: 90 tablet; Refill: 1  2. Costochondritis Currently on ibuprofen   Meds ordered this encounter  Medications  . DISCONTD: methocarbamol (ROBAXIN) 500 MG tablet    Sig: Take 1 tablet (500 mg total) by mouth every 8 (eight) hours as needed for muscle spasms.    Dispense:  90 tablet    Refill:  1  . lidocaine (LIDODERM) 5 %    Sig: Place 1 patch onto the skin daily. Remove & Discard patch within 12 hours or as directed by MD    Dispense:  30 patch    Refill:  0  . tiZANidine (ZANAFLEX) 4 MG tablet    Sig: Take 1 tablet (4 mg total) by mouth every 8 (eight) hours as needed for muscle spasms.    Dispense:  90 tablet  Refill:  1    Discontinue methocarbamol    Follow-up: Return in about 1 month (around 09/12/2017) for follow up of chronic medical conditions.   Charlott Rakes MD

## 2017-08-26 ENCOUNTER — Other Ambulatory Visit (HOSPITAL_COMMUNITY)
Admission: RE | Admit: 2017-08-26 | Discharge: 2017-08-26 | Disposition: A | Discharge status: Home / Self Care | Payer: Self-pay | Source: Ambulatory Visit | Attending: Gynecologic Oncology | Admitting: Gynecologic Oncology

## 2017-08-26 ENCOUNTER — Encounter: Payer: Self-pay | Admitting: Gynecologic Oncology

## 2017-08-26 ENCOUNTER — Inpatient Hospital Stay: Payer: Self-pay | Attending: Gynecologic Oncology | Admitting: Gynecologic Oncology

## 2017-08-26 VITALS — BP 115/59 | HR 100 | Temp 97.7°F | Resp 20 | Wt 117.0 lb

## 2017-08-26 DIAGNOSIS — Z90710 Acquired absence of both cervix and uterus: Secondary | ICD-10-CM | POA: Insufficient documentation

## 2017-08-26 DIAGNOSIS — Z8541 Personal history of malignant neoplasm of cervix uteri: Secondary | ICD-10-CM | POA: Insufficient documentation

## 2017-08-26 DIAGNOSIS — Z90722 Acquired absence of ovaries, bilateral: Secondary | ICD-10-CM | POA: Insufficient documentation

## 2017-08-26 DIAGNOSIS — R102 Pelvic and perineal pain: Secondary | ICD-10-CM | POA: Insufficient documentation

## 2017-08-26 DIAGNOSIS — D4959 Neoplasm of unspecified behavior of other genitourinary organ: Secondary | ICD-10-CM

## 2017-08-26 MED FILL — ?TIZANIDINE HCL 4MG TABLETS: 4 | 30 days supply | Qty: 90 | Fill #0

## 2017-08-26 MED FILL — ?LIDOCAINE 5% PATCH: 5 | 30 days supply | Qty: 30 | Fill #0

## 2017-08-26 NOTE — Progress Notes (Signed)
Follow Up Note: Gyn-Onc  Crystal [name] 47 y.o. female  CC:  Chief Complaint  Patient presents with  . Cervix neoplasm   Assessment/Plan:  47 year old s/p robotic-assisted type III radical laparoscopic hysterectomy with bilateral salpingectomy and bilateral sentinel lymph node biopsy on 07/01/15 for stage IB1 cervical SCC.  No residual carcinoma on hysterectomy specimen. Low risk features to her tumor therefore no adjuvant therapy recommended. No evidence for recurrence on today's exam. Pelvic pain bilaterally - unclear etiology on exam  1/ we will continue annual pap surveillance for this with hpv cotesting (next due in March, 2020).  2/ I discussed symptoms concerning for recurrence including vaginal bleeding or discharge, pelvic pain, cough, or lower extremity edema.  3/ Follow-up for surveillance 6 monthly.   4/ pelvic pain - pelvic US  HPI: Crystal [name] is a 47 year old female initially referred by Dr. 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  She had her first abnormal Pap smear on 02/26/2015 which was HGSIL. She then underwent colposcopic evaluation of the cervix on 04/02/2015, which was benign in appearance. A biopsy from the ectocervix was benign and endocervical curetting showed CIN-3. As follow-up, she underwent a LEEP procedure on 05/12/2015. 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 Dimensions of the carcinoma were not included however there was a focus of dysplasia suspicious for invasive carcinoma involving the deep margin.  PET on 06/20/15: IMPRESSION: 1. No primary hypermetabolic primary cervical mass or evidence of nodal metastasis. 2. Focal hypermetabolism in the lateral left breast. No CT correlate. Consider correlation with diagnostic mammography and ultrasound to exclude otherwise occult breast lesion. 3. Probable left nephrolithiasis  On  07/01/15, she underwent a robotic-assisted type III radical laparoscopic hysterectomy with bilateral salpingoophorectomy and bilateral pelvic lymphadenectomy by Dr. Denman George.  Final pathology revealed: Diagnosis 1. Uterus +/- tubes/ovaries, neoplastic, cervix CERVIX WITH LOW GRADE SQUAMOUS INTRAEPITHELIAL LESION, CIN-1 NO RESIDUAL HIGH GRADE INTRAEPITHELIAL LESION OR INVASIVE NEOPLASM IDENTIFIED VAGINAL CUFF, PARAMETRIAL AND ALL RESECTION MARGINS ARE NEGATIVE FOR TUMOR ENDOMETRIAL POLYP AND INACTIVE ENDOMETRIUM MYOMETRIUM: NO PATHOLOGICAL ALTERATIONS BILATERAL FALLOPIAN TUBES: HISTOLOGICAL UNREMARKABLE 2. Lymph node, sentinel, biopsy, right external iliac ONE BENIGN LYMPH NODE (0/1) 3. Lymph node, sentinel, biopsy, left obturator ONE BENIGN LYMPH NODE (0/1)  She was re-admitted to the hospital on POD 7, 07/08/15, for persistent nausea, vomiting, dehydration, no BM, dyspnea.  CT CAP on 07/08/15 failed to demonstrate evidence for intraperitoneal abscess/GU or GI injury or pathology including no obstruction. UA and culture revealed a catheter associated UTI (e coli - pan sensitive) which was treated with cipro. She was discharged home on 07/10/15 after IV hydration, aggressive bowel regimen.  She failed a voiding trial during her hospitalization and the foley had to be replaced due to urinary retention.  She presented to the office on 07/15/15 for a repeat voiding trial. She passed this well and her foley was left out, however she subsequently developed urinary retention and required teaching to intermittently self catheterize.  On 10/03/2015 she underwent a breast lumpectomy on the left which confirmed lobular hyperplasia but no carcinoma in situ identified. No additional therapy is required for this.  Interval History:   She denies vaginal bleeding, LE edema.   She was worked up for epigastric pain by GI in December, 2018 with an EGD which confirmed GERD.  She had CT abd/pelvis in March, 2019 for  flank pain which showed  no evidence of recurrent cancer.   She has intermittent pelvic pains. Bilateral. Unclear relieving or exacerbating. Patient feels they are her ovaries.  Review of Systems  Constitutional: Feels well.  No fever, chills, early satiety, or unintentional weight loss or gain.  Cardiovascular: No chest pain, shortness of breath, or edema.  Pulmonary: coughs up blood Gastrointestinal: No nausea, vomiting, or diarrhea. No bright red blood per rectum or change in bowel movement. + spits up blood intermittently Genitourinary: Minimal bladder sensation. No vaginal bleeding or discharge.  Musculoskeletal: No myalgia or joint pain. Neurologic: No weakness, numbness, or change in gait.  Psychology: No depression, anxiety, or insomnia.  Current Meds:  Outpatient Encounter Medications as of 08/26/2017  Medication Sig  . amoxicillin (AMOXIL) 500 MG capsule TAKE ONE CAPSULE BY MOUTH THREE TIMES DAILY UNTIL GONE  . linaclotide (LINZESS) 72 MCG capsule Take 1 capsule (72 mcg total) by mouth daily before breakfast.  . lidocaine (LIDODERM) 5 % Place 1 patch onto the skin daily. Remove & Discard patch within 12 hours or as directed by MD (Patient not taking: Reported on 08/26/2017)  . pantoprazole (PROTONIX) 20 MG tablet Take 1 tablet (20 mg total) by mouth daily. (Patient not taking: Reported on 08/26/2017)  . tiZANidine (ZANAFLEX) 4 MG tablet Take 1 tablet (4 mg total) by mouth every 8 (eight) hours as needed for muscle spasms. (Patient not taking: Reported on 08/26/2017)  . [DISCONTINUED] diclofenac sodium (VOLTAREN) 1 % GEL Apply 4 g 2 (two) times daily as needed topically. (Patient not taking: Reported on 08/09/2017)  . [DISCONTINUED] dicyclomine (BENTYL) 20 MG tablet Take 1 tablet (20 mg total) by mouth 2 (two) times daily.  . [DISCONTINUED] ibuprofen (ADVIL,MOTRIN) 800 MG tablet Take 1 tablet (800 mg total) by mouth every 8 (eight) hours as needed.  . [DISCONTINUED] omeprazole (PRILOSEC) 20  MG capsule Take 1 capsule (20 mg total) by mouth daily. (Patient not taking: Reported on 05/12/2017)  . [DISCONTINUED] OVER THE COUNTER MEDICATION Take 15 mLs by mouth 2 (two) times daily. Simifibra Forte 300 g  supplement   . [DISCONTINUED] Probiotic Product (PROBIOTIC COLON SUPPORT PO) Take 1 tablet by mouth daily.  . [DISCONTINUED] traMADol (ULTRAM) 50 MG tablet Take 1 tablet (50 mg total) by mouth every 6 (six) hours as needed.   Facility-Administered Encounter Medications as of 08/26/2017  Medication  . 0.9 %  sodium chloride infusion    Allergy: No Known Allergies  Social Hx:   Social History   Socioeconomic History  . Marital status: Widowed    Spouse name: Not on file  . Number of children: 2  . Years of education: Not on file  . Highest education level: Not on file  Occupational History  . Not on file  Social Needs  . Financial resource strain: Not on file  . Food insecurity:    Worry: Not on file    Inability: Not on file  . Transportation needs:    Medical: Not on file    Non-medical: Not on file  Tobacco Use  . Smoking status: Never Smoker  . Smokeless tobacco: Never Used  Substance and Sexual Activity  . Alcohol use: Yes    Comment: occasionally  . Drug use: No  . Sexual activity: Yes    Birth control/protection: Surgical  Lifestyle  . Physical activity:    Days per week: Not on file    Minutes per session: Not on file  . Stress: Not on file  Relationships  . Social  connections:    Talks on phone: Not on file    Gets together: Not on file    Attends religious service: Not on file    Active member of club or organization: Not on file    Attends meetings of clubs or organizations: Not on file    Relationship status: Not on file  . Intimate partner violence:    Fear of current or ex partner: Not on file    Emotionally abused: Not on file    Physically abused: Not on file    Forced sexual activity: Not on file  Other Topics Concern  . Not on file   Social History Narrative  . Not on file    Past Surgical Hx:  Past Surgical History:  Procedure Laterality Date  . BREAST BIOPSY Left 08/28/2015   MRI- High Risk  . BREAST BIOPSY Right 08/28/2015   MRI- Benign  . BREAST EXCISIONAL BIOPSY Left 10/03/2015  . BREAST LUMPECTOMY WITH RADIOACTIVE SEED LOCALIZATION Left 10/03/2015   Procedure: LEFT BREAST LUMPECTOMY WITH RADIOACTIVE SEED LOCALIZATION;  Surgeon: Fanny Skates, MD;  Location: [facility];  Service: General;  Laterality: Left;  . CERVICAL BIOPSY  W/ LOOP ELECTRODE EXCISION     12'16  . CESAREAN SECTION     2 previous  . ROBOTIC ASSISTED TOTAL HYSTERECTOMY Bilateral 07/01/2015   Procedure: XI ROBOTIC ASSISTED RADICAL TYPE II HYSTERECTOMY, BILATERAL SALPINGECTOMY, SENTINAL LYMPH NODE BIOPSY;  Surgeon: Everitt Amber, MD;  Location: [facility];  Service: Gynecology;  Laterality: Bilateral;    Past Medical Hx:  Past Medical History:  Diagnosis Date  . Cancer ([place])    cervical cancer dx.   . History of kidney stones    x1   . Kidney stones    cyst on kidney -no problemsm hx of kidney stones  . 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 PONV (postoperative nausea and vomiting)     Family Hx:  Family History  Problem Relation Age of Onset  . Hypertension Sister   . Colon cancer Neg Hx   . Esophageal cancer Neg Hx   . Rectal cancer Neg Hx   . Stomach cancer Neg Hx     Vitals:  Blood pressure (!) 115/59, pulse 100, temperature 97.7 F (36.5 C), temperature source Oral, resp. rate 20, weight 117 lb (53.1 kg), last menstrual period 06/07/2015, SpO2 100 %.  Physical Exam:  General: Well developed, well nourished female in no acute distress. Alert and oriented x 3.  Cardiovascular: Regular rate and rhythm. S1 and S2 normal.  Lungs: Clear to auscultation bilaterally. No wheezes/crackles/rhonchi noted.  Skin: No rashes or lesions present. Back: No CVA tenderness.  Abdomen: Abdomen  soft, non-tender and non-obese. Active bowel sounds in all quadrants. No evidence of a fluid wave or abdominal masses.  Lap sites healed Extremities: No bilateral cyanosis, edema, or clubbing.  Pelvic exam: No residual granulation tissue at cuff. No nodulariy or lesions. Rectal: smooth, no nodularity or masses   Thereasa Solo, MD  08/26/2017, 1:43 PM

## 2017-08-26 NOTE — Patient Instructions (Signed)
Please notify Dr Denman George at phone number [phone] if you notice vaginal bleeding, new pelvic or abdominal pains, bloating, feeling full easy, or a change in bladder or bowel function.   We have an ultrasound scheduled for you to evaluate your ovaries.  Call Dr Serita Grit office at the number above in July, 2019 to schedule an appointment for October, 2019

## 2017-08-29 ENCOUNTER — Other Ambulatory Visit: Payer: Self-pay

## 2017-08-29 MED ORDER — PANTOPRAZOLE SODIUM 20 MG PO TBEC: 20.0000 mg | DELAYED_RELEASE_TABLET | Freq: Every day | ORAL | 0 refills | Status: DC

## 2017-08-30 ENCOUNTER — Ambulatory Visit (HOSPITAL_COMMUNITY)
Admission: RE | Admit: 2017-08-30 | Discharge: 2017-08-30 | Disposition: A | Discharge status: Home / Self Care | Payer: Self-pay | Source: Ambulatory Visit | Attending: Gynecologic Oncology | Admitting: Gynecologic Oncology

## 2017-08-30 DIAGNOSIS — Z90710 Acquired absence of both cervix and uterus: Secondary | ICD-10-CM | POA: Insufficient documentation

## 2017-08-30 DIAGNOSIS — R102 Pelvic and perineal pain: Secondary | ICD-10-CM | POA: Insufficient documentation

## 2017-08-31 LAB — CYTOLOGY - PAP
Diagnosis: NEGATIVE
HPV: NOT DETECTED

## 2017-09-01 ENCOUNTER — Telehealth: Payer: Self-pay

## 2017-09-01 NOTE — Telephone Encounter (Signed)
Told the daughter Irving Shows the results of the PAP as noted below by Dr. Denman George as well as the US of the Abdomen done on 08-30-17 per Joylene John, NP.

## 2017-09-01 NOTE — Telephone Encounter (Signed)
Told relative Lynelle Smoke that the [place]  Was normal per Melissa Cross,NP.

## 2017-09-01 NOTE — Telephone Encounter (Signed)
-----   Message from Everitt Amber, MD sent at 08/31/2017  3:26 PM EDT ----- Pap normal. No further intervention necessary at this time.

## 2017-09-16 ENCOUNTER — Other Ambulatory Visit (HOSPITAL_COMMUNITY): Payer: Self-pay | Admitting: *Deleted

## 2017-09-16 DIAGNOSIS — N644 Mastodynia: Secondary | ICD-10-CM

## 2017-10-13 ENCOUNTER — Ambulatory Visit (HOSPITAL_COMMUNITY)
Admission: RE | Admit: 2017-10-13 | Discharge: 2017-10-13 | Disposition: A | Discharge status: Home / Self Care | Payer: Self-pay | Source: Ambulatory Visit | Attending: Obstetrics and Gynecology | Admitting: Obstetrics and Gynecology

## 2017-10-13 ENCOUNTER — Ambulatory Visit
Admission: RE | Admit: 2017-10-13 | Discharge: 2017-10-13 | Disposition: A | Discharge status: Home / Self Care | Payer: No Typology Code available for payment source | Source: Ambulatory Visit | Attending: Obstetrics and Gynecology | Admitting: Obstetrics and Gynecology

## 2017-10-13 ENCOUNTER — Encounter (HOSPITAL_COMMUNITY): Payer: Self-pay

## 2017-10-13 VITALS — BP 112/63 | Ht 61.0 in

## 2017-10-13 DIAGNOSIS — Z1239 Encounter for other screening for malignant neoplasm of breast: Secondary | ICD-10-CM

## 2017-10-13 DIAGNOSIS — N644 Mastodynia: Secondary | ICD-10-CM

## 2017-10-13 NOTE — Progress Notes (Signed)
Complaints of bilateral breast pain that is greater within the right breast. Patient states the pain is within her right outer breast and radiates to her axilla. Patient complained of diffuse left breast pain. Patient stated the pain was an 8 and now rates at a 3 out of 10.  Pap Smear: Pap smear not completed today.Last Pap smear was 09/05/2017 at the [facility] and normal with negative HPV. Patient has a history of an abnormal Pap smear 02/26/2015 at the [facility] that was HGSIL. 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 Patient had a complete hysterectomy 07/01/2015 for cervical cancer. Patient is being followed by GYN Oncology at the [facility] for follow up. Per patient her Pap smear on 02/26/2015 is the only abnormal Pap smear she has had. Last four Pap smears, colposcopy, LEEP, and hysterectomy results are all in EPIC.   Physical exam: Breasts Breasts symmetrical. No skin abnormalities bilateral breasts. No nipple retraction bilateral breasts. No nipple discharge bilateral breasts. No lymphadenopathy. No lumps palpated left breast. Palpated a lump within the right breast at 10 o'clock 4 cm from the nipple. Complaints of right outer and at center of chest between breast tenderness on exam. Referred patient to the [facility] for diagnostic mammogram and possible right breast ultrasound. Appointment scheduled for Thursday, Oct 13, 2017 at 1050.      Pelvic/Bimanual No Pap smear completed today since last Pap smear and HPV typing was 09/05/2017. Pap smear not indicated per BCCCP guidelines.   Smoking History: Patient has never smoked.  Patient Navigation: Patient education provided. Access to services provided for patient through [facility] program. Spanish interpreter provided.   Breast  and Cervical Cancer Risk Assessment: Patient has a history of lobular carcinoma. Patient has no family history of breast cancer, known genetic mutations, or radiation treatment to the chest before age 89. Patient has a history of cervical cancer. Patient has no history of being immunocompromised or DES exposure in-utero. Patient has a 5-year risk of breast cancer at 1.6% and lifetime risk at 12.3%.  Used Spanish interpreter [employer] from [place].

## 2017-10-13 NOTE — Patient Instructions (Addendum)
Explained breast self awareness with [name]. Patient did not need a Pap smear today due to last Pap smear and HPV typing was 09/05/2017. Patient is being followed by GYN Oncology at [facility] for Pap smears. Referred patient to the [facility] for diagnostic mammogram and possible right breast ultrasound. Appointment scheduled for Thursday, Oct 13, 2017 at 1050. Crystal [name] verbalized understanding.  Harjit Douds, Arvil Chaco, RN 10:29 AM

## 2017-11-01 ENCOUNTER — Encounter (HOSPITAL_COMMUNITY): Payer: Self-pay | Admitting: *Deleted

## 2017-11-25 ENCOUNTER — Ambulatory Visit: Payer: No Typology Code available for payment source | Attending: Family Medicine

## 2017-12-14 ENCOUNTER — Ambulatory Visit: Payer: Self-pay | Attending: Family Medicine | Admitting: Physician Assistant

## 2017-12-14 VITALS — BP 99/63 | HR 96 | Temp 98.2°F | Resp 18 | Ht 59.0 in | Wt 116.0 lb

## 2017-12-14 DIAGNOSIS — K5900 Constipation, unspecified: Secondary | ICD-10-CM | POA: Insufficient documentation

## 2017-12-14 DIAGNOSIS — K121 Other forms of stomatitis: Secondary | ICD-10-CM | POA: Insufficient documentation

## 2017-12-14 DIAGNOSIS — Z79899 Other long term (current) drug therapy: Secondary | ICD-10-CM | POA: Insufficient documentation

## 2017-12-14 DIAGNOSIS — R1084 Generalized abdominal pain: Secondary | ICD-10-CM | POA: Insufficient documentation

## 2017-12-14 DIAGNOSIS — Z789 Other specified health status: Secondary | ICD-10-CM

## 2017-12-14 DIAGNOSIS — Z86000 Personal history of in-situ neoplasm of breast: Secondary | ICD-10-CM | POA: Insufficient documentation

## 2017-12-14 DIAGNOSIS — Z8541 Personal history of malignant neoplasm of cervix uteri: Secondary | ICD-10-CM | POA: Insufficient documentation

## 2017-12-14 DIAGNOSIS — Z87442 Personal history of urinary calculi: Secondary | ICD-10-CM | POA: Insufficient documentation

## 2017-12-14 NOTE — Progress Notes (Signed)
Patient ID: Shaila Gilchrest Ramirez-Flores, female   DOB: July 16, 1970, 47 y.o.   MRN: [id number]      Marquise Lambson, is a 47 y.o. female  BJS:[id number]  YWV:[id number]  DOB - [birth year]  Subjective:  Chief Complaint and HPI: Tamkia Temples is a 47 y.o. female here today Constipation on and off X 3 years.  Pain in abdomen and lower back for about 6 weeks. Sensation that there is something stuck/retained stool has worsened in this time period.  +h/o cervical cancer.   Sometimes has BM in the morning and then still feels like she still needs to go again all day and can't. Upper endoscopy 04/2017 but has not had colonoscopy.  Senokot and drinking a lot of water hasn't helped.  appetite is good.  No melena.  No hematochezia.  She has had a hysterectomy.   "Roselie Awkward" with [employer] translating.  Sore on inside of mouth on the L for 3-4 days   ROS:   Constitutional:  No f/c, No night sweats, No unexplained weight loss. EENT:  No vision changes, No blurry vision, No hearing changes. No other mouth, throat, or ear problems.  Respiratory: No cough, No SOB Cardiac: No CP, no palpitations GI:  + intermittent abd pain, No N/V/D. GU: No Urinary s/sx Musculoskeletal: No joint pain Neuro: No headache, no dizziness, no motor weakness.  Skin: No rash Endocrine:  No polydipsia. No polyuria.  Psych: Denies SI/HI  No problems updated.  ALLERGIES: No Known Allergies  PAST MEDICAL HISTORY: Past Medical History:  Diagnosis Date  . Cancer ([place])    cervical cancer dx.   . History of kidney stones    x1   . Kidney stones    cyst on kidney -no problemsm hx of kidney stones  . 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 PONV (postoperative nausea and vomiting)     MEDICATIONS AT HOME: Prior to Admission medications   Medication Sig Start Date End Date Taking? Authorizing Provider  lidocaine (LIDODERM) 5 % Place 1 patch onto the skin daily.  Remove & Discard patch within 12 hours or as directed by MD Patient not taking: Reported on 08/26/2017 08/12/17   Charlott Rakes, MD  linaclotide (LINZESS) 72 MCG capsule Take 1 capsule (72 mcg total) by mouth daily before breakfast. Patient not taking: Reported on 10/13/2017 04/19/17   Mauri Pole, MD  pantoprazole (PROTONIX) 20 MG tablet Take 1 tablet (20 mg total) by mouth daily. 08/29/17 09/28/17  Charlott Rakes, MD  tiZANidine (ZANAFLEX) 4 MG tablet Take 1 tablet (4 mg total) by mouth every 8 (eight) hours as needed for muscle spasms. Patient not taking: Reported on 12/14/2017 08/12/17   Charlott Rakes, MD     Objective:  EXAM:   Vitals:   12/14/17 1632  BP: 99/63  Pulse: 96  Resp: 18  Temp: 98.2 F (36.8 C)  TempSrc: Oral  SpO2: 98%  Weight: 116 lb (52.6 kg)  Height: 4\' 11"  (1.499 m)    General appearance : A&OX3. NAD. Non-toxic-appearing HEENT: Atraumatic and Normocephalic.  PERRLA. EOM intact. Small apthous ulcer inner L lip Neck: supple, no JVD. No cervical lymphadenopathy. No thyromegaly Chest/Lungs:  Breathing-non-labored, Good air entry bilaterally, breath sounds normal without rales, rhonchi, or wheezing  CVS: S1 S2 regular, no murmurs, gallops, rubs  Abdomen: Bowel sounds present, Non tender and not distended with no gaurding, rigidity or rebound. Extremities: Bilateral Lower Ext shows no edema, both legs are  warm to touch with = pulse throughout Neurology:  CN II-XII grossly intact, Non focal.   Psych:  TP linear. J/I WNL. Normal speech. Appropriate eye contact and affect.  Skin:  No Rash  Data Review Lab Results  Component Value Date   HGBA1C 5.5 04/04/2015     Assessment & Plan   1. Constipation, unspecified constipation type Given h/o cervical CA, will refer to GI.   - Comprehensive metabolic panel - TSH - Ambulatory referral to Gastroenterology - Vitamin D, 25-hydroxy -high fiber diet and increase water intake  2. Generalized abdominal  pain Non-acute abdomen/no red flags - CBC with Differential/Platelet - Ambulatory referral to Gastroenterology  3. Stomatitis Salt water gargles  4. Language barrier str[employer] interpreters used and additional time performing visit was required.      Patient have been counseled extensively about nutrition and exercise  Return in about 3 months (around 03/16/2018) for Dr Margarita Rana.  The patient was given clear instructions to go to ER or return to medical center if symptoms don't improve, worsen or new problems develop. The patient verbalized understanding. The patient was told to call to get lab results if they haven't heard anything in the next week.     Freeman Caldron, PA-C [facility], [place]   12/14/2017, 5:01 PM

## 2017-12-14 NOTE — Patient Instructions (Signed)
Gargle with warm salt water 3 times daily   lceras bucales (Oral Ulcers) Las lceras bucales son llagas en el interior de la boca o cerca de la boca. Se pueden llamar aftas o herpes labiales que son dos tipos de lceras bucales. Muchas lceras bucales son inofensivas y desaparecen solas. En ciertos casos, las lceras bucales pueden requerir atencin mdica para Office manager causa y su tratamiento adecuado. CAUSAS Las causas ms frecuentes de esta afeccin incluyen las siguientes:  Una infeccin viral, bacteriana o fngica.  Estrs emocional.  Alimentos o sustancias qumicas que irritan la boca.  Lesiones o irritacin fsica de la boca.  Medicamentos.  Alergias.  Consumo de tabaco. Algunas causas menos frecuentes incluyen lo siguiente:  Enfermedad de la piel.  Un tipo de infeccin del virus del herpes (herpes simple o herpes zoster).  Cncer bucal. En muchos de los casos, se desconoce la causa de esta afeccin. FACTORES DE RIESGO Es ms probable que las lceras bucales se desarrollen en:  Personas que usan aparatos de ortodoncia, dentaduras postizas o correctores dentales.  Las [employer] no se lavan la boca o cepillan los dientes con regularidad.  Las personas de piel sensible.  Las personas que tienen afecciones que afectan a todo el cuerpo (afecciones sistmicas), como los trastornos inmunitarios. SNTOMAS El principal sntoma de esta afeccin es una o ms lceras de forma oval o redonda que [employer] bordes rojos. Los detalles de los sntomas pueden variar segn sea la causa.  Ubicacin de las lceras. Pueden estar adentro de la boca, en las encas o en el interior de labios o mejillas. Tambin pueden aparecer en los labios o en la piel que se encuentra alrededor de la boca, como las mejillas y [place].  Dolor. Las lceras pueden ser dolorosas e incmodas o pueden no Engineer, drilling.  [place]. Pueden parecer ampollas y estar llenas de lquido o  pueden ser manchas blancas o [place].  Frecuencia de los brotes. Las lceras pueden desaparecer permanentemente despus de un brote o Geologist, engineering (recurrente) a menudo o casi nunca. DIAGNSTICO Esta afeccin se diagnostica mediante un examen fsico. El mdico le har preguntas sobre su estilo de vida e historia clnica. Tambin pueden hacerle exmenes que incluyen lo siguiente:  Anlisis de [place].  Extraccin de una pequea cantidad de clulas de una lcera para examinarlas con un microscopio (biopsia). TRATAMIENTO El tratamiento para esta afeccin es el control de cualquier dolor o [place], y de la causa preexistente de las lceras, si es necesario. Generalmente, las lceras bucales se curan solas en el trmino de 1 a 2 semanas. Le indicarn que mantenga la boca limpia y evite las cosas que causan o irritan las lceras. El mdico le indicar medicamentos para reducir Conservation officer, historic buildings y las molestias o para tratar la causa preexistente, si es necesario. INSTRUCCIONES PARA EL CUIDADO EN EL HOGAR Estilo de vida  Siga las indicaciones del mdico respecto de las restricciones para las comidas o las bebidas. ? Beba suficiente lquido para mantener la orina clara o de color amarillo plido. ? Evite los alimentos y bebidas que irritan las lceras.  No consuma productos que contengan tabaco, incluidos cigarrillos, tabaco de Higher education careers adviser o cigarrillos electrnicos. Si necesita ayuda para dejar de fumar, consulte al mdico.  Evite el consumo excesivo de alcohol. Higiene bucal  Evite los irritantes fsicos o qumicos que pueden causar las lceras o empeorarlas, como los enjuagues bucales que contienen alcohol (etanol). Si [place] aparatos de ortodoncia, dentaduras postizas o  correctores dentales, consulte con su dentista para asegurarse de que estos dispositivos estn bien ajustados.  Cepllese los dientes y use hilo dental al menos una vez al da y hgase limpiezas dentales y controles regulares.  Haga grgaras  con una mezcla de agua y sal, 3 o 4 veces por da o como se lo haya indicado el mdico. Para preparar la mezcla de agua con sal, disuelva totalmente de media a 1cucharadita de sal en 1taza de agua tibia. Instrucciones generales  [employer] de venta libre y los recetados solamente como se lo haya indicado el mdico.  Si tiene dolor, envuelva una compresa fra con una toalla y presinela suavemente contra el rostro para reducir Conservation officer, historic buildings.  Concurra a todas las visitas de control como se lo haya indicado el mdico. Esto es importante. SOLICITE ATENCIN MDICA SI:  Tiene un dolor que empeora o que no mejora con los medicamentos.  Tiene 4 o ms lceras al mismo tiempo.  Tiene fiebre.  Tiene lceras nuevas que parecen o se sienten diferentes a otras lceras que ha tenido.  Tiene inflamacin en uno o ambos ojos.  Tiene lceras que no desaparecen despus de 10das.  Presenta sntomas nuevos en la boca, por ejemplo: ? Sangrado o costras alrededor de los labios o encas. ? Dolor dental. ? Dificultad para tragar.  Aparecen sntomas en la piel o en los genitales, como: ? Una erupcin o ampollas. ? Sensacin de ardor o picazn.  Tiene lceras que aparecen o empeoran despus de comenzar a tomar un medicamento nuevo. SOLICITE ATENCIN MDICA DE INMEDIATO SI:  Tiene dificultad para respirar.  Tiene hinchazn en la cara o en el cuello.  Tiene un sangrado excesivo en la boca.  Siente dolor intenso. Esta informacin no tiene Marine scientist el consejo del mdico. Asegrese de hacerle al mdico cualquier pregunta que tenga. Document Released: 05/10/2005 Document Revised: 09/01/2015 Document Reviewed: 09/25/2014 Elsevier Interactive Patient Education  2018 [employer].

## 2017-12-15 ENCOUNTER — Other Ambulatory Visit: Payer: Self-pay | Admitting: Physician Assistant

## 2017-12-15 LAB — COMPREHENSIVE METABOLIC PANEL
ALBUMIN: 4.5 g/dL (ref 3.5–5.5)
ALK PHOS: 81 IU/L (ref 39–117)
ALT: 30 IU/L (ref 0–32)
AST: 25 IU/L (ref 0–40)
Albumin/Globulin Ratio: 1.5 (ref 1.2–2.2)
BILIRUBIN TOTAL: 0.4 mg/dL (ref 0.0–1.2)
BUN / CREAT RATIO: 13 (ref 9–23)
BUN: 13 mg/dL (ref 6–24)
CHLORIDE: 104 mmol/L (ref 96–106)
CO2: 22 mmol/L (ref 20–29)
Calcium: 9.7 mg/dL (ref 8.7–10.2)
Creatinine, Ser: 0.98 mg/dL (ref 0.57–1.00)
GFR calc Af Amer: 78 mL/min/{1.73_m2} (ref >59)
GFR calc non Af Amer: 68 mL/min/{1.73_m2} (ref >59)
GLUCOSE: 68 mg/dL (ref 65–99)
Globulin, Total: 3 g/dL (ref 1.5–4.5)
Potassium: 4 mmol/L (ref 3.5–5.2)
Sodium: 140 mmol/L (ref 134–144)
Total Protein: 7.5 g/dL (ref 6.0–8.5)

## 2017-12-15 LAB — CBC WITH DIFFERENTIAL/PLATELET
BASOS ABS: 0 10*3/uL (ref 0.0–0.2)
Basos: 0 %
EOS (ABSOLUTE): 0.1 10*3/uL (ref 0.0–0.4)
Eos: 2 %
Hematocrit: 42.4 % (ref 34.0–46.6)
Hemoglobin: 14.7 g/dL (ref 11.1–15.9)
IMMATURE GRANS (ABS): 0 10*3/uL (ref 0.0–0.1)
Immature Granulocytes: 0 %
LYMPHS ABS: 1.7 10*3/uL (ref 0.7–3.1)
LYMPHS: 22 %
MCH: 33.1 pg — AB (ref 26.6–33.0)
MCHC: 34.7 g/dL (ref 31.5–35.7)
MCV: 96 fL (ref 79–97)
Monocytes Absolute: 0.7 10*3/uL (ref 0.1–0.9)
Monocytes: 9 %
NEUTROS ABS: 5.2 10*3/uL (ref 1.4–7.0)
Neutrophils: 67 %
PLATELETS: 271 10*3/uL (ref 150–450)
RBC: 4.44 x10E6/uL (ref 3.77–5.28)
RDW: 13 % (ref 12.3–15.4)
WBC: 7.9 10*3/uL (ref 3.4–10.8)

## 2017-12-15 LAB — TSH: TSH: 1.67 u[IU]/mL (ref 0.450–4.500)

## 2017-12-15 LAB — VITAMIN D 25 HYDROXY (VIT D DEFICIENCY, FRACTURES): VIT D 25 HYDROXY: 15.9 ng/mL — AB (ref 30.0–100.0)

## 2017-12-15 MED ORDER — VITAMIN D (ERGOCALCIFEROL) 1.25 MG (50000 UNIT) PO CAPS: 50000.0000 [IU] | ORAL_CAPSULE | ORAL | 0 refills | Status: DC

## 2017-12-16 ENCOUNTER — Telehealth: Payer: Self-pay | Admitting: *Deleted

## 2017-12-16 NOTE — Telephone Encounter (Signed)
Medical Assistant used [employer] to contact patient.  Interpreter Name: Sherrilyn Rist #: [id number] Patient was not available, [employer] left patient a voicemail. Patient is aware of labs being normal except for vitamin d being low and needing to pick up supplement from [facility] pharmacy.

## 2017-12-16 NOTE — Telephone Encounter (Signed)
-----   Message from Argentina Donovan, [place] sent at 12/15/2017 11:02 AM EDT ----- Please call patient and let them that their vitamin D is low.  This can contribute to muscle aches, anxiety, fatigue, and depression.  I have sent a prescription to the pharmacy for them to take once a week.  We will recheck this level in 3-4 months.  Her other labs are normal.  Follow-up as planned.  Thanks, Freeman Caldron, PA-C

## 2017-12-26 ENCOUNTER — Ambulatory Visit: Payer: No Typology Code available for payment source | Attending: Family Medicine

## 2018-01-07 IMAGING — CT NM PET TUM IMG INITIAL (PI) SKULL BASE T - THIGH
1 of 8 series · 1 of 25 positions shown · non-contrast
Comparison: 04/22/2015 abdomen CT

CLINICAL DATA: Initial treatment strategy for malignant neoplasm of
cervix..

EXAM:
NUCLEAR MEDICINE PET SKULL BASE TO THIGH
TECHNIQUE: 5.5 mCi F-18 FDG was injected intravenously. Full-ring PET imaging
was performed from the skull base to thigh after the radiotracer. CT
data was obtained and used for attenuation correction and anatomic
localization.
FASTING BLOOD GLUCOSE:  Value: 79 mg/dl

[Series 4: ct sk_thigh 5.0 b31f · axial · 5.0mm · 0.98mm/px · 1 of 194 slices shown]
[im 194/194  brain]
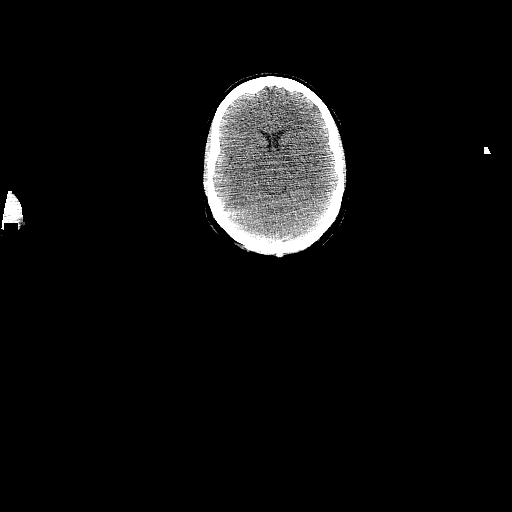

[1 of 25 positions shown; findings below may reference images not displayed]

FINDINGS: NECK

No areas of abnormal hypermetabolism.

CHEST

No abnormal nodal activity within the chest. A focus of
hypermetabolism within the lateral aspect of the left breast is
without well-defined CT correlate. This measures a S.U.V. max of
5.6, including on approximately image 61/series 4.

ABDOMEN/PELVIS

No areas of abnormal hypermetabolism. No primary or cervical
hypermetabolism identified.

SKELETON

No abnormal marrow activity.

CT IMAGES PERFORMED FOR ATTENUATION CORRECTION

No cervical adenopathy. Normal adrenal glands. Punctate left renal
collecting system calculi suspected. No dominant cervical mass
identified.
IMPRESSION: 1. No primary hypermetabolic primary cervical mass or evidence of
nodal metastasis.
2. Focal hypermetabolism in the lateral left breast. No CT
correlate. Consider correlation with diagnostic mammography and
ultrasound to exclude otherwise occult breast lesion.
3. Probable left nephrolithiasis.

## 2018-01-12 ENCOUNTER — Ambulatory Visit: Payer: Self-pay | Attending: Family Medicine | Admitting: Physician Assistant

## 2018-01-12 ENCOUNTER — Ambulatory Visit (HOSPITAL_COMMUNITY)
Admission: RE | Admit: 2018-01-12 | Discharge: 2018-01-12 | Disposition: A | Discharge status: Home / Self Care | Payer: Self-pay | Source: Ambulatory Visit | Attending: Physician Assistant | Admitting: Physician Assistant

## 2018-01-12 VITALS — BP 108/69 | HR 77 | Temp 98.9°F | Resp 18 | Ht 59.0 in | Wt 116.0 lb

## 2018-01-12 DIAGNOSIS — M79672 Pain in left foot: Secondary | ICD-10-CM | POA: Insufficient documentation

## 2018-01-12 DIAGNOSIS — Z853 Personal history of malignant neoplasm of breast: Secondary | ICD-10-CM | POA: Insufficient documentation

## 2018-01-12 DIAGNOSIS — Z79899 Other long term (current) drug therapy: Secondary | ICD-10-CM | POA: Insufficient documentation

## 2018-01-12 DIAGNOSIS — Z8541 Personal history of malignant neoplasm of cervix uteri: Secondary | ICD-10-CM | POA: Insufficient documentation

## 2018-01-12 NOTE — Progress Notes (Signed)
Patient ID: Crystal [name], female   DOB: 12-Jul-1970, 47 y.o.   MRN: [id number]     Crystal [name], is a 47 y.o. female  [id number]  [id number]  DOB - 1971-01-11  Subjective:  Chief Complaint and HPI: Crystal [name] is a 47 y.o. female here today with L foot pain for 3 weeks.  [place] with [employer] translating.  NKI.  Posterior-Lateral aspect of L foot is painful and feels a sharp pain at times that feels like a "ball' or something pulling.  No f/c.  Mostly concerned bc of h/o cancer.  No SOB.  No calf pain or swelling.    ROS:   Constitutional:  No f/c, No night sweats, No unexplained weight loss. EENT:  No vision changes, No blurry vision, No hearing changes. No mouth, throat, or ear problems.  Respiratory: No cough, No SOB Cardiac: No CP, no palpitations GI:  No abd pain, No N/V/D. GU: No Urinary s/sx Musculoskeletal: + L foot pain Neuro: No headache, no dizziness, no motor weakness.  Skin: No rash Endocrine:  No polydipsia. No polyuria.  Psych: Denies SI/HI  No problems updated.  ALLERGIES: No Known Allergies  PAST MEDICAL HISTORY: Past Medical History:  Diagnosis Date  . Cancer ([place])    cervical cancer dx.   . History of kidney stones    x1   . Kidney stones    cyst on kidney -no problemsm hx of kidney stones  . 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 PONV (postoperative nausea and vomiting)     MEDICATIONS AT HOME: Prior to Admission medications   Medication Sig Start Date End Date Taking? Authorizing Provider  Vitamin D, Ergocalciferol, (DRISDOL) 50000 units CAPS capsule Take 1 capsule (50,000 Units total) by mouth every 7 (seven) days. 12/15/17  Yes Argentina Donovan, PA-C  lidocaine (LIDODERM) 5 % Place 1 patch onto the skin daily. Remove & Discard patch within 12 hours or as directed by MD Patient not taking: Reported on 01/12/2018 08/12/17   Charlott Rakes, MD  linaclotide  [facility]) 72 MCG capsule Take 1 capsule (72 mcg total) by mouth daily before breakfast. Patient not taking: Reported on 10/13/2017 04/19/17   Mauri Pole, MD  pantoprazole (PROTONIX) 20 MG tablet Take 1 tablet (20 mg total) by mouth daily. 08/29/17 09/28/17  Charlott Rakes, MD  tiZANidine (ZANAFLEX) 4 MG tablet Take 1 tablet (4 mg total) by mouth every 8 (eight) hours as needed for muscle spasms. Patient not taking: Reported on 12/14/2017 08/12/17   Charlott Rakes, MD     Objective:  EXAM:   Vitals:   01/12/18 1627  BP: 108/69  Pulse: 77  Resp: 18  Temp: 98.9 F (37.2 C)  TempSrc: Oral  SpO2: 98%  Weight: 116 lb (52.6 kg)  Height: 4\' 11"  (1.499 m)    General appearance : A&OX3. NAD. Non-toxic-appearing HEENT: Atraumatic and Normocephalic.  PERRLA. EOM intact.   Neck: supple, no JVD. No cervical lymphadenopathy. No thyromegaly Chest/Lungs:  Breathing-non-labored, Good air entry bilaterally, breath sounds normal without rales, rhonchi, or wheezing  CVS: S1 S2 regular, no murmurs, gallops, rubs  Extremities: Bilateral Lower Ext shows no edema, both legs are warm to touch with = pulse throughout.  L posterior-lateral aspect of foot, no erythema or swelling or visible abnormality.  ROM WNL. Pulses intact.  No TTP.  Ankle with full ROM and no abnormality.  Calves B symmetrical w/o erythema.  Neg Homan's B.  Neurology:  CN II-XII grossly intact, Non focal.   Psych:  TP linear. J/I WNL. Normal speech. Appropriate eye contact and affect.  Skin:  No Rash  Data Review Lab Results  Component Value Date   HGBA1C 5.5 04/04/2015     Assessment & Plan   1. Left foot pain Use advil or tylenol.  Gentle stretching.  Reassurance provided.   - DG Foot 2 Views Left; Future   Patient have been counseled extensively about nutrition and exercise  Return for keep 10/23 appt with Dr Margarita Rana.  The patient was given clear instructions to go to ER or return to medical center if symptoms don't  improve, worsen or new problems develop. The patient verbalized understanding. The patient was told to call to get lab results if they haven't heard anything in the next week.     Freeman Caldron, PA-C [facility] and [facility] [place], [place]   01/12/2018, 4:47 PM

## 2018-01-13 ENCOUNTER — Telehealth: Payer: Self-pay | Admitting: *Deleted

## 2018-01-13 NOTE — Telephone Encounter (Signed)
-----   Message from Argentina Donovan, [place] sent at 01/13/2018  6:51 AM EDT ----- Please call her after 2pm today and let her know that her Xray is normal.  Thanks, Freeman Caldron, PA-C

## 2018-01-13 NOTE — Telephone Encounter (Signed)
Medical Assistant used [employer] to contact patient.  Interpreter Name: Derrisha Foos #: [id number] MA unable to reach patient due to busy ring with no VM option. !!!Please inform patient of xray being normal and to follow up as planned!!!

## 2018-01-25 ENCOUNTER — Ambulatory Visit: Payer: Self-pay | Attending: Family Medicine | Admitting: Physician Assistant

## 2018-01-25 VITALS — BP 112/62 | HR 75 | Temp 98.3°F | Resp 18 | Ht 59.0 in | Wt 117.0 lb

## 2018-01-25 DIAGNOSIS — H524 Presbyopia: Secondary | ICD-10-CM | POA: Insufficient documentation

## 2018-01-25 DIAGNOSIS — R519 Headache, unspecified: Secondary | ICD-10-CM

## 2018-01-25 DIAGNOSIS — R51 Headache: Secondary | ICD-10-CM | POA: Insufficient documentation

## 2018-01-25 DIAGNOSIS — Z853 Personal history of malignant neoplasm of breast: Secondary | ICD-10-CM | POA: Insufficient documentation

## 2018-01-25 DIAGNOSIS — M62838 Other muscle spasm: Secondary | ICD-10-CM | POA: Insufficient documentation

## 2018-01-25 DIAGNOSIS — Z79899 Other long term (current) drug therapy: Secondary | ICD-10-CM | POA: Insufficient documentation

## 2018-01-25 DIAGNOSIS — Z8541 Personal history of malignant neoplasm of cervix uteri: Secondary | ICD-10-CM | POA: Insufficient documentation

## 2018-01-25 DIAGNOSIS — M546 Pain in thoracic spine: Secondary | ICD-10-CM | POA: Insufficient documentation

## 2018-01-25 DIAGNOSIS — Z789 Other specified health status: Secondary | ICD-10-CM

## 2018-01-25 MED ORDER — NAPROXEN 500 MG PO TABS: 500.0000 mg | ORAL_TABLET | Freq: Two times a day (BID) | ORAL | 1 refills | Status: DC

## 2018-01-25 MED ORDER — METHOCARBAMOL 500 MG PO TABS: 500.0000 mg | ORAL_TABLET | Freq: Three times a day (TID) | ORAL | 0 refills | Status: DC | PRN

## 2018-01-25 MED FILL — NAPROXEN 500 MG TABLET: 500 | 30 days supply | Qty: 60 | Fill #0

## 2018-01-25 MED FILL — METHOCARBAMOL 500 MG TABS: 500 | 20 days supply | Qty: 60 | Fill #0

## 2018-01-25 NOTE — Progress Notes (Signed)
Patient ID: Crystal [name], female   DOB: 1970-10-07, 47 y.o.   MRN: [id number]        Crystal [name], is a 47 y.o. female  [id number]  [id number]  DOB - 06-03-1970  Subjective:  Chief Complaint and HPI: Crystal [name] is a 47 y.o. female here today with several issues.  2-3 months of blurry vision when she tries to read. No eye pain.  Distant vision is good.    HA daily for last several months.   HA improve with OTC meds or rest.  No N/V.  No aura.  HA starts at back of neck and around head.  No FH aneurysm.    Also c/o mid and upper back pain on both sides for about 6 months.  No urinary s/sx.  Worse with movement.    [place] with [employer] interpreters Social: has 2 kids; works  ROS:   Constitutional:  No f/c, No night sweats, No unexplained weight loss. EENT:  + vision changes, + blurry vision, No hearing changes. No mouth, throat, or ear problems.  Respiratory: No cough, No SOB Cardiac: No CP, no palpitations GI:  No abd pain, No N/V/D. GU: No Urinary s/sx Musculoskeletal: No joint pain Neuro: + headache, no dizziness, no motor weakness.  Skin: No rash Endocrine:  No polydipsia. No polyuria.  Psych: Denies SI/HI  No problems updated.  ALLERGIES: No Known Allergies  PAST MEDICAL HISTORY: Past Medical History:  Diagnosis Date  . Cancer ([place])    cervical cancer dx.   . History of kidney stones    x1   . Kidney stones    cyst on kidney -no problemsm hx of kidney stones  . 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 PONV (postoperative nausea and vomiting)     MEDICATIONS AT HOME: Prior to Admission medications   Medication Sig Start Date End Date Taking? Authorizing Provider  Vitamin D, Ergocalciferol, (DRISDOL) 50000 units CAPS capsule Take 1 capsule (50,000 Units total) by mouth every 7 (seven) days. 12/15/17  Yes Argentina Donovan, PA-C  lidocaine (LIDODERM) 5 % Place 1 patch onto the skin daily.  Remove & Discard patch within 12 hours or as directed by MD Patient not taking: Reported on 01/12/2018 08/12/17   Charlott Rakes, MD  linaclotide [facility]) 72 MCG capsule Take 1 capsule (72 mcg total) by mouth daily before breakfast. Patient not taking: Reported on 10/13/2017 04/19/17   Mauri Pole, MD  methocarbamol (ROBAXIN) 500 MG tablet Take 1 tablet (500 mg total) by mouth every 8 (eight) hours as needed for muscle spasms. 01/25/18   Argentina Donovan, PA-C  naproxen (NAPROSYN) 500 MG tablet Take 1 tablet (500 mg total) by mouth 2 (two) times daily with a meal. 01/25/18   Landrey Mahurin, Dionne Bucy, PA-C  pantoprazole (PROTONIX) 20 MG tablet Take 1 tablet (20 mg total) by mouth daily. 08/29/17 09/28/17  Charlott Rakes, MD  tiZANidine (ZANAFLEX) 4 MG tablet Take 1 tablet (4 mg total) by mouth every 8 (eight) hours as needed for muscle spasms. Patient not taking: Reported on 12/14/2017 08/12/17   Charlott Rakes, MD     Objective:  EXAM:   Vitals:   01/25/18 1450  BP: 112/62  Pulse: 75  Resp: 18  Temp: 98.3 F (36.8 C)  TempSrc: Oral  SpO2: 97%  Weight: 117 lb (53.1 kg)  Height: 4\' 11"  (1.499 m)    General appearance : A&OX3. NAD. Non-toxic-appearing HEENT: Atraumatic  and Normocephalic.  PERRLA. EOM intact.  Fundi benign.  TM clear B. Mouth-MMM, post pharynx WNL w/o erythema, No PND. Neck: supple, no JVD. No cervical lymphadenopathy. No thyromegaly Chest/Lungs:  Breathing-non-labored, Good air entry bilaterally, breath sounds normal without rales, rhonchi, or wheezing  Spasm in trapezius area that is TTP CVS: S1 S2 regular, no murmurs, gallops, rubs  Extremities: Bilateral Lower Ext shows no edema, both legs are warm to touch with = pulse throughout Neurology:  CN II-XII grossly intact, Non focal.   Psych:  TP linear. J/I WNL. Normal speech. Appropriate eye contact and affect.  Skin:  No Rash  Data Review Lab Results  Component Value Date   HGBA1C 5.5 04/04/2015     Assessment &  Plan   1. Nonintractable headache, unspecified chronicity pattern, unspecified headache type No red flags Likely combination tension in addition to presbyopia - naproxen (NAPROSYN) 500 MG tablet; Take 1 tablet (500 mg total) by mouth 2 (two) times daily with a meal.  Dispense: 60 tablet; Refill: 1  2. Muscle spasm - methocarbamol (ROBAXIN) 500 MG tablet; Take 1 tablet (500 mg total) by mouth every 8 (eight) hours as needed for muscle spasms.  Dispense: 60 tablet; Refill: 0  3. Presbyopia of both eyes - Ambulatory referral to Ophthalmology  4. Language barrier [employer] interpreters used and additional time performing visit was required.      Patient have been counseled extensively about nutrition and exercise  Return for keep 10/23 appt with Dr Margarita Rana.  The patient was given clear instructions to go to ER or return to medical center if symptoms don't improve, worsen or new problems develop. The patient verbalized understanding. The patient was told to call to get lab results if they haven't heard anything in the next week.     Freeman Caldron, PA-C [facility] and [place], [place]   01/25/2018, 3:06 PM

## 2018-02-02 ENCOUNTER — Telehealth: Payer: Self-pay | Admitting: *Deleted

## 2018-02-02 NOTE — Telephone Encounter (Signed)
Patient's daughter called and scheduled a follow up appt for October  

## 2018-02-13 ENCOUNTER — Encounter: Payer: Self-pay | Admitting: Gastroenterology

## 2018-02-13 ENCOUNTER — Ambulatory Visit (INDEPENDENT_AMBULATORY_CARE_PROVIDER_SITE_OTHER): Payer: Self-pay | Admitting: Gastroenterology

## 2018-02-13 VITALS — BP 100/60 | HR 86 | Ht <= 58 in | Wt 118.2 lb

## 2018-02-13 DIAGNOSIS — R109 Unspecified abdominal pain: Secondary | ICD-10-CM

## 2018-02-13 DIAGNOSIS — Z1211 Encounter for screening for malignant neoplasm of colon: Secondary | ICD-10-CM

## 2018-02-13 DIAGNOSIS — K5909 Other constipation: Secondary | ICD-10-CM

## 2018-02-13 NOTE — Patient Instructions (Signed)
You have been scheduled for a colonoscopy. Please follow written instructions given to you at your visit today.  Please pick up your prep supplies at the pharmacy within the next 1-3 days. If you use inhalers (even only as needed), please bring them with you on the day of your procedure.   Take Miralax 1 capful daily  Use Benefiber 1 teaspoon three times a day with meals  We have given you a sample Suprep kit to use for your prep    Constipation, Adult Constipation is when a person:  Poops (has a bowel movement) fewer times in a week than normal.  Has a hard time pooping.  Has poop that is dry, hard, or bigger than normal.  Follow these instructions at home: Eating and drinking   Eat foods that have a lot of fiber, such as: ? Fresh fruits and vegetables. ? Whole grains. ? Beans.  Eat less of foods that are high in fat, low in fiber, or overly processed, such as: ? [place] fries. ? Hamburgers. ? Cookies. ? Candy. ? Soda.  Drink enough fluid to keep your pee (urine) clear or pale yellow. General instructions  Exercise regularly or as told by your doctor.  Go to the restroom when you feel like you need to poop. Do not hold it in.  Take over-the-counter and prescription medicines only as told by your doctor. These include any fiber supplements.  Do pelvic floor retraining exercises, such as: ? Doing deep breathing while relaxing your lower belly (abdomen). ? Relaxing your pelvic floor while pooping.  Watch your condition for any changes.  Keep all follow-up visits as told by your doctor. This is important. Contact a doctor if:  You have pain that gets worse.  You have a fever.  You have not pooped for 4 days.  You throw up (vomit).  You are not hungry.  You lose weight.  You are bleeding from the anus.  You have thin, pencil-like poop (stool). Get help right away if:  You have a fever, and your symptoms suddenly get worse.  You leak poop or have  blood in your poop.  Your belly feels hard or bigger than normal (is bloated).  You have very bad belly pain.  You feel dizzy or you faint. This information is not intended to replace advice given to you by your health care provider. Make sure you discuss any questions you have with your health care provider. Document Released: 10/27/2007 Document Revised: 11/28/2015 Document Reviewed: 10/29/2015 Elsevier Interactive Patient Education  [employer].  If you are age 80 or older, your body mass index should be between 23-30. Your Body mass index is 24.7 kg/m. If this is out of the aforementioned range listed, please consider follow up with your Primary Care Provider.  If you are age 63 or younger, your body mass index should be between 19-25. Your Body mass index is 24.7 kg/m. If this is out of the aformentioned range listed, please consider follow up with your Primary Care Provider.    Thank you for choosing [facility]  Karleen Hampshire Nandigam,MD

## 2018-02-13 NOTE — Progress Notes (Signed)
Crystal [name]    [id number]    [birth year]  Primary Care Physician:Newlin, Charlane Ferretti, MD  Referring Physician: Argentina Donovan, PA-C [place], [place] [address]  Chief complaint: Back pain, constipation  HPI: 47 year old Spanish-speaking, accompanied by language interpreter here for follow-up visit.  Patient complains of back pain radiating up to her shoulders and neck.  She also sometimes has pain radiating onto her abdomen.  She works as Systems analyst houses.  She is having irregular bowel habits with bowel movement 3-4 times a week, feels constipation is getting progressively worse.  Abdominal discomfort is worse when she does not have a bowel movement.  Denies any vomiting, dark stool, mucus or blood in stool.  No weight loss or loss of appetite.  No family history of colon cancer.   Outpatient Encounter Medications as of 02/13/2018  Medication Sig  . alum & mag hydroxide-simeth (MAALOX/MYLANTA) 200-200-20 MG/5ML suspension Take by mouth every 6 (six) hours as needed for indigestion or heartburn.  . Naproxen Sodium (FLANAX PAIN RELIEF PO) Take 220 mg by mouth as needed.  Marland Kitchen tiZANidine (ZANAFLEX) 4 MG tablet Take 1 tablet (4 mg total) by mouth every 8 (eight) hours as needed for muscle spasms.  . [DISCONTINUED] linaclotide (LINZESS) 72 MCG capsule Take 1 capsule (72 mcg total) by mouth daily before breakfast.  . pantoprazole (PROTONIX) 20 MG tablet Take 1 tablet (20 mg total) by mouth daily.  . [DISCONTINUED] lidocaine (LIDODERM) 5 % Place 1 patch onto the skin daily. Remove & Discard patch within 12 hours or as directed by MD (Patient not taking: Reported on 02/13/2018)  . [DISCONTINUED] methocarbamol (ROBAXIN) 500 MG tablet Take 1 tablet (500 mg total) by mouth every 8 (eight) hours as needed for muscle spasms. (Patient not taking: Reported on 02/13/2018)  . [DISCONTINUED] naproxen (NAPROSYN) 500 MG tablet Take 1 tablet (500 mg total) by  mouth 2 (two) times daily with a meal. (Patient not taking: Reported on 02/13/2018)  . [DISCONTINUED] Vitamin D, Ergocalciferol, (DRISDOL) 50000 units CAPS capsule Take 1 capsule (50,000 Units total) by mouth every 7 (seven) days. (Patient not taking: Reported on 02/13/2018)   Facility-Administered Encounter Medications as of 02/13/2018  Medication  . 0.9 %  sodium chloride infusion    Allergies as of 02/13/2018  . (No Known Allergies)    Past Medical History:  Diagnosis Date  . Cancer ([place])    cervical cancer dx.   . History of kidney stones    x1   . Kidney stones    cyst on kidney -no problemsm hx of kidney stones  . 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 PONV (postoperative nausea and vomiting)     Past Surgical History:  Procedure Laterality Date  . BREAST BIOPSY Left 08/28/2015   MRI- High Risk  . BREAST BIOPSY Right 08/28/2015   MRI- Benign  . BREAST EXCISIONAL BIOPSY Left 10/03/2015  . BREAST LUMPECTOMY WITH RADIOACTIVE SEED LOCALIZATION Left 10/03/2015   Procedure: LEFT BREAST LUMPECTOMY WITH RADIOACTIVE SEED LOCALIZATION;  Surgeon: Fanny Skates, MD;  Location: [facility];  Service: General;  Laterality: Left;  . CERVICAL BIOPSY  W/ LOOP ELECTRODE EXCISION     12'16  . CESAREAN SECTION     2 previous  . ROBOTIC ASSISTED TOTAL HYSTERECTOMY Bilateral 07/01/2015   Procedure: XI ROBOTIC ASSISTED RADICAL TYPE II HYSTERECTOMY, BILATERAL SALPINGECTOMY, SENTINAL LYMPH  NODE BIOPSY;  Surgeon: Everitt Amber, MD;  Location: [facility];  Service: Gynecology;  Laterality: Bilateral;    Family History  Problem Relation Age of Onset  . Hypertension Sister   . Colon cancer Neg Hx   . Esophageal cancer Neg Hx   . Rectal cancer Neg Hx   . Stomach cancer Neg Hx     Social History   Socioeconomic History  . Marital status: Widowed    Spouse name: Not on file  . Number of children: 2  . Years of education: Not on file  . Highest  education level: Not on file  Occupational History  . Not on file  Social Needs  . Financial resource strain: Not on file  . Food insecurity:    Worry: Not on file    Inability: Not on file  . Transportation needs:    Medical: Not on file    Non-medical: Not on file  Tobacco Use  . Smoking status: Never Smoker  . Smokeless tobacco: Never Used  Substance and Sexual Activity  . Alcohol use: Yes    Comment: occasionally  . Drug use: No  . Sexual activity: Not Currently    Birth control/protection: Surgical  Lifestyle  . Physical activity:    Days per week: Not on file    Minutes per session: Not on file  . Stress: Not on file  Relationships  . Social connections:    Talks on phone: Not on file    Gets together: Not on file    Attends religious service: Not on file    Active member of club or organization: Not on file    Attends meetings of clubs or organizations: Not on file    Relationship status: Not on file  . Intimate partner violence:    Fear of current or ex partner: Not on file    Emotionally abused: Not on file    Physically abused: Not on file    Forced sexual activity: Not on file  Other Topics Concern  . Not on file  Social History Narrative  . Not on file      Review of systems: Review of Systems  Constitutional: Negative for fever and chills.  HENT: Negative.   Eyes: Negative for blurred vision.  Respiratory: Negative for cough, shortness of breath and wheezing.   Cardiovascular: Negative for chest pain and palpitations.  Gastrointestinal: as per HPI Genitourinary: Negative for dysuria, urgency, frequency and hematuria.  Musculoskeletal: Negative for myalgias, back pain and joint pain.  Skin: Negative for itching and rash.  Neurological: Negative for dizziness, tremors, focal weakness, seizures and loss of consciousness.  Endo/Heme/Allergies: Positive for seasonal allergies.  Psychiatric/Behavioral: Negative for depression, suicidal ideas and  hallucinations.  All other systems reviewed and are negative.   Physical Exam: Vitals:   02/13/18 1511  BP: 100/60  Pulse: 86   Body mass index is 24.7 kg/m. Gen:      No acute distress HEENT:  EOMI, sclera anicteric Neck:     No masses; no thyromegaly Lungs:    Clear to auscultation bilaterally; normal respiratory effort CV:         Regular rate and rhythm; no murmurs Abd:      + bowel sounds; soft, non-tender; no palpable masses, no distension Ext:    No edema; adequate peripheral perfusion Skin:      Warm and dry; no rash Neuro: alert and oriented x 3 Psych: normal mood and affect  Data Reviewed:  Reviewed  labs, radiology imaging, old records and pertinent past GI work up   Assessment and Plan/Recommendations:  47 year old female with complaints of worsening constipation, abdominal pain and back pain radiating to the neck. Her financial assistance will and within the next few months and patient is requesting colonoscopy for colorectal cancer screening while her insurance and financial assistance is current.  Will schedule colonoscopy The risks and benefits as well as alternatives of endoscopic procedure(s) have been discussed and reviewed. All questions answered. The patient agrees to proceed. Start MiraLAX 1 capful daily, titrate up or down based on response to have 1-2 soft bowel movements daily. Benefiber 1 teaspoon 3 times daily with meals. Advised patient to discuss with PMD for further evaluation and management of back pain    K. Denzil Magnuson , MD [phone]    CC: Argentina Donovan, PA-C

## 2018-02-20 ENCOUNTER — Encounter: Payer: Self-pay | Admitting: Gastroenterology

## 2018-03-01 ENCOUNTER — Ambulatory Visit (AMBULATORY_SURGERY_CENTER): Payer: Self-pay | Admitting: Gastroenterology

## 2018-03-01 ENCOUNTER — Encounter: Payer: Self-pay | Admitting: Gastroenterology

## 2018-03-01 VITALS — BP 115/56 | HR 62 | Temp 98.2°F | Resp 13 | Ht <= 58 in | Wt 118.0 lb

## 2018-03-01 DIAGNOSIS — D124 Benign neoplasm of descending colon: Secondary | ICD-10-CM

## 2018-03-01 DIAGNOSIS — R1084 Generalized abdominal pain: Secondary | ICD-10-CM

## 2018-03-01 DIAGNOSIS — Z1211 Encounter for screening for malignant neoplasm of colon: Secondary | ICD-10-CM

## 2018-03-01 MED ORDER — LUBIPROSTONE 24 MCG PO CAPS: 24.0000 ug | ORAL_CAPSULE | Freq: Two times a day (BID) | ORAL | 3 refills | Status: DC

## 2018-03-01 MED ORDER — SODIUM CHLORIDE 0.9 % IV SOLN: 500.0000 mL | Freq: Once | INTRAVENOUS | Status: DC

## 2018-03-01 NOTE — Op Note (Signed)
[facility] Patient Name: Crystal [name] Procedure Date: 03/01/2018 2:09 PM MRN: [id number] Endoscopist: Mauri Pole , MD Age: 47 Referring MD:  Date of Birth: [birth year] Gender: Female Account #: [id number] Procedure:                Colonoscopy Indications:              Generalized abdominal pain. Screening for                            colorectal cancer. Medicines:                Monitored Anesthesia Care Procedure:                Pre-Anesthesia Assessment:                           - Prior to the procedure, a History and Physical                            was performed, and patient medications and                            allergies were reviewed. The patient's tolerance of                            previous anesthesia was also reviewed. The risks                            and benefits of the procedure and the sedation                            options and risks were discussed with the patient.                            All questions were answered, and informed consent                            was obtained. Prior Anticoagulants: The patient has                            taken no previous anticoagulant or antiplatelet                            agents. ASA Grade Assessment: II - A patient with                            mild systemic disease. After reviewing the risks                            and benefits, the patient was deemed in                            satisfactory condition to undergo the procedure.  After obtaining informed consent, the colonoscope                            was passed under direct vision. Throughout the                            procedure, the patient's blood pressure, pulse, and                            oxygen saturations were monitored continuously. The                            Colonoscope was introduced through the anus and                            advanced to the the cecum, identified  by                            appendiceal orifice and ileocecal valve. The                            colonoscopy was performed without difficulty. The                            patient tolerated the procedure well. The quality                            of the bowel preparation was excellent. The                            ileocecal valve, appendiceal orifice, and rectum                            were photographed. Scope In: 2:19:48 PM Scope Out: 2:34:41 PM Scope Withdrawal Time: 0 hours 10 minutes 4 seconds  Total Procedure Duration: 0 hours 14 minutes 53 seconds  Findings:                 The perianal and digital rectal examinations were                            normal.                           A 6 mm polyp was found in the descending colon. The                            polyp was sessile. The polyp was removed with a                            cold snare. Resection and retrieval were complete.                           Non-bleeding internal hemorrhoids were found during  retroflexion. The hemorrhoids were small. Complications:            No immediate complications. Estimated Blood Loss:     Estimated blood loss was minimal. Impression:               - One 6 mm polyp in the descending colon, removed                            with a cold snare. Resected and retrieved.                           - Non-bleeding internal hemorrhoids. Recommendation:           - Patient has a contact number available for                            emergencies. The signs and symptoms of potential                            delayed complications were discussed with the                            patient. Return to normal activities tomorrow.                            Written discharge instructions were provided to the                            patient.                           - Resume previous diet.                           - Continue present medications.                            - Await pathology results.                           - Repeat colonoscopy in 5-10 years for surveillance                            based on pathology results. Mauri Pole, MD 03/01/2018 2:43:24 PM This report has been signed electronically.

## 2018-03-01 NOTE — Patient Instructions (Addendum)
YOU HAD AN ENDOSCOPIC PROCEDURE TODAY AT [facility]:   Refer to the procedure report that was given to you for any specific questions about what was found during the examination.  If the procedure report does not answer your questions, please call your gastroenterologist to clarify.  If you requested that your care partner not be given the details of your procedure findings, then the procedure report has been included in a sealed envelope for you to review at your convenience later.  YOU SHOULD EXPECT: Some feelings of bloating in the abdomen. Passage of more gas than usual.  Walking can help get rid of the air that was put into your GI tract during the procedure and reduce the bloating. If you had a lower endoscopy (such as a colonoscopy or flexible sigmoidoscopy) you may notice spotting of blood in your stool or on the toilet paper. If you underwent a bowel prep for your procedure, you may not have a normal bowel movement for a few days.  Please Note:  You might notice some irritation and congestion in your nose or some drainage.  This is from the oxygen used during your procedure.  There is no need for concern and it should clear up in a day or so.  SYMPTOMS TO REPORT IMMEDIATELY:   Following lower endoscopy (colonoscopy or flexible sigmoidoscopy):  Excessive amounts of blood in the stool  Significant tenderness or worsening of abdominal pains  Swelling of the abdomen that is new, acute  Fever of 100F or higher  For urgent or emergent issues, a gastroenterologist can be reached at any hour by calling [phone].   DIET:  We do recommend a small meal at first, but then you may proceed to your regular diet.  Drink plenty of fluids but you should avoid alcoholic beverages for 24 hours.  MEDICATIONS: Continue present medications. Start Amitiza 24 mcg by mouth twice daily for constipation. Prescription printed out and given to patient at patient's request.  Please see  handouts given to you by your recovery nurse.  ACTIVITY:  You should plan to take it easy for the rest of today and you should NOT DRIVE or use heavy machinery until tomorrow (because of the sedation medicines used during the test).    FOLLOW UP: Our staff will call the number listed on your records the next business day following your procedure to check on you and address any questions or concerns that you may have regarding the information given to you following your procedure. If we do not reach you, we will leave a message.  However, if you are feeling well and you are not experiencing any problems, there is no need to return our call.  We will assume that you have returned to your regular daily activities without incident.  If any biopsies were taken you will be contacted by phone or by letter within the next 1-3 weeks.  Please call us at [phone] if you have not heard about the biopsies in 3 weeks.   Thank you for allowing [place] to provide for your healthcare needs today.   SIGNATURES/CONFIDENTIALITY: You and/or your care partner have signed paperwork which will be entered into your electronic medical record.  These signatures attest to the fact that that the information above on your After Visit Summary has been reviewed and is understood.  Full responsibility of the confidentiality of this discharge information lies with you and/or your care-partner.[facility]:   Lea el informe del procedimiento que se le entreg para cualquier pregunta especfica sobre lo que se Primary school teacher.  Si el informe del examen no responde a sus preguntas, por favor llame a su gastroenterlogo para aclararlo.  Si usted solicit que no se le den [employer] de lo que se Estate manager/land agent en su procedimiento al [employer] va a cuidar, entonces el informe del procedimiento se ha incluido en un sobre sellado para que usted lo revise despus cuando  le sea ms conveniente.   LO QUE PUEDE ESPERAR: Algunas sensaciones de hinchazn en el abdomen.  Puede tener ms gases de lo normal.  El caminar puede ayudarle a eliminar el aire que se le puso en el tracto gastrointestinal durante el procedimiento y reducir la hinchazn.  Si le hicieron una endoscopia inferior (como una colonoscopia o una sigmoidoscopia flexible), podra notar manchas de sangre en las heces fecales o en el papel higinico.  Si se someti a una preparacin intestinal para su procedimiento, es posible que no tenga una evacuacin intestinal normal durante [employer].   Tenga en cuenta:  Es posible que note un poco de irritacin y congestin en la nariz o algn drenaje.  Esto es debido al oxgeno [employer] durante su procedimiento.  No hay que preocuparse y esto debe desaparecer ms o Scientist, research (medical).   SNTOMAS PARA REPORTAR INMEDIATAMENTE:  Despus de una endoscopia inferior (colonoscopia o sigmoidoscopia flexible):  Cantidades excesivas de sangre en las heces fecales  Sensibilidad significativa o empeoramiento de los dolores abdominales   Hinchazn aguda del abdomen que antes no tena   Fiebre de 100F o ms   Despus de la endoscopia superior (EGD)  Vmitos de Biochemist, clinical o material como caf molido   Dolor en el pecho o dolor debajo de los omplatos que antes no tena   Dolor o dificultad persistente para tragar  Falta de aire que antes no tena   Fiebre de 100F o ms  Heces fecales negras y pegajosas   Para asuntos urgentes o de Freight forwarder, puede comunicarse con un gastroenterlogo a cualquier hora llamando al [phone].  DIETA:  Recomendamos una comida pequea al principio, pero luego puede continuar con su dieta normal.  Tome muchos lquidos, Teacher, adult education las bebidas alcohlicas durante 24 horas.    ACTIVIDAD:  Debe planear tomarse las cosas con calma por el resto del da y no debe CONDUCIR ni usar maquinaria pesada Programmer, applications (debido a los medicamentos de  sedacin utilizados durante el examen).     SEGUIMIENTO: Nuestro personal llamar al nmero que aparece en su historial al siguiente da hbil de su procedimiento para ver cmo se siente y para responder cualquier pregunta o inquietud que pueda tener con respecto a la informacin que se le dio despus del procedimiento. Si no podemos contactarle, le dejaremos un mensaje.  Sin embargo, si se siente bien y no tiene Paediatric nurse, no es necesario que nos devuelva la llamada.  Asumiremos que ha regresado a sus actividades diarias normales sin incidentes. Si se le tomaron algunas biopsias, le contactaremos por telfono o por carta en las prximas 3 semanas.  Si no ha sabido [employer] biopsias en el transcurso de 3 semanas, por favor llmenos al [phone].   FIRMAS/CONFIDENCIALIDAD: Usted y/o el acompaante que le cuide han firmado documentos que se ingresarn en su historial mdico electrnico.  Estas firmas atestiguan el hecho de que la informacin anterior

## 2018-03-01 NOTE — Progress Notes (Signed)
Alert and oriented x3, pleased with MAC, report to RN  

## 2018-03-01 NOTE — Progress Notes (Signed)
Called to room to assist during endoscopic procedure.  Patient ID and intended procedure confirmed with present staff. Received instructions for my participation in the procedure from the performing physician.  

## 2018-03-02 ENCOUNTER — Telehealth: Payer: Self-pay | Admitting: *Deleted

## 2018-03-02 ENCOUNTER — Telehealth: Payer: Self-pay

## 2018-03-02 NOTE — Telephone Encounter (Signed)
  Follow up Call-  Call back number 03/01/2018 03/01/2018 05/12/2017  Post procedure Call Back phone  # [phone] speak with daugher Colletta [place]  Permission to leave phone message - Yes Yes  Some recent data might be hidden     Patient questions:  Do you have a fever, pain , or abdominal swelling? No. Pain Score  0 *  Have you tolerated food without any problems? Yes.    Have you been able to return to your normal activities? Yes.    Do you have any questions about your discharge instructions: Diet   No. Medications  No. Follow up visit  No.  Do you have questions or concerns about your Care? No.  Actions: * If pain score is 4 or above: No action needed, pain <4.

## 2018-03-02 NOTE — Telephone Encounter (Signed)
  Follow up Call-  Call back number 03/01/2018 03/01/2018 05/12/2017  Post procedure Call Back phone  # [phone] speak with daugher Crystal [name]  Permission to leave phone message - Yes Yes  Some recent data might be hidden     No ID on voicemail

## 2018-03-06 ENCOUNTER — Inpatient Hospital Stay: Payer: Self-pay | Attending: Gynecologic Oncology | Admitting: Gynecologic Oncology

## 2018-03-06 ENCOUNTER — Encounter: Payer: Self-pay | Admitting: Gynecologic Oncology

## 2018-03-06 VITALS — BP 96/67 | HR 81 | Temp 98.0°F | Resp 18 | Ht 59.0 in | Wt 114.3 lb

## 2018-03-06 DIAGNOSIS — Z90722 Acquired absence of ovaries, bilateral: Secondary | ICD-10-CM | POA: Insufficient documentation

## 2018-03-06 DIAGNOSIS — C539 Malignant neoplasm of cervix uteri, unspecified: Secondary | ICD-10-CM | POA: Insufficient documentation

## 2018-03-06 DIAGNOSIS — Z90710 Acquired absence of both cervix and uterus: Secondary | ICD-10-CM | POA: Insufficient documentation

## 2018-03-06 DIAGNOSIS — R102 Pelvic and perineal pain: Secondary | ICD-10-CM | POA: Insufficient documentation

## 2018-03-06 NOTE — Progress Notes (Signed)
Follow Up Note: Gyn-Onc  Crystal [name] 47 y.o. female  CC:  Chief Complaint  Patient presents with  . Malignant neoplasm of cervix, unspecified site [facility])   Assessment/Plan:  47 year old s/p robotic-assisted type III radical laparoscopic hysterectomy with bilateral salpingectomy and bilateral sentinel lymph node biopsy on 07/01/15 for stage IB1 cervical SCC.  No residual carcinoma on hysterectomy specimen. Low risk features to her tumor therefore no adjuvant therapy recommended. No evidence for recurrence on today's exam. Pelvic pain bilaterally - unclear etiology on exam  1/ we will continue annual pap surveillance for this with hpv cotesting (next due in March/April, 2020).  2/ I discussed symptoms concerning for recurrence including vaginal bleeding or discharge, pelvic pain, cough, or lower extremity edema.  3/ Follow-up for surveillance 6 monthly.   4/ pelvic pain - pelvic US  HPI: Crystal [name] is a 47 year old female initially referred by Dr. 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  She had her first abnormal Pap smear on 02/26/2015 which was HGSIL. She then underwent colposcopic evaluation of the cervix on 04/02/2015, which was benign in appearance. A biopsy from the ectocervix was benign and endocervical curetting showed CIN-3. As follow-up, she underwent a LEEP procedure on 05/12/2015. 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 Dimensions of the carcinoma were not included however there was a focus of dysplasia suspicious for invasive carcinoma involving the deep margin.  PET on 06/20/15: IMPRESSION: 1. No primary hypermetabolic primary cervical mass or evidence of nodal metastasis. 2. Focal hypermetabolism in the lateral left breast. No CT correlate. Consider correlation with diagnostic mammography and ultrasound to exclude otherwise occult breast lesion. 3.  Probable left nephrolithiasis  On 07/01/15, she underwent a robotic-assisted type III radical laparoscopic hysterectomy with bilateral salpingoophorectomy and bilateral pelvic lymphadenectomy by Dr. Denman George.  Final pathology revealed: Diagnosis 1. Uterus +/- tubes/ovaries, neoplastic, cervix CERVIX WITH LOW GRADE SQUAMOUS INTRAEPITHELIAL LESION, CIN-1 NO RESIDUAL HIGH GRADE INTRAEPITHELIAL LESION OR INVASIVE NEOPLASM IDENTIFIED VAGINAL CUFF, PARAMETRIAL AND ALL RESECTION MARGINS ARE NEGATIVE FOR TUMOR ENDOMETRIAL POLYP AND INACTIVE ENDOMETRIUM MYOMETRIUM: NO PATHOLOGICAL ALTERATIONS BILATERAL FALLOPIAN TUBES: HISTOLOGICAL UNREMARKABLE 2. Lymph node, sentinel, biopsy, right external iliac ONE BENIGN LYMPH NODE (0/1) 3. Lymph node, sentinel, biopsy, left obturator ONE BENIGN LYMPH NODE (0/1)  She was re-admitted to the hospital on POD 7, 07/08/15, for persistent nausea, vomiting, dehydration, no BM, dyspnea.  CT CAP on 07/08/15 failed to demonstrate evidence for intraperitoneal abscess/GU or GI injury or pathology including no obstruction. UA and culture revealed a catheter associated UTI (e coli - pan sensitive) which was treated with cipro. She was discharged home on 07/10/15 after IV hydration, aggressive bowel regimen.  She failed a voiding trial during her hospitalization and the foley had to be replaced due to urinary retention.  She presented to the office on 07/15/15 for a repeat voiding trial. She passed this well and her foley was left out, however she subsequently developed urinary retention and required teaching to intermittently self catheterize.  On 10/03/2015 she underwent a breast lumpectomy on the left which confirmed lobular hyperplasia but no carcinoma in situ identified. No additional therapy is required for this.  Interval History:   She denies vaginal bleeding, LE edema.   She was worked up for epigastric pain by GI in December, 2018 with an EGD which confirmed GERD.  She  had CT abd/pelvis in March, 2019  for flank pain which showed no evidence of recurrent cancer.    Review of Systems  Constitutional: Feels well.  No fever, chills, early satiety, or unintentional weight loss or gain.  Cardiovascular: No chest pain, shortness of breath, or edema.  Pulmonary: coughs up blood Gastrointestinal: No nausea, vomiting, or diarrhea. No bright red blood per rectum or change in bowel movement. + spits up blood intermittently Genitourinary: Minimal bladder sensation. No vaginal bleeding or discharge.  Musculoskeletal: No myalgia or joint pain. Neurologic: No weakness, numbness, or change in gait.  Psychology: No depression, anxiety, or insomnia.  Current Meds:  Outpatient Encounter Medications as of 03/06/2018  Medication Sig  . alum & mag hydroxide-simeth (MAALOX/MYLANTA) 200-200-20 MG/5ML suspension Take by mouth every 6 (six) hours as needed for indigestion or heartburn.  . bisacodyl (DULCOLAX) 5 MG EC tablet Take 5 mg by mouth daily as needed for moderate constipation.  . Naproxen Sodium (FLANAX PAIN RELIEF PO) Take 220 mg by mouth as needed.  Marland Kitchen tiZANidine (ZANAFLEX) 4 MG tablet Take 1 tablet (4 mg total) by mouth every 8 (eight) hours as needed for muscle spasms. (Patient not taking: Reported on 03/01/2018)  . [DISCONTINUED] lubiprostone (AMITIZA) 24 MCG capsule Take 1 capsule (24 mcg total) by mouth 2 (two) times daily with a meal.  . [DISCONTINUED] pantoprazole (PROTONIX) 20 MG tablet Take 1 tablet (20 mg total) by mouth daily.   Facility-Administered Encounter Medications as of 03/06/2018  Medication  . 0.9 %  sodium chloride infusion  . 0.9 %  sodium chloride infusion    Allergy: No Known Allergies  Social Hx:   Social History   Socioeconomic History  . Marital status: Widowed    Spouse name: Not on file  . Number of children: 2  . Years of education: Not on file  . Highest education level: Not on file  Occupational History  . Not on file  Social  Needs  . Financial resource strain: Not on file  . Food insecurity:    Worry: Not on file    Inability: Not on file  . Transportation needs:    Medical: Not on file    Non-medical: Not on file  Tobacco Use  . Smoking status: Never Smoker  . Smokeless tobacco: Never Used  Substance and Sexual Activity  . Alcohol use: Yes    Comment: occasionally  . Drug use: No  . Sexual activity: Not Currently    Birth control/protection: Surgical  Lifestyle  . Physical activity:    Days per week: Not on file    Minutes per session: Not on file  . Stress: Not on file  Relationships  . Social connections:    Talks on phone: Not on file    Gets together: Not on file    Attends religious service: Not on file    Active member of club or organization: Not on file    Attends meetings of clubs or organizations: Not on file    Relationship status: Not on file  . Intimate partner violence:    Fear of current or ex partner: Not on file    Emotionally abused: Not on file    Physically abused: Not on file    Forced sexual activity: Not on file  Other Topics Concern  . Not on file  Social History Narrative  . Not on file    Past Surgical Hx:  Past Surgical History:  Procedure Laterality Date  . BREAST BIOPSY Left 08/28/2015   MRI- High  Risk  . BREAST BIOPSY Right 08/28/2015   MRI- Benign  . BREAST EXCISIONAL BIOPSY Left 10/03/2015  . BREAST LUMPECTOMY WITH RADIOACTIVE SEED LOCALIZATION Left 10/03/2015   Procedure: LEFT BREAST LUMPECTOMY WITH RADIOACTIVE SEED LOCALIZATION;  Surgeon: Fanny Skates, MD;  Location: [place];  Service: General;  Laterality: Left;  . CERVICAL BIOPSY  W/ LOOP ELECTRODE EXCISION     12'16  . CESAREAN SECTION     2 previous  . ROBOTIC ASSISTED TOTAL HYSTERECTOMY Bilateral 07/01/2015   Procedure: XI ROBOTIC ASSISTED RADICAL TYPE II HYSTERECTOMY, BILATERAL SALPINGECTOMY, SENTINAL LYMPH NODE BIOPSY;  Surgeon: Everitt Amber, MD;  Location: [facility];  Service:  Gynecology;  Laterality: Bilateral;    Past Medical Hx:  Past Medical History:  Diagnosis Date  . Allergy   . Cancer ([place])    cervical cancer dx.   . History of kidney stones    x1   . Kidney stones    cyst on kidney -no problemsm hx of kidney stones  . 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 PONV (postoperative nausea and vomiting)     Family Hx:  Family History  Problem Relation Age of Onset  . Hypertension Sister   . Colon cancer Neg Hx   . Esophageal cancer Neg Hx   . Rectal cancer Neg Hx   . Stomach cancer Neg Hx     Vitals:  Blood pressure 96/67, pulse 81, temperature 98 F (36.7 C), temperature source Oral, resp. rate 18, height 4\' 11"  (1.499 m), weight 114 lb 4.8 oz (51.8 kg), last menstrual period 06/07/2015, SpO2 98 %.  Physical Exam:  General: Well developed, well nourished female in no acute distress. Alert and oriented x 3.  Cardiovascular: Regular rate and rhythm. S1 and S2 normal.  Lungs: Clear to auscultation bilaterally. No wheezes/crackles/rhonchi noted.  Skin: No rashes or lesions present. Back: No CVA tenderness.  Abdomen: Abdomen soft, non-tender and non-obese. Active bowel sounds in all quadrants. No evidence of a fluid wave or abdominal masses.  Lap sites soft. Extremities: No bilateral cyanosis, edema, or clubbing.  Pelvic exam: No residual granulation tissue at cuff. No nodulariy or lesions. Rectal: smooth, no nodularity or masses   Thereasa Solo, MD  03/06/2018, 4:06 PM

## 2018-03-06 NOTE — Patient Instructions (Signed)
Please notify Dr Denman George at phone number [phone] if you notice vaginal bleeding, new pelvic or abdominal pains, bloating, feeling full easy, or a change in bladder or bowel function.   Please return to see Dr Denman George in April, 2020. If you call her office in or after November, 2019 you will be able to schedule that appointment with her office. She will perform at a pap smear at that time.

## 2018-03-07 ENCOUNTER — Encounter: Payer: Self-pay | Admitting: Gastroenterology

## 2018-03-15 ENCOUNTER — Ambulatory Visit: Payer: Self-pay | Attending: Family Medicine | Admitting: Family Medicine

## 2018-03-15 VITALS — BP 115/69 | HR 83 | Temp 97.8°F | Ht 59.0 in | Wt 115.0 lb

## 2018-03-15 DIAGNOSIS — F419 Anxiety disorder, unspecified: Secondary | ICD-10-CM | POA: Insufficient documentation

## 2018-03-15 DIAGNOSIS — N281 Cyst of kidney, acquired: Secondary | ICD-10-CM | POA: Insufficient documentation

## 2018-03-15 DIAGNOSIS — R1011 Right upper quadrant pain: Secondary | ICD-10-CM | POA: Insufficient documentation

## 2018-03-15 DIAGNOSIS — M549 Dorsalgia, unspecified: Secondary | ICD-10-CM | POA: Insufficient documentation

## 2018-03-15 DIAGNOSIS — R109 Unspecified abdominal pain: Secondary | ICD-10-CM

## 2018-03-15 DIAGNOSIS — K2900 Acute gastritis without bleeding: Secondary | ICD-10-CM | POA: Insufficient documentation

## 2018-03-15 LAB — POCT URINALYSIS DIP (CLINITEK)
Bilirubin, UA: NEGATIVE
Blood, UA: NEGATIVE
Glucose, UA: NEGATIVE mg/dL
LEUKOCYTES UA: NEGATIVE
NITRITE UA: NEGATIVE
PH UA: 7 (ref 5.0–8.0)
PROTEIN: NEGATIVE
Spec Grav, UA: 1.02 (ref 1.010–1.025)
UROBILINOGEN UA: 0.2 U/dL

## 2018-03-15 MED ORDER — HYDROXYZINE HCL 25 MG PO TABS: 25.0000 mg | ORAL_TABLET | Freq: Three times a day (TID) | ORAL | 1 refills | Status: DC | PRN

## 2018-03-15 MED ORDER — OMEPRAZOLE 40 MG PO CPDR: 40.0000 mg | DELAYED_RELEASE_CAPSULE | Freq: Every day | ORAL | 3 refills | Status: DC

## 2018-03-15 MED ORDER — LUBIPROSTONE 24 MCG PO CAPS: 24.0000 ug | ORAL_CAPSULE | Freq: Two times a day (BID) | ORAL | 3 refills | Status: DC

## 2018-03-15 MED ORDER — TRAMADOL HCL 50 MG PO TABS: 50.0000 mg | ORAL_TABLET | Freq: Two times a day (BID) | ORAL | 0 refills | Status: DC | PRN

## 2018-03-15 NOTE — Progress Notes (Signed)
Subjective:  Patient ID: Crystal [name], female    DOB: [birth year]  Age: 47 y.o. MRN: [id number]  CC: Back Pain and Abdominal Pain   HPI Crystal [name] is a 47 year old woman with a history of GERD, Stage 1B cervical cancer status post robotic-assisted type III radical laparoscopic hysterectomy with bilateral salpingectomy and bilateral sentinel lymph node biopsy in 06/2015 (no residual carcinoma as per GYN ) here for a follow-up visit. She complains of right flank pain which radiates anteriorly to her right upper quadrant and states this has been present for the last 4 months.  Use of a muscle relaxant tizanidine has provided no relief.  This has caused her much anxiety and she is wanting a treatment for this anxiety. She does have epigastric burning restarted after colonoscopy and this is worse at night, also has associated nausea but no vomiting or diarrhea.  She has constipation and was prescribed Amitiza by GI but has been unable to obtain due to cost. Right upper quadrant ultrasound from 07/2017 was unremarkable but CT abdomen and pelvis revealed a 9 mm simple cyst in the upper pole of the right kidney. She denies urinary symptoms.   Past Medical History:  Diagnosis Date  . Allergy   . Cancer ([place])    cervical cancer dx.   . History of kidney stones    x1   . Kidney stones    cyst on kidney -no problemsm hx of kidney stones  . 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 PONV (postoperative nausea and vomiting)     Past Surgical History:  Procedure Laterality Date  . BREAST BIOPSY Left 08/28/2015   MRI- High Risk  . BREAST BIOPSY Right 08/28/2015   MRI- Benign  . BREAST EXCISIONAL BIOPSY Left 10/03/2015  . BREAST LUMPECTOMY WITH RADIOACTIVE SEED LOCALIZATION Left 10/03/2015   Procedure: LEFT BREAST LUMPECTOMY WITH RADIOACTIVE SEED LOCALIZATION;  Surgeon: Fanny Skates, MD;  Location: [place];  Service:  General;  Laterality: Left;  . CERVICAL BIOPSY  W/ LOOP ELECTRODE EXCISION     12'16  . CESAREAN SECTION     2 previous  . ROBOTIC ASSISTED TOTAL HYSTERECTOMY Bilateral 07/01/2015   Procedure: XI ROBOTIC ASSISTED RADICAL TYPE II HYSTERECTOMY, BILATERAL SALPINGECTOMY, SENTINAL LYMPH NODE BIOPSY;  Surgeon: Everitt Amber, MD;  Location: [facility];  Service: Gynecology;  Laterality: Bilateral;    No Known Allergies   Outpatient Medications Prior to Visit  Medication Sig Dispense Refill  . alum & mag hydroxide-simeth (MAALOX/MYLANTA) 200-200-20 MG/5ML suspension Take by mouth every 6 (six) hours as needed for indigestion or heartburn.    . bisacodyl (DULCOLAX) 5 MG EC tablet Take 5 mg by mouth daily as needed for moderate constipation.    . Naproxen Sodium (FLANAX PAIN RELIEF PO) Take 220 mg by mouth as needed.    Marland Kitchen tiZANidine (ZANAFLEX) 4 MG tablet Take 1 tablet (4 mg total) by mouth every 8 (eight) hours as needed for muscle spasms. 90 tablet 1   Facility-Administered Medications Prior to Visit  Medication Dose Route Frequency Provider Last Rate Last Dose  . 0.9 %  sodium chloride infusion  500 mL Intravenous Once Nandigam, Kavitha V, MD      . 0.9 %  sodium chloride infusion  500 mL Intravenous Once Nandigam, Kavitha V, MD        ROS Review of Systems  Constitutional: Negative for activity change, appetite change and fatigue.  HENT: Negative for congestion, sinus pressure and sore throat.   Eyes: Negative for visual disturbance.  Respiratory: Negative for chest tightness, shortness of breath and wheezing.   Cardiovascular: Negative for chest pain and palpitations.  Gastrointestinal: Positive for abdominal pain. Negative for abdominal distention and constipation.  Endocrine: Negative for polydipsia.  Genitourinary: Negative for dysuria and frequency.  Musculoskeletal: Positive for back pain. Negative for arthralgias.  Skin: Negative for rash.  Neurological: Negative for tremors,  light-headedness and numbness.  Hematological: Does not bruise/bleed easily.  Psychiatric/Behavioral: Negative for agitation and behavioral problems.    Objective:  BP 115/69   Pulse 83   Temp 97.8 F (36.6 C) (Oral)   Ht '4\' 11"'$  (1.499 m)   Wt 115 lb (52.2 kg)   LMP 06/07/2015 (Exact Date)   SpO2 98%   BMI 23.23 kg/m   BP/Weight 03/15/2018 03/06/2018 51/11/6158  Systolic BP 737 96 106  Diastolic BP 69 67 56  Wt. (Lbs) 115 114.3 118  BMI 23.23 23.09 24.66      Physical Exam  Constitutional: She is oriented to person, place, and time. She appears well-developed and well-nourished.  Cardiovascular: Normal rate, normal heart sounds and intact distal pulses.  No murmur heard. Pulmonary/Chest: Effort normal and breath sounds normal. She has no wheezes. She has no rales. She exhibits no tenderness.  Abdominal: Soft. Bowel sounds are normal. She exhibits no distension and no mass. There is no tenderness.  Musculoskeletal: Normal range of motion.  Negative CVA tenderness  Neurological: She is alert and oriented to person, place, and time.  Skin: Skin is warm and dry.  Psychiatric: She has a normal mood and affect.     EXAM: ULTRASOUND ABDOMEN LIMITED RIGHT UPPER QUADRANT  COMPARISON:  CT abdomen and pelvis May 22, 2016  FINDINGS: Gallbladder:  No gallstones or wall thickening visualized. There is no pericholecystic fluid. No sonographic Murphy sign noted by sonographer.  Common bile duct:  Diameter: 2 mm. No intrahepatic or extrahepatic biliary duct dilatation.  Liver:  No focal lesion identified. Within normal limits in parenchymal echogenicity. Portal vein is patent on color Doppler imaging with normal direction of blood flow towards the liver.  IMPRESSION: Study within normal limits.   Electronically Signed   By: Lowella Grip III M.D.   On: 08/09/2017 13:26    CT ABDOMEN AND PELVIS WITH CONTRAST  TECHNIQUE: Multidetector CT  imaging of the abdomen and pelvis was performed using the standard protocol following bolus administration of intravenous contrast.  CONTRAST:  150m ISOVUE-300 IOPAMIDOL (ISOVUE-300) INJECTION 61%  COMPARISON:  May 22 2016  FINDINGS: Lower chest: Mild atelectasis of the lung bases are noted. The heart size is normal.  Hepatobiliary: No focal liver abnormality is seen. No gallstones, gallbladder wall thickening, or biliary dilatation.  Pancreas: Unremarkable. No pancreatic ductal dilatation or surrounding inflammatory changes.  Spleen: Normal in size without focal abnormality.  Adrenals/Urinary Tract: The adrenal glands are normal. There is a 9 mm simple cyst in the upper pole right kidney. The kidneys are otherwise normal. There is no hydronephrosis bilaterally. The bladder is normal.  Stomach/Bowel: Stomach is within normal limits. Appendix appears normal. There is no evidence of small bowel obstruction. There is question patchy thick walled transverse colon in a partial decompressed colon.  Vascular/Lymphatic: No significant vascular findings are present. No enlarged abdominal or pelvic lymph nodes.  Reproductive: Status post hysterectomy. Probable small recently ruptured right ovarian cysts are noted.  Other: None.  Musculoskeletal: No acute or significant  osseous findings.  IMPRESSION: Mild patchy thick wall transverse colon in a partially decompressed bladder. This may be due to infectious/inflammatory etiology.  Normal appendix.  Normal gallbladder.   Electronically Signed   By: Abelardo Diesel M.D.   On: 08/09/2017 16:49  Assessment & Plan:   1. Acquired cyst of kidney - CT Abdomen Pelvis W Contrast; Future  2. Anxiety Stems from her current pain Commence Hydroxyzine - hydrOXYzine (ATARAX/VISTARIL) 25 MG tablet; Take 1 tablet (25 mg total) by mouth 3 (three) times daily as needed.  Dispense: 60 tablet; Refill: 1  3. Right  flank pain Unknown etiology Suspicious for muscle spasm but given CT findings of renal cyst I will repeat abdominal imaging - traMADol (ULTRAM) 50 MG tablet; Take 1 tablet (50 mg total) by mouth every 12 (twelve) hours as needed.  Dispense: 30 tablet; Refill: 0 - CMP14+EGFR - POCT URINALYSIS DIP (CLINITEK)  4. Right upper quadrant abdominal pain RUQ sonogram from 07/2017 negative for gall bladder pathology - traMADol (ULTRAM) 50 MG tablet; Take 1 tablet (50 mg total) by mouth every 12 (twelve) hours as needed.  Dispense: 30 tablet; Refill: 0  5. Other acute gastritis, presence of bleeding unspecified - H. pylori breath test - omeprazole (PRILOSEC) 40 MG capsule; Take 1 capsule (40 mg total) by mouth daily.  Dispense: 30 capsule; Refill: 3   Meds ordered this encounter  Medications  . omeprazole (PRILOSEC) 40 MG capsule    Sig: Take 1 capsule (40 mg total) by mouth daily.    Dispense:  30 capsule    Refill:  3  . traMADol (ULTRAM) 50 MG tablet    Sig: Take 1 tablet (50 mg total) by mouth every 12 (twelve) hours as needed.    Dispense:  30 tablet    Refill:  0  . lubiprostone (AMITIZA) 24 MCG capsule    Sig: Take 1 capsule (24 mcg total) by mouth 2 (two) times daily with a meal.    Dispense:  60 capsule    Refill:  3  . hydrOXYzine (ATARAX/VISTARIL) 25 MG tablet    Sig: Take 1 tablet (25 mg total) by mouth 3 (three) times daily as needed.    Dispense:  60 tablet    Refill:  1    Follow-up: Return in about 1 month (around 04/15/2018) for Follow-up on abdominal and back pain.   Charlott Rakes MD

## 2018-03-15 NOTE — Patient Instructions (Signed)
Gastritis en los adultos (Gastritis, Adult) La gastritis es la irritacin del estmago. Hay dos tipos de gastritis:  Gastritis aguda. Este tipo aparece de manera repentina.  Gastritis crnica. Este tipo dura [employer]. La gastritis aparece cuando la membrana que recubre el estmago se debilita o se daa. Sin tratamiento, la gastritis puede causar hemorragias y lceras estomacales. CAUSAS Esta afeccin puede ser causada por lo siguiente:  Una infeccin.  Beber alcohol en exceso.  Ciertos medicamentos.  Tener demasiada cantidad de cido [employer].  Una enfermedad de los intestinos o del [place].  Estrs. SNTOMAS Los sntomas de esta afeccin incluyen lo siguiente:  Dolor o ardor en la parte superior del abdomen.  Nuseas.  Vmitos.  Sensacin molesta de distensin despus de comer. En algunos casos no hay sntomas. DIAGNSTICO Esta afeccin se puede diagnosticar a travs de lo siguiente:  Una descripcin de los sntomas.  Un examen fsico.  Estudios. Estos pueden incluir los siguientes: ? Anlisis de [place]. ? Pruebas de materia fecal. ? Una prueba en la que se introduce un instrumento delgado y flexible que tiene una luz y Ardelia Mems cmara en la punta a travs del esfago y Braden (endoscopia superior). ? Una prueba en la que se toma una muestra de tejido para Web designer (biopsia). TRATAMIENTO Esta afeccin puede tratarse con medicamentos. Si la afeccin es causada por una infeccin bacteriana, pueden darle antibiticos. Si es causada por demasiada cantidad de cido en el estmago, pueden darle medicamentos llamados bloqueadores H2, inhibidores de la bomba de protones o anticidos. El tratamiento tambin puede incluir la suspensin del uso de ciertos medicamentos, como la aspirina, el ibuprofeno u otros antiinflamatorios no esteroides (AINE). [place] los medicamentos de venta libre y los recetados solamente como se  lo haya indicado el mdico.  Si le recetaron un antibitico, tmelo como se lo haya indicado el mdico. No deje de tomar los antibiticos aunque comience a Sports administrator.  Beba suficiente lquido para Consulting civil engineer orina clara o de color amarillo plido.  Haga varias comidas pequeas y frecuentes Medical sales representative de comidas abundantes. SOLICITE ATENCIN MDICA SI:  Los sntomas empeoran.  Los sntomas regresan despus del tratamiento. [place] DE INMEDIATO SI:  Vomita sangre de color rojo brillante o una sustancia similar a los granos de caf.  La materia fecal es negra o de color rojo oscuro.  No puede retener los lquidos.  El dolor abdominal empeora.  Tiene fiebre.  No mejora luego de 1 semana. Esta informacin no tiene Marine scientist el consejo del mdico. Asegrese de hacerle al mdico cualquier pregunta que tenga. Document Released: 02/17/2005 Document Revised: 01/29/2015 Document Reviewed: 02/01/2015 Elsevier Interactive Patient Education  [employer].

## 2018-03-16 ENCOUNTER — Encounter: Payer: Self-pay | Admitting: Family Medicine

## 2018-03-16 LAB — CMP14+EGFR
ALBUMIN: 4.4 g/dL (ref 3.5–5.5)
ALT: 15 IU/L (ref 0–32)
AST: 15 IU/L (ref 0–40)
Albumin/Globulin Ratio: 1.6 (ref 1.2–2.2)
Alkaline Phosphatase: 66 IU/L (ref 39–117)
BUN / CREAT RATIO: 16 (ref 9–23)
BUN: 12 mg/dL (ref 6–24)
Bilirubin Total: 0.4 mg/dL (ref 0.0–1.2)
CO2: 21 mmol/L (ref 20–29)
Calcium: 9.2 mg/dL (ref 8.7–10.2)
Chloride: 104 mmol/L (ref 96–106)
Creatinine, Ser: 0.73 mg/dL (ref 0.57–1.00)
GFR calc non Af Amer: 97 mL/min/{1.73_m2} (ref >59)
GFR, EST AFRICAN AMERICAN: 112 mL/min/{1.73_m2} (ref >59)
GLUCOSE: 92 mg/dL (ref 65–99)
Globulin, Total: 2.8 g/dL (ref 1.5–4.5)
Potassium: 3.9 mmol/L (ref 3.5–5.2)
Sodium: 139 mmol/L (ref 134–144)
Total Protein: 7.2 g/dL (ref 6.0–8.5)

## 2018-03-16 LAB — H. PYLORI BREATH TEST: H PYLORI BREATH TEST: NEGATIVE

## 2018-03-16 MED FILL — OMEPRAZOLE DR 40 MG CAPSULE: 40 | 30 days supply | Qty: 30 | Fill #0

## 2018-03-16 MED FILL — hydrOXYzine HCL 25 MG TABS: 25 | 20 days supply | Qty: 60 | Fill #0

## 2018-03-16 MED FILL — AMITIZA 24 MCG CAPSULES: 24 | 30 days supply | Qty: 60 | Fill #0

## 2018-03-16 MED FILL — traMADol HCL 50 MG TABS: 50 | 15 days supply | Qty: 30 | Fill #0

## 2018-03-21 ENCOUNTER — Ambulatory Visit (HOSPITAL_COMMUNITY)
Admission: RE | Admit: 2018-03-21 | Discharge: 2018-03-21 | Disposition: A | Discharge status: Home / Self Care | Payer: Self-pay | Source: Ambulatory Visit | Attending: Family Medicine | Admitting: Family Medicine

## 2018-03-21 DIAGNOSIS — Z90710 Acquired absence of both cervix and uterus: Secondary | ICD-10-CM | POA: Insufficient documentation

## 2018-03-21 DIAGNOSIS — N83201 Unspecified ovarian cyst, right side: Secondary | ICD-10-CM | POA: Insufficient documentation

## 2018-03-21 DIAGNOSIS — R109 Unspecified abdominal pain: Secondary | ICD-10-CM | POA: Insufficient documentation

## 2018-03-21 DIAGNOSIS — N281 Cyst of kidney, acquired: Secondary | ICD-10-CM | POA: Insufficient documentation

## 2018-03-21 MED ORDER — IOHEXOL 300 MG/ML  SOLN
100.0000 mL | Freq: Once | INTRAMUSCULAR | Status: AC | PRN
  Administered 2018-03-21: 100 mL via INTRAVENOUS

## 2018-03-22 ENCOUNTER — Telehealth: Payer: Self-pay

## 2018-03-22 NOTE — Telephone Encounter (Signed)
Patient was called and informed of all lab results and CT scan results.

## 2018-03-22 NOTE — Telephone Encounter (Signed)
-----   Message from Charlott Rakes, MD sent at 03/17/2018  2:31 PM EDT ----- H.pylori breath test is negative

## 2018-04-26 ENCOUNTER — Ambulatory Visit: Payer: Self-pay | Attending: Family Medicine | Admitting: Family Medicine

## 2018-04-26 ENCOUNTER — Encounter: Payer: Self-pay | Admitting: Family Medicine

## 2018-04-26 VITALS — BP 99/64 | HR 90 | Temp 97.3°F | Ht 59.0 in | Wt 116.0 lb

## 2018-04-26 DIAGNOSIS — Z90710 Acquired absence of both cervix and uterus: Secondary | ICD-10-CM | POA: Insufficient documentation

## 2018-04-26 DIAGNOSIS — Z79891 Long term (current) use of opiate analgesic: Secondary | ICD-10-CM | POA: Insufficient documentation

## 2018-04-26 DIAGNOSIS — M62838 Other muscle spasm: Secondary | ICD-10-CM

## 2018-04-26 DIAGNOSIS — Z79899 Other long term (current) drug therapy: Secondary | ICD-10-CM | POA: Insufficient documentation

## 2018-04-26 DIAGNOSIS — R1030 Lower abdominal pain, unspecified: Secondary | ICD-10-CM

## 2018-04-26 DIAGNOSIS — Z853 Personal history of malignant neoplasm of breast: Secondary | ICD-10-CM | POA: Insufficient documentation

## 2018-04-26 DIAGNOSIS — K2900 Acute gastritis without bleeding: Secondary | ICD-10-CM

## 2018-04-26 DIAGNOSIS — R1011 Right upper quadrant pain: Secondary | ICD-10-CM | POA: Insufficient documentation

## 2018-04-26 MED ORDER — OMEPRAZOLE 40 MG PO CPDR: 40.0000 mg | DELAYED_RELEASE_CAPSULE | Freq: Every day | ORAL | 3 refills | Status: DC

## 2018-04-26 MED ORDER — TIZANIDINE HCL 4 MG PO TABS: 4.0000 mg | ORAL_TABLET | Freq: Three times a day (TID) | ORAL | 1 refills | Status: DC | PRN

## 2018-04-26 NOTE — Progress Notes (Signed)
Patient is still having flank pain and lower abdominal pain.

## 2018-04-26 NOTE — Progress Notes (Signed)
Subjective:  Patient ID: Crystal [name], female    DOB: 11-Apr-1971  Age: 47 y.o. MRN: [id number]  CC: Abdominal Pain and Back Pain   HPI Crystal [name]  is a 47 year old woman with a history of GERD, Stage 1B cervical cancer status post robotic-assisted type III radical laparoscopic hysterectomy with bilateral salpingectomy and bilateral sentinel lymph node biopsy in 06/2015 (no residual carcinoma as per GYN ) here for a follow-up visit. At her last visit She complained of right flank pain which radiated anteriorly to her right upper quadrant for about 6 months.  Use of a muscle relaxant tizanidine provided no relief.   Also had complained of epigastric burning ; colonoscopy unrevealing. Amitiza prescribed by GI which she was unable to afford.   CT abdomen and Pelvis from 03/21/18 revealed: IMPRESSION: No acute abnormality.  9 mm right upper pole renal cyst is stable  Right adnexal cyst 2.2 x 2.7 cm.  No free fluid.  GYN contacted about cyst which was thought unlikely to be the etiology of her pain.Today she states flank pain radiates anteriorly to her lower abdomen.  Past Medical History:  Diagnosis Date  . Allergy   . Cancer ([place])    cervical cancer dx.   . History of kidney stones    x1   . Kidney stones    cyst on kidney -no problemsm hx of kidney stones  . 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 PONV (postoperative nausea and vomiting)     Past Surgical History:  Procedure Laterality Date  . BREAST BIOPSY Left 08/28/2015   MRI- High Risk  . BREAST BIOPSY Right 08/28/2015   MRI- Benign  . BREAST EXCISIONAL BIOPSY Left 10/03/2015  . BREAST LUMPECTOMY WITH RADIOACTIVE SEED LOCALIZATION Left 10/03/2015   Procedure: LEFT BREAST LUMPECTOMY WITH RADIOACTIVE SEED LOCALIZATION;  Surgeon: Fanny Skates, MD;  Location: [facility];  Service: General;  Laterality: Left;  . CERVICAL BIOPSY  W/ LOOP  ELECTRODE EXCISION     12'16  . CESAREAN SECTION     2 previous  . ROBOTIC ASSISTED TOTAL HYSTERECTOMY Bilateral 07/01/2015   Procedure: XI ROBOTIC ASSISTED RADICAL TYPE II HYSTERECTOMY, BILATERAL SALPINGECTOMY, SENTINAL LYMPH NODE BIOPSY;  Surgeon: Everitt Amber, MD;  Location: [facility];  Service: Gynecology;  Laterality: Bilateral;    No Known Allergies   Outpatient Medications Prior to Visit  Medication Sig Dispense Refill  . alum & mag hydroxide-simeth (MAALOX/MYLANTA) 200-200-20 MG/5ML suspension Take by mouth every 6 (six) hours as needed for indigestion or heartburn.    . bisacodyl (DULCOLAX) 5 MG EC tablet Take 5 mg by mouth daily as needed for moderate constipation.    . hydrOXYzine (ATARAX/VISTARIL) 25 MG tablet Take 1 tablet (25 mg total) by mouth 3 (three) times daily as needed. (Patient not taking: Reported on 04/26/2018) 60 tablet 1  . lubiprostone (AMITIZA) 24 MCG capsule Take 1 capsule (24 mcg total) by mouth 2 (two) times daily with a meal. (Patient not taking: Reported on 04/26/2018) 60 capsule 3  . Naproxen Sodium (FLANAX PAIN RELIEF PO) Take 220 mg by mouth as needed.    . traMADol (ULTRAM) 50 MG tablet Take 1 tablet (50 mg total) by mouth every 12 (twelve) hours as needed. (Patient not taking: Reported on 04/26/2018) 30 tablet 0  . omeprazole (PRILOSEC) 40 MG capsule Take 1 capsule (40 mg total) by mouth daily. (Patient not taking: Reported on 04/26/2018) 30 capsule  3  . tiZANidine (ZANAFLEX) 4 MG tablet Take 1 tablet (4 mg total) by mouth every 8 (eight) hours as needed for muscle spasms. (Patient not taking: Reported on 04/26/2018) 90 tablet 1   Facility-Administered Medications Prior to Visit  Medication Dose Route Frequency Provider Last Rate Last Dose  . 0.9 %  sodium chloride infusion  500 mL Intravenous Once Nandigam, Kavitha V, MD      . 0.9 %  sodium chloride infusion  500 mL Intravenous Once Nandigam, Kavitha V, MD        ROS Review of Systems  Constitutional:  Negative for activity change, appetite change and fatigue.  HENT: Negative for congestion, sinus pressure and sore throat.   Eyes: Negative for visual disturbance.  Respiratory: Negative for cough, chest tightness, shortness of breath and wheezing.   Cardiovascular: Negative for chest pain and palpitations.  Gastrointestinal: Positive for abdominal pain. Negative for abdominal distention and constipation.  Endocrine: Negative for polydipsia.  Genitourinary: Negative for dysuria and frequency.  Musculoskeletal: Positive for back pain. Negative for arthralgias.  Skin: Negative for rash.  Neurological: Negative for tremors, light-headedness and numbness.  Hematological: Does not bruise/bleed easily.  Psychiatric/Behavioral: Negative for agitation and behavioral problems.    Objective:  BP 99/64   Pulse 90   Temp (!) 97.3 F (36.3 C) (Oral)   Ht 4\' 11"  (1.499 m)   Wt 116 lb (52.6 kg)   LMP 06/07/2015 (Exact Date)   SpO2 99%   BMI 23.43 kg/m   BP/Weight 04/26/2018 03/15/2018 82/70/7867  Systolic BP 99 544 96  Diastolic BP 64 69 67  Wt. (Lbs) 116 115 114.3  BMI 23.43 23.23 23.09      Physical Exam  Constitutional: She is oriented to person, place, and time. She appears well-developed and well-nourished.  Cardiovascular: Normal rate, normal heart sounds and intact distal pulses.  No murmur heard. Pulmonary/Chest: Effort normal and breath sounds normal. She has no wheezes. She has no rales. She exhibits no tenderness.  Abdominal: Soft. Bowel sounds are normal. She exhibits no distension and no mass. There is tenderness (epigastric).  Musculoskeletal: Normal range of motion.  Negative CVA tenderness; negative straight leg raise  Neurological: She is alert and oriented to person, place, and time.  Psychiatric: She has a normal mood and affect.     Assessment & Plan:   1. Muscle spasm Uncontrolled CT abdomen and pelvis unrevealing - Ambulatory referral to Physical Therapy -  tiZANidine (ZANAFLEX) 4 MG tablet; Take 1 tablet (4 mg total) by mouth every 8 (eight) hours as needed for muscle spasms.  Dispense: 90 tablet; Refill: 1  2. Other acute gastritis without hemorrhage Uncontrolled Has been off her PPI - omeprazole (PRILOSEC) 40 MG capsule; Take 1 capsule (40 mg total) by mouth daily.  Dispense: 30 capsule; Refill: 3  3. Lower abdominal pain Prevention of constipation Increase fiber intake   Meds ordered this encounter  Medications  . tiZANidine (ZANAFLEX) 4 MG tablet    Sig: Take 1 tablet (4 mg total) by mouth every 8 (eight) hours as needed for muscle spasms.    Dispense:  90 tablet    Refill:  1    Discontinue methocarbamol  . omeprazole (PRILOSEC) 40 MG capsule    Sig: Take 1 capsule (40 mg total) by mouth daily.    Dispense:  30 capsule    Refill:  3    Follow-up: Return in about 3 months (around 07/26/2018) for Follow-up of chronic medical conditions.  Charlott Rakes MD

## 2018-04-27 MED FILL — tiZANidine HCL 4 MG TABS: 4 | 30 days supply | Qty: 90 | Fill #0

## 2018-05-22 ENCOUNTER — Encounter: Payer: Self-pay | Admitting: Physical Therapy

## 2018-05-22 ENCOUNTER — Other Ambulatory Visit: Payer: Self-pay

## 2018-05-22 ENCOUNTER — Ambulatory Visit: Payer: Self-pay | Attending: Family Medicine | Admitting: Physical Therapy

## 2018-05-22 DIAGNOSIS — M542 Cervicalgia: Secondary | ICD-10-CM

## 2018-05-22 DIAGNOSIS — M545 Low back pain, unspecified: Secondary | ICD-10-CM

## 2018-05-22 DIAGNOSIS — M62830 Muscle spasm of back: Secondary | ICD-10-CM
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
## 2018-05-25 ENCOUNTER — Ambulatory Visit: Payer: Self-pay | Attending: Family Medicine | Admitting: Physical Therapy

## 2018-05-25 DIAGNOSIS — M545 Low back pain: Secondary | ICD-10-CM | POA: Insufficient documentation

## 2018-05-25 DIAGNOSIS — M542 Cervicalgia: Secondary | ICD-10-CM | POA: Insufficient documentation

## 2018-05-25 DIAGNOSIS — M62830 Muscle spasm of back: Secondary | ICD-10-CM | POA: Insufficient documentation

## 2018-05-25 MED FILL — $AMITIZA 24 MCG CAPSULES: 24 | 90 days supply | Qty: 180 | Fill #1

## 2018-05-25 NOTE — Therapy (Signed)
[place], [place], [address] Phone: [phone]   Fax:  [phone]  Physical Therapy Treatment  Patient Details  Name: Crystal [name] MRN: [id number] Date of Birth: 02-28-1971 Referring Provider (PT): Newlin   Encounter Date: 05/25/2018  PT End of Session - 05/25/18 1221    Visit Number  2    Authorization Type  has CAFA needs to reapply due to it expiring 05/28/18    PT Start Time  1140    PT Stop Time  1218    PT Time Calculation (min)  38 min       Past Medical History:  Diagnosis Date  . Allergy   . Cancer ([place])    cervical cancer dx.   . History of kidney stones    x1   . Kidney stones    cyst on kidney -no problemsm hx of kidney stones  . 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 PONV (postoperative nausea and vomiting)     Past Surgical History:  Procedure Laterality Date  . BREAST BIOPSY Left 08/28/2015   MRI- High Risk  . BREAST BIOPSY Right 08/28/2015   MRI- Benign  . BREAST EXCISIONAL BIOPSY Left 10/03/2015  . BREAST LUMPECTOMY WITH RADIOACTIVE SEED LOCALIZATION Left 10/03/2015   Procedure: LEFT BREAST LUMPECTOMY WITH RADIOACTIVE SEED LOCALIZATION;  Surgeon: Fanny Skates, MD;  Location: [facility];  Service: General;  Laterality: Left;  . CERVICAL BIOPSY  W/ LOOP ELECTRODE EXCISION     12'16  . CESAREAN SECTION     2 previous  . ROBOTIC ASSISTED TOTAL HYSTERECTOMY Bilateral 07/01/2015   Procedure: XI ROBOTIC ASSISTED RADICAL TYPE II HYSTERECTOMY, BILATERAL SALPINGECTOMY, SENTINAL LYMPH NODE BIOPSY;  Surgeon: Everitt Amber, MD;  [facility];  Service: Gynecology;  Laterality: Bilateral;    There were no vitals filed for this visit.  Subjective Assessment - 05/25/18 1144    Subjective  doing ex at home, more pain in RT side of neck ( but entire back hurts too)    Patient is accompained by:  Interpreter    Currently in Pain?  Yes    Pain Score  5                        [facility] Adult PT Treatment/Exercise - 05/25/18 0001      Exercises   Exercises  Neck;Shoulder      Neck Exercises: Machines for Strengthening   Nustep  L 3 5 min      Neck Exercises: Standing   Neck Retraction  10 reps;3 secs   head on ball on wall   Wall Push Ups  10 reps    Other Standing Exercises  scap stab 4 way yellow tband 10 each      Manual Therapy   Manual Therapy  Soft tissue mobilization;Taping    Manual therapy comments  SNAGS and NAGS with cerv ROM    Soft tissue mobilization  UT/rhom and SCM    Kinesiotex  Create Space   I strip trap/rhom/SCM            PT Education - 05/25/18 1221    Education Details  trap wear and precautions,educ on DN for next session    Person(s) Educated  Patient;Other (comment)    Methods  Explanation    Comprehension  Verbalized understanding  PT Short Term Goals - 05/22/18 1718      PT SHORT TERM GOAL #1   Title  independent with initial HEP    Time  8    Period  Weeks    Status  New        PT Long Term Goals - 05/22/18 1718      PT LONG TERM GOAL #1   Title  understand posture and body mechanics    Time  8    Period  Weeks    Status  New      PT LONG TERM GOAL #2   Title  decrease pain 50%    Time  8    Period  Weeks    Status  New      PT LONG TERM GOAL #3   Title  increase ROM 25%    Time  8    Period  Weeks    Status  New      PT LONG TERM GOAL #4   Title  increase strength to 4/5 without increase of pain    Time  8    Period  Weeks    Status  New            Plan - 05/25/18 1221    Clinical Impression Statement  pt needed cuing with all ex for all ex for posture and to not activate traps. pt very tender with STW and large TP noted. pt respinded wiell to SNAGS and NAGS to increase cerv ROM.     PT Treatment/Interventions  ADLs/Self Care Home Management;Cryotherapy;Electrical Stimulation;Moist  Heat;Ultrasound;Traction;Therapeutic activities;Therapeutic exercise;Patient/family education;Neuromuscular re-education;Manual techniques;Passive range of motion;Dry needling    PT Next Visit Plan  pt to get CAFA reapllied 05/29/17. multi c/o pain but neck worse so focus there assess and add DN. Did not try MH and estim as she felt it made worse when done at chiropractor       Patient will benefit from skilled therapeutic intervention in order to improve the following deficits and impairments:  Improper body mechanics, Pain, Postural dysfunction, Increased muscle spasms, Decreased range of motion, Decreased strength, Impaired UE functional use  Visit Diagnosis: Cervicalgia     Problem List Patient Active Problem List   Diagnosis Date Noted  . Chest pain 07/28/2017  . Hemoptysis 03/04/2017  . Gastritis 12/06/2016  . Herpes zoster without complication 31/51/7616  . Depression 04/07/2016  . 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 Urinary retention with incomplete bladder emptying 07/23/2015  . Post-operative nausea and vomiting 07/08/2015  . Cervical cancer ([place]) 07/01/2015  . Malignant neoplasm of cervix ([place]) 06/02/2015  . Squamous cell carcinoma of cervix ([place]) 04/14/2015  . Renal mass, right 04/14/2015  . Periodic health assessment, general screening, adult 02/23/2013    Rosanne Wohlfarth,ANGIE  PTA 05/25/2018, 12:24 PM  [place], [place], [address] Phone: [phone]   Fax:  [phone]  Name: Crystal [name] MRN: [id number] Date of Birth: 1971-02-23

## 2018-06-01 ENCOUNTER — Encounter: Payer: Self-pay | Admitting: Physical Therapy

## 2018-06-05 ENCOUNTER — Ambulatory Visit: Payer: Self-pay

## 2018-06-08 ENCOUNTER — Ambulatory Visit: Payer: Self-pay | Admitting: Physical Therapy

## 2018-06-15 ENCOUNTER — Ambulatory Visit: Payer: Self-pay | Admitting: Physical Therapy

## 2018-06-20 ENCOUNTER — Ambulatory Visit: Payer: Self-pay | Admitting: Physical Therapy

## 2018-06-20 ENCOUNTER — Encounter: Payer: Self-pay | Admitting: Physical Therapy

## 2018-06-20 DIAGNOSIS — M545 Low back pain, unspecified: Secondary | ICD-10-CM

## 2018-06-20 DIAGNOSIS — M62830 Muscle spasm of back: Secondary | ICD-10-CM

## 2018-06-20 DIAGNOSIS — M542 Cervicalgia: Secondary | ICD-10-CM

## 2018-06-20 NOTE — Therapy (Signed)
[place], [place], [address] Phone: [phone]   Fax:  [phone]  Physical Therapy Treatment  Patient Details  Name: Crystal [name] MRN: [id number] Date of Birth: February 16, 1971 Referring Provider (PT): Newlin   Encounter Date: 06/20/2018  PT End of Session - 06/20/18 1012    Visit Number  3    Authorization Type  has CAFA  expires 12/05/18    PT Start Time  0930    PT Stop Time  1013    PT Time Calculation (min)  43 min    Activity Tolerance  Patient tolerated treatment well    Behavior During Therapy  [facility] for tasks assessed/performed       Past Medical History:  Diagnosis Date  . Allergy   . Cancer ([place])    cervical cancer dx.   . History of kidney stones    x1   . Kidney stones    cyst on kidney -no problemsm hx of kidney stones  . 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 PONV (postoperative nausea and vomiting)     Past Surgical History:  Procedure Laterality Date  . BREAST BIOPSY Left 08/28/2015   MRI- High Risk  . BREAST BIOPSY Right 08/28/2015   MRI- Benign  . BREAST EXCISIONAL BIOPSY Left 10/03/2015  . BREAST LUMPECTOMY WITH RADIOACTIVE SEED LOCALIZATION Left 10/03/2015   Procedure: LEFT BREAST LUMPECTOMY WITH RADIOACTIVE SEED LOCALIZATION;  Surgeon: Fanny Skates, MD;  Location: [place];  Service: General;  Laterality: Left;  . CERVICAL BIOPSY  W/ LOOP ELECTRODE EXCISION     12'16  . CESAREAN SECTION     2 previous  . ROBOTIC ASSISTED TOTAL HYSTERECTOMY Bilateral 07/01/2015   Procedure: XI ROBOTIC ASSISTED RADICAL TYPE II HYSTERECTOMY, BILATERAL SALPINGECTOMY, SENTINAL LYMPH NODE BIOPSY;  Surgeon: Everitt Amber, MD;  [facility];  Service: Gynecology;  Laterality: Bilateral;    There were no vitals filed for this visit.  Subjective Assessment - 06/20/18 0928    Subjective  Patient reports that she is  doing a little better but not a lot.  PAin is mostly in the right neck area    Currently in Pain?  Yes    Pain Score  4     Pain Location  Neck    Pain Orientation  Right    Pain Descriptors / Indicators  Aching;Spasm    Aggravating Factors   activity                       [facility] Adult PT Treatment/Exercise - 06/20/18 0001      Self-Care   Self-Care  ADL's    ADL's  went over ways to decrease stress on the neck and shoulder while vacuuming and with other cleaning chores      Neck Exercises: Machines for Strengthening   Nustep  L 4 6 min      Neck Exercises: Theraband   Shoulder External Rotation  20 reps;Red      Neck Exercises: Standing   Neck Retraction  3 secs;15 reps    Other Standing Exercises  scap stab 4 way yellow tband 10 each    Other Standing Exercises  3# shrugs with upper trap and levator stretches      Manual Therapy   Manual Therapy  Soft tissue mobilization    Soft tissue mobilization  UT/rhom and SCM  Kinesiotex  Create Space       Trigger Point Dry Needling - 06/20/18 1009    Consent Given?  Yes    Education Handout Provided  Yes    Muscles Treated Upper Body  Upper trapezius;Scalenes    Scalenes Response  Palpable increased muscle length    Upper Trapezius Response  Twitch reponse elicited             PT Short Term Goals - 05/22/18 1718      PT SHORT TERM GOAL #1   Title  independent with initial HEP    Time  8    Period  Weeks    Status  New        PT Long Term Goals - 06/20/18 1015      PT LONG TERM GOAL #1   Title  understand posture and body mechanics    Status  On-going      PT LONG TERM GOAL #2   Title  decrease pain 50%    Status  On-going      PT LONG TERM GOAL #3   Title  increase ROM 25%    Status  On-going      PT LONG TERM GOAL #4   Title  increase strength to 4/5 without increase of pain    Status  On-going            Plan - 06/20/18 1013    Clinical Impression Statement  Patietn with  a lot of spasms and trigger points in the upper trap, the levator, scalenes and SCM on the right.  She reported understanding the education about decreased stress with changing body mechanics, she ahd a lot of LTR's with dry needling    PT Next Visit Plan  work on the strength of scapular stability and the spasm    Consulted and Agree with Plan of Care  Patient       Patient will benefit from skilled therapeutic intervention in order to improve the following deficits and impairments:  Improper body mechanics, Pain, Postural dysfunction, Increased muscle spasms, Decreased range of motion, Decreased strength, Impaired UE functional use  Visit Diagnosis: Cervicalgia  Acute bilateral low back pain without sciatica  Muscle spasm of back     Problem List Patient Active Problem List   Diagnosis Date Noted  . Chest pain 07/28/2017  . Hemoptysis 03/04/2017  . Gastritis 12/06/2016  . Herpes zoster without complication 59/16/3846  . Depression 04/07/2016  . 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 Urinary retention with incomplete bladder emptying 07/23/2015  . Post-operative nausea and vomiting 07/08/2015  . Cervical cancer ([place]) 07/01/2015  . Malignant neoplasm of cervix ([place]) 06/02/2015  . Squamous cell carcinoma of cervix ([place]) 04/14/2015  . Renal mass, right 04/14/2015  . Periodic health assessment, general screening, adult 02/23/2013    Sumner Boast., PT 06/20/2018, 10:16 AM  [place], [place], [address] Phone: [phone]   Fax:  [phone]  Name: Alyce Inscore [name] MRN: [id number] Date of Birth: 1970/10/23

## 2018-06-20 NOTE — Patient Instructions (Signed)

## 2018-06-27 ENCOUNTER — Encounter: Payer: Self-pay | Admitting: Physical Therapy

## 2018-06-27 ENCOUNTER — Ambulatory Visit: Payer: Self-pay | Attending: Family Medicine | Admitting: Physical Therapy

## 2018-06-27 DIAGNOSIS — M545 Low back pain, unspecified: Secondary | ICD-10-CM

## 2018-06-27 DIAGNOSIS — M542 Cervicalgia: Secondary | ICD-10-CM | POA: Insufficient documentation

## 2018-06-27 DIAGNOSIS — M62830 Muscle spasm of back: Secondary | ICD-10-CM | POA: Insufficient documentation

## 2018-06-27 NOTE — Therapy (Signed)
[place], [place], [address] Phone: [phone]   Fax:  [phone]  Physical Therapy Treatment  Patient Details  Name: Crystal [name] MRN: [id number] Date of Birth: 09-29-1970 Referring Provider (PT): Newlin   Encounter Date: 06/27/2018  PT End of Session - 06/27/18 1643    Visit Number  4    Authorization Type  has CAFA  expires 12/05/18    PT Start Time  7494    PT Stop Time  1645    PT Time Calculation (min)  37 min    Activity Tolerance  Patient tolerated treatment well    Behavior During Therapy  [facility] for tasks assessed/performed       Past Medical History:  Diagnosis Date  . Allergy   . Cancer ([place])    cervical cancer dx.   . History of kidney stones    x1   . Kidney stones    cyst on kidney -no problemsm hx of kidney stones  . 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 PONV (postoperative nausea and vomiting)     Past Surgical History:  Procedure Laterality Date  . BREAST BIOPSY Left 08/28/2015   MRI- High Risk  . BREAST BIOPSY Right 08/28/2015   MRI- Benign  . BREAST EXCISIONAL BIOPSY Left 10/03/2015  . BREAST LUMPECTOMY WITH RADIOACTIVE SEED LOCALIZATION Left 10/03/2015   Procedure: LEFT BREAST LUMPECTOMY WITH RADIOACTIVE SEED LOCALIZATION;  Surgeon: Fanny Skates, MD;  Location: [place];  Service: General;  Laterality: Left;  . CERVICAL BIOPSY  W/ LOOP ELECTRODE EXCISION     12'16  . CESAREAN SECTION     2 previous  . ROBOTIC ASSISTED TOTAL HYSTERECTOMY Bilateral 07/01/2015   Procedure: XI ROBOTIC ASSISTED RADICAL TYPE II HYSTERECTOMY, BILATERAL SALPINGECTOMY, SENTINAL LYMPH NODE BIOPSY;  Surgeon: Everitt Amber, MD;  [facility];  Service: Gynecology;  Laterality: Bilateral;    There were no vitals filed for this visit.  Subjective Assessment - 06/27/18 1610    Subjective  Pt reports that she is doing  better overall.     Currently in Pain?  Yes    Pain Score  3     Pain Location  Neck    Pain Orientation  Right                       [facility] Adult PT Treatment/Exercise - 06/27/18 0001      Neck Exercises: Machines for Strengthening   Nustep  L 4 6 min      Neck Exercises: Standing   Other Standing Exercises  Rows and ext red tband 2x15     Other Standing Exercises  3# shrugs with upper trap and levator stretches      Shoulder Exercises: Standing   Other Standing Exercises  ER yellow 2x10       Manual Therapy   Manual Therapy  Soft tissue mobilization;Passive ROM    Manual therapy comments  Some PROM taken to end range and held    Soft tissue mobilization  UT/rhom and SCM    Passive ROM  Cervical spine all directions               PT Short Term Goals - 05/22/18 1718      PT SHORT TERM GOAL #1   Title  independent with initial HEP    Time  8  Period  Weeks    Status  New        PT Long Term Goals - 06/20/18 1015      PT LONG TERM GOAL #1   Title  understand posture and body mechanics    Status  On-going      PT LONG TERM GOAL #2   Title  decrease pain 50%    Status  On-going      PT LONG TERM GOAL #3   Title  increase ROM 25%    Status  On-going      PT LONG TERM GOAL #4   Title  increase strength to 4/5 without increase of pain    Status  On-going            Plan - 06/27/18 1643    Clinical Impression Statement  Patient ~ 8 minutes late for today's treatment session. She reports improvement overall with decrease a pain. She did well with rows and extensions using increase resistance. Positive response to [place]. Pt reports some residue soreness form DN.     Rehab Potential  Good    Clinical Impairments Affecting Rehab Potential  CAFA insurance that will need to be renewed    PT Frequency  2x / week    PT Treatment/Interventions  ADLs/Self Care Home Management;Cryotherapy;Electrical Stimulation;Moist  Heat;Ultrasound;Traction;Therapeutic activities;Therapeutic exercise;Patient/family education;Neuromuscular re-education;Manual techniques;Passive range of motion;Dry needling    PT Next Visit Plan  work on the strength of scapular stability and the spasm       Patient will benefit from skilled therapeutic intervention in order to improve the following deficits and impairments:  Improper body mechanics, Pain, Postural dysfunction, Increased muscle spasms, Decreased range of motion, Decreased strength, Impaired UE functional use  Visit Diagnosis: Cervicalgia  Acute bilateral low back pain without sciatica  Muscle spasm of back     Problem List Patient Active Problem List   Diagnosis Date Noted  . Chest pain 07/28/2017  . Hemoptysis 03/04/2017  . Gastritis 12/06/2016  . Herpes zoster without complication 68/03/5725  . Depression 04/07/2016  . 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 Urinary retention with incomplete bladder emptying 07/23/2015  . Post-operative nausea and vomiting 07/08/2015  . Cervical cancer ([place]) 07/01/2015  . Malignant neoplasm of cervix ([place]) 06/02/2015  . Squamous cell carcinoma of cervix ([place]) 04/14/2015  . Renal mass, right 04/14/2015  . Periodic health assessment, general screening, adult 02/23/2013    Scot Jun, PTA 06/27/2018, 4:48 PM  [place], [place], [address] Phone: [phone]   Fax:  [phone]  Name: Shanoah Asbill [name] MRN: [id number] Date of Birth: 1970/07/29

## 2018-06-29 ENCOUNTER — Ambulatory Visit: Payer: Self-pay | Admitting: Physical Therapy

## 2018-06-29 MED FILL — CHLORHEXIDINE 0.12% RINSE: 0.12 | 25 days supply | Qty: 473 | Fill #0

## 2018-06-29 MED FILL — CLINDAMYCIN HCL 300 MG CAP: 300 | 14 days supply | Qty: 42 | Fill #0

## 2018-06-29 MED FILL — IBUPROFEN 800 MG TABLET: 800 | 10 days supply | Qty: 30 | Fill #0

## 2018-07-06 ENCOUNTER — Encounter: Payer: Self-pay | Admitting: Physical Therapy

## 2018-07-06 ENCOUNTER — Ambulatory Visit: Payer: Self-pay | Admitting: Physical Therapy

## 2018-07-06 DIAGNOSIS — M545 Low back pain, unspecified: Secondary | ICD-10-CM

## 2018-07-06 DIAGNOSIS — M62830 Muscle spasm of back: Secondary | ICD-10-CM

## 2018-07-06 DIAGNOSIS — M542 Cervicalgia: Secondary | ICD-10-CM

## 2018-07-06 NOTE — Therapy (Signed)
[place], [place], [address] Phone: [phone]   Fax:  [phone]  Physical Therapy Treatment  Patient Details  Name: Crystal [name] MRN: [id number] Date of Birth: 06/26/1970 Referring Provider (PT): Newlin   Encounter Date: 07/06/2018  PT End of Session - 07/06/18 1550    Visit Number  5    Authorization Type  has CAFA  expires 12/05/18    PT Start Time  6962    PT Stop Time  1551    PT Time Calculation (min)  36 min    Activity Tolerance  Patient tolerated treatment well    Behavior During Therapy  [facility] for tasks assessed/performed       Past Medical History:  Diagnosis Date  . Allergy   . Cancer ([place])    cervical cancer dx.   . History of kidney stones    x1   . Kidney stones    cyst on kidney -no problemsm hx of kidney stones  . 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 PONV (postoperative nausea and vomiting)     Past Surgical History:  Procedure Laterality Date  . BREAST BIOPSY Left 08/28/2015   MRI- High Risk  . BREAST BIOPSY Right 08/28/2015   MRI- Benign  . BREAST EXCISIONAL BIOPSY Left 10/03/2015  . BREAST LUMPECTOMY WITH RADIOACTIVE SEED LOCALIZATION Left 10/03/2015   Procedure: LEFT BREAST LUMPECTOMY WITH RADIOACTIVE SEED LOCALIZATION;  Surgeon: Fanny Skates, MD;  Location: [place];  Service: General;  Laterality: Left;  . CERVICAL BIOPSY  W/ LOOP ELECTRODE EXCISION     12'16  . CESAREAN SECTION     2 previous  . ROBOTIC ASSISTED TOTAL HYSTERECTOMY Bilateral 07/01/2015   Procedure: XI ROBOTIC ASSISTED RADICAL TYPE II HYSTERECTOMY, BILATERAL SALPINGECTOMY, SENTINAL LYMPH NODE BIOPSY;  Surgeon: Everitt Amber, MD;  [facility];  Service: Gynecology;  Laterality: Bilateral;    There were no vitals filed for this visit.  Subjective Assessment - 07/06/18 1517    Subjective  Patient reports that she is  doing well.    Currently in Pain?  No/denies    Pain Score  0-No pain                       [facility] Adult PT Treatment/Exercise - 07/06/18 0001      Neck Exercises: Machines for Strengthening   UBE (Upper Arm Bike)  L1 3 min each way    Other Machines for Strengthening  Rows and lats 20lb 2x10       Neck Exercises: Theraband   Shoulder External Rotation  20 reps   yellow    Horizontal ADduction  20 reps;Red      Manual Therapy   Manual Therapy  Soft tissue mobilization;Passive ROM    Manual therapy comments  Some PROM taken to end range and held    Soft tissue mobilization  UT/rhom and SCM    Passive ROM  Cervical spine all directions               PT Short Term Goals - 05/22/18 1718      PT SHORT TERM GOAL #1   Title  independent with initial HEP    Time  8    Period  Weeks    Status  New        PT Long Term Goals - 07/06/18 1553  PT LONG TERM GOAL #1   Title  understand posture and body mechanics    Status  Partially Met      PT LONG TERM GOAL #2   Title  decrease pain 50%    Status  Achieved      PT LONG TERM GOAL #3   Title  increase ROM 25%    Status  Partially Met            Plan - 07/06/18 1554    Clinical Impression Statement  Pt enters clinic reporting improvement overall.  She said this has been the first time she has been pain free in over an year. Progressed to seated rows and lats with good strength and ROM. Tactile cues to keep elbows to her side with external rotation. positive response to MT with decrease stiffness.  She does report that she has some pain at work from [place] [employer].    Rehab Potential  Good    PT Frequency  2x / week    PT Duration  8 weeks    PT Next Visit Plan  work on the strength of scapular stability and the spasm       Patient will benefit from skilled therapeutic intervention in order to improve the following deficits and impairments:  Improper body mechanics, Pain, Postural  dysfunction, Increased muscle spasms, Decreased range of motion, Decreased strength, Impaired UE functional use  Visit Diagnosis: Cervicalgia  Acute bilateral low back pain without sciatica  Muscle spasm of back     Problem List Patient Active Problem List   Diagnosis Date Noted  . Chest pain 07/28/2017  . Hemoptysis 03/04/2017  . Gastritis 12/06/2016  . Herpes zoster without complication 24/81/8590  . Depression 04/07/2016  . 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 Urinary retention with incomplete bladder emptying 07/23/2015  . Post-operative nausea and vomiting 07/08/2015  . Cervical cancer ([place]) 07/01/2015  . Malignant neoplasm of cervix ([place]) 06/02/2015  . Squamous cell carcinoma of cervix ([place]) 04/14/2015  . Renal mass, right 04/14/2015  . Periodic health assessment, general screening, adult 02/23/2013    Scot Jun, PTA 07/06/2018, 3:58 PM  [place], [place], [address] Phone: [phone]   Fax:  [phone]  Name: Crystal [name] MRN: [id number] Date of Birth: Jul 02, 1970

## 2018-07-12 ENCOUNTER — Encounter: Payer: Self-pay | Admitting: Physical Therapy

## 2018-07-12 ENCOUNTER — Ambulatory Visit: Payer: Self-pay | Admitting: Physical Therapy

## 2018-07-12 DIAGNOSIS — M545 Low back pain, unspecified: Secondary | ICD-10-CM

## 2018-07-12 DIAGNOSIS — M62830 Muscle spasm of back: Secondary | ICD-10-CM

## 2018-07-12 DIAGNOSIS — M542 Cervicalgia: Secondary | ICD-10-CM

## 2018-07-12 NOTE — Therapy (Signed)
[place], [place], [address] Phone: [phone]   Fax:  [phone]  Physical Therapy Treatment  Patient Details  Name: Crystal [name] MRN: [id number] Date of Birth: January 11, 1971 Referring Provider (PT): Newlin   Encounter Date: 07/12/2018  PT End of Session - 07/12/18 0920    Visit Number  6    Authorization Type  has CAFA  expires 12/05/18    PT Start Time  0845    PT Stop Time  0932    PT Time Calculation (min)  47 min    Activity Tolerance  Patient tolerated treatment well    Behavior During Therapy  [facility] for tasks assessed/performed       Past Medical History:  Diagnosis Date  . Allergy   . Cancer ([place])    cervical cancer dx.   . History of kidney stones    x1   . Kidney stones    cyst on kidney -no problemsm hx of kidney stones  . 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 PONV (postoperative nausea and vomiting)     Past Surgical History:  Procedure Laterality Date  . BREAST BIOPSY Left 08/28/2015   MRI- High Risk  . BREAST BIOPSY Right 08/28/2015   MRI- Benign  . BREAST EXCISIONAL BIOPSY Left 10/03/2015  . BREAST LUMPECTOMY WITH RADIOACTIVE SEED LOCALIZATION Left 10/03/2015   Procedure: LEFT BREAST LUMPECTOMY WITH RADIOACTIVE SEED LOCALIZATION;  Surgeon: Fanny Skates, MD;  Location: [place];  Service: General;  Laterality: Left;  . CERVICAL BIOPSY  W/ LOOP ELECTRODE EXCISION     12'16  . CESAREAN SECTION     2 previous  . ROBOTIC ASSISTED TOTAL HYSTERECTOMY Bilateral 07/01/2015   Procedure: XI ROBOTIC ASSISTED RADICAL TYPE II HYSTERECTOMY, BILATERAL SALPINGECTOMY, SENTINAL LYMPH NODE BIOPSY;  Surgeon: Everitt Amber, MD;  [facility];  Service: Gynecology;  Laterality: Bilateral;    There were no vitals filed for this visit.  Subjective Assessment - 07/12/18 0848    Subjective  Patient reports a little pain in  her R shoulder     Currently in Pain?  Yes    Pain Score  4     Pain Location  Shoulder    Pain Orientation  Right                       [facility] Adult PT Treatment/Exercise - 07/12/18 0001      Neck Exercises: Machines for Strengthening   UBE (Upper Arm Bike)  L3.5 3 min each way    Other Machines for Strengthening  Rows and lats 20lb 2x10       Neck Exercises: Standing   Other Standing Exercises  Rows and ext green tband 2x15     Other Standing Exercises  5# shrugs 2x10      Shoulder Exercises: Standing   External Rotation  Theraband;20 reps    Theraband Level (Shoulder External Rotation)  Level 2 (Red)    Flexion  Weights;20 reps;Both    Shoulder Flexion Weight (lbs)  2    ABduction  Weights;20 reps;Both    Shoulder ABduction Weight (lbs)  2      Modalities   Modalities  Electrical Stimulation;Moist Heat      Moist Heat Therapy   Number Minutes Moist Heat  15 Minutes    Moist Heat Location  Shoulder  Acupuncturist Location  Posterior R shoulder    Electrical Stimulation Action  IFC    Electrical Stimulation Parameters  Supinr to pt tolerance    Electrical Stimulation Goals  Pain               PT Short Term Goals - 05/22/18 1718      PT SHORT TERM GOAL #1   Title  independent with initial HEP    Time  8    Period  Weeks    Status  New        PT Long Term Goals - 07/12/18 3149      PT LONG TERM GOAL #1   Title  understand posture and body mechanics    Status  Partially Met      PT LONG TERM GOAL #2   Title  decrease pain 50%    Status  Achieved      PT LONG TERM GOAL #3   Title  increase ROM 25%    Status  Partially Met            Plan - 07/12/18 0921    Clinical Impression Statement  Pt stated that's he is having a little pain in her R shoulder near the scapula. She tolerated all of today's interventions well.  Pt was able to increase resistance with most of the interventions. E-stim  and MHP to help with pain.    Rehab Potential  Good    PT Frequency  2x / week    PT Duration  8 weeks    PT Treatment/Interventions  ADLs/Self Care Home Management;Cryotherapy;Electrical Stimulation;Moist Heat;Ultrasound;Traction;Therapeutic activities;Therapeutic exercise;Patient/family education;Neuromuscular re-education;Manual techniques;Passive range of motion;Dry needling    PT Next Visit Plan  work on the strength of scapular stability and the spasm       Patient will benefit from skilled therapeutic intervention in order to improve the following deficits and impairments:  Improper body mechanics, Pain, Postural dysfunction, Increased muscle spasms, Decreased range of motion, Decreased strength, Impaired UE functional use  Visit Diagnosis: Cervicalgia  Acute bilateral low back pain without sciatica  Muscle spasm of back     Problem List Patient Active Problem List   Diagnosis Date Noted  . Chest pain 07/28/2017  . Hemoptysis 03/04/2017  . Gastritis 12/06/2016  . Herpes zoster without complication 70/26/3785  . Depression 04/07/2016  . 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 Urinary retention with incomplete bladder emptying 07/23/2015  . Post-operative nausea and vomiting 07/08/2015  . Cervical cancer ([place]) 07/01/2015  . Malignant neoplasm of cervix ([place]) 06/02/2015  . 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 Renal mass, right 04/14/2015  . Periodic health assessment, general screening, adult 02/23/2013    Scot Jun, PTA 07/12/2018, 9:23 AM  [place], [place], [address] Phone: [phone]   Fax:  [phone]  Name: Crystal [name] MRN: [id number] Date of Birth: April 01, 1971

## 2018-07-13 ENCOUNTER — Encounter: Payer: Self-pay | Admitting: Physical Therapy

## 2018-07-18 ENCOUNTER — Encounter: Payer: Self-pay | Admitting: Physical Therapy

## 2018-07-18 ENCOUNTER — Ambulatory Visit: Payer: Self-pay | Admitting: Physical Therapy

## 2018-07-18 DIAGNOSIS — M62830 Muscle spasm of back: Secondary | ICD-10-CM

## 2018-07-18 DIAGNOSIS — M545 Low back pain, unspecified: Secondary | ICD-10-CM

## 2018-07-18 DIAGNOSIS — M542 Cervicalgia: Secondary | ICD-10-CM

## 2018-07-18 NOTE — Therapy (Signed)
Crystal [name], [place], [address] Phone: [phone]   Fax:  [phone]  Physical Therapy Treatment  Patient Details  Name: Crystal [name] MRN: [id number] Date of Birth: 1970-07-05 Referring Provider (PT): [place]   Encounter Date: 07/18/2018  PT End of Session - 07/18/18 0949    Visit Number  7    Authorization Type  has CAFA  expires 12/05/18    PT Start Time  0920    PT Stop Time  1005    PT Time Calculation (min)  45 min    Activity Tolerance  Patient tolerated treatment well    Behavior During Therapy  [facility] for tasks assessed/performed       Past Medical History:  Diagnosis Date  . Allergy   . Cancer ([place])    cervical cancer dx.   . History of kidney stones    x1   . Kidney stones    cyst on kidney -no problemsm hx of kidney stones  . 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 PONV (postoperative nausea and vomiting)     Past Surgical History:  Procedure Laterality Date  . BREAST BIOPSY Left 08/28/2015   MRI- High Risk  . BREAST BIOPSY Right 08/28/2015   MRI- Benign  . BREAST EXCISIONAL BIOPSY Left 10/03/2015  . BREAST LUMPECTOMY WITH RADIOACTIVE SEED LOCALIZATION Left 10/03/2015   Procedure: LEFT BREAST LUMPECTOMY WITH RADIOACTIVE SEED LOCALIZATION;  Surgeon: Fanny Skates, MD;  Location: [facility];  Service: General;  Laterality: Left;  . CERVICAL BIOPSY  W/ LOOP ELECTRODE EXCISION     12'16  . CESAREAN SECTION     2 previous  . ROBOTIC ASSISTED TOTAL HYSTERECTOMY Bilateral 07/01/2015   Procedure: XI ROBOTIC ASSISTED RADICAL TYPE II HYSTERECTOMY, BILATERAL SALPINGECTOMY, SENTINAL LYMPH NODE BIOPSY;  Surgeon: Everitt Amber, MD;  Location: [facility];  Service: Gynecology;  Laterality: Bilateral;    There were no vitals filed for this visit.  Subjective Assessment - 07/18/18 0922    Subjective  Pt reports that she is doing  better, hardly any pain all week    Currently in Pain?  No/denies    Pain Score  0-No pain         [facility] PT Assessment - 07/18/18 0001      AROM   Overall AROM   Within functional limits for tasks performed      Strength   Overall Strength  Within functional limits for tasks performed                   [facility] Adult PT Treatment/Exercise - 07/18/18 0001      Neck Exercises: Machines for Strengthening   UBE (Upper Arm Bike)  L3.5 3 min each way    Other Machines for Strengthening  Rows and lats 20lb 2x10       Shoulder Exercises: Standing   External Rotation  Theraband;20 reps    Theraband Level (Shoulder External Rotation)  Level 2 (Red)    Flexion  Weights;20 reps;Both    Shoulder Flexion Weight (lbs)  3    ABduction  Weights;20 reps;Both    Shoulder ABduction Weight (lbs)  3    Extension  Strengthening;Theraband;15 reps;Both   x2   Theraband Level (Shoulder Extension)  Level 3 Nyoka Cowden)    Row  Theraband;15 reps;Both;Strengthening   x2   Theraband Level (Shoulder Row)  Level 3 (Green)  Modalities   Modalities  Electrical Stimulation;Moist Heat      Moist Heat Therapy   Number Minutes Moist Heat  15 Minutes    Moist Heat Location  Shoulder      Electrical Stimulation   Electrical Stimulation Location  Posterior R shoulder    Electrical Stimulation Action  IFC    Electrical Stimulation Parameters  supinr    Electrical Stimulation Goals  Pain               PT Short Term Goals - 05/22/18 1718      PT SHORT TERM GOAL #1   Title  independent with initial HEP    Time  8    Period  Weeks    Status  New        PT Long Term Goals - 07/18/18 0949      PT LONG TERM GOAL #1   Title  understand posture and body mechanics    Status  Achieved      PT LONG TERM GOAL #2   Title  decrease pain 50%    Status  Achieved      PT LONG TERM GOAL #3   Title  increase ROM 25%    Status  Achieved      PT LONG TERM GOAL #4   Title  increase  strength to 4/5 without increase of pain    Status  Achieved            Plan - 07/18/18 0950    Clinical Impression Statement  All goal met, pt is pleases with her current functional status.    Rehab Potential  Good    Clinical Impairments Affecting Rehab Potential  CAFA insurance that will need to be renewed    PT Frequency  2x / week    PT Duration  8 weeks    PT Treatment/Interventions  ADLs/Self Care Home Management;Cryotherapy;Electrical Stimulation;Moist Heat;Ultrasound;Traction;Therapeutic activities;Therapeutic exercise;Patient/family education;Neuromuscular re-education;Manual techniques;Passive range of motion;Dry needling    PT Next Visit Plan  D/C PT       Patient will benefit from skilled therapeutic intervention in order to improve the following deficits and impairments:  Improper body mechanics, Pain, Postural dysfunction, Increased muscle spasms, Decreased range of motion, Decreased strength, Impaired UE functional use  Visit Diagnosis: Cervicalgia  Muscle spasm of back  Acute bilateral low back pain without sciatica     Problem List Patient Active Problem List   Diagnosis Date Noted  . Chest pain 07/28/2017  . Hemoptysis 03/04/2017  . Gastritis 12/06/2016  . Herpes zoster without complication 38/46/6599  . Depression 04/07/2016  . 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 Urinary retention with incomplete bladder emptying 07/23/2015  . Post-operative nausea and vomiting 07/08/2015  . Cervical cancer ([place]) 07/01/2015  . Malignant neoplasm of cervix ([place]) 06/02/2015  . 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 Renal mass, right 04/14/2015  . Periodic health assessment, general screening, adult 02/23/2013   PHYSICAL THERAPY DISCHARGE SUMMARY  Visits from Start of Care: 7  Plan: Patient agrees to discharge.  Patient goals were met. Patient is being discharged due to meeting the stated rehab goals.  ?????     Scot Jun, PTA 07/18/2018, 9:51 AM  [place], [place], [address] Phone: [phone]   Fax:  [phone]  Name: Crystal [name] MRN: [id number] Date of Birth: 05/11/1971

## 2018-07-26 ENCOUNTER — Ambulatory Visit: Payer: Self-pay | Admitting: Family Medicine

## 2018-08-15 ENCOUNTER — Telehealth: Payer: Self-pay | Admitting: *Deleted

## 2018-08-15 NOTE — Telephone Encounter (Signed)
Called and spoke with the patient's daughter, moved appt from 4/1 to 5/1 due to COVID-19

## 2018-08-23 ENCOUNTER — Ambulatory Visit: Payer: Self-pay | Admitting: Gynecologic Oncology

## 2018-09-18 ENCOUNTER — Telehealth: Payer: Self-pay | Admitting: *Deleted

## 2018-09-18 NOTE — Telephone Encounter (Signed)
Patient and the daughter called back, app moved to June 17th

## 2018-09-18 NOTE — Telephone Encounter (Signed)
Called and left the patient's daughter a message to call the office. Need to move her appt to June for a pap smear.

## 2018-09-20 ENCOUNTER — Other Ambulatory Visit: Payer: Self-pay

## 2018-09-20 ENCOUNTER — Ambulatory Visit: Payer: Self-pay | Attending: Family Medicine | Admitting: Family Medicine

## 2018-09-20 ENCOUNTER — Encounter: Payer: Self-pay | Admitting: Family Medicine

## 2018-09-20 DIAGNOSIS — R1011 Right upper quadrant pain: Secondary | ICD-10-CM

## 2018-09-20 DIAGNOSIS — R109 Unspecified abdominal pain: Secondary | ICD-10-CM

## 2018-09-20 DIAGNOSIS — M62838 Other muscle spasm: Secondary | ICD-10-CM

## 2018-09-20 MED ORDER — TRAMADOL HCL 50 MG PO TABS: 50.0000 mg | ORAL_TABLET | Freq: Two times a day (BID) | ORAL | 1 refills | Status: DC | PRN

## 2018-09-20 MED ORDER — TIZANIDINE HCL 4 MG PO TABS: 4.0000 mg | ORAL_TABLET | Freq: Three times a day (TID) | ORAL | 1 refills | Status: DC | PRN

## 2018-09-20 MED FILL — traMADol HCL 50 MG TABS: 50 | 15 days supply | Qty: 30 | Fill #0

## 2018-09-20 MED FILL — tiZANidine HCL 4 MG TABS: 4 | 30 days supply | Qty: 90 | Fill #0

## 2018-09-20 NOTE — Progress Notes (Signed)
Virtual Visit via Telephone Note  I connected with [name], on 09/20/2018 at 4:01 PM by telephone and verified that I am speaking with the correct person using two identifiers.   Consent: I discussed the limitations, risks, security and privacy concerns of performing an evaluation and management service by telephone and the availability of in person appointments. I also discussed with the patient that there may be a patient responsible charge related to this service. The patient expressed understanding and agreed to proceed.   Location of Patient: Home  Location of Provider: Clinic   Persons participating in Telemedicine visit: Luisana Lutzke [name]  [place] -ID number [id number] Ozzie Hoyle Dr. Felecia Shelling     History of Present Illness: Crystal [name]  is a 48 year old woman with a history of GERD, Stage 1B cervical cancer status post robotic-assisted type III radical laparoscopic hysterectomy with bilateral salpingectomy and bilateral sentinel lymph node biopsy in 06/2015 (no residual carcinoma as per GYN ) here for a follow-up visit.   She continues to complain of low back pain in her right flank which radiates anteriorly to her right upper quadrant and has not improved despite using muscle relaxant and tramadol.  She now uses peppermint tea with some relief. I had referred her for physical therapy which she states has helped her neck and back pain but not flank. CT abdomen and Pelvis from 03/21/18 revealed: IMPRESSION: No acute abnormality.  9 mm right upper pole renal cyst is stable  Right adnexal cyst 2.2 x 2.7 cm.  No free fluid.  GYN had said pain was unlikely from cyst identified in her CT abdomen and pelvis.  She has an upcoming appointment with GYN but this has been rescheduled repeatedly due to the COVID-19 pandemic. She denies urinary symptoms, vaginal bleeding, weight loss. Past Medical History:  Diagnosis Date   . Allergy   . Cancer ([place])    cervical cancer dx.   . History of kidney stones    x1   . Kidney stones    cyst on kidney -no problemsm hx of kidney stones  . 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 PONV (postoperative nausea and vomiting)    No Known Allergies  Current Outpatient Medications on File Prior to Visit  Medication Sig Dispense Refill  . alum & mag hydroxide-simeth (MAALOX/MYLANTA) 200-200-20 MG/5ML suspension Take by mouth every 6 (six) hours as needed for indigestion or heartburn.    . bisacodyl (DULCOLAX) 5 MG EC tablet Take 5 mg by mouth daily as needed for moderate constipation.    Marland Kitchen omeprazole (PRILOSEC) 40 MG capsule Take 1 capsule (40 mg total) by mouth daily. 30 capsule 3  . tiZANidine (ZANAFLEX) 4 MG tablet Take 1 tablet (4 mg total) by mouth every 8 (eight) hours as needed for muscle spasms. 90 tablet 1  . hydrOXYzine (ATARAX/VISTARIL) 25 MG tablet Take 1 tablet (25 mg total) by mouth 3 (three) times daily as needed. (Patient not taking: Reported on 04/26/2018) 60 tablet 1  . lubiprostone (AMITIZA) 24 MCG capsule Take 1 capsule (24 mcg total) by mouth 2 (two) times daily with a meal. (Patient not taking: Reported on 04/26/2018) 60 capsule 3  . Naproxen Sodium (FLANAX PAIN RELIEF PO) Take 220 mg by mouth as needed.    . traMADol (ULTRAM) 50 MG tablet Take 1 tablet (50 mg total) by mouth every 12 (twelve) hours as needed. (Patient not taking: Reported on 04/26/2018) 30 tablet  0   Current Facility-Administered Medications on File Prior to Visit  Medication Dose Route Frequency Provider Last Rate Last Dose  . 0.9 %  sodium chloride infusion  500 mL Intravenous Once Nandigam, Kavitha V, MD      . 0.9 %  sodium chloride infusion  500 mL Intravenous Once Nandigam, Venia Minks, MD        Observations/Objective: Alert, awake, oriented x3 Not in acute distress  Assessment and Plan: 1. Muscle spasm Completed course of physical therapy but  pain still persist - tiZANidine (ZANAFLEX) 4 MG tablet; Take 1 tablet (4 mg total) by mouth every 8 (eight) hours as needed for muscle spasms.  Dispense: 90 tablet; Refill: 1  2. Right flank pain See #1 above Work-up so far unrevealing, she currently does not have symptoms of urinary tract infection or pyelonephritis  She will need to see GYN for evaluation of right flank pain due to previous history of cervical neoplasm.  Her appointment with GYN has been rescheduled due to ongoing pandemic. - traMADol (ULTRAM) 50 MG tablet; Take 1 tablet (50 mg total) by mouth every 12 (twelve) hours as needed.  Dispense: 30 tablet; Refill: 1    Follow Up Instructions: Return in about 3 months (around 12/20/2018).    I discussed the assessment and treatment plan with the patient. The patient was provided an opportunity to ask questions and all were answered. The patient agreed with the plan and demonstrated an understanding of the instructions.   The patient was advised to call back or seek an in-person evaluation if the symptoms worsen or if the condition fails to improve as anticipated.     I provided 15 minutes total of non-face-to-face time during this encounter including median intraservice time, reviewing previous notes, labs, imaging, medications and explaining diagnosis and management.     Charlott Rakes, MD, FAAFP. [facility] and [place], [place]   09/20/2018, 4:01 PM

## 2018-09-20 NOTE — Progress Notes (Signed)
Patient has been called and DOB has been verified. Patient has been screened and transferred to PCP to start phone visit.  C/C: Lower back pain.

## 2018-09-22 ENCOUNTER — Ambulatory Visit: Payer: Self-pay | Admitting: Gynecologic Oncology

## 2018-10-17 ENCOUNTER — Ambulatory Visit: Payer: Self-pay | Attending: Family Medicine | Admitting: Family Medicine

## 2018-10-17 ENCOUNTER — Other Ambulatory Visit: Payer: Self-pay

## 2018-10-17 ENCOUNTER — Encounter: Payer: Self-pay | Admitting: Family Medicine

## 2018-10-17 DIAGNOSIS — F419 Anxiety disorder, unspecified: Secondary | ICD-10-CM

## 2018-10-17 DIAGNOSIS — G4489 Other headache syndrome: Secondary | ICD-10-CM

## 2018-10-17 MED ORDER — AMITRIPTYLINE HCL 10 MG PO TABS: 10.0000 mg | ORAL_TABLET | Freq: Every day | ORAL | 1 refills | Status: DC

## 2018-10-17 MED ORDER — HYDROXYZINE HCL 25 MG PO TABS: 25.0000 mg | ORAL_TABLET | Freq: Three times a day (TID) | ORAL | 1 refills | Status: DC | PRN

## 2018-10-17 MED FILL — hydrOXYzine HCL 25 MG TABS: 25 | 20 days supply | Qty: 60 | Fill #0

## 2018-10-17 MED FILL — AMITRIPTYLINE HCL 10 MG TAB: 10 | 30 days supply | Qty: 30 | Fill #0

## 2018-10-17 NOTE — Progress Notes (Signed)
Patient has been called and DOB has been verified. Patient has been screened and transferred to PCP to start phone visit.     

## 2018-10-17 NOTE — Progress Notes (Signed)
Virtual Visit via Telephone Note  I connected with Crystal [name], on 10/17/2018 at 1:59 PM by telephone due to the COVID-19 pandemic and verified that I am speaking with the correct person using two identifiers.   Consent: I discussed the limitations, risks, security and privacy concerns of performing an evaluation and management service by telephone and the availability of in person appointments. I also discussed with the patient that there may be a patient responsible charge related to this service. The patient expressed understanding and agreed to proceed.   Location of Patient: Home  Location of Provider: Clinic   Persons participating in Telemedicine visit: Zoiey Christy [name]  Leanna Sato ID# [place]     History of Present Illness: Crystal [name]  is a 48 year old woman with a history of GERD, Stage 1B cervical cancer status post robotic-assisted type III radical laparoscopic hysterectomy with bilateral salpingectomy and bilateral sentinel lymph node biopsy in 06/2015 (no residual carcinoma as per GYN ) here for a follow-up visit.  She complains of right-sided headaches radiating to her right ear, right neck, right shoulder for the last 3 weeks which sometimes makes her face pulsatile.  Naproxen has provided minimal relief but she is finding she has to use this frequently.  She denies photophobia, phonophobia but headache is severe.  Sometimes has tearing from her right eye but no redness, no rhinorrhea. She endorses being stressed especially with the ongoing COVID-19 pandemic and has been anxious.  Hydroxyzine appears on her med list however she has not been taking this. She has a right shoulder lump in her posterior right shoulder which is a size of a grapefruit. Denies allergy symptoms, denies insomnia.   Past Medical History:  Diagnosis Date  . Allergy   . Cancer ([place])    cervical cancer dx.   . History of kidney stones    x1   .  Kidney stones    cyst on kidney -no problemsm hx of kidney stones  . 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 PONV (postoperative nausea and vomiting)    No Known Allergies  Current Outpatient Medications on File Prior to Visit  Medication Sig Dispense Refill  . Naproxen Sodium (FLANAX PAIN RELIEF PO) Take 220 mg by mouth as needed.    Marland Kitchen alum & mag hydroxide-simeth (MAALOX/MYLANTA) 200-200-20 MG/5ML suspension Take by mouth every 6 (six) hours as needed for indigestion or heartburn.    . bisacodyl (DULCOLAX) 5 MG EC tablet Take 5 mg by mouth daily as needed for moderate constipation.    . hydrOXYzine (ATARAX/VISTARIL) 25 MG tablet Take 1 tablet (25 mg total) by mouth 3 (three) times daily as needed. (Patient not taking: Reported on 04/26/2018) 60 tablet 1  . lubiprostone (AMITIZA) 24 MCG capsule Take 1 capsule (24 mcg total) by mouth 2 (two) times daily with a meal. (Patient not taking: Reported on 04/26/2018) 60 capsule 3  . omeprazole (PRILOSEC) 40 MG capsule Take 1 capsule (40 mg total) by mouth daily. (Patient not taking: Reported on 10/17/2018) 30 capsule 3  . tiZANidine (ZANAFLEX) 4 MG tablet Take 1 tablet (4 mg total) by mouth every 8 (eight) hours as needed for muscle spasms. (Patient not taking: Reported on 10/17/2018) 90 tablet 1  . traMADol (ULTRAM) 50 MG tablet Take 1 tablet (50 mg total) by mouth every 12 (twelve) hours as needed. (Patient not taking: Reported on 10/17/2018) 30 tablet 1   Current Facility-Administered Medications  on File Prior to Visit  Medication Dose Route Frequency Provider Last Rate Last Dose  . 0.9 %  sodium chloride infusion  500 mL Intravenous Once Nandigam, Kavitha V, MD      . 0.9 %  sodium chloride infusion  500 mL Intravenous Once Nandigam, Venia Minks, MD        Observations/Objective: Awake, alert, oriented x3 Not in acute distress  Assessment and Plan: 1. Other headache syndrome Migraine headache very likely Due to  frequency we will add on amitriptyline for prophylaxis and she will use naproxen for breakthrough - amitriptyline (ELAVIL) 10 MG tablet; Take 1 tablet (10 mg total) by mouth at bedtime.  Dispense: 30 tablet; Refill: 1  2. Anxiety Uncontrolled and this has been heightened due to the ongoing COVID-19 pandemic Resume hydroxyzine - hydrOXYzine (ATARAX/VISTARIL) 25 MG tablet; Take 1 tablet (25 mg total) by mouth 3 (three) times daily as needed.  Dispense: 60 tablet; Refill: 1   Advised to come in for an in person visit for evaluation of right shoulder lump  Follow Up Instructions: Return in about 2 weeks (around 10/31/2018) for shoulder lump.    I discussed the assessment and treatment plan with the patient. The patient was provided an opportunity to ask questions and all were answered. The patient agreed with the plan and demonstrated an understanding of the instructions.   The patient was advised to call back or seek an in-person evaluation if the symptoms worsen or if the condition fails to improve as anticipated.     I provided 15 minutes total of non-face-to-face time during this encounter including median intraservice time, reviewing previous notes, labs, imaging, medications, management and patient verbalized understanding.     Charlott Rakes, MD, FAAFP. [facility] and [place], [place]   10/17/2018, 1:59 PM

## 2018-10-31 ENCOUNTER — Ambulatory Visit: Payer: Self-pay | Admitting: Family Medicine

## 2018-11-07 ENCOUNTER — Telehealth: Payer: Self-pay

## 2018-11-07 NOTE — Telephone Encounter (Signed)
ENCOUNTER OPENED IN ERROR

## 2018-11-08 ENCOUNTER — Other Ambulatory Visit: Payer: Self-pay

## 2018-11-08 ENCOUNTER — Encounter: Payer: Self-pay | Admitting: Gynecologic Oncology

## 2018-11-08 ENCOUNTER — Inpatient Hospital Stay: Payer: Self-pay | Attending: Gynecologic Oncology | Admitting: Gynecologic Oncology

## 2018-11-08 ENCOUNTER — Other Ambulatory Visit (HOSPITAL_COMMUNITY)
Admission: RE | Admit: 2018-11-08 | Discharge: 2018-11-08 | Disposition: A | Discharge status: Home / Self Care | Payer: Self-pay | Source: Ambulatory Visit | Attending: Gynecologic Oncology | Admitting: Gynecologic Oncology

## 2018-11-08 VITALS — BP 110/64 | HR 64 | Temp 98.5°F | Resp 18 | Ht 59.0 in | Wt 121.1 lb

## 2018-11-08 DIAGNOSIS — C539 Malignant neoplasm of cervix uteri, unspecified: Secondary | ICD-10-CM | POA: Insufficient documentation

## 2018-11-08 DIAGNOSIS — Z8541 Personal history of malignant neoplasm of cervix uteri: Secondary | ICD-10-CM | POA: Insufficient documentation

## 2018-11-08 DIAGNOSIS — Z08 Encounter for follow-up examination after completed treatment for malignant neoplasm: Secondary | ICD-10-CM | POA: Insufficient documentation

## 2018-11-08 DIAGNOSIS — Z90710 Acquired absence of both cervix and uterus: Secondary | ICD-10-CM | POA: Insufficient documentation

## 2018-11-08 DIAGNOSIS — Z90722 Acquired absence of ovaries, bilateral: Secondary | ICD-10-CM | POA: Insufficient documentation

## 2018-11-08 NOTE — Progress Notes (Signed)
Follow Up Note: Gyn-Onc  Crystal [name] 48 y.o. female  CC:  Chief Complaint  Patient presents with  . Malignant neoplasm of cervix, unspecified site [facility])   Assessment/Plan:  48 year old s/p robotic-assisted type III radical laparoscopic hysterectomy with bilateral salpingectomy and bilateral sentinel lymph node biopsy on 07/01/15 for stage IB1 cervical SCC.  No residual carcinoma on hysterectomy specimen. Low risk features to her tumor therefore no adjuvant therapy recommended. No evidence for recurrence on today's exam.  1/ we will continue annual pap surveillance for this with hpv cotesting (next due in June, 2021).  2/ I discussed symptoms concerning for recurrence including vaginal bleeding or discharge, pelvic pain, cough, or lower extremity edema.  3/ Follow-up for surveillance 6 monthly until February, 2022.    HPI: Crystal [name] is a 48 year old female initially referred by Dr. Harolyn [name] for clinical stage IB1 (microscopic) cervical SCC on LEEP.  She had her first abnormal Pap smear on 02/26/2015 which was HGSIL. She then underwent colposcopic evaluation of the cervix on 04/02/2015, which was benign in appearance. A biopsy from the ectocervix was benign and endocervical curetting showed CIN-3. As follow-up, she underwent a LEEP procedure on 05/12/2015. 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 Dimensions of the carcinoma were not included however there was a focus of dysplasia suspicious for invasive carcinoma involving the deep margin.  PET on 06/20/15: IMPRESSION: 1. No primary hypermetabolic primary cervical mass or evidence of nodal metastasis. 2. Focal hypermetabolism in the lateral left breast. No CT correlate. Consider correlation with diagnostic mammography and ultrasound to exclude otherwise occult breast lesion. 3. Probable left nephrolithiasis  On 07/01/15, she underwent a  robotic-assisted type III radical laparoscopic hysterectomy with bilateral salpingoophorectomy and bilateral pelvic lymphadenectomy by Dr. Denman George.  Final pathology revealed: Diagnosis 1. Uterus +/- tubes/ovaries, neoplastic, cervix CERVIX WITH LOW GRADE SQUAMOUS INTRAEPITHELIAL LESION, CIN-1 NO RESIDUAL HIGH GRADE INTRAEPITHELIAL LESION OR INVASIVE NEOPLASM IDENTIFIED VAGINAL CUFF, PARAMETRIAL AND ALL RESECTION MARGINS ARE NEGATIVE FOR TUMOR ENDOMETRIAL POLYP AND INACTIVE ENDOMETRIUM MYOMETRIUM: NO PATHOLOGICAL ALTERATIONS BILATERAL FALLOPIAN TUBES: HISTOLOGICAL UNREMARKABLE 2. Lymph node, sentinel, biopsy, right external iliac ONE BENIGN LYMPH NODE (0/1) 3. Lymph node, sentinel, biopsy, left obturator ONE BENIGN LYMPH NODE (0/1)  She was re-admitted to the hospital on POD 7, 07/08/15, for persistent nausea, vomiting, dehydration, no BM, dyspnea.  CT CAP on 07/08/15 failed to demonstrate evidence for intraperitoneal abscess/GU or GI injury or pathology including no obstruction. UA and culture revealed a catheter associated UTI (e coli - pan sensitive) which was treated with cipro. She was discharged home on 07/10/15 after IV hydration, aggressive bowel regimen.  She failed a voiding trial during her hospitalization and the foley had to be replaced due to urinary retention.  She presented to the office on 07/15/15 for a repeat voiding trial. She passed this well and her foley was left out, however she subsequently developed urinary retention and required teaching to intermittently self catheterize.  On 10/03/2015 she underwent a breast lumpectomy on the left which confirmed lobular hyperplasia but no carcinoma in situ identified. No additional therapy is required for this.  She was worked up for epigastric pain by GI in December, 2018 with an EGD which confirmed GERD.  She had CT abd/pelvis in March, 2019 for flank pain which showed no evidence of recurrent cancer.   Interval History:   She  reported right flank pain in October,  2019. CT abd/pelvis on 03/21/18 showed no evidence of recurrence.  There was an acquired right renal cyst measuring 9 mm that was simple in appearance and unchanged.  The right adnexa contained a 2.2 x 2.7 cm cyst.  No free fluid or signs of malignancy were identified.  The patient reports that the pain improved with taking mint tea.   Review of Systems  Constitutional: Feels well.  No fever, chills, early satiety, or unintentional weight loss or gain.  Cardiovascular: No chest pain, shortness of breath, or edema.  Pulmonary: coughs up blood Gastrointestinal: No nausea, vomiting, or diarrhea. No bright red blood per rectum or change in bowel movement.  Genitourinary: Minimal bladder sensation. No vaginal bleeding or discharge.  Musculoskeletal: No myalgia or joint pain. Neurologic: No weakness, numbness, or change in gait.  Psychology: No depression, anxiety, or insomnia.  Current Meds:  Outpatient Encounter Medications as of 11/08/2018  Medication Sig  . alum & mag hydroxide-simeth (MAALOX/MYLANTA) 200-200-20 MG/5ML suspension Take by mouth every 6 (six) hours as needed for indigestion or heartburn.  Marland Kitchen amitriptyline (ELAVIL) 10 MG tablet Take 1 tablet (10 mg total) by mouth at bedtime.  . bisacodyl (DULCOLAX) 5 MG EC tablet Take 5 mg by mouth daily as needed for moderate constipation.  . hydrOXYzine (ATARAX/VISTARIL) 25 MG tablet Take 1 tablet (25 mg total) by mouth 3 (three) times daily as needed.  . lubiprostone (AMITIZA) 24 MCG capsule Take 1 capsule (24 mcg total) by mouth 2 (two) times daily with a meal. (Patient not taking: Reported on 04/26/2018)  . Naproxen Sodium (FLANAX PAIN RELIEF PO) Take 220 mg by mouth as needed.  Marland Kitchen omeprazole (PRILOSEC) 40 MG capsule Take 1 capsule (40 mg total) by mouth daily. (Patient not taking: Reported on 10/17/2018)  . tiZANidine (ZANAFLEX) 4 MG tablet Take 1 tablet (4 mg total) by mouth every 8 (eight) hours as needed  for muscle spasms. (Patient not taking: Reported on 10/17/2018)  . traMADol (ULTRAM) 50 MG tablet Take 1 tablet (50 mg total) by mouth every 12 (twelve) hours as needed. (Patient not taking: Reported on 10/17/2018)   Facility-Administered Encounter Medications as of 11/08/2018  Medication  . 0.9 %  sodium chloride infusion  . 0.9 %  sodium chloride infusion    Allergy: No Known Allergies  Social Hx:   Social History   Socioeconomic History  . Marital status: Widowed    Spouse name: Not on file  . Number of children: 2  . Years of education: Not on file  . Highest education level: Not on file  Occupational History  . Not on file  Social Needs  . Financial resource strain: Not on file  . Food insecurity    Worry: Not on file    Inability: Not on file  . Transportation needs    Medical: Not on file    Non-medical: Not on file  Tobacco Use  . Smoking status: Never Smoker  . Smokeless tobacco: Never Used  Substance and Sexual Activity  . Alcohol use: Yes    Comment: occasionally  . Drug use: No  . Sexual activity: Not Currently    Birth control/protection: Surgical  Lifestyle  . Physical activity    Days per week: Not on file    Minutes per session: Not on file  . Stress: Not on file  Relationships  . Social Herbalist on phone: Not on file    Gets together: Not on file    Attends  religious service: Not on file    Active member of club or organization: Not on file    Attends meetings of clubs or organizations: Not on file    Relationship status: Not on file  . Intimate partner violence    Fear of current or ex partner: Not on file    Emotionally abused: Not on file    Physically abused: Not on file    Forced sexual activity: Not on file  Other Topics Concern  . Not on file  Social History Narrative  . Not on file    Past Surgical Hx:  Past Surgical History:  Procedure Laterality Date  . BREAST BIOPSY Left 08/28/2015   MRI- High Risk  . BREAST  BIOPSY Right 08/28/2015   MRI- Benign  . BREAST EXCISIONAL BIOPSY Left 10/03/2015  . BREAST LUMPECTOMY WITH RADIOACTIVE SEED LOCALIZATION Left 10/03/2015   Procedure: LEFT BREAST LUMPECTOMY WITH RADIOACTIVE SEED LOCALIZATION;  Surgeon: Fanny Skates, MD;  Location: [facility];  Service: General;  Laterality: Left;  . CERVICAL BIOPSY  W/ LOOP ELECTRODE EXCISION     12'16  . CESAREAN SECTION     2 previous  . ROBOTIC ASSISTED TOTAL HYSTERECTOMY Bilateral 07/01/2015   Procedure: XI ROBOTIC ASSISTED RADICAL TYPE II HYSTERECTOMY, BILATERAL SALPINGECTOMY, SENTINAL LYMPH NODE BIOPSY;  Surgeon: Everitt Amber, MD;  Location: [facility];  Service: Gynecology;  Laterality: Bilateral;    Past Medical Hx:  Past Medical History:  Diagnosis Date  . Allergy   . Cancer ([place])    cervical cancer dx.   . History of kidney stones    x1   . Kidney stones    cyst on kidney -no problemsm hx of kidney stones  . 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 PONV (postoperative nausea and vomiting)     Family Hx:  Family History  Problem Relation Age of Onset  . Hypertension Sister   . Colon cancer Neg Hx   . Esophageal cancer Neg Hx   . Rectal cancer Neg Hx   . Stomach cancer Neg Hx     Vitals:  Blood pressure 110/64, pulse 64, temperature 98.5 F (36.9 C), temperature source Oral, resp. rate 18, height 4\' 11"  (1.499 m), weight 121 lb 1.6 oz (54.9 kg), last menstrual period 06/07/2015, SpO2 100 %.  Physical Exam:  General: Well developed, well nourished female in no acute distress. Alert and oriented x 3.  Cardiovascular: Regular rate and rhythm. S1 and S2 normal.  Lungs: Clear to auscultation bilaterally. No wheezes/crackles/rhonchi noted.  Skin: No rashes or lesions present. Back: No CVA tenderness.  Abdomen: Abdomen soft, non-tender and non-obese. Active bowel sounds in all quadrants. No evidence of a fluid wave or abdominal masses.  Lap sites  soft. Extremities: No bilateral cyanosis, edema, or clubbing.  Pelvic exam: No residual granulation tissue at cuff. No nodulariy or lesions. Pap taken.  Rectal: smooth, no nodularity or masses   Thereasa Solo, MD  11/08/2018, 1:37 PM

## 2018-11-08 NOTE — Patient Instructions (Signed)
Please notify Dr Denman George at phone number [phone] if you notice vaginal bleeding, new pelvic or abdominal pains, bloating, feeling full easy, or a change in bladder or bowel function.  Please contact Dr Serita Grit office (at [phone]) in September, 2020 to request an appointment with her for December, 2020.  Dr Serita Grit office will contact you with the pathology report from today's pap test. It is next due again in June, 2021

## 2018-11-10 LAB — CYTOLOGY - PAP
Diagnosis: NEGATIVE
HPV: NOT DETECTED

## 2018-11-14 ENCOUNTER — Telehealth: Payer: Self-pay

## 2018-11-14 NOTE — Telephone Encounter (Signed)
Told daughter the results of the pap smear as noted below by Melissa Cross,NP. Daughter will inform her mother of these results. Daughter will call in September to schedule a follow up appointment with Dr. Denman George in December for the mother.

## 2018-11-14 NOTE — Telephone Encounter (Signed)
-----   Message from Dorothyann Gibbs, NP sent at 11/10/2018  5:28 PM EDT ----- Normal pap with HPV negative. Please let her know

## 2018-12-08 ENCOUNTER — Telehealth: Payer: Self-pay | Admitting: Family Medicine

## 2018-12-08 MED FILL — AMITRIPTYLINE HCL 10 MG TAB: 10 | 30 days supply | Qty: 30 | Fill #1

## 2018-12-08 NOTE — Telephone Encounter (Signed)
Patient came in requesting 3 medications she states she was prescribed July 8th by someone here in the office. Informed patient no record of an appointment or notes in regards to medications. Please follow up.

## 2018-12-12 NOTE — Telephone Encounter (Signed)
Pt was not seen here on July 8th. There is not even an encounter for that day

## 2018-12-26 MED FILL — KETOROLAC 0.5% OPHTH SOLN: 0.5 | 38 days supply | Qty: 10 | Fill #0

## 2018-12-26 MED FILL — NEOMYC-POLYM-DEXAMET EYE OI: 3.5-10000-0 | 7 days supply | Qty: 4 | Fill #0

## 2019-02-09 ENCOUNTER — Telehealth: Payer: Self-pay | Admitting: *Deleted

## 2019-02-09 NOTE — Telephone Encounter (Signed)
Paient's daughter called and scheduled a follow up appt. Patient is having "pain like before" and "some bloating" no bleeding or discharge   appt scheduled

## 2019-02-13 ENCOUNTER — Ambulatory Visit: Payer: Self-pay

## 2019-02-19 ENCOUNTER — Encounter: Payer: Self-pay | Admitting: Family Medicine

## 2019-02-19 ENCOUNTER — Ambulatory Visit: Payer: Self-pay | Attending: Family Medicine | Admitting: Family Medicine

## 2019-02-19 ENCOUNTER — Other Ambulatory Visit: Payer: Self-pay

## 2019-02-19 VITALS — BP 105/64 | HR 98 | Temp 98.5°F | Ht 59.0 in | Wt 120.0 lb

## 2019-02-19 DIAGNOSIS — G4709 Other insomnia: Secondary | ICD-10-CM

## 2019-02-19 DIAGNOSIS — G4489 Other headache syndrome: Secondary | ICD-10-CM

## 2019-02-19 DIAGNOSIS — R1030 Lower abdominal pain, unspecified: Secondary | ICD-10-CM

## 2019-02-19 DIAGNOSIS — M65311 Trigger thumb, right thumb: Secondary | ICD-10-CM

## 2019-02-19 DIAGNOSIS — K5900 Constipation, unspecified: Secondary | ICD-10-CM

## 2019-02-19 MED ORDER — NAPROXEN 500 MG PO TABS: 500.0000 mg | ORAL_TABLET | Freq: Two times a day (BID) | ORAL | 1 refills | Status: DC

## 2019-02-19 MED ORDER — LACTULOSE 10 GM/15ML PO SOLN: 10.0000 g | Freq: Two times a day (BID) | ORAL | 1 refills | Status: DC

## 2019-02-19 MED ORDER — BUTALBITAL-APAP-CAFFEINE 50-325-40 MG PO TABS: 1.0000 | ORAL_TABLET | Freq: Two times a day (BID) | ORAL | 1 refills | Status: DC | PRN

## 2019-02-19 MED ORDER — TRAZODONE HCL 50 MG PO TABS: 50.0000 mg | ORAL_TABLET | Freq: Every day | ORAL | 3 refills | Status: DC

## 2019-02-19 MED FILL — traZODone HCL 50 MG TABS: 50 | 30 days supply | Qty: 30 | Fill #0

## 2019-02-19 MED FILL — NAPROXEN 500 MG TABLET: 500 | 15 days supply | Qty: 30 | Fill #0

## 2019-02-19 MED FILL — LACTULOSE 10 GM/15 ML SOLN: 10 | 31 days supply | Qty: 946 | Fill #0

## 2019-02-19 MED FILL — BUTALB-ACETAMIN-CAFF 50-325: 50-325-40 | 15 days supply | Qty: 30 | Fill #0

## 2019-02-19 NOTE — Patient Instructions (Signed)
Dedo en gatillo Trigger Finger  El dedo en gatillo (tenosinovitis estenosante) es una afeccin que causa que el dedo quede trabado en una posicin doblada. Cada dedo tiene un tejido duro, parecido a una cuerda, que conecta el msculo al hueso (tendn), y cada tendn est rodeado por un tnel de tejido (vaina tendinosa). Para mover el dedo, el tendn debe deslizarse libremente por la vaina. El dedo en gatillo ocurre cuando el tendn o la vaina se engrosan, dificultando el movimiento del dedo. El dedo en gatillo puede afectar cualquier dedo o Counselling psychologist. Puede verse afectado ms de un dedo. Los casos leves pueden resolverse con descanso y medicamentos. Los casos graves conllevan ms tratamiento. Cules son las causas? La causa del dedo en gatillo es el engrosamiento del tendn o de la vaina tendinosa de un dedo. Se desconoce la causa de ese engrosamiento. Qu incrementa el riesgo? Los siguientes factores pueden hacer que usted sea ms propenso a Best boy esta afeccin:  Hacer actividades que exigen un agarre fuerte.  Tener artritis reumatoide, gota o diabetes.  Tener entre 47 y 69aos.  Ser mujer. Cules son los signos o los sntomas? Los sntomas de esta afeccin [employer] siguientes:  Dolor al doblar o Naval architect dedo.  Dolor a la palpacin o Estate agent donde el dedo se une a la palma de la [place].  Una protuberancia en la palma de la mano o en la parte interna del dedo.  Or un chasquido al intentar enderezar el dedo.  Tener una sensacin de chasquido, enganche o traba al intentar enderezar el dedo.  No poder enderezar el dedo. Cmo se diagnostica? Esta afeccin se diagnostica en funcin de los sntomas y de un examen fsico. Cmo se trata? El tratamiento de esta afeccin puede incluir lo siguiente:  Arts development officer dedo y Product/process development scientist actividades que [employer] sntomas.  Usar un entablillado para el dedo para mantenerlo en una posicin levemente doblada.  Tomar  antiinflamatorios no esteroides Dayna Ramus) para Best boy y reducir la hinchazn.  Inyectar medicamentos (corticoesteroides) en la vaina tendinosa para disminuir la hinchazn y la irritacin. Es posible que sea necesario repetir las inyecciones.  Someterse a una ciruga para abrir la vaina tendinosa. Esta puede ser Ardelia Mems opcin si otros tratamientos no son eficaces y no puede Naval architect dedo. Es probable que necesite fisioterapia luego de la [place]. Siga estas indicaciones en su casa:   Use calor hmedo para ayudar a reducir Conservation officer, historic buildings y la hinchazn como se lo haya indicado el mdico.  Descanse el dedo y evite las actividades que empeoren Conservation officer, historic buildings. Retome sus actividades normales como se lo haya indicado el mdico.  Si tiene una frula, sela como se lo haya indicado el mdico.  Tome los medicamentos de venta libre y los recetados solamente como se lo haya indicado el mdico.  Consulting civil engineer a todas las visitas de control como se lo haya indicado el mdico. Esto es importante. Comunquese con un mdico si:  Los sntomas no mejoran con el Art therapist. Resumen  El dedo en gatillo (tenosinovitis estenosante) es una afeccin que hace que el dedo quede trabado en posicin doblada y puede causar dificultad y dolor para enderezar el dedo.  Esta afeccin se desarrolla cuando se engrosa el tendn o la vaina tendinosa de un dedo.  El tratamiento comienza con descanso, usar un entablillado y tomar antiinflamatorios no esteroides (AINE).  En los [place], [place] ser necesaria Ardelia Mems ciruga para abrir la vaina tendinosa.  Esta informacin no tiene Marine scientist el consejo del mdico. Asegrese de hacerle al mdico cualquier pregunta que tenga. Document Released: 01/10/2013 Document Revised: 02/03/2017 Document Reviewed: 10/10/2012 Elsevier Patient Education  2020 [employer].

## 2019-02-20 LAB — BASIC METABOLIC PANEL
BUN/Creatinine Ratio: 18 (ref 9–23)
BUN: 18 mg/dL (ref 6–24)
CO2: 22 mmol/L (ref 20–29)
Calcium: 9.6 mg/dL (ref 8.7–10.2)
Chloride: 104 mmol/L (ref 96–106)
Creatinine, Ser: 1 mg/dL (ref 0.57–1.00)
GFR calc Af Amer: 76 mL/min/{1.73_m2} (ref >59)
GFR calc non Af Amer: 66 mL/min/{1.73_m2} (ref >59)
Glucose: 102 mg/dL — ABNORMAL HIGH (ref 65–99)
Potassium: 4.1 mmol/L (ref 3.5–5.2)
Sodium: 141 mmol/L (ref 134–144)

## 2019-02-20 NOTE — Progress Notes (Signed)
Subjective:  Patient ID: Crystal [name], female    DOB: [birth year]  Age: 48 y.o. MRN: [id number]  CC: Abdominal Pain and Headache   HPI Crystal [name]  is a 48 year old woman with a history of GERD, Stage 1B cervical cancer status post robotic-assisted type III radical laparoscopic hysterectomy with bilateral salpingectomy and bilateral sentinel lymph node biopsy in 06/2015 (no residual carcinoma as per GYN ) here for a follow-up visit. She complains of lower abdominal pain which is chronic and describes this as radiating to her right side and is described as moderate.  She complains of abdominal swelling and has also had some constipation which has not been relieved by Amitiza.  Denies nausea, vomiting, reflux. CT scan from 02/2018 revealed right renal adnexal cyst, 9 mm upper pole renal cyst. She had her follow-up with GYN in 10/2018 and vaginal Pap smear was negative for malignancy. She complains of insomnia as she gets only 1 to 2 hours of sleep at night and denies caffeine intake.  Also endorses headaches for the last 3 weeks which are generalized and described as mild.  Denies photophobia, phonophobia or visual concerns. Complains of sticking of both thumbs but is unsure of duration.  Symptoms are not associated with pain.  Past Medical History:  Diagnosis Date   Allergy    Cancer ([place])    cervical cancer dx.    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   not cancer per pt   PONV (postoperative nausea and vomiting)     Past Surgical History:  Procedure Laterality Date   BREAST BIOPSY Left 08/28/2015   MRI- High Risk   BREAST BIOPSY Right 08/28/2015   MRI- Benign   BREAST EXCISIONAL BIOPSY Left 10/03/2015   BREAST LUMPECTOMY WITH RADIOACTIVE SEED LOCALIZATION Left 10/03/2015   Procedure: LEFT BREAST LUMPECTOMY WITH RADIOACTIVE SEED  LOCALIZATION;  Surgeon: Fanny Skates, MD;  Location: [facility];  Service: General;  Laterality: Left;   CERVICAL BIOPSY  W/ LOOP ELECTRODE EXCISION     12'16   CESAREAN SECTION     2 previous   ROBOTIC ASSISTED TOTAL HYSTERECTOMY Bilateral 07/01/2015   Procedure: XI ROBOTIC ASSISTED RADICAL TYPE II HYSTERECTOMY, BILATERAL SALPINGECTOMY, SENTINAL LYMPH NODE BIOPSY;  Surgeon: Everitt Amber, MD;  Location: [facility];  Service: Gynecology;  Laterality: Bilateral;    Family History  Problem Relation Age of Onset   Hypertension Sister    Colon cancer Neg Hx    Esophageal cancer Neg Hx    Rectal cancer Neg Hx    Stomach cancer Neg Hx     No Known Allergies  Outpatient Medications Prior to Visit  Medication Sig Dispense Refill   alum & mag hydroxide-simeth (MAALOX/MYLANTA) 200-200-20 MG/5ML suspension Take by mouth every 6 (six) hours as needed for indigestion or heartburn.     bisacodyl (DULCOLAX) 5 MG EC tablet Take 5 mg by mouth daily as needed for moderate constipation.     lubiprostone (AMITIZA) 24 MCG capsule Take 1 capsule (24 mcg total) by mouth 2 (two) times daily with a meal. (Patient not taking: Reported on 04/26/2018) 60 capsule 3   omeprazole (PRILOSEC) 40 MG capsule Take 1 capsule (40 mg total) by mouth daily. (Patient not taking: Reported on 10/17/2018) 30 capsule 3   tiZANidine (ZANAFLEX) 4 MG tablet Take 1  tablet (4 mg total) by mouth every 8 (eight) hours as needed for muscle spasms. (Patient not taking: Reported on 10/17/2018) 90 tablet 1   amitriptyline (ELAVIL) 10 MG tablet Take 1 tablet (10 mg total) by mouth at bedtime. (Patient not taking: Reported on 02/19/2019) 30 tablet 1   hydrOXYzine (ATARAX/VISTARIL) 25 MG tablet Take 1 tablet (25 mg total) by mouth 3 (three) times daily as needed. (Patient not taking: Reported on 02/19/2019) 60 tablet 1   Naproxen Sodium (FLANAX PAIN RELIEF PO) Take 220 mg by mouth as needed.     traMADol (ULTRAM) 50 MG  tablet Take 1 tablet (50 mg total) by mouth every 12 (twelve) hours as needed. (Patient not taking: Reported on 10/17/2018) 30 tablet 1   Facility-Administered Medications Prior to Visit  Medication Dose Route Frequency Provider Last Rate Last Dose   0.9 %  sodium chloride infusion  500 mL Intravenous Once Nandigam, Kavitha V, MD       0.9 %  sodium chloride infusion  500 mL Intravenous Once Nandigam, Kavitha V, MD         ROS Review of Systems General: negative for fever, weight loss, appetite change Eyes: no visual symptoms. ENT: no ear symptoms, no sinus tenderness, no nasal congestion or sore throat. Neck: no pain  Respiratory: no wheezing, shortness of breath, cough Cardiovascular: no chest pain, no dyspnea on exertion, no pedal edema, no orthopnea. Gastrointestinal: see hpi Genito-Urinary: no urinary frequency, no dysuria, no polyuria. Hematologic: no bruising Endocrine: no cold or heat intolerance Neurological: + headaches, no seizures, no tremors Musculoskeletal: no joint pains, no joint swelling Skin: no pruritus, no rash. Psychological: no depression, no anxiety,    Objective:  BP 105/64    Pulse 98    Temp 98.5 F (36.9 C) (Oral)    Ht 4\' 11"  (1.499 m)    Wt 120 lb (54.4 kg)    LMP 06/07/2015 (Exact Date)    SpO2 97%    BMI 24.24 kg/m   BP/Weight 02/19/2019 11/08/2018 [id number]  Systolic BP 123456 [id number] 99  Diastolic BP 64 64 64  Wt. (Lbs) 120 121.1 116  BMI 24.24 24.46 23.43      Physical Exam Constitutional: normal appearing,  Eyes: PERRLA HEENT: Head is atraumatic, normal sinuses, normal oropharynx, normal appearing tonsils and palate, tympanic membrane is normal bilaterally. Neck: normal range of motion, no thyromegaly, no JVD Cardiovascular: normal rate and rhythm, normal heart sounds, no murmurs, rub or gallop, no pedal edema Respiratory: Normal breath sounds, clear to auscultation bilaterally, no wheezes, no rales, no rhonchi Abdomen: soft, not tender to  palpation, normal bowel sounds, no enlarged organs Musculoskeletal: Full ROM, no tenderness in joints Skin: warm and dry, no lesions. Neurological: alert, oriented x3, cranial nerves I-XII grossly intact , normal motor strength, normal sensation. Psychological: normal mood.   CMP Latest Ref Rng & Units 02/19/2019 03/15/2018 12/14/2017  Glucose 65 - 99 mg/dL 102(H) 92 68  BUN 6 - 24 mg/dL 18 12 13   Creatinine 0.57 - 1.00 mg/dL 1.00 0.73 0.98  Sodium 134 - 144 mmol/L 141 139 140  Potassium 3.5 - 5.2 mmol/L 4.1 3.9 4.0  Chloride 96 - 106 mmol/L 104 104 104  CO2 20 - 29 mmol/L 22 21 22   Calcium 8.7 - 10.2 mg/dL 9.6 9.2 9.7  Total Protein 6.0 - 8.5 g/dL - 7.2 7.5  Total Bilirubin 0.0 - 1.2 mg/dL - 0.4 0.4  Alkaline Phos 39 - 117 IU/L - 66 81  AST 0 - 40 IU/L - 15 25  ALT 0 - 32 IU/L - 15 30    Lipid Panel     Component Value Date/Time   CHOL 181 11/03/2015 1017   TRIG 97 11/03/2015 1017   HDL 57 11/03/2015 1017   CHOLHDL 3.2 11/03/2015 1017   VLDL 19 11/03/2015 1017   LDLCALC 105 11/03/2015 1017    CBC    Component Value Date/Time   WBC 7.9 12/14/2017 1709   WBC 7.1 08/09/2017 0925   RBC 4.44 12/14/2017 1709   RBC 4.43 08/09/2017 0925   HGB 14.7 12/14/2017 1709   HGB 15.0 07/08/2015 1441   HCT 42.4 12/14/2017 1709   HCT 45.5 07/08/2015 1441   PLT 271 12/14/2017 1709   MCV 96 12/14/2017 1709   MCV 96.1 07/08/2015 1441   MCH 33.1 (H) 12/14/2017 1709   MCH 32.7 08/09/2017 0925   MCHC 34.7 12/14/2017 1709   MCHC 33.8 08/09/2017 0925   RDW 13.0 12/14/2017 1709   RDW 12.4 07/08/2015 1441   LYMPHSABS 1.7 12/14/2017 1709   LYMPHSABS 1.1 07/08/2015 1441   MONOABS 486 04/07/2016 1202   MONOABS 0.6 07/08/2015 1441   EOSABS 0.1 12/14/2017 1709   BASOSABS 0.0 12/14/2017 1709   BASOSABS 0.1 07/08/2015 1441    Lab Results  Component Value Date   HGBA1C 5.5 04/04/2015    Assessment & Plan:   1. Constipation, unspecified constipation type Increase fiber intake -  lactulose (CHRONULAC) 10 GM/15ML solution; Take 15 mLs (10 g total) by mouth 2 (two) times daily.  Dispense: 946 mL; Refill: 1 - Basic Metabolic Panel  2. Trigger finger of right thumb If symptoms persist will refer for cortisone injection - naproxen (NAPROSYN) 500 MG tablet; Take 1 tablet (500 mg total) by mouth 2 (two) times daily with a meal.  Dispense: 30 tablet; Refill: 1  3. Other headache syndrome Likely due to insomnia - butalbital-acetaminophen-caffeine (FIORICET) 50-325-40 MG tablet; Take 1 tablet by mouth every 12 (twelve) hours as needed for headache.  Dispense: 30 tablet; Refill: 1  4. Other insomnia Sleep hygiene discussed - traZODone (DESYREL) 50 MG tablet; Take 1 tablet (50 mg total) by mouth at bedtime.  Dispense: 30 tablet; Refill: 3  5. Lower abdominal pain Pain is chronic and work-up so far has been unrevealing We will treat constipation   Health Care Maintenance: vaginal Pap smear with GYN on 10/2018 -negative for malignancy Meds ordered this encounter  Medications   lactulose (CHRONULAC) 10 GM/15ML solution    Sig: Take 15 mLs (10 g total) by mouth 2 (two) times daily.    Dispense:  946 mL    Refill:  1   butalbital-acetaminophen-caffeine (FIORICET) 50-325-40 MG tablet    Sig: Take 1 tablet by mouth every 12 (twelve) hours as needed for headache.    Dispense:  30 tablet    Refill:  1   naproxen (NAPROSYN) 500 MG tablet    Sig: Take 1 tablet (500 mg total) by mouth 2 (two) times daily with a meal.    Dispense:  30 tablet    Refill:  1   traZODone (DESYREL) 50 MG tablet    Sig: Take 1 tablet (50 mg total) by mouth at bedtime.    Dispense:  30 tablet    Refill:  3    Follow-up: Return in about 3 months (around 05/21/2019) for medical conditions.       Charlott Rakes, MD, FAAFP. [facility]  [place], [place]   02/20/2019, 6:06 PM

## 2019-02-23 ENCOUNTER — Telehealth: Payer: Self-pay

## 2019-02-23 ENCOUNTER — Other Ambulatory Visit: Payer: Self-pay

## 2019-02-23 ENCOUNTER — Encounter: Payer: Self-pay | Admitting: Gynecologic Oncology

## 2019-02-23 ENCOUNTER — Inpatient Hospital Stay: Payer: Self-pay | Attending: Gynecologic Oncology | Admitting: Gynecologic Oncology

## 2019-02-23 VITALS — BP 110/58 | HR 88 | Temp 98.5°F | Resp 16 | Ht 62.0 in | Wt 118.4 lb

## 2019-02-23 DIAGNOSIS — K219 Gastro-esophageal reflux disease without esophagitis: Secondary | ICD-10-CM | POA: Insufficient documentation

## 2019-02-23 DIAGNOSIS — Z90722 Acquired absence of ovaries, bilateral: Secondary | ICD-10-CM | POA: Insufficient documentation

## 2019-02-23 DIAGNOSIS — C539 Malignant neoplasm of cervix uteri, unspecified: Secondary | ICD-10-CM | POA: Insufficient documentation

## 2019-02-23 DIAGNOSIS — Z90710 Acquired absence of both cervix and uterus: Secondary | ICD-10-CM | POA: Insufficient documentation

## 2019-02-23 DIAGNOSIS — Z79899 Other long term (current) drug therapy: Secondary | ICD-10-CM | POA: Insufficient documentation

## 2019-02-23 DIAGNOSIS — C538 Malignant neoplasm of overlapping sites of cervix uteri: Secondary | ICD-10-CM

## 2019-02-23 NOTE — Telephone Encounter (Signed)
-----   Message from Charlott Rakes, MD sent at 02/20/2019  1:37 PM EDT ----- Please inform the patient that labs are normal. Thank you.

## 2019-02-23 NOTE — Progress Notes (Signed)
Follow Up Note: Gyn-Onc  Crystal [name] 48 y.o. female  CC:  Chief Complaint  Patient presents with  . Cervical Cancer   Assessment/Plan:  48 year old s/p robotic-assisted type III radical laparoscopic hysterectomy with bilateral salpingectomy and bilateral sentinel lymph node biopsy on 07/01/15 for stage IB1 cervical SCC.  No residual carcinoma on hysterectomy specimen. Low risk features to her tumor therefore no adjuvant therapy recommended. No evidence for recurrence on today's exam.  1/ we will continue annual pap surveillance for this with hpv cotesting (next due in June, 2021).  2/ I discussed symptoms concerning for recurrence including vaginal bleeding or discharge, pelvic pain, cough, or lower extremity edema.  3/ Follow-up for surveillance 6 monthly until February, 2022.   4/ CT abdomen pelvis to evaluate the abdominal pain for possible recurrence. Low suspicion for this.    HPI: Crystal [name] is a 48 year old female initially referred by Dr. 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  She had her first abnormal Pap smear on 02/26/2015 which was HGSIL. She then underwent colposcopic evaluation of the cervix on 04/02/2015, which was benign in appearance. A biopsy from the ectocervix was benign and endocervical curetting showed CIN-3. As follow-up, she underwent a LEEP procedure on 05/12/2015. 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 Dimensions of the carcinoma were not included however there was a focus of dysplasia suspicious for invasive carcinoma involving the deep margin.  PET on 06/20/15: IMPRESSION: 1. No primary hypermetabolic primary cervical mass or evidence of nodal metastasis. 2. Focal hypermetabolism in the lateral left breast. No CT correlate. Consider correlation with diagnostic mammography and ultrasound to exclude otherwise occult breast  lesion. 3. Probable left nephrolithiasis  On 07/01/15, she underwent a robotic-assisted type III radical laparoscopic hysterectomy with bilateral salpingoophorectomy and bilateral pelvic lymphadenectomy by Dr. Denman George.  Final pathology revealed: Diagnosis 1. Uterus +/- tubes/ovaries, neoplastic, cervix CERVIX WITH LOW GRADE SQUAMOUS INTRAEPITHELIAL LESION, CIN-1 NO RESIDUAL HIGH GRADE INTRAEPITHELIAL LESION OR INVASIVE NEOPLASM IDENTIFIED VAGINAL CUFF, PARAMETRIAL AND ALL RESECTION MARGINS ARE NEGATIVE FOR TUMOR ENDOMETRIAL POLYP AND INACTIVE ENDOMETRIUM MYOMETRIUM: NO PATHOLOGICAL ALTERATIONS BILATERAL FALLOPIAN TUBES: HISTOLOGICAL UNREMARKABLE 2. Lymph node, sentinel, biopsy, right external iliac ONE BENIGN LYMPH NODE (0/1) 3. Lymph node, sentinel, biopsy, left obturator ONE BENIGN LYMPH NODE (0/1)  She was re-admitted to the hospital on POD 7, 07/08/15, for persistent nausea, vomiting, dehydration, no BM, dyspnea.  CT CAP on 07/08/15 failed to demonstrate evidence for intraperitoneal abscess/GU or GI injury or pathology including no obstruction. UA and culture revealed a catheter associated UTI (e coli - pan sensitive) which was treated with cipro. She was discharged home on 07/10/15 after IV hydration, aggressive bowel regimen.  She failed a voiding trial during her hospitalization and the foley had to be replaced due to urinary retention.  She presented to the office on 07/15/15 for a repeat voiding trial. She passed this well and her foley was left out, however she subsequently developed urinary retention and required teaching to intermittently self catheterize.  On 10/03/2015 she underwent a breast lumpectomy on the left which confirmed lobular hyperplasia but no carcinoma in situ identified. No additional therapy is required for this.  She was worked up for epigastric pain by GI in December, 2018 with an EGD which confirmed GERD.  She had CT abd/pelvis in March, 2019 for flank pain which  showed no evidence of recurrent cancer.  She reported right flank pain in October, 2019. CT abd/pelvis on 03/21/18 showed no evidence of recurrence.  There was an acquired right renal cyst measuring 9 mm that was simple in appearance and unchanged.  The right adnexa contained a 2.2 x 2.7 cm cyst.  No free fluid or signs of malignancy were identified.  Interval History:   She reports that she has had 2 weeks of mid abdominal, periumbilical, and lower pelvic abdominal pains.  She is not sure what these are associated with.  They concern her for symptoms she had at the time of diagnosis.  She denies vaginal bleeding.   Review of Systems  Constitutional: Feels well.  No fever, chills, early satiety, or unintentional weight loss or gain.  Cardiovascular: No chest pain, shortness of breath, or edema.  Pulmonary: coughs up blood Gastrointestinal: No nausea, vomiting, or diarrhea. No bright red blood per rectum or change in bowel movement.  Genitourinary: Minimal bladder sensation. No vaginal bleeding or discharge.  Musculoskeletal: No myalgia or joint pain. Neurologic: No weakness, numbness, or change in gait.  Psychology: No depression, anxiety, or insomnia.  Current Meds:  Outpatient Encounter Medications as of 02/23/2019  Medication Sig  . naproxen (NAPROSYN) 500 MG tablet Take 1 tablet (500 mg total) by mouth 2 (two) times daily with a meal.  . [DISCONTINUED] alum & mag hydroxide-simeth (MAALOX/MYLANTA) 200-200-20 MG/5ML suspension Take by mouth every 6 (six) hours as needed for indigestion or heartburn.  . [DISCONTINUED] bisacodyl (DULCOLAX) 5 MG EC tablet Take 5 mg by mouth daily as needed for moderate constipation.  . [DISCONTINUED] butalbital-acetaminophen-caffeine (FIORICET) 50-325-40 MG tablet Take 1 tablet by mouth every 12 (twelve) hours as needed for headache.  . [DISCONTINUED] lactulose (CHRONULAC) 10 GM/15ML solution Take 15 mLs (10 g total) by mouth 2 (two) times daily.  .  [DISCONTINUED] lubiprostone (AMITIZA) 24 MCG capsule Take 1 capsule (24 mcg total) by mouth 2 (two) times daily with a meal. (Patient not taking: Reported on 04/26/2018)  . [DISCONTINUED] omeprazole (PRILOSEC) 40 MG capsule Take 1 capsule (40 mg total) by mouth daily. (Patient not taking: Reported on 10/17/2018)  . [DISCONTINUED] tiZANidine (ZANAFLEX) 4 MG tablet Take 1 tablet (4 mg total) by mouth every 8 (eight) hours as needed for muscle spasms. (Patient not taking: Reported on 10/17/2018)  . [DISCONTINUED] traZODone (DESYREL) 50 MG tablet Take 1 tablet (50 mg total) by mouth at bedtime.   Facility-Administered Encounter Medications as of 02/23/2019  Medication  . 0.9 %  sodium chloride infusion  . 0.9 %  sodium chloride infusion    Allergy: No Known Allergies  Social Hx:   Social History   Socioeconomic History  . Marital status: Widowed    Spouse name: Not on file  . Number of children: 2  . Years of education: Not on file  . Highest education level: Not on file  Occupational History  . Not on file  Social Needs  . Financial resource strain: Not on file  . Food insecurity    Worry: Not on file    Inability: Not on file  . Transportation needs    Medical: Not on file    Non-medical: Not on file  Tobacco Use  . Smoking status: Never Smoker  . Smokeless tobacco: Never Used  Substance and Sexual Activity  . Alcohol use: Yes    Comment: occasionally  . Drug use: No  . Sexual activity: Not Currently    Birth control/protection: Surgical  Lifestyle  . Physical activity  Days per week: Not on file    Minutes per session: Not on file  . Stress: Not on file  Relationships  . Social Herbalist on phone: Not on file    Gets together: Not on file    Attends religious service: Not on file    Active member of club or organization: Not on file    Attends meetings of clubs or organizations: Not on file    Relationship status: Not on file  . Intimate partner violence     Fear of current or ex partner: Not on file    Emotionally abused: Not on file    Physically abused: Not on file    Forced sexual activity: Not on file  Other Topics Concern  . Not on file  Social History Narrative  . Not on file    Past Surgical Hx:  Past Surgical History:  Procedure Laterality Date  . BREAST BIOPSY Left 08/28/2015   MRI- High Risk  . BREAST BIOPSY Right 08/28/2015   MRI- Benign  . BREAST EXCISIONAL BIOPSY Left 10/03/2015  . BREAST LUMPECTOMY WITH RADIOACTIVE SEED LOCALIZATION Left 10/03/2015   Procedure: LEFT BREAST LUMPECTOMY WITH RADIOACTIVE SEED LOCALIZATION;  Surgeon: Fanny Skates, MD;  Location: [facility];  Service: General;  Laterality: Left;  . CERVICAL BIOPSY  W/ LOOP ELECTRODE EXCISION     12'16  . CESAREAN SECTION     2 previous  . ROBOTIC ASSISTED TOTAL HYSTERECTOMY Bilateral 07/01/2015   Procedure: XI ROBOTIC ASSISTED RADICAL TYPE II HYSTERECTOMY, BILATERAL SALPINGECTOMY, SENTINAL LYMPH NODE BIOPSY;  Surgeon: Everitt Amber, MD;  Location: [facility];  Service: Gynecology;  Laterality: Bilateral;    Past Medical Hx:  Past Medical History:  Diagnosis Date  . Allergy   . Cancer ([place])    cervical cancer dx.   . History of kidney stones    x1   . Kidney stones    cyst on kidney -no problemsm hx of kidney stones  . 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 PONV (postoperative nausea and vomiting)     Family Hx:  Family History  Problem Relation Age of Onset  . Hypertension Sister   . Colon cancer Neg Hx   . Esophageal cancer Neg Hx   . Rectal cancer Neg Hx   . Stomach cancer Neg Hx     Vitals:  Blood pressure (!) 110/58, pulse 88, temperature 98.5 F (36.9 C), temperature source Temporal, resp. rate 16, height 5\' 2"  (1.575 m), weight 118 lb 7 oz (53.7 kg), last menstrual period 06/07/2015, SpO2 99 %.  Physical Exam:  General: Well developed, well nourished female in no acute distress. Alert  and oriented x 3.  Cardiovascular: Regular rate and rhythm. S1 and S2 normal.  Lungs: Clear to auscultation bilaterally. No wheezes/crackles/rhonchi noted.  Skin: No rashes or lesions present. Back: No CVA tenderness.  Abdomen: Abdomen soft, non-tender and non-obese. Active bowel sounds in all quadrants. No evidence of a fluid wave or abdominal masses.  Lap sites soft. Extremities: No bilateral cyanosis, edema, or clubbing.  Pelvic exam: No residual granulation tissue at cuff. No nodulariy or lesions.  Rectal: smooth, no nodularity or masses   Thereasa Solo, MD  02/23/2019, 4:51 PM

## 2019-02-23 NOTE — Telephone Encounter (Signed)
Patient name and DOB has been verified Patient was informed of lab results. Patient had no questions.  

## 2019-02-23 NOTE — Patient Instructions (Signed)
Dr Denman George does not see any evidence of cancer recurrence on examination, however, she has ordered a CT scan to better look at the inside.  If it is normal, she will see you again in 6 months.  Please contact Dr Serita Grit office (at [phone]) in January, 2021 to request an appointment with her for April, 2021.

## 2019-02-26 ENCOUNTER — Telehealth: Payer: Self-pay | Admitting: *Deleted

## 2019-02-26 NOTE — Telephone Encounter (Signed)
Called and spoke with the patient's daughter, gave the information for the CT scan. Gave the date/time and instructions. Explained that the contrast will be at the front desk

## 2019-03-02 ENCOUNTER — Other Ambulatory Visit: Payer: Self-pay

## 2019-03-02 ENCOUNTER — Ambulatory Visit (HOSPITAL_COMMUNITY)
Admission: RE | Admit: 2019-03-02 | Discharge: 2019-03-02 | Disposition: A | Discharge status: Home / Self Care | Payer: Self-pay | Source: Ambulatory Visit | Attending: Gynecologic Oncology | Admitting: Gynecologic Oncology

## 2019-03-02 ENCOUNTER — Encounter (HOSPITAL_COMMUNITY): Payer: Self-pay

## 2019-03-02 DIAGNOSIS — C538 Malignant neoplasm of overlapping sites of cervix uteri: Secondary | ICD-10-CM | POA: Insufficient documentation

## 2019-03-02 MED ORDER — SODIUM CHLORIDE (PF) 0.9 % IJ SOLN
INTRAMUSCULAR | Status: AC
  Filled 2019-03-02: qty 50

## 2019-03-02 MED ORDER — DIPHENHYDRAMINE HCL 25 MG PO CAPS
ORAL_CAPSULE | ORAL | Status: AC
  Filled 2019-03-02: qty 1

## 2019-03-02 MED ORDER — IOHEXOL 300 MG/ML  SOLN
100.0000 mL | Freq: Once | INTRAMUSCULAR | Status: AC | PRN
  Administered 2019-03-02: 14:00:00 100 mL via INTRAVENOUS

## 2019-03-05 ENCOUNTER — Telehealth: Payer: Self-pay

## 2019-03-05 NOTE — Telephone Encounter (Signed)
Told daughter that the scan did not show any cancer recurrence No cause for the abdominal pain was identified. She has 2 small nonobstructive left kidney stones. A cyst in the right kidney upper pole per Joylene John, NP. Daughter verbalized understanding.

## 2019-07-16 ENCOUNTER — Telehealth: Payer: Self-pay | Admitting: *Deleted

## 2019-07-16 NOTE — Telephone Encounter (Signed)
Patient's daughter called and scheduled a follow up appt for the patient

## 2019-07-31 ENCOUNTER — Ambulatory Visit: Payer: Self-pay | Admitting: Family

## 2019-08-08 ENCOUNTER — Ambulatory Visit: Payer: Self-pay | Attending: Gynecologic Oncology | Admitting: Physician Assistant

## 2019-08-08 ENCOUNTER — Other Ambulatory Visit: Payer: Self-pay

## 2019-08-08 VITALS — BP 112/70 | HR 100 | Temp 98.6°F | Ht 62.0 in | Wt 118.2 lb

## 2019-08-08 DIAGNOSIS — R109 Unspecified abdominal pain: Secondary | ICD-10-CM

## 2019-08-08 DIAGNOSIS — Z789 Other specified health status: Secondary | ICD-10-CM

## 2019-08-08 DIAGNOSIS — R102 Pelvic and perineal pain: Secondary | ICD-10-CM

## 2019-08-08 DIAGNOSIS — R110 Nausea: Secondary | ICD-10-CM

## 2019-08-08 DIAGNOSIS — K5900 Constipation, unspecified: Secondary | ICD-10-CM

## 2019-08-08 LAB — POCT URINALYSIS DIP (CLINITEK)
Bilirubin, UA: NEGATIVE
Blood, UA: NEGATIVE
Glucose, UA: NEGATIVE mg/dL
Ketones, POC UA: NEGATIVE mg/dL
Leukocytes, UA: NEGATIVE
Nitrite, UA: NEGATIVE
POC PROTEIN,UA: NEGATIVE
Spec Grav, UA: 1.015 (ref 1.010–1.025)
Urobilinogen, UA: 0.2 E.U./dL
pH, UA: 5.5 (ref 5.0–8.0)

## 2019-08-08 MED ORDER — ONDANSETRON HCL 8 MG PO TABS: 8.0000 mg | ORAL_TABLET | Freq: Three times a day (TID) | ORAL | 0 refills | Status: DC | PRN

## 2019-08-08 MED ORDER — POLYETHYLENE GLYCOL 3350 17 GM/SCOOP PO POWD: 17.0000 g | Freq: Two times a day (BID) | ORAL | 1 refills | Status: DC | PRN

## 2019-08-08 MED FILL — ONDANSETRON HCL 8 MG TABLET: 8 | 6 days supply | Qty: 20 | Fill #0

## 2019-08-08 MED FILL — POLYETHYLENE GLYCOL 3350 PO: 17 | 7 days supply | Qty: 238 | Fill #0

## 2019-08-08 NOTE — Progress Notes (Signed)
Lower abd pain on the left side and radiates to right and pelvic region

## 2019-08-08 NOTE — Patient Instructions (Addendum)
Dolor plvico en la mujer Pelvic Pain, Female El dolor plvico se siente en la parte inferior del vientre (abdomen), debajo del ombligo y a nivel de las caderas. El dolor puede comenzar en forma repentina (ser [employer]), reaparecer (ser recurrente) o durar mucho tiempo (volverse crnico). El dolor plvico que dura ms de 6 meses se denomina dolor plvico crnico. El dolor plvico puede tener muchas causas. A veces, la causa del dolor plvico no se conoce. Siga estas indicaciones en su casa:   Tome los medicamentos de venta libre y los recetados solamente como se lo haya indicado el mdico.  Haga reposo como se lo haya indicado el mdico.  No tenga relaciones sexuales si siente dolor.  Lleve un registro del dolor plvico. Escriba los siguientes datos: ? Cundo Energy manager. ? La ubicacin del dolor. ? Qu parece mejorar o empeorar el dolor, como alimentos o el perodo (ciclo menstrual). ? Cualquier sntoma que se presente junto con Conservation officer, historic buildings.  Concurra a todas las visitas de control como se lo haya indicado el mdico. Esto es importante. Comunquese con un mdico si:  Los medicamentos no Forensic psychologist.  El dolor regresa.  Aparecen nuevos sntomas.  Tiene sangrado o secrecin inusual de la vagina.  Tiene fiebre o escalofros.  Tiene dificultad para defecar (estreimiento).  Observa sangre en el pis (orina) o en la materia fecal (heces).  El pis tiene mal olor.  Se siente dbil o siente que va a desvanecerse. Solicite ayuda inmediatamente si:  Technical brewer repentino y Wellsite geologist.  El dolor es cada Producer, television/film/video.  Siente un dolor muy intenso y tambin tiene alguno de estos sntomas: ? Cristy Hilts. ? Malestar estomacal (nuseas). ? Vmitos. ? Tiene mucho sudor.  Se desmaya (pierde el conocimiento). Resumen  El dolor plvico se siente en la parte inferior del vientre (abdomen), debajo del ombligo y a nivel de las caderas.  Hay muchas causas posibles del dolor  plvico.  Lleve un registro del dolor plvico. Esta informacin no tiene Marine scientist el consejo del mdico. Asegrese de hacerle al mdico cualquier pregunta que tenga. Document Revised: 12/29/2017 Document Reviewed: 12/29/2017 Elsevier Patient Education  [place] en los adultos Constipation, Adult Se llama estreimiento cuando:  Tiene deposiciones (defeca) una menor cantidad de veces a la semana de lo normal.  Tiene dificultad para defecar.  Las heces son secas y duras o son ms grandes que lo normal. Siga estas indicaciones en su casa: Comida y bebida   Consuma alimentos con alto contenido de [place], por ejemplo: ? Lambert Mody y verduras frescas. ? Cereales integrales. ? Frijoles.  Consuma una menor cantidad de alimentos ricos en grasas, con bajo contenido de [place] o excesivamente procesados, como: ? Papas fritas. ? Hamburguesas. ? Galletas. ? Caramelos. ? Gaseosas.  Beba suficiente lquido para mantener el pis (orina) claro o de color amarillo plido. Instrucciones generales  Haga actividad fsica con regularidad o segn las indicaciones del mdico.  Vaya al bao cuando sienta la necesidad de defecar. No se aguante las ganas.  Tome los medicamentos de venta libre y los recetados solamente como se lo haya indicado el mdico. Estos incluyen los suplementos de [place].  Realice ejercicios de reentrenamiento del suelo plvico, como: ? Respirar profundamente mientras relaja la parte inferior del vientre (abdomen). ? Relajar el suelo plvico mientras defeca.  Controle su afeccin para ver si hay cambios.  Concurra a todas las visitas de control como se lo haya  indicado el mdico. Esto es importante. Comunquese con un mdico si:  Siente un dolor que empeora.  Tiene fiebre.  No ha defecado por 4das.  Vomita.  No tiene hambre.  Pierde peso.  Tiene una hemorragia en el ano.  Las deposiciones Media planner) son delgadas como un  lpiz. Solicite ayuda de inmediato si:  Jaclynn Guarneri, y los sntomas empeoran de repente.  Tiene prdida de materia fecal u observa [employer].  Siente el vientre ms duro o ms grande de lo normal (est hinchado).  Siente un dolor muy intenso en el vientre.  Se siente mareado o se desmaya. Esta informacin no tiene Marine scientist el consejo del mdico. Asegrese de hacerle al mdico cualquier pregunta que tenga. Document Revised: 08/11/2016 Document Reviewed: 10/29/2015 Elsevier Patient Education  [place].

## 2019-08-08 NOTE — Progress Notes (Signed)
Crystal [name], is a 49 y.o. female  [id number]  [id number]  DOB - [birth year]  Subjective:  Chief Complaint and HPI: Crystal [name] is a 49 y.o. female here today for pelvic pain that radiates into the L hip and across the lower back.  It is worse with standing and bending over and at night.  Advil helps but causes her reflux to act up.  Pain comes and goes.  Some nausea; no vomiting.  +constipation.  No diarrhea.  No fever/chills.  No urinary s/sx.  S/p hysterectomy (years ago)but still has ovaries.  No vaginal discharge.    ROS:   Constitutional:  No f/c, No night sweats, No unexplained weight loss. EENT:  No vision changes, No blurry vision, No hearing changes. No mouth, throat, or ear problems.  Respiratory: No cough, No SOB Cardiac: No CP, no palpitations GI:  + abd pain, No N/V/D. GU: No Urinary s/sx Musculoskeletal: No joint pain Neuro: No headache, no dizziness, no motor weakness.  Skin: No rash Endocrine:  No polydipsia. No polyuria.  Psych: Denies SI/HI  No problems updated.  ALLERGIES: Allergies  Allergen Reactions  . Contrast Media [Iodinated Diagnostic Agents] Itching    13 hr prep in the future; itching of tongue and around the mouth 03/02/2019    PAST MEDICAL HISTORY: Past Medical History:  Diagnosis Date  . Allergy   . Cancer ([place])    cervical cancer dx.   . History of kidney stones    x1   . Kidney stones    cyst on kidney -no problemsm hx of kidney stones  . 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 PONV (postoperative nausea and vomiting)     MEDICATIONS AT HOME: Prior to Admission medications   Medication Sig Start Date End Date Taking? Authorizing Provider  Naproxen Sodium (ALEVE PO) Take 400 mg by mouth every 8 (eight) hours as needed.   Yes [provider]  naproxen (NAPROSYN) 500 MG tablet Take 1 tablet (500 mg total) by mouth 2 (two) times daily with a meal. Patient not taking:  Reported on 08/08/2019 02/19/19   Charlott Rakes, MD  ondansetron (ZOFRAN) 8 MG tablet Take 1 tablet (8 mg total) by mouth every 8 (eight) hours as needed for nausea or vomiting. 08/08/19   Argentina Donovan, PA-C  polyethylene glycol powder (GLYCOLAX/MIRALAX) 17 GM/SCOOP powder Take 17 g by mouth 2 (two) times daily as needed. 08/08/19   Argentina Donovan, PA-C     Objective:  EXAM:   Vitals:   08/08/19 1617  BP: 112/70  Pulse: 100  Temp: 98.6 F (37 C)  SpO2: 95%  Weight: 118 lb 3.2 oz (53.6 kg)  Height: 5\' 2"  (1.575 m)    General appearance : A&OX3. NAD. Non-toxic-appearing HEENT: Atraumatic and Normocephalic.  PERRLA. EOM intact.  Neck: supple, no JVD. No cervical lymphadenopathy. No thyromegaly Chest/Lungs:  Breathing-non-labored, Good air entry bilaterally, breath sounds normal without rales, rhonchi, or wheezing  CVS: S1 S2 regular, no murmurs, gallops, rubs  Abdomen: Bowel sounds present, Non tender and not distended with no gaurding, rigidity or rebound. Extremities: Bilateral Lower Ext shows no edema, both legs are warm to touch with = pulse throughout Neurology:  CN II-XII grossly intact, Non focal.   Psych:  TP linear. J/I WNL. Normal speech. Appropriate eye contact and affect.  Skin:  No Rash  Data Review Lab Results  Component Value Date   HGBA1C 5.5 04/04/2015  Assessment & Plan   1. Stomach pain No red flags/non-acute abdomen - POCT URINALYSIS DIP (CLINITEK) - CBC with Differential/Platelet  2. Nausea - ondansetron (ZOFRAN) 8 MG tablet; Take 1 tablet (8 mg total) by mouth every 8 (eight) hours as needed for nausea or vomiting.  Dispense: 20 tablet; Refill: 0 - Comprehensive metabolic panel - CBC with Differential/Platelet  3. Pelvic pain - US Pelvic Complete With Transvaginal; Future - Comprehensive metabolic panel - CBC with Differential/Platelet  4. Constipation, unspecified constipation type - polyethylene glycol powder (GLYCOLAX/MIRALAX) 17  GM/SCOOP powder; Take 17 g by mouth 2 (two) times daily as needed.  Dispense: 3350 g; Refill: 1  5. Language barrier stratus interpreters used and additional time performing visit was required.    Patient have been counseled extensively about nutrition and exercise  Return if symptoms worsen or fail to improve.  The patient was given clear instructions to go to ER or return to medical center if symptoms don't improve, worsen or new problems develop. The patient verbalized understanding. The patient was told to call to get lab results if they haven't heard anything in the next week.     Freeman Caldron, PA-C [facility] and [place], [place]   08/08/2019, 4:41 PM

## 2019-08-09 LAB — CBC WITH DIFFERENTIAL/PLATELET
Basophils Absolute: 0 10*3/uL (ref 0.0–0.2)
Basos: 0 %
EOS (ABSOLUTE): 0.2 10*3/uL (ref 0.0–0.4)
Eos: 2 %
Hematocrit: 44.6 % (ref 34.0–46.6)
Hemoglobin: 15.5 g/dL (ref 11.1–15.9)
Immature Grans (Abs): 0 10*3/uL (ref 0.0–0.1)
Immature Granulocytes: 0 %
Lymphocytes Absolute: 2 10*3/uL (ref 0.7–3.1)
Lymphs: 23 %
MCH: 33.2 pg — ABNORMAL HIGH (ref 26.6–33.0)
MCHC: 34.8 g/dL (ref 31.5–35.7)
MCV: 96 fL (ref 79–97)
Monocytes Absolute: 0.6 10*3/uL (ref 0.1–0.9)
Monocytes: 7 %
Neutrophils Absolute: 6 10*3/uL (ref 1.4–7.0)
Neutrophils: 68 %
Platelets: 257 10*3/uL (ref 150–450)
RBC: 4.67 x10E6/uL (ref 3.77–5.28)
RDW: 11.9 % (ref 11.7–15.4)
WBC: 8.8 10*3/uL (ref 3.4–10.8)

## 2019-08-09 LAB — COMPREHENSIVE METABOLIC PANEL
ALT: 24 IU/L (ref 0–32)
AST: 18 IU/L (ref 0–40)
Albumin/Globulin Ratio: 1.4 (ref 1.2–2.2)
Albumin: 4.6 g/dL (ref 3.8–4.8)
Alkaline Phosphatase: 76 IU/L (ref 39–117)
BUN/Creatinine Ratio: 12 (ref 9–23)
BUN: 11 mg/dL (ref 6–24)
Bilirubin Total: 0.5 mg/dL (ref 0.0–1.2)
CO2: 22 mmol/L (ref 20–29)
Calcium: 9.7 mg/dL (ref 8.7–10.2)
Chloride: 105 mmol/L (ref 96–106)
Creatinine, Ser: 0.95 mg/dL (ref 0.57–1.00)
GFR calc Af Amer: 81 mL/min/{1.73_m2} (ref >59)
GFR calc non Af Amer: 70 mL/min/{1.73_m2} (ref >59)
Globulin, Total: 3.2 g/dL (ref 1.5–4.5)
Glucose: 102 mg/dL — ABNORMAL HIGH (ref 65–99)
Potassium: 4 mmol/L (ref 3.5–5.2)
Sodium: 139 mmol/L (ref 134–144)
Total Protein: 7.8 g/dL (ref 6.0–8.5)

## 2019-08-13 ENCOUNTER — Ambulatory Visit: Payer: Self-pay

## 2019-08-13 ENCOUNTER — Other Ambulatory Visit: Payer: Self-pay

## 2019-08-15 ENCOUNTER — Encounter: Payer: Self-pay | Admitting: *Deleted

## 2019-08-15 ENCOUNTER — Other Ambulatory Visit: Payer: Self-pay

## 2019-08-15 ENCOUNTER — Ambulatory Visit (HOSPITAL_COMMUNITY)
Admission: RE | Admit: 2019-08-15 | Discharge: 2019-08-15 | Disposition: A | Discharge status: Home / Self Care | Payer: Self-pay | Source: Ambulatory Visit | Attending: Physician Assistant | Admitting: Physician Assistant

## 2019-08-15 DIAGNOSIS — R102 Pelvic and perineal pain: Secondary | ICD-10-CM | POA: Insufficient documentation

## 2019-09-07 ENCOUNTER — Encounter: Payer: Self-pay | Admitting: Gynecologic Oncology

## 2019-09-07 ENCOUNTER — Other Ambulatory Visit: Payer: Self-pay

## 2019-09-07 ENCOUNTER — Inpatient Hospital Stay: Payer: Self-pay | Attending: Gynecologic Oncology | Admitting: Gynecologic Oncology

## 2019-09-07 VITALS — BP 117/61 | HR 52 | Temp 99.1°F | Resp 17 | Ht 62.0 in | Wt 120.0 lb

## 2019-09-07 DIAGNOSIS — Z90710 Acquired absence of both cervix and uterus: Secondary | ICD-10-CM | POA: Insufficient documentation

## 2019-09-07 DIAGNOSIS — Z90722 Acquired absence of ovaries, bilateral: Secondary | ICD-10-CM | POA: Insufficient documentation

## 2019-09-07 DIAGNOSIS — C539 Malignant neoplasm of cervix uteri, unspecified: Secondary | ICD-10-CM | POA: Insufficient documentation

## 2019-09-07 DIAGNOSIS — R109 Unspecified abdominal pain: Secondary | ICD-10-CM | POA: Insufficient documentation

## 2019-09-07 NOTE — Patient Instructions (Signed)
You have a normal ultrasound and examination. Dr Denman George does not think there is any recurrence of the cancer. She is ordering a scan to evaluate your pain.   Please contact Dr Serita Grit office (at [phone]) in July to request an appointment with her for October, 2021.  Dr Denman George will follow-up with you until April, 2022. At that time, if there is no evidence of recurrence, your cancer surveillance will be discontinued.

## 2019-09-07 NOTE — Progress Notes (Signed)
Follow Up Note: Gyn-Onc  [name] 49 y.o. female  CC:  Chief Complaint  Patient presents with  . Cervical Cancer    follow-up   Assessment/Plan:  50 year old s/p robotic-assisted type III radical laparoscopic hysterectomy with bilateral salpingectomy and bilateral sentinel lymph node biopsy on 07/01/15 for stage IB1 cervical SCC.  No residual carcinoma on hysterectomy specimen. Low risk features to her tumor therefore no adjuvant therapy recommended. No evidence for recurrence on today's exam.  1/ we will continue annual pap surveillance for this with hpv cotesting (next due in October, 2021).  2/ I discussed symptoms concerning for recurrence including vaginal bleeding or discharge, pelvic pain, cough, or lower extremity edema.  3/ Follow-up for surveillance 6 monthly until February, 2022.   4/ CT abdomen pelvis to evaluate the abdominal pain for possible recurrence. Low suspicion for this. Noncontrasted due to allergy and my low suspicion for malignancy and normal [place]. She seems to have chronic abdominal pains and frequently reports pains desiring scans. These have all been normal.   HPI: Crystal [name] is a 49 year old female initially referred by Dr. 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  She had her first abnormal Pap smear on 02/26/2015 which was HGSIL. She then underwent colposcopic evaluation of the cervix on 04/02/2015, which was benign in appearance. A biopsy from the ectocervix was benign and endocervical curetting showed CIN-3. As follow-up, she underwent a LEEP procedure on 05/12/2015. 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 Dimensions of the carcinoma were not included however there was a focus of dysplasia suspicious for invasive carcinoma involving the deep margin.  PET on 06/20/15: IMPRESSION: 1. No primary hypermetabolic primary cervical mass or  evidence of nodal metastasis. 2. Focal hypermetabolism in the lateral left breast. No CT correlate. Consider correlation with diagnostic mammography and ultrasound to exclude otherwise occult breast lesion. 3. Probable left nephrolithiasis  On 07/01/15, she underwent a robotic-assisted type III radical laparoscopic hysterectomy with bilateral salpingoophorectomy and bilateral pelvic lymphadenectomy by Dr. Denman George.  Final pathology revealed: Diagnosis 1. Uterus +/- tubes/ovaries, neoplastic, cervix CERVIX WITH LOW GRADE SQUAMOUS INTRAEPITHELIAL LESION, CIN-1 NO RESIDUAL HIGH GRADE INTRAEPITHELIAL LESION OR INVASIVE NEOPLASM IDENTIFIED VAGINAL CUFF, PARAMETRIAL AND ALL RESECTION MARGINS ARE NEGATIVE FOR TUMOR ENDOMETRIAL POLYP AND INACTIVE ENDOMETRIUM MYOMETRIUM: NO PATHOLOGICAL ALTERATIONS BILATERAL FALLOPIAN TUBES: HISTOLOGICAL UNREMARKABLE 2. Lymph node, sentinel, biopsy, right external iliac ONE BENIGN LYMPH NODE (0/1) 3. Lymph node, sentinel, biopsy, left obturator ONE BENIGN LYMPH NODE (0/1)  She was re-admitted to the hospital on POD 7, 07/08/15, for persistent nausea, vomiting, dehydration, no BM, dyspnea.  CT CAP on 07/08/15 failed to demonstrate evidence for intraperitoneal abscess/GU or GI injury or pathology including no obstruction. UA and culture revealed a catheter associated UTI (e coli - pan sensitive) which was treated with cipro. She was discharged home on 07/10/15 after IV hydration, aggressive bowel regimen.  She failed a voiding trial during her hospitalization and the foley had to be replaced due to urinary retention.  She presented to the office on 07/15/15 for a repeat voiding trial. She passed this well and her foley was left out, however she subsequently developed urinary retention and required teaching to intermittently self catheterize.  On 10/03/2015 she underwent a breast lumpectomy on the left which confirmed lobular hyperplasia but no carcinoma in situ identified. No  additional therapy is required for this.  She was  worked up for epigastric pain by GI in December, 2018 with an EGD which confirmed GERD.  She had CT abd/pelvis in March, 2019 for flank pain which showed no evidence of recurrent cancer.   She reported right flank pain in October, 2019. CT abd/pelvis on 03/21/18 showed no evidence of recurrence.  There was an acquired right renal cyst measuring 9 mm that was simple in appearance and unchanged.  The right adnexa contained a 2.2 x 2.7 cm cyst.  No free fluid or signs of malignancy were identified.  When she was seen in October, 2020 she reports that she has had 2 weeks of mid abdominal, periumbilical, and lower pelvic abdominal pains.  A CT abd/pelvis on March 02, 2019 revealed no evidence of malignancy and the course of the patient's abdominal pain was not identified.  Interval History:   She reported a 41-month history of pelvic pain and March 2021 and a transvaginal ultrasound scan was ordered and performed on August 15, 2019.  This revealed a surgically absent uterus, a right ovary measuring 2.5 x 1.4 x 1.9 cm with a dominant follicular cyst without a mass.  The left ovary was not visualized and likely obscured by bowel.  There is no free fluid or masses noted on ultrasound.  The patient reported that her pain was intermittent she reported that it was both sides of the paralumbar area intermittently in the left groin intermittently in the left lower quadrant.  She was not able to articulate exacerbating or relieving factors.  She states that it makes her very anxious.  Review of Systems  Constitutional: Feels well.  No fever, chills, early satiety, or unintentional weight loss or gain.  Cardiovascular: No chest pain, shortness of breath, or edema.  Pulmonary: coughs up blood Gastrointestinal: No nausea, vomiting, or diarrhea. No bright red blood per rectum or change in bowel movement. + abdominal pains Genitourinary: Minimal bladder sensation. No  vaginal bleeding or discharge.  Musculoskeletal: No myalgia or joint pain. Neurologic: No weakness, numbness, or change in gait.  Psychology: No depression, anxiety, or insomnia.  Current Meds:  Outpatient Encounter Medications as of 09/07/2019  Medication Sig  . [DISCONTINUED] naproxen (NAPROSYN) 500 MG tablet Take 1 tablet (500 mg total) by mouth 2 (two) times daily with a meal. (Patient not taking: Reported on 08/08/2019)  . [DISCONTINUED] Naproxen Sodium (ALEVE PO) Take 400 mg by mouth every 8 (eight) hours as needed.  . [DISCONTINUED] ondansetron (ZOFRAN) 8 MG tablet Take 1 tablet (8 mg total) by mouth every 8 (eight) hours as needed for nausea or vomiting. (Patient not taking: Reported on 09/07/2019)  . [DISCONTINUED] polyethylene glycol powder (GLYCOLAX/MIRALAX) 17 GM/SCOOP powder Take 17 g by mouth 2 (two) times daily as needed. (Patient not taking: Reported on 09/07/2019)   Facility-Administered Encounter Medications as of 09/07/2019  Medication  . 0.9 %  sodium chloride infusion  . 0.9 %  sodium chloride infusion    Allergy:  Allergies  Allergen Reactions  . Contrast Media [Iodinated Diagnostic Agents] Itching    13 hr prep in the future; itching of tongue and around the mouth 03/02/2019    Social Hx:   Social History   Socioeconomic History  . Marital status: Widowed    Spouse name: Not on file  . Number of children: 2  . Years of education: Not on file  . Highest education level: Not on file  Occupational History  . Not on file  Tobacco Use  . Smoking status: Never Smoker  .  Smokeless tobacco: Never Used  Substance and Sexual Activity  . Alcohol use: Yes    Comment: occasionally  . Drug use: No  . Sexual activity: Not Currently    Birth control/protection: Surgical  Other Topics Concern  . Not on file  Social History Narrative  . Not on file   Social Determinants of Health   Financial Resource Strain:   . Difficulty of Paying Living Expenses:   Food  Insecurity:   . Worried About Charity fundraiser in the Last Year:   . Arboriculturist in the Last Year:   Transportation Needs:   . Film/video editor (Medical):   Marland Kitchen Lack of Transportation (Non-Medical):   Physical Activity:   . Days of Exercise per Week:   . Minutes of Exercise per Session:   Stress:   . Feeling of Stress :   Social Connections:   . Frequency of Communication with Friends and Family:   . Frequency of Social Gatherings with Friends and Family:   . Attends Religious Services:   . Active Member of Clubs or Organizations:   . Attends Archivist Meetings:   Marland Kitchen Marital Status:   Intimate Partner Violence:   . Fear of Current or Ex-Partner:   . Emotionally Abused:   Marland Kitchen Physically Abused:   . Sexually Abused:     Past Surgical Hx:  Past Surgical History:  Procedure Laterality Date  . BREAST BIOPSY Left 08/28/2015   MRI- High Risk  . BREAST BIOPSY Right 08/28/2015   MRI- Benign  . BREAST EXCISIONAL BIOPSY Left 10/03/2015  . BREAST LUMPECTOMY WITH RADIOACTIVE SEED LOCALIZATION Left 10/03/2015   Procedure: LEFT BREAST LUMPECTOMY WITH RADIOACTIVE SEED LOCALIZATION;  Surgeon: Fanny Skates, MD;  Location: [place];  Service: General;  Laterality: Left;  . CERVICAL BIOPSY  W/ LOOP ELECTRODE EXCISION     12'16  . CESAREAN SECTION     2 previous  . ROBOTIC ASSISTED TOTAL HYSTERECTOMY Bilateral 07/01/2015   Procedure: XI ROBOTIC ASSISTED RADICAL TYPE II HYSTERECTOMY, BILATERAL SALPINGECTOMY, SENTINAL LYMPH NODE BIOPSY;  Surgeon: Everitt Amber, MD;  Location: [facility];  Service: Gynecology;  Laterality: Bilateral;    Past Medical Hx:  Past Medical History:  Diagnosis Date  . Allergy   . Cancer ([place])    cervical cancer dx.   . History of kidney stones    x1   . Kidney stones    cyst on kidney -no problemsm hx of kidney stones  . 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 PONV (postoperative nausea and  vomiting)     Family Hx:  Family History  Problem Relation Age of Onset  . Hypertension Sister   . Colon cancer Neg Hx   . Esophageal cancer Neg Hx   . Rectal cancer Neg Hx   . Stomach cancer Neg Hx     Vitals:  Blood pressure 117/61, pulse (!) 52, temperature 99.1 F (37.3 C), temperature source Temporal, resp. rate 17, height 5\' 2"  (1.575 m), weight 120 lb (54.4 kg), last menstrual period 06/07/2015, SpO2 100 %.  Physical Exam:  General: Well developed, well nourished female in no acute distress. Alert and oriented x 3.  Cardiovascular: Regular rate and rhythm. S1 and S2 normal.  Lungs: Clear to auscultation bilaterally. No wheezes/crackles/rhonchi noted.  Skin: No rashes or lesions present. Back: No CVA tenderness.  Abdomen: Abdomen soft,  and non-obese. Active bowel sounds in  all quadrants. No evidence of a fluid wave or abdominal masses.  Lap sites soft. Left lower quadrant tenderness to deep palpation. Extremities: No bilateral cyanosis, edema, or clubbing.  Pelvic exam: No residual granulation tissue at cuff. No nodulariy or lesions.  Rectal: smooth, no nodularity or masses   Thereasa Solo, MD  09/07/2019, 4:37 PM

## 2019-09-14 ENCOUNTER — Other Ambulatory Visit: Payer: Self-pay

## 2019-09-14 ENCOUNTER — Ambulatory Visit (HOSPITAL_COMMUNITY)
Admission: RE | Admit: 2019-09-14 | Discharge: 2019-09-14 | Disposition: A | Discharge status: Home / Self Care | Payer: Self-pay | Source: Ambulatory Visit | Attending: Gynecologic Oncology | Admitting: Gynecologic Oncology

## 2019-09-14 ENCOUNTER — Encounter (HOSPITAL_COMMUNITY): Payer: Self-pay

## 2019-09-14 DIAGNOSIS — C539 Malignant neoplasm of cervix uteri, unspecified: Secondary | ICD-10-CM | POA: Insufficient documentation

## 2019-09-18 ENCOUNTER — Telehealth: Payer: Self-pay | Admitting: *Deleted

## 2019-09-18 NOTE — Telephone Encounter (Signed)
Spoke with Crystal [name] regarding the results of her CT. Due to language barrier, Pt requests that her daughter call back later today to clarify the results.

## 2019-09-18 NOTE — Telephone Encounter (Signed)
Pt's daughter called to clarify the results of her mother's CT. She was notified that there is no evidence of recurrence and that bilateral kidneys stones were seen. Pt and daughter verbalize understanding.

## 2019-10-23 ENCOUNTER — Ambulatory Visit: Payer: Self-pay | Attending: Family | Admitting: Family

## 2019-10-23 ENCOUNTER — Other Ambulatory Visit: Payer: Self-pay

## 2019-10-23 DIAGNOSIS — Z789 Other specified health status: Secondary | ICD-10-CM

## 2019-10-23 DIAGNOSIS — G8929 Other chronic pain: Secondary | ICD-10-CM

## 2019-10-23 DIAGNOSIS — M545 Low back pain: Secondary | ICD-10-CM

## 2019-10-23 NOTE — Patient Instructions (Addendum)
Urinalysis to evaluate back pain. Follow-up with primary physician as needed. Kidney Stones Kidney stones are rock-like masses that form inside of the kidneys. Kidneys are organs that make pee (urine). A kidney stone may move into other parts of the urinary tract, including:  The tubes that connect the kidneys to the bladder (ureters).  The bladder.  The tube that carries urine out of the body (urethra). Kidney stones can cause very bad pain and can block the flow of pee. The stone usually leaves your body (passes) through your pee. You may need to have a doctor take out the stone. What are the causes? Kidney stones may be caused by:  A condition in which certain glands make too much parathyroid hormone (primary hyperparathyroidism).  A buildup of a type of crystals in the bladder made of a chemical called uric acid. The body makes uric acid when you eat certain foods.  Narrowing (stricture) of one or both of the ureters.  A kidney blockage that you were born with.  Past surgery on the kidney or the ureters, such as gastric bypass surgery. What increases the risk? You are more likely to develop this condition if:  You have had a kidney stone in the past.  You have a family history of kidney stones.  You do not drink enough water.  You eat a diet that is high in protein, salt (sodium), or sugar.  You are overweight or very overweight (obese). What are the signs or symptoms? Symptoms of a kidney stone may include:  Pain in the side of the belly, right below the ribs (flank pain). Pain usually spreads (radiates) to the groin.  Needing to pee often or right away (urgently).  Pain when going pee (urinating).  Blood in your pee (hematuria).  Feeling like you may vomit (nauseous).  Vomiting.  Fever and chills. How is this treated? Treatment depends on the size, location, and makeup of the kidney stones. The stones will often pass out of the body through peeing. You may  need to:  Drink more fluid to help pass the stone. In some cases, you may be given fluids through an IV tube put into one of your veins at the hospital.  Take medicine for pain.  Make changes in your diet to help keep kidney stones from coming back. Sometimes, medical procedures are needed to remove a kidney stone. This may involve:  A procedure to break up kidney stones using a beam of light (laser) or shock waves.  Surgery to remove the kidney stones. Follow these instructions at home: Medicines  Take over-the-counter and prescription medicines only as told by your doctor.  Ask your doctor if the medicine prescribed to you requires you to avoid driving or using heavy machinery. Eating and drinking  Drink enough fluid to keep your pee pale yellow. You may be told to drink at least 8-10 glasses of water each day. This will help you pass the stone.  If told by your doctor, change your diet. This may include: ? Limiting how much salt you eat. ? Eating more fruits and vegetables. ? Limiting how much meat, poultry, fish, and eggs you eat.  Follow instructions from your doctor about eating or drinking restrictions. General instructions  Collect pee samples as told by your doctor. You may need to collect a pee sample: ? 24 hours after a stone comes out. ? 8-12 weeks after a stone comes out, and every 6-12 months after that.  Strain your pee every  time you pee (urinate), for as long as told. Use the strainer that your doctor recommends.  Do not throw out the stone. Keep it so that it can be tested by your doctor.  Keep all follow-up visits as told by your doctor. This is important. You may need follow-up tests. How is this prevented? To prevent another kidney stone:  Drink enough fluid to keep your pee pale yellow. This is the best way to prevent kidney stones.  Eat healthy foods.  Avoid certain foods as told by your doctor. You may be told to eat less protein.  Stay at a  healthy weight. Where to find more information  [place] [facility]): www.kidney.[facility]): www.urologyhealth.org Contact a doctor if:  You have pain that gets worse or does not get better with medicine. Get help right away if:  You have a fever or chills.  You get very bad pain.  You get new pain in your belly (abdomen).  You pass out (faint).  You cannot pee. Summary  Kidney stones are rock-like masses that form inside of the kidneys.  Kidney stones can cause very bad pain and can block the flow of pee.  The stones will often pass out of the body through peeing.  Drink enough fluid to keep your pee pale yellow. This information is not intended to replace advice given to you by your health care provider. Make sure you discuss any questions you have with your health care provider. Document Revised: 09/26/2018 Document Reviewed: 09/26/2018 Elsevier Patient Education  [place].

## 2019-10-23 NOTE — Progress Notes (Signed)
Virtual Visit via Telephone Note  I connected with Crystal [name], on 10/23/2019 at 3:01 PM by telephone due to the COVID-19 pandemic and verified that I am speaking with the correct person using two identifiers.  Due to current restrictions/limitations of in-office visits due to the COVID-19 pandemic, this scheduled clinical appointment was converted to a telehealth visit.   Consent: I discussed the limitations, risks, security and privacy concerns of performing an evaluation and management service by telephone and the availability of in person appointments. I also discussed with the patient that there may be a patient responsible charge related to this service. The patient expressed understanding and agreed to proceed.  Location of Patient: Home  Location of Provider: [place] [facility] [place]  Persons participating in Telemedicine visit: Crystal Sessum [name] [place], NP Orlan Leavens, [place]  History of Present Illness: Crystal [name] is a 49 year old female with history of squamous cell carcinoma of cervix, malignant neoplasm of cervix, cervical cancer, depression, and chest pain who presents for kidney stone follow-up.  1. KIDNEY PAIN FOLLOW-UP: Last visit 09/07/2019 with gynecologist Dr. Denman George. During that encounter CT abdomen pelvis obtained to evaluate abdominal pain for possible recurrence with low suspicion. Non-contrasted due to allergy and low suspicion for malignancy and normal [place]. It seemed that patient has chronic abdominal pains and frequently reports pains desiring scans. All of which have been normal.  CT abdomen pelvis on 09/17/2019 resulted no mass or adenopathy identified to suggest recurrent tumor or metastatic disease within the abdomen or pelvis and bilateral nonobstructing renal calculi. As a result of renal calculi patient was referred to primary provider.   Today patient reports she has pain at bilateral lower back which  radiates around to bilateral lower -abdomen. Reports she has had back pain every day for the last 4 months. Rates pain 7-9/10 and lasts all day. Reports urination or having a bowel movement makes better. 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 Denies taking any over-the-counter medications. Denies blood in urine, nausea, and vomiting.  Past Medical History:  Diagnosis Date  . Allergy   . Cancer ([place])    cervical cancer dx.   . History of kidney stones    x1   . Kidney stones    cyst on kidney -no problemsm hx of kidney stones  . 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 PONV (postoperative nausea and vomiting)    Allergies  Allergen Reactions  . Contrast Media [Iodinated Diagnostic Agents] Itching    13 hr prep in the future; itching of tongue and around the mouth 03/02/2019    No current outpatient medications on file prior to visit.   Current Facility-Administered Medications on File Prior to Visit  Medication Dose Route Frequency Provider Last Rate Last Admin  . 0.9 %  sodium chloride infusion  500 mL Intravenous Once Nandigam, Kavitha V, MD      . 0.9 %  sodium chloride infusion  500 mL Intravenous Once Nandigam, Venia Minks, MD        Observations/Objective: Alert and oriented x 3. Not in acute distress. Physical examination not completed as this is a telemedicine visit.  Assessment and Plan: 1. Chronic bilateral low back pain without sciatica: -Patient with chronic history of back pain. -Today patient presents after renal calculi was found on CT abdomen pelvis on 09/14/2019. -CT resulted two small stones noted within the mid and inferior pole of the  left kidney which measure up to 2-3 mm. No hydronephrosis identified bilaterally. Urinary bladder negative. -Considering renal calculi are small, non-obstructing, and without hydronephrosis no further treatment needed at this time.  -Seems unlikely that chronic back pain  is related to kidney stones. -Will obtain urine to evaluate for possible urinary tract infection and/or presence of microscopic blood in urine.  -Follow-up with primary physician as needed. - Urinalysis, Routine w reflex microscopic  2. Language barrier: English as a second language teacher participated during this visit. Interpreter Name: Leonard Schwartz, ID#: [id number]. Interpreter Name: Leanna Sato, ID#: [id number].  Follow Up Instructions: Patient was given clear instructions to go to Emergency Department or return to medical center if symptoms don't improve, worsen, or new problems develop.The patient verbalized understanding.  I discussed the assessment and treatment plan with the patient. The patient was provided an opportunity to ask questions and all were answered. The patient agreed with the plan and demonstrated an understanding of the instructions.   The patient was advised to call back or seek an in-person evaluation if the symptoms worsen or if the condition fails to improve as anticipated.  I provided 40 minutes total of non-face-to-face time during this encounter including median intraservice time, reviewing previous notes, labs, imaging, medications, management and patient verbalized understanding.    Camillia Herter, NP  [facility] [place], [place]   10/23/2019, 3:01 PM

## 2019-10-24 LAB — URINALYSIS, ROUTINE W REFLEX MICROSCOPIC
Bilirubin, UA: NEGATIVE
Glucose, UA: NEGATIVE
Ketones, UA: NEGATIVE
Leukocytes,UA: NEGATIVE
Nitrite, UA: NEGATIVE
Protein,UA: NEGATIVE
RBC, UA: NEGATIVE
Specific Gravity, UA: 1.023 (ref 1.005–1.030)
Urobilinogen, Ur: 1 mg/dL (ref 0.2–1.0)
pH, UA: 6.5 (ref 5.0–7.5)

## 2019-10-25 NOTE — Progress Notes (Signed)
Please call patient with update.  Urinalysis negative.   Follow-up with primary physician as needed.

## 2019-10-26 ENCOUNTER — Telehealth: Payer: Self-pay

## 2019-10-26 NOTE — Telephone Encounter (Signed)
Patient was called and a voicemail was left informing patient to return phone call for lab results.  Crystal [name] [id number]

## 2019-10-26 NOTE — Telephone Encounter (Signed)
-----   Message from Camillia Herter, NP sent at 10/25/2019  8:37 AM EDT ----- Please call patient with update.  Urinalysis negative.   Follow-up with primary physician as needed.

## 2020-03-03 ENCOUNTER — Telehealth: Payer: Self-pay | Admitting: *Deleted

## 2020-03-03 NOTE — Telephone Encounter (Signed)
Patient's daughter called and scheduled a follow up appt for next week

## 2020-03-06 ENCOUNTER — Telehealth: Payer: Self-pay | Admitting: *Deleted

## 2020-03-06 NOTE — Telephone Encounter (Signed)
Called and left the patient's daughter a message to call the office back. Need to move the appt from 10/22 to either 10/18 or 10/20

## 2020-03-06 NOTE — Telephone Encounter (Signed)
Patient's daughter called back and moved appt from 10/22 to 10/20

## 2020-03-12 ENCOUNTER — Other Ambulatory Visit: Payer: Self-pay

## 2020-03-12 ENCOUNTER — Inpatient Hospital Stay: Payer: Self-pay | Attending: Gynecologic Oncology | Admitting: Gynecologic Oncology

## 2020-03-12 ENCOUNTER — Encounter: Payer: Self-pay | Admitting: Gynecologic Oncology

## 2020-03-12 ENCOUNTER — Other Ambulatory Visit (HOSPITAL_COMMUNITY)
Admission: RE | Admit: 2020-03-12 | Discharge: 2020-03-12 | Disposition: A | Discharge status: Home / Self Care | Payer: Self-pay | Source: Ambulatory Visit | Attending: Gynecologic Oncology | Admitting: Gynecologic Oncology

## 2020-03-12 VITALS — BP 107/56 | HR 75 | Temp 98.5°F | Resp 20 | Wt 120.8 lb

## 2020-03-12 DIAGNOSIS — C539 Malignant neoplasm of cervix uteri, unspecified: Secondary | ICD-10-CM | POA: Insufficient documentation

## 2020-03-12 DIAGNOSIS — Z853 Personal history of malignant neoplasm of breast: Secondary | ICD-10-CM | POA: Insufficient documentation

## 2020-03-12 DIAGNOSIS — Z90710 Acquired absence of both cervix and uterus: Secondary | ICD-10-CM | POA: Insufficient documentation

## 2020-03-12 DIAGNOSIS — K219 Gastro-esophageal reflux disease without esophagitis: Secondary | ICD-10-CM | POA: Insufficient documentation

## 2020-03-12 DIAGNOSIS — Z08 Encounter for follow-up examination after completed treatment for malignant neoplasm: Secondary | ICD-10-CM

## 2020-03-12 DIAGNOSIS — Z90722 Acquired absence of ovaries, bilateral: Secondary | ICD-10-CM | POA: Insufficient documentation

## 2020-03-12 DIAGNOSIS — Z8541 Personal history of malignant neoplasm of cervix uteri: Secondary | ICD-10-CM

## 2020-03-12 NOTE — Progress Notes (Signed)
Follow Up Note: Gyn-Onc  Crystal [name] 49 y.o. female  CC:  Chief Complaint  Patient presents with  . Malignant neoplasm of cervix, unspecified site   Assessment/Plan:  49 year old s/p robotic-assisted type III radical laparoscopic hysterectomy with bilateral salpingectomy and bilateral sentinel lymph node biopsy on 07/01/15 for stage IB1 cervical SCC.  No residual carcinoma on hysterectomy specimen. Low risk features to her tumor therefore no adjuvant therapy recommended. No evidence for recurrence on today's exam.  1/annual pap surveillance for this with hpv cotesting - will follow-up this result and notify patient.  2/ I discussed symptoms concerning for recurrence including vaginal bleeding or discharge, pelvic pain, cough, or lower extremity edema.  3/ Follow-up for surveillance 6 monthly in February, 2022. After that time she will complete her cancer surveillance but should continue to get vaginal cytology and screening q 5 years (if HPV negative) and can do so with her general OBGYN, Dr Harolyn Rutherford.    HPI: Crystal [name] is a 49 year old female initially referred by Dr. 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  She had her first abnormal Pap smear on 02/26/2015 which was HGSIL. She then underwent colposcopic evaluation of the cervix on 04/02/2015, which was benign in appearance. A biopsy from the ectocervix was benign and endocervical curetting showed CIN-3. As follow-up, she underwent a LEEP procedure on 05/12/2015. 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 Dimensions of the carcinoma were not included however there was a focus of dysplasia suspicious for invasive carcinoma involving the deep margin.  PET on 06/20/15: IMPRESSION: 1. No primary hypermetabolic primary cervical mass or evidence of nodal metastasis. 2. Focal hypermetabolism in the lateral left breast. No  CT correlate. Consider correlation with diagnostic mammography and ultrasound to exclude otherwise occult breast lesion. 3. Probable left nephrolithiasis  On 07/01/15, she underwent a robotic-assisted type III radical laparoscopic hysterectomy with bilateral salpingoophorectomy and bilateral pelvic lymphadenectomy by Dr. Denman George.  Final pathology revealed: Diagnosis 1. Uterus +/- tubes/ovaries, neoplastic, cervix CERVIX WITH LOW GRADE SQUAMOUS INTRAEPITHELIAL LESION, CIN-1 NO RESIDUAL HIGH GRADE INTRAEPITHELIAL LESION OR INVASIVE NEOPLASM IDENTIFIED VAGINAL CUFF, PARAMETRIAL AND ALL RESECTION MARGINS ARE NEGATIVE FOR TUMOR ENDOMETRIAL POLYP AND INACTIVE ENDOMETRIUM MYOMETRIUM: NO PATHOLOGICAL ALTERATIONS BILATERAL FALLOPIAN TUBES: HISTOLOGICAL UNREMARKABLE 2. Lymph node, sentinel, biopsy, right external iliac ONE BENIGN LYMPH NODE (0/1) 3. Lymph node, sentinel, biopsy, left obturator ONE BENIGN LYMPH NODE (0/1)  She was re-admitted to the hospital on POD 7, 07/08/15, for persistent nausea, vomiting, dehydration, no BM, dyspnea.  CT CAP on 07/08/15 failed to demonstrate evidence for intraperitoneal abscess/GU or GI injury or pathology including no obstruction. UA and culture revealed a catheter associated UTI (e coli - pan sensitive) which was treated with cipro. She was discharged home on 07/10/15 after IV hydration, aggressive bowel regimen.  She failed a voiding trial during her hospitalization and the foley had to be replaced due to urinary retention.  She presented to the office on 07/15/15 for a repeat voiding trial. She passed this well and her foley was left out, however she subsequently developed urinary retention and required teaching to intermittently self catheterize.  On 10/03/2015 she underwent a breast lumpectomy on the left which confirmed lobular hyperplasia but no carcinoma in situ identified. No additional therapy is required for this.  She was worked up for epigastric pain by GI  in December, 2018 with an EGD which confirmed  GERD.  She had CT abd/pelvis in March, 2019 for flank pain which showed no evidence of recurrent cancer.   She reported right flank pain in October, 2019. CT abd/pelvis on 03/21/18 showed no evidence of recurrence.  There was an acquired right renal cyst measuring 9 mm that was simple in appearance and unchanged.  The right adnexa contained a 2.2 x 2.7 cm cyst.  No free fluid or signs of malignancy were identified.  When she was seen in October, 2020 she reports that she has had 2 weeks of mid abdominal, periumbilical, and lower pelvic abdominal pains.  A CT abd/pelvis on March 02, 2019 revealed no evidence of malignancy and the course of the patient's abdominal pain was not identified.  Interval History:   She reported a 61-month history of pelvic pain and March 2021 and a transvaginal ultrasound scan was ordered and performed on August 15, 2019.  This revealed a surgically absent uterus, a right ovary measuring 2.5 x 1.4 x 1.9 cm with a dominant follicular cyst without a mass.  The left ovary was not visualized and likely obscured by bowel.  There is no free fluid or masses noted on ultrasound.  She had abdominal pain reported in April, 2021 and CT imaging was performed that showed no pathology to explain this and no evidence for recurrence.   Review of Systems  Constitutional: Feels well.  No fever, chills, early satiety, or unintentional weight loss or gain.  Cardiovascular: No chest pain, shortness of breath, or edema.  Pulmonary: coughs up blood Gastrointestinal: No nausea, vomiting, or diarrhea. No bright red blood per rectum or change in bowel movement. + abdominal pains - chronic, stable Genitourinary: Minimal bladder sensation. No vaginal bleeding or discharge.  Musculoskeletal: No myalgia or joint pain. Neurologic: No weakness, numbness, or change in gait.  Psychology: No depression, anxiety, or insomnia.  Current Meds:  Outpatient  Encounter Medications as of 03/12/2020  Medication Sig  . acetaminophen (TYLENOL) 500 MG tablet Take 1,000 mg by mouth every 4 (four) hours as needed.  . [DISCONTINUED] 0.9 %  sodium chloride infusion   . [DISCONTINUED] 0.9 %  sodium chloride infusion    No facility-administered encounter medications on file as of 03/12/2020.    Allergy:  Allergies  Allergen Reactions  . Contrast Media [Iodinated Diagnostic Agents] Itching    13 hr prep in the future; itching of tongue and around the mouth 03/02/2019    Social Hx:   Social History   Socioeconomic History  . Marital status: Widowed    Spouse name: Not on file  . Number of children: 2  . Years of education: Not on file  . Highest education level: Not on file  Occupational History  . Not on file  Tobacco Use  . Smoking status: Never Smoker  . Smokeless tobacco: Never Used  Vaping Use  . Vaping Use: Never used  Substance and Sexual Activity  . Alcohol use: Yes    Comment: occasionally  . Drug use: No  . Sexual activity: Not Currently    Birth control/protection: Surgical  Other Topics Concern  . Not on file  Social History Narrative  . Not on file   Social Determinants of Health   Financial Resource Strain:   . Difficulty of Paying Living Expenses: Not on file  Food Insecurity:   . Worried About Charity fundraiser in the Last Year: Not on file  . Ran Out of Food in the Last Year: Not on file  Transportation Needs:   . Film/video editor (Medical): Not on file  . Lack of Transportation (Non-Medical): Not on file  Physical Activity:   . Days of Exercise per Week: Not on file  . Minutes of Exercise per Session: Not on file  Stress:   . Feeling of Stress : Not on file  Social Connections:   . Frequency of Communication with Friends and Family: Not on file  . Frequency of Social Gatherings with Friends and Family: Not on file  . Attends Religious Services: Not on file  . Active Member of Clubs or  Organizations: Not on file  . Attends Archivist Meetings: Not on file  . Marital Status: Not on file  Intimate Partner Violence:   . Fear of Current or Ex-Partner: Not on file  . Emotionally Abused: Not on file  . Physically Abused: Not on file  . Sexually Abused: Not on file    Past Surgical Hx:  Past Surgical History:  Procedure Laterality Date  . BREAST BIOPSY Left 08/28/2015   MRI- High Risk  . BREAST BIOPSY Right 08/28/2015   MRI- Benign  . BREAST EXCISIONAL BIOPSY Left 10/03/2015  . BREAST LUMPECTOMY WITH RADIOACTIVE SEED LOCALIZATION Left 10/03/2015   Procedure: LEFT BREAST LUMPECTOMY WITH RADIOACTIVE SEED LOCALIZATION;  Surgeon: Fanny Skates, MD;  Location: [place];  Service: General;  Laterality: Left;  . CERVICAL BIOPSY  W/ LOOP ELECTRODE EXCISION     12'16  . CESAREAN SECTION     2 previous  . ROBOTIC ASSISTED TOTAL HYSTERECTOMY Bilateral 07/01/2015   Procedure: XI ROBOTIC ASSISTED RADICAL TYPE II HYSTERECTOMY, BILATERAL SALPINGECTOMY, SENTINAL LYMPH NODE BIOPSY;  Surgeon: Everitt Amber, MD;  Location: [facility];  Service: Gynecology;  Laterality: Bilateral;    Past Medical Hx:  Past Medical History:  Diagnosis Date  . Allergy   . Cancer ([place])    cervical cancer dx.   . History of kidney stones    x1   . Kidney stones    cyst on kidney -no problemsm hx of kidney stones  . 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 PONV (postoperative nausea and vomiting)     Family Hx:  Family History  Problem Relation Age of Onset  . Hypertension Sister   . Colon cancer Neg Hx   . Esophageal cancer Neg Hx   . Rectal cancer Neg Hx   . Stomach cancer Neg Hx     Vitals:  Blood pressure (!) 107/56, pulse 75, temperature 98.5 F (36.9 C), temperature source Tympanic, resp. rate 20, weight 120 lb 12.8 oz (54.8 kg), last menstrual period 06/07/2015, SpO2 100 %.  Physical Exam:  General: Well developed, well nourished  female in no acute distress. Alert and oriented x 3.  Cardiovascular: Regular rate and rhythm. S1 and S2 normal.  Lungs: Clear to auscultation bilaterally. No wheezes/crackles/rhonchi noted.  Skin: No rashes or lesions present. Back: No CVA tenderness.  Abdomen: Abdomen soft,  and non-obese. Active bowel sounds in all quadrants. No evidence of a fluid wave or abdominal masses.  Lap sites soft. Extremities: No bilateral cyanosis, edema, or clubbing.  Pelvic exam: No residual granulation tissue at cuff. No nodulariy or lesions.  Rectal: smooth, no nodularity or masses   Thereasa Solo, MD  03/12/2020, 5:24 PM

## 2020-03-12 NOTE — Patient Instructions (Signed)
Dr Denman George recommends that you discuss your abdominal pain with your primary care doctor as there is no evidence that you have cancer causing this pain.  Please notify Dr Denman George at phone number [phone] if you notice vaginal bleeding, new pelvic or abdominal pains, bloating, feeling full easy, or a change in bladder or bowel function.   Please contact Dr Serita Grit office (at [phone]) in January to request an appointment with her for April, 2022.

## 2020-03-14 ENCOUNTER — Ambulatory Visit: Payer: Self-pay | Admitting: Gynecologic Oncology

## 2020-03-14 LAB — CYTOLOGY - PAP
Comment: NEGATIVE
Diagnosis: NEGATIVE
High risk HPV: NEGATIVE

## 2020-03-17 ENCOUNTER — Telehealth: Payer: Self-pay

## 2020-03-17 NOTE — Telephone Encounter (Signed)
Told daughter Corlis Hove that the pap smear was negative for pre-cancer or cancer. HPV negative. Some yeast cells seen. Daughter asked if the yeast can be causing her lower back pain. Told her that a UTI may cause lower back pain but the yeast would not. Pt has an appointment with PCP on Thursday. Told daughter that she should mention the lower back pain to pcp.

## 2020-03-20 ENCOUNTER — Encounter: Payer: Self-pay | Admitting: Family

## 2020-03-20 ENCOUNTER — Other Ambulatory Visit: Payer: Self-pay | Admitting: Family

## 2020-03-20 ENCOUNTER — Telehealth: Payer: Self-pay

## 2020-03-20 ENCOUNTER — Ambulatory Visit: Payer: Self-pay | Attending: Family | Admitting: Family

## 2020-03-20 ENCOUNTER — Other Ambulatory Visit: Payer: Self-pay

## 2020-03-20 VITALS — BP 108/68 | HR 89 | Temp 97.3°F | Resp 16 | Wt 121.2 lb

## 2020-03-20 DIAGNOSIS — R109 Unspecified abdominal pain: Secondary | ICD-10-CM

## 2020-03-20 DIAGNOSIS — Z23 Encounter for immunization: Secondary | ICD-10-CM

## 2020-03-20 DIAGNOSIS — K5900 Constipation, unspecified: Secondary | ICD-10-CM

## 2020-03-20 DIAGNOSIS — M545 Low back pain, unspecified: Secondary | ICD-10-CM

## 2020-03-20 DIAGNOSIS — Z789 Other specified health status: Secondary | ICD-10-CM

## 2020-03-20 DIAGNOSIS — B379 Candidiasis, unspecified: Secondary | ICD-10-CM

## 2020-03-20 LAB — POCT URINALYSIS DIP (CLINITEK)
Bilirubin, UA: NEGATIVE
Glucose, UA: NEGATIVE mg/dL
Ketones, POC UA: NEGATIVE mg/dL
Nitrite, UA: NEGATIVE
POC PROTEIN,UA: NEGATIVE
Spec Grav, UA: 1.02 (ref 1.010–1.025)
Urobilinogen, UA: 0.2 E.U./dL
pH, UA: 6 (ref 5.0–8.0)

## 2020-03-20 MED ORDER — CYCLOBENZAPRINE HCL 10 MG PO TABS: 10.0000 mg | ORAL_TABLET | Freq: Every day | ORAL | 0 refills | Status: DC

## 2020-03-20 MED ORDER — POLYETHYLENE GLYCOL 3350 17 GM/SCOOP PO POWD: 17.0000 g | Freq: Every day | ORAL | 1 refills | Status: DC

## 2020-03-20 MED ORDER — NAPROXEN 500 MG PO TABS: 500.0000 mg | ORAL_TABLET | Freq: Two times a day (BID) | ORAL | 0 refills | Status: DC

## 2020-03-20 MED ORDER — CYCLOBENZAPRINE HCL 10 MG PO TABS: 10.0000 mg | ORAL_TABLET | Freq: Three times a day (TID) | ORAL | 0 refills | Status: DC | PRN

## 2020-03-20 MED FILL — CYCLOBENZAPRINE 10 MG TAB: 10 | 30 days supply | Qty: 30 | Fill #0

## 2020-03-20 MED FILL — ?NAPROXEN 500 MG TABS: 500 | 30 days supply | Qty: 60 | Fill #0

## 2020-03-20 NOTE — Progress Notes (Signed)
Reports L side upper back pain around ribs w/ burning in stomach,constipation,bloating, pain w/ urination, all started around the same time x 1 month ago  Seen by GYN x 2 wks ago and was diagnosed w/ yeast, did not take any medication for it

## 2020-03-20 NOTE — Telephone Encounter (Signed)
[id number] from daughter, Corlis Hove.  Daughter stated patient saw pcp today and was told to address yeast infection with Dr. Denman George.  Yeast was detected on pap smear 03/12/2020.  Patient is having itching but denied discharge or burning.  Per Joylene John, NP, patient can use OTC monistat, if symptoms not improving call back.  Daughter verbalized understanding of all information provided.

## 2020-03-20 NOTE — Progress Notes (Signed)
Patient ID: Crystal [name], female    DOB: 1970-11-07  MRN: [id number]  CC: Back Pain  Subjective: Parish Augustine is a 49 y.o. 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   1. BACK PAIN FOLLOW-UP: Location: right lower back  Onset: 2 months  Radiation: right shoulder, right lower abdomen and underneath right ribs  Symptoms Worse with: constipation (currently 1 bowel movement daily, usually having 3 daily) Better with: camomile tea Trauma: denies Popping/clicking sounds with movement/bending: denies Muscle spasms: near shoulders Night pain: sometimes  Patient Active Problem List   Diagnosis Date Noted  . Chest pain 07/28/2017  . Hemoptysis 03/04/2017  . Gastritis 12/06/2016  . Herpes zoster without complication 69/45/0388  . Depression 04/07/2016  . 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 Urinary retention with incomplete bladder emptying 07/23/2015  . Post-operative nausea and vomiting 07/08/2015  . Cervical cancer ([place]) 07/01/2015  . Malignant neoplasm of cervix ([place]) 06/02/2015  . Squamous cell carcinoma of cervix ([place]) 04/14/2015  . Renal mass, right 04/14/2015  . Periodic health assessment, general screening, adult 02/23/2013     Current Outpatient Medications on File Prior to Visit  Medication Sig Dispense Refill  . acetaminophen (TYLENOL) 500 MG tablet Take 1,000 mg by mouth every 4 (four) hours as needed.     No current facility-administered medications on file prior to visit.    Allergies  Allergen Reactions  . Contrast Media [Iodinated Diagnostic Agents] Itching    13 hr prep in the future; itching of tongue and around the mouth 03/02/2019    Social History    Socioeconomic History  . Marital status: Widowed    Spouse name: Not on file  . Number of children: 2  . Years of education: Not on file  . Highest education level: Not on file  Occupational History  . Not on file  Tobacco Use  . Smoking status: Never Smoker  . Smokeless tobacco: Never Used  Vaping Use  . Vaping Use: Never used  Substance and Sexual Activity  . Alcohol use: Yes    Comment: occasionally  . Drug use: No  . Sexual activity: Not Currently    Birth control/protection: Surgical  Other Topics Concern  . Not on file  Social History Narrative  . Not on file   Social Determinants of Health   Financial Resource Strain:   . Difficulty of Paying Living Expenses: Not on file  Food Insecurity:   . Worried About Charity fundraiser in the Last Year: Not on file  . Ran Out of Food in the Last Year: Not on file  Transportation Needs:   . Lack of Transportation (Medical): Not on file  . Lack of Transportation (Non-Medical): Not on file  Physical Activity:   . Days of Exercise per Week: Not on file  . Minutes of Exercise per Session: Not on file  Stress:   . Feeling of Stress : Not on file  Social Connections:   . Frequency of Communication with Friends and Family: Not on file  . Frequency of Social Gatherings with Friends and Family: Not on file  . Attends Religious Services: Not on file  . Active Member of Clubs or Organizations: Not on file  . Attends Archivist Meetings:  Not on file  . Marital Status: Not on file  Intimate Partner Violence:   . Fear of Current or Ex-Partner: Not on file  . Emotionally Abused: Not on file  . Physically Abused: Not on file  . Sexually Abused: Not on file    Family History  Problem Relation Age of Onset  . Hypertension Sister   . Colon cancer Neg Hx   . Esophageal cancer Neg Hx   . Rectal cancer Neg Hx   . Stomach cancer Neg Hx     Past Surgical History:  Procedure Laterality Date  . BREAST BIOPSY Left  08/28/2015   MRI- High Risk  . BREAST BIOPSY Right 08/28/2015   MRI- Benign  . BREAST EXCISIONAL BIOPSY Left 10/03/2015  . BREAST LUMPECTOMY WITH RADIOACTIVE SEED LOCALIZATION Left 10/03/2015   Procedure: LEFT BREAST LUMPECTOMY WITH RADIOACTIVE SEED LOCALIZATION;  Surgeon: Fanny Skates, MD;  Location: [facility];  Service: General;  Laterality: Left;  . CERVICAL BIOPSY  W/ LOOP ELECTRODE EXCISION     12'16  . CESAREAN SECTION     2 previous  . ROBOTIC ASSISTED TOTAL HYSTERECTOMY Bilateral 07/01/2015   Procedure: XI ROBOTIC ASSISTED RADICAL TYPE II HYSTERECTOMY, BILATERAL SALPINGECTOMY, SENTINAL LYMPH NODE BIOPSY;  Surgeon: Everitt Amber, MD;  Location: [facility];  Service: Gynecology;  Laterality: Bilateral;    ROS: Review of Systems Negative except as stated above  PHYSICAL EXAM: BP 108/68 (BP Location: Right Arm, Patient Position: Sitting, Cuff Size: Normal)   Pulse 89   Temp (!) 97.3 F (36.3 C) (Temporal)   Resp 16   Wt 121 lb 3.2 oz (55 kg)   LMP 06/07/2015 (Exact Date)   SpO2 98%   BMI 22.17 kg/m   Physical Exam General appearance - alert, well appearing, and in no distress and oriented to person, place, and time Mental status - alert, oriented to person, place, and time, normal mood, behavior, speech, dress, motor activity, and thought processes Neck - supple, no significant adenopathy Lymphatics - no palpable lymphadenopathy, no hepatosplenomegaly Chest - clear to auscultation, no wheezes, rales or rhonchi, symmetric air entry, no tachypnea, retractions or cyanosis Heart - normal rate, regular rhythm, normal S1, S2, no murmurs, rubs, clicks or gallops Abdomen - soft, nontender, nondistended, no masses or organomegaly Back exam - limited range of motion, tenderness noted  Back Exam:    Inspection:  Normal spinal curvature.  No deformity, ecchymosis, erythema, or lesions   Curvature: Normal   Deformity: no  Ecchymosis: no none  Erythema:  no none  Lesions:  no    Palpation:     Midline spinal tenderness: yes thoracic and lumbar      Paralumbar tenderness: yes R>L    Parathoracic tenderness: yes R>L     Range of Motion: limited     Flexion: Fingers to Ankle     Extension:Decreased     Lateral bending:Decreased    Rotation:Normal    Neuro Exam:Lower extremity DTRs normal & symmetric.  Strength and sensation intact.     Ankle dorsiflexion strength:Within Normal Limits     Ankle plantar flexion strength:Within Normal Limits Neurological - alert, oriented, normal speech, no focal findings or movement disorder noted, neck supple without rigidity  ASSESSMENT AND PLAN: 1. Acute bilateral low back pain, unspecified whether sciatica present: - Bilateral lower back pain, right greater than left.  - Naproxen for back pain.  - Cyclobenzaprine for muscle spasms. May cause drowsiness. Counseled to not consume if operating heavy machinery  or driving. Counseled to not consume with alcohol. - Follow-up with primary physician in 6 weeks or sooner if needed. - naproxen (NAPROSYN) 500 MG tablet; Take 1 tablet (500 mg total) by mouth 2 (two) times daily with a meal.  Dispense: 60 tablet; Refill: 0 - cyclobenzaprine (FLEXERIL) 10 MG tablet; Take 1 tablet (10 mg total) by mouth at bedtime.  Dispense: 30 tablet; Refill: 0  2. Right flank pain: - Urinalysis to screen for urinary tract infection, resulted negative. - POCT URINALYSIS DIP (CLINITEK)  3. Constipation, unspecified constipation type: - Polyethylene glycol powder for constipation as prescribed.  Drink plenty of fluid, preferably water, throughout the day  Eat foods high in fiber such as fruits, vegetables, and grains  Exercise, such as walking, is a good way to keep your bowels regular  Drink warm fluids, especially warm prune juice, or decaf coffee  Eat a 1/2 cup of real oatmeal (not instant), 1/2 cup applesauce, and 1/2-1 cup warm prune juice every day - Follow-up with primary physician as  needed. - polyethylene glycol powder (GLYCOLAX/MIRALAX) 17 GM/SCOOP powder; Take 17 g by mouth daily.  Dispense: 3350 g; Refill: 1  4. Need for immunization against influenza: - Flu vaccine administered during today's visit. - Flu Vaccine QUAD 36+ mos IM  5. Yeast infection:  - Patient reports she was diagnosed with a yeast infection about 2 weeks ago by her Obstetrician/Gynecologist. Reports she was instructed to receive treatment for this from her primary provider.  - No evidence of sexually transmitted infections panel in patient's chart.  - Offered patient repeat cervicovaginal ancillary swab, patient declined. - Reports she plans to follow-up with Obstetrician/Gynecology to see if they will prescribe medication for her.  6. Language barrier: - [employer] participated during today's visit. Interpreter Name: Lowella Dandy, ID#: [id number].  Patient was given the opportunity to ask questions.  Patient verbalized understanding of the plan and was able to repeat key elements of the plan. Patient was given clear instructions to go to Emergency Department or return to medical center if symptoms don't improve, worsen, or new problems develop.The patient verbalized understanding.   Orders Placed This Encounter  Procedures  . Flu Vaccine QUAD 36+ mos IM  . POCT URINALYSIS DIP (CLINITEK)     Requested Prescriptions   Signed Prescriptions Disp Refills  . polyethylene glycol powder (GLYCOLAX/MIRALAX) 17 GM/SCOOP powder 3350 g 1    Sig: Take 17 g by mouth daily.  . naproxen (NAPROSYN) 500 MG tablet 60 tablet 0    Sig: Take 1 tablet (500 mg total) by mouth 2 (two) times daily with a meal.  . cyclobenzaprine (FLEXERIL) 10 MG tablet 30 tablet 0    Sig: Take 1 tablet (10 mg total) by mouth at bedtime.    No follow-ups on file.  Camillia Herter, NP

## 2020-03-20 NOTE — Patient Instructions (Addendum)
Naproxen and Flexeril for back pain. Miralax for constipation. Follow-up with primary physician as scheduled.   Naproxeno y Flexeril para el dolor de espalda. Miralax para el estreimiento. Seguimiento con el mdico de cabecera segn lo programado.   Dolor de espalda agudo en los adultos Acute Back Pain, Adult El dolor de espalda agudo es repentino y por lo general no dura mucho tiempo. Se debe generalmente a una lesin de los msculos y tejidos de la espalda. La lesin puede ser el resultado de:  Estiramiento en exceso o desgarro (esguince) de un msculo o ligamento. Los ligamentos son tejidos que [employer]. Levantar algo de forma incorrecta puede producir un esguince de espalda.  Desgaste (degeneracin) de los discos vertebrales. Los discos vertebrales son tejido Advertising account planner que proporciona acolchonamiento a los huesos de la columna vertebral (vrtebras).  Movimientos de giro, como al practicar deportes o realizar trabajos de [place].  Un golpe en la espalda.  Artritis. Es posible Hydrologist un examen fsico, anlisis de laboratorio u otros estudios de diagnstico por imgenes para Animator causa del Social research officer, government. El dolor de espalda agudo generalmente desaparece con reposo y cuidados en la casa. Sigue estas indicaciones en tu casa: Control del dolor, el entumecimiento y la hinchazn  Toma los medicamentos de venta libre y los recetados solamente como se lo haya indicado el mdico.  El mdico puede recomendarle que se aplique hielo durante las primeras 24a 48horas despus del comienzo del Social research officer, government. Para hacer esto: ? Ponga el hielo en una bolsa plstica. ? Coloque una [employer] piel y Therapist, nutritional. ? Coloque el hielo durante 53minutos, 2 a 3veces por da.  Si se lo indican, aplique calor en la zona afectada con la frecuencia que le haya indicado el mdico. Use la fuente de calor que el mdico le recomiende, como una compresa de calor hmedo o una almohadilla  trmica. ? Colquese una [employer] piel y la fuente de Freight forwarder. ? Aplique calor durante 20 a 71minutos. ? Retire la fuente de calor si la piel se pone de color rojo brillante. Esto es especialmente importante si no puede sentir dolor, calor o fro. Corre un mayor riesgo de sufrir quemaduras. Actividad   No permanezca en la cama. Hacer reposo en la cama por ms de 1 a 2 das puede demorar su recuperacin.  Mantenga una buena postura al sentarse y pararse. No se incline hacia adelante al sentarse ni se encorve al pararse. ? Si trabaja en un escritorio, sintese cerca de este para no tener que inclinarse. Mantenga el mentn hacia abajo. Mantenga el cuello hacia atrs y los codos flexionados en ngulo recto. La posicin de los brazos debe verse como la letra "L". ? Cuando conduzca, sintese elevado y cerca del volante. Agregue un apoyo para la espalda (lumbar) al asiento del automvil, si es necesario.  Realice caminatas cortas en superficies planas tan pronto como le sea posible. Trate de caminar un poco ms de Publishing copy.  No se siente, conduzca o permanezca de pie en un mismo lugar durante ms de 30 minutos seguidos. Pararse o sentarse durante largos perodos de Radiographer, therapeutic la espalda.  No conduzca ni use maquinaria pesada mientras toma analgsicos recetados.  Use tcnicas apropiadas para levantar objetos. Cuando se inclina y Chief Executive Officer un [place], utilice posiciones que no sobrecarguen tanto la espalda: ? [place]. ? Mantenga la carga cerca del cuerpo. ? No gire el cuerpo.  Haga actividad fsica habitualmente como se lo  haya indicado el mdico. Hacer ejercicios ayuda a que la espalda sane ms rpido y [place] a Product/process development scientist las lesiones de la espalda al [employer] msculos fuertes y flexibles.  Trabaje con un fisioterapeuta para crear un programa de ejercicios seguros, segn lo recomiende el mdico. Haga ejercicios como se lo haya indicado el  fisioterapeuta. Estilo de vida  Mantenga un peso saludable. El sobrepeso sobrecarga la espalda y hace que resulte difcil tener una buena [place].  Evite actividades o situaciones que lo hagan sentirse ansioso o estresado. El estrs y la ansiedad aumentan la tensin muscular y pueden empeorar el dolor de espalda. Aprenda formas de [employer] ansiedad y [place], como a travs del ejercicio. Indicaciones generales  Duerma sobre un colchn firme en una posicin cmoda. Intente acostarse de costado, con las rodillas ligeramente flexionadas. Si se recuesta [employer], coloque una almohada debajo de las rodillas.  Siga el plan de tratamiento como se lo haya indicado el mdico. Puede incluir: ? Terapia cognitiva o conductual. ? Acupuntura o terapia de masajes. ? Yoga o meditacin. Comuncate con un mdico si:  Siente un dolor que no se alivia con reposo o medicamentos.  Siente mucho dolor que se extiende a las piernas o las nalgas.  El dolor no mejora luego de 2 semanas.  Siente dolor por la noche.  Pierde peso sin proponrselo.  Tiene fiebre o escalofros. Solicite ayuda inmediatamente si:  Tiene nuevos problemas para controlar la vejiga o los intestinos.  Siente debilidad o adormecimiento inusuales en los brazos o en las piernas.  Siente nuseas o vmitos.  Siente dolor abdominal.  Siente que va a desmayarse. Resumen  El dolor de espalda agudo es repentino y por lo general no dura mucho tiempo.  Use tcnicas apropiadas para levantar objetos. Cuando se inclina y levanta un [place], utilice posiciones que no sobrecarguen tanto la espalda.  Tome los medicamentos de venta libre o recetados y aplique calor o hielo solamente como se lo haya indicado el mdico. Esta informacin no tiene Marine scientist el consejo del mdico. Asegrese de hacerle al mdico cualquier pregunta que tenga. Document Revised: 12/16/2017 Document Reviewed: 02/24/2017 Elsevier Patient Education   [place].

## 2020-04-14 ENCOUNTER — Other Ambulatory Visit: Payer: Self-pay

## 2020-04-14 ENCOUNTER — Encounter: Payer: Self-pay | Admitting: Family Medicine

## 2020-04-14 ENCOUNTER — Ambulatory Visit: Payer: Self-pay | Attending: Family Medicine | Admitting: Family Medicine

## 2020-04-14 VITALS — BP 119/74 | HR 87 | Ht 62.0 in | Wt 120.0 lb

## 2020-04-14 DIAGNOSIS — Z1231 Encounter for screening mammogram for malignant neoplasm of breast: Secondary | ICD-10-CM

## 2020-04-14 DIAGNOSIS — R5383 Other fatigue: Secondary | ICD-10-CM

## 2020-04-14 DIAGNOSIS — Z1159 Encounter for screening for other viral diseases: Secondary | ICD-10-CM

## 2020-04-14 NOTE — Progress Notes (Signed)
Subjective:  Patient ID: Crystal [name], female    DOB: December 29, 1970  Age: 49 y.o. MRN: [id number]  CC: Fatigue   HPI Crystal [name] is a 49 year old woman with a history of GERD, Stage 1B cervical cancer status post robotic-assisted type III radical laparoscopic hysterectomy with bilateral salpingectomy and bilateral sentinel lymph node biopsy in 06/2015 (no residual carcinoma as per GYN ) here for a follow-up visit. Complains of several month h/o fatigue that even speaking makes her tired She gets enough sleep at night. Denies presence of hot flashes. Review of her notes indicates normal hemoglobin in 08/2019, normal thyroid panel in 2019 and vitamin D deficiency in 2019. She had a visit with GYN in 02/2020 for several months post cervical cancer and notes indicate surveillance will continue until 06/2020 and she will need annual vaginal Pap smears with HPV cotesting. Past Medical History:  Diagnosis Date  . Allergy   . Cancer ([place])    cervical cancer dx.   . History of kidney stones    x1   . Kidney stones    cyst on kidney -no problemsm hx of kidney stones  . 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 PONV (postoperative nausea and vomiting)     Past Surgical History:  Procedure Laterality Date  . BREAST BIOPSY Left 08/28/2015   MRI- High Risk  . BREAST BIOPSY Right 08/28/2015   MRI- Benign  . BREAST EXCISIONAL BIOPSY Left 10/03/2015  . BREAST LUMPECTOMY WITH RADIOACTIVE SEED LOCALIZATION Left 10/03/2015   Procedure: LEFT BREAST LUMPECTOMY WITH RADIOACTIVE SEED LOCALIZATION;  Surgeon: Fanny Skates, MD;  Location: [facility];  Service: General;  Laterality: Left;  . CERVICAL BIOPSY  W/ LOOP ELECTRODE EXCISION     12'16  . CESAREAN SECTION     2 previous  . ROBOTIC ASSISTED TOTAL HYSTERECTOMY Bilateral 07/01/2015   Procedure: XI ROBOTIC ASSISTED RADICAL TYPE II HYSTERECTOMY, BILATERAL SALPINGECTOMY,  SENTINAL LYMPH NODE BIOPSY;  Surgeon: Everitt Amber, MD;  [facility];  Service: Gynecology;  Laterality: Bilateral;    Family History  Problem Relation Age of Onset  . Hypertension Sister   . Colon cancer Neg Hx   . Esophageal cancer Neg Hx   . Rectal cancer Neg Hx   . Stomach cancer Neg Hx     Allergies  Allergen Reactions  . Contrast Media [Iodinated Diagnostic Agents] Itching    13 hr prep in the future; itching of tongue and around the mouth 03/02/2019    Outpatient Medications Prior to Visit  Medication Sig Dispense Refill  . acetaminophen (TYLENOL) 500 MG tablet Take 1,000 mg by mouth every 4 (four) hours as needed.    . cyclobenzaprine (FLEXERIL) 10 MG tablet Take 1 tablet (10 mg total) by mouth at bedtime. 30 tablet 0  . polyethylene glycol powder (GLYCOLAX/MIRALAX) 17 GM/SCOOP powder Take 17 g by mouth daily. 3350 g 1  . naproxen (NAPROSYN) 500 MG tablet Take 1 tablet (500 mg total) by mouth 2 (two) times daily with a meal. (Patient not taking: Reported on 04/14/2020) 60 tablet 0   No facility-administered medications prior to visit.     ROS Review of Systems  Constitutional: Positive for fatigue. Negative for activity change and appetite change.  HENT: Negative for congestion, sinus pressure and sore throat.   Eyes: Negative for visual disturbance.  Respiratory: Negative for cough, chest tightness, shortness of breath and wheezing.   Cardiovascular: Negative for  chest pain and palpitations.  Gastrointestinal: Negative for abdominal distention, abdominal pain and constipation.  Endocrine: Negative for polydipsia.  Genitourinary: Negative for dysuria and frequency.  Musculoskeletal: Negative for arthralgias and back pain.  Skin: Negative for rash.  Neurological: Negative for tremors, light-headedness and numbness.  Hematological: Does not bruise/bleed easily.  Psychiatric/Behavioral: Negative for agitation and behavioral problems.    Objective:  BP 119/74    Pulse 87   Ht 5\' 2"  (1.575 m)   Wt 120 lb (54.4 kg)   LMP 06/07/2015 (Exact Date)   SpO2 98%   BMI 21.95 kg/m   BP/Weight 04/14/2020 03/20/2020 41/32/4401  Systolic BP 027 253 664  Diastolic BP 74 68 56  Wt. (Lbs) 120 121.2 120.8  BMI 21.95 22.17 22.09      Physical Exam Constitutional:      Appearance: She is well-developed.  Neck:     Vascular: No JVD.  Cardiovascular:     Rate and Rhythm: Normal rate.     Heart sounds: Normal heart sounds. No murmur heard.   Pulmonary:     Effort: Pulmonary effort is normal.     Breath sounds: Normal breath sounds. No wheezing or rales.  Chest:     Chest wall: No tenderness.  Abdominal:     General: Bowel sounds are normal. There is no distension.     Palpations: Abdomen is soft. There is no mass.     Tenderness: There is no abdominal tenderness.  Musculoskeletal:        General: Normal range of motion.     Right lower leg: No edema.     Left lower leg: No edema.  Neurological:     Mental Status: She is alert and oriented to person, place, and time.  Psychiatric:        Mood and Affect: Mood normal.     CMP Latest Ref Rng & Units 08/08/2019 02/19/2019 03/15/2018  Glucose 65 - 99 mg/dL 102(H) 102(H) 92  BUN 6 - 24 mg/dL 11 18 12   Creatinine 0.57 - 1.00 mg/dL 0.95 1.00 0.73  Sodium 134 - 144 mmol/L 139 141 139  Potassium 3.5 - 5.2 mmol/L 4.0 4.1 3.9  Chloride 96 - 106 mmol/L 105 104 104  CO2 20 - 29 mmol/L 22 22 21   Calcium 8.7 - 10.2 mg/dL 9.7 9.6 9.2  Total Protein 6.0 - 8.5 g/dL 7.8 - 7.2  Total Bilirubin 0.0 - 1.2 mg/dL 0.5 - 0.4  Alkaline Phos 39 - 117 IU/L 76 - 66  AST 0 - 40 IU/L 18 - 15  ALT 0 - 32 IU/L 24 - 15    Lipid Panel     Component Value Date/Time   CHOL 181 11/03/2015 1017   TRIG 97 11/03/2015 1017   HDL 57 11/03/2015 1017   CHOLHDL 3.2 11/03/2015 1017   VLDL 19 11/03/2015 1017   LDLCALC 105 11/03/2015 1017    CBC    Component Value Date/Time   WBC 8.8 08/08/2019 1702   WBC 7.1 08/09/2017  0925   RBC 4.67 08/08/2019 1702   RBC 4.43 08/09/2017 0925   HGB 15.5 08/08/2019 1702   HGB 15.0 07/08/2015 1441   HCT 44.6 08/08/2019 1702   HCT 45.5 07/08/2015 1441   PLT 257 08/08/2019 1702   MCV 96 08/08/2019 1702   MCV 96.1 07/08/2015 1441   MCH 33.2 (H) 08/08/2019 1702   MCH 32.7 08/09/2017 0925   MCHC 34.8 08/08/2019 1702   MCHC 33.8 08/09/2017 0925  RDW 11.9 08/08/2019 1702   RDW 12.4 07/08/2015 1441   LYMPHSABS 2.0 08/08/2019 1702   LYMPHSABS 1.1 07/08/2015 1441   MONOABS 486 04/07/2016 1202   MONOABS 0.6 07/08/2015 1441   EOSABS 0.2 08/08/2019 1702   BASOSABS 0.0 08/08/2019 1702   BASOSABS 0.1 07/08/2015 1441    Lab Results  Component Value Date   HGBA1C 5.5 04/04/2015    Assessment & Plan:  1. Other fatigue Discussed management of chronic fatigue including beginning a regular exercise regimen, yoga - TSH - T4, free - VITAMIN D 25 Hydroxy (Vit-D Deficiency, Fractures)  2. Need for hepatitis C screening test - HCV RNA quant rflx ultra or genotyp([facility])  3. Encounter for screening mammogram for malignant neoplasm of breast - MM 3D SCREEN BREAST BILATERAL; Future   Health Care Maintenance: Colonoscopy due in 02/2023 No orders of the defined types were placed in this encounter.   Follow-up: Return in about 6 months (around 10/12/2020) for Chronic disease management.       Charlott Rakes, MD, FAAFP. [facility] and [place], [place]   04/14/2020, 3:20 PM

## 2020-04-14 NOTE — Progress Notes (Signed)
Pt states that she has been having symptoms of fatigue for several months.

## 2020-04-14 NOTE — Patient Instructions (Signed)
Fatiga Fatigue Si siente fatiga, tiene una sensacin de cansancio en todo momento y falta de energa o falta de motivacin. La fatiga puede dificultar el inicio o la realizacin de tareas debido al agotamiento. Por lo general, la fatiga ocasional o leve con frecuencia es una reaccin normal a la actividad o la vida. Sin embargo, la fatiga de larga duracin (crnica) o extrema puede ser sntoma de una enfermedad. Siga estas indicaciones en su casa: Instrucciones generales  Controle su fatiga para ver si hay cambios.  Acustese y levntese a la misma hora todos los das.  Evite la fatiga moderando su ritmo durante el da y durmiendo lo suficiente durante la noche.  Mantenga un peso saludable. Medicamentos  Tome los medicamentos de venta libre y los recetados solamente como se lo haya indicado el mdico.  Tome una multivitamina, si se lo indic el mdico.  No tome ningn complemento alimenticio o a base de hierbas a menos que lo haya autorizado el mdico. Actividad   Realice ejercicio con regularidad como se lo haya indicado el mdico.  Practique tcnicas que lo ayuden a relajarse, como yoga, tai chi, meditacin, terapia de masajes. Comida y bebida   Evite las comidas abundantes a la noche.  Consuma una dieta bien balanceada que incluya protenas magras, cereales integrales, abundantes frutas, verduras y productos lcteos descremados.  Evite el consumo excesivo de cafena.  Evite el consumo de alcohol.  Beba suficiente lquido para mantener la orina de color amarillo plido. Estilo de vida  Cambie las situaciones que le provocan estrs. Trate de que su horario de trabajo y personal estn equilibrados.  No consuma ningn producto que contenga nicotina o tabaco, como cigarrillos y cigarrillos electrnicos. Si necesita ayuda para dejar de fumar, consulte al mdico.  No consuma drogas. Comunquese con un mdico si:  La fatiga no mejora.  Tiene fiebre.  Pierde peso o  aumenta de peso de forma repentina.  Tiene dolores de cabeza.  Tiene problemas para conciliar el sueo o para dormir en la noche.  Se siente enojado, culpable, ansioso o triste.  No puede defecar (estreimiento).  Tiene la piel seca.  Observa hinchazn en las piernas u otras partes del cuerpo. Solicite ayuda de inmediato si:  Se siente confundido.  Tiene visin borrosa.  Tiene mareos o se desmaya.  Tiene un dolor de cabeza intenso.  Siente dolor intenso en el abdomen, la espalda o el rea entre la cintura y las caderas (pelvis).  Tiene dolor de pecho, dificultad para respirar, o latidos cardacos irregulares o acelerados.  No puede orinar u orina menos de lo normal.  Presenta sangrado anormal, como sangrado del recto, la vagina, la nariz, los pulmones o los pezones.  Vomita sangre.  Tiene pensamientos acerca de lastimarse a usted mismo o a otras personas. Si alguna vez siente que puede lastimarse o lastimar a los dems, o tiene pensamientos de poner fin a su vida, busque ayuda de inmediato. Puede dirigirse al servicio de emergencias ms cercano o comunicarse con:  El servicio de emergencias local (911 en los [place]).  Una lnea de asistencia al suicida y atencin en crisis, como la Lnea Nacional de Prevencin del Suicidio (National Suicide Prevention Lifeline) al 1-800-273-8255. Est disponible las 24 horas del da. Resumen  Si siente fatiga, tiene una sensacin de cansancio en todo momento y falta de energa o falta de motivacin.  La fatiga puede dificultar el inicio o la realizacin de tareas debido al agotamiento.  La fatiga de larga duracin (  crnica) o extrema puede ser sntoma de una enfermedad.  Realice ejercicio con regularidad como se lo haya indicado el mdico.  Cambie las situaciones que le provocan estrs. Trate de que su horario de trabajo y personal estn equilibrados. Esta informacin no tiene como fin reemplazar el consejo del mdico.  Asegrese de hacerle al mdico cualquier pregunta que tenga. Document Revised: 04/27/2017 Document Reviewed: 04/27/2017 Elsevier Patient Education  2020 Elsevier Inc.  

## 2020-04-15 LAB — TSH: TSH: 1.37 u[IU]/mL (ref 0.450–4.500)

## 2020-04-15 LAB — VITAMIN D 25 HYDROXY (VIT D DEFICIENCY, FRACTURES): Vit D, 25-Hydroxy: 20.4 ng/mL — ABNORMAL LOW (ref 30.0–100.0)

## 2020-04-15 LAB — HCV RNA QUANT RFLX ULTRA OR GENOTYP: HCV Quant Baseline: NOT DETECTED IU/mL

## 2020-04-15 LAB — T4, FREE: Free T4: 1.55 ng/dL (ref 0.82–1.77)

## 2020-04-16 ENCOUNTER — Other Ambulatory Visit: Payer: Self-pay | Admitting: Family Medicine

## 2020-04-16 ENCOUNTER — Telehealth: Payer: Self-pay

## 2020-04-16 MED ORDER — ERGOCALCIFEROL 1.25 MG (50000 UT) PO CAPS: 50000.0000 [IU] | ORAL_CAPSULE | ORAL | 0 refills | Status: DC

## 2020-04-16 MED FILL — VIT D2 1.25 MG (50,000 UNIT: 1.25 MG | 84 days supply | Qty: 12 | Fill #0

## 2020-04-16 NOTE — Telephone Encounter (Signed)
-----   Message from Charlott Rakes, MD sent at 04/16/2020  8:44 AM EST ----- Vitamin D level is low which can explain her fatigue.  I have sent a prescription to her pharmacy for 12 weeks after which she will need to continue with over-the-counter vitamin D 1000 units daily.  Other labs are normal

## 2020-04-16 NOTE — Telephone Encounter (Signed)
Patient name and DOB has been verified Patient was informed of lab results. Patient had no questions.  

## 2020-06-24 ENCOUNTER — Telehealth: Payer: Self-pay | Admitting: *Deleted

## 2020-06-24 NOTE — Telephone Encounter (Signed)
Patient's daughter called and scheduled the patient for a follow up in April

## 2020-07-21 ENCOUNTER — Telehealth: Payer: Self-pay | Admitting: Family Medicine

## 2020-07-21 DIAGNOSIS — M545 Low back pain, unspecified: Secondary | ICD-10-CM

## 2020-07-21 NOTE — Telephone Encounter (Signed)
1) Medication(s) Requested (by name): cyclobenzaprine (FLEXERIL) 10 MG tablet    2) Pharmacy of Choice:  [facility]  3) Special Requests:   Approved medications will be sent to the pharmacy, we will reach out if there is an issue.  Requests made after 3pm may not be addressed until the following business day!  If a patient is unsure of the name of the medication(s) please note and ask patient to call back when they are able to provide all info, do not send to responsible party until all information is available!

## 2020-07-22 ENCOUNTER — Other Ambulatory Visit: Payer: Self-pay | Admitting: Family Medicine

## 2020-07-22 MED ORDER — CYCLOBENZAPRINE HCL 10 MG PO TABS: 10.0000 mg | ORAL_TABLET | Freq: Every day | ORAL | 1 refills | Status: DC

## 2020-07-22 MED FILL — CYCLOBENZAPRINE 10 MG TAB: 10 | 30 days supply | Qty: 30 | Fill #0

## 2020-07-22 NOTE — Telephone Encounter (Signed)
Rx sent 

## 2020-08-01 IMAGING — DX DG FOOT 2V*L*
2 series · 2 of 2 positions shown · non-contrast
Comparison: None.

CLINICAL DATA: Left foot pain

EXAM:
LEFT FOOT - 2 VIEW

[foot ap]
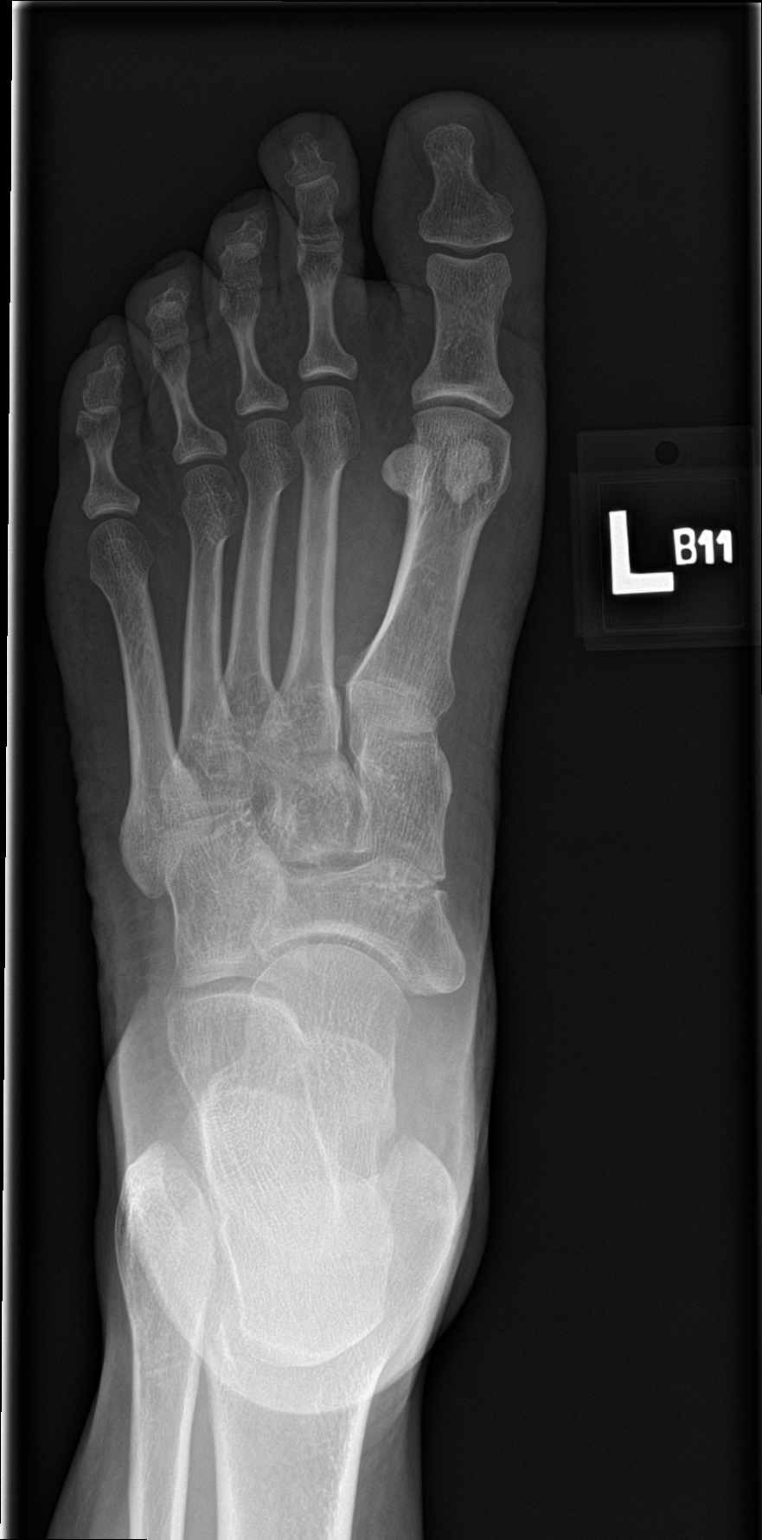

[foot lat]
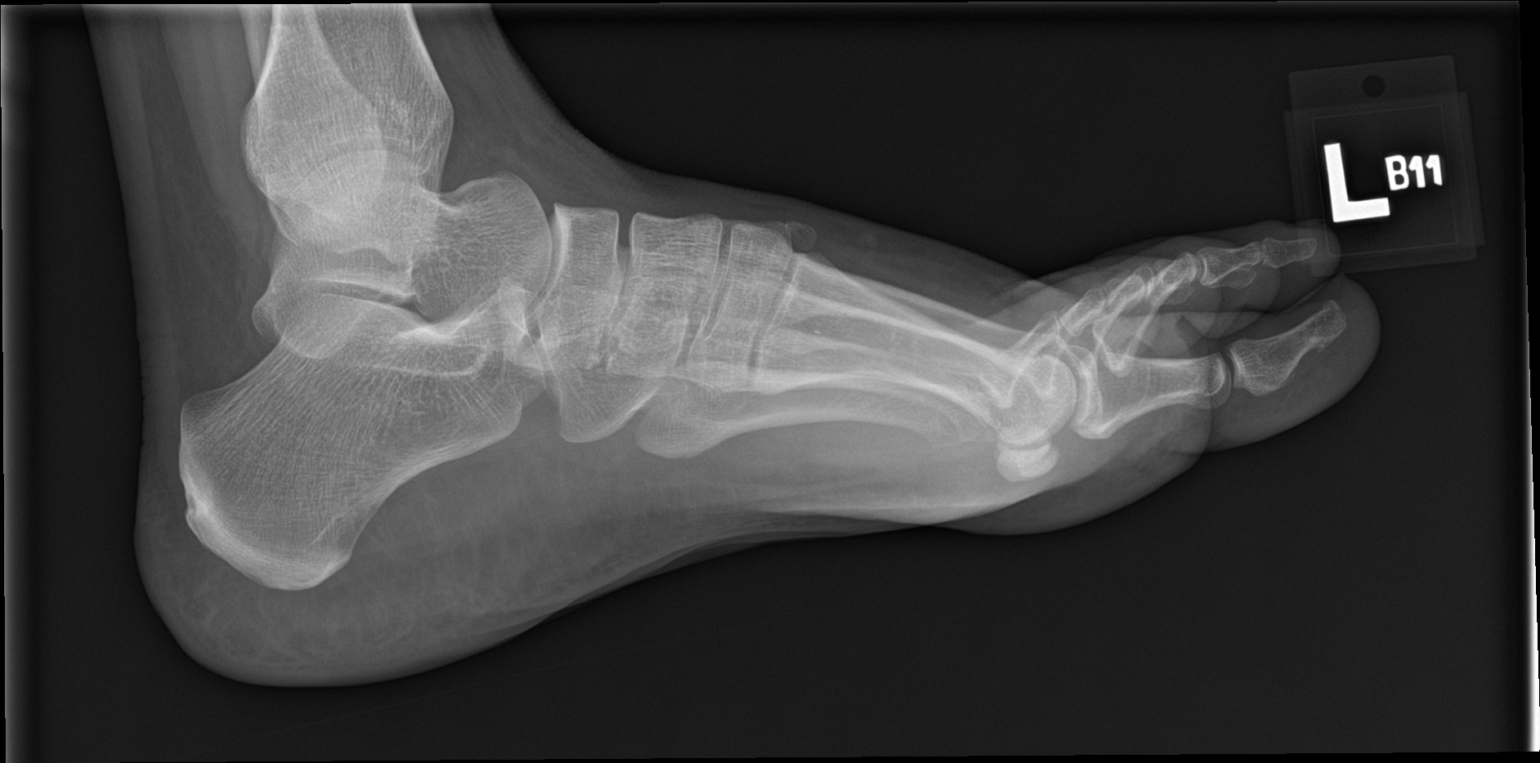

[2 of 2 positions shown; findings below may reference images not displayed]

FINDINGS: No fracture or malalignment. Joint spaces are maintained. No
radiopaque foreign body in the soft tissues
IMPRESSION: No acute osseous abnormality.

## 2020-08-13 ENCOUNTER — Ambulatory Visit: Payer: Self-pay | Attending: Family Medicine

## 2020-08-13 ENCOUNTER — Other Ambulatory Visit: Payer: Self-pay

## 2020-09-01 ENCOUNTER — Inpatient Hospital Stay: Payer: Self-pay | Attending: Gynecologic Oncology | Admitting: Gynecologic Oncology

## 2020-09-01 ENCOUNTER — Encounter: Payer: Self-pay | Admitting: Gynecologic Oncology

## 2020-09-01 ENCOUNTER — Other Ambulatory Visit: Payer: Self-pay

## 2020-09-01 VITALS — BP 122/76 | HR 97 | Temp 97.6°F | Resp 17 | Ht 62.0 in | Wt 120.4 lb

## 2020-09-01 DIAGNOSIS — Z90722 Acquired absence of ovaries, bilateral: Secondary | ICD-10-CM | POA: Insufficient documentation

## 2020-09-01 DIAGNOSIS — C539 Malignant neoplasm of cervix uteri, unspecified: Secondary | ICD-10-CM

## 2020-09-01 DIAGNOSIS — Z8541 Personal history of malignant neoplasm of cervix uteri: Secondary | ICD-10-CM | POA: Insufficient documentation

## 2020-09-01 DIAGNOSIS — Z9079 Acquired absence of other genital organ(s): Secondary | ICD-10-CM | POA: Insufficient documentation

## 2020-09-01 DIAGNOSIS — C538 Malignant neoplasm of overlapping sites of cervix uteri: Secondary | ICD-10-CM

## 2020-09-01 DIAGNOSIS — Z90710 Acquired absence of both cervix and uterus: Secondary | ICD-10-CM | POA: Insufficient documentation

## 2020-09-01 NOTE — Progress Notes (Signed)
Follow Up Note: Gyn-Onc  Crystal [name] 50 y.o. female  CC:  Chief Complaint  Patient presents with  . Malignant neoplasm of overlapping sites of cervix [facility])   Assessment/Plan:  50 year old s/p robotic-assisted type III radical laparoscopic hysterectomy with bilateral salpingectomy and bilateral sentinel lymph node biopsy on 07/01/15 for stage IB1 cervical SCC.  No residual carcinoma on hysterectomy specimen. Low risk features to her tumor therefore no adjuvant therapy recommended. No evidence for recurrence on today's exam.  1/ annual pap surveillance for this with hpv cotesting - will follow-up this result and notify patient.  2/ I discussed symptoms concerning for recurrence including vaginal bleeding or discharge, pelvic pain, cough, or lower extremity edema.  3/ She has completed her cancer surveillance but should continue to get vaginal cytology and screening q 5 years (if HPV negative) and can do so with her general OBGYN, Dr Harolyn Rutherford.    HPI: Crystal [name] is a 50 year old female initially referred by Dr. 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  She had her first abnormal Pap smear on 02/26/2015 which was HGSIL. She then underwent colposcopic evaluation of the cervix on 04/02/2015, which was benign in appearance. A biopsy from the ectocervix was benign and endocervical curetting showed CIN-3. As follow-up, she underwent a LEEP procedure on 05/12/2015. 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 Dimensions of the carcinoma were not included however there was a focus of dysplasia suspicious for invasive carcinoma involving the deep margin.  PET on 06/20/15: IMPRESSION: 1. No primary hypermetabolic primary cervical mass or evidence of nodal metastasis. 2. Focal hypermetabolism in the lateral left breast. No CT correlate. Consider correlation with diagnostic mammography  and ultrasound to exclude otherwise occult breast lesion. 3. Probable left nephrolithiasis  On 07/01/15, she underwent a robotic-assisted type III radical laparoscopic hysterectomy with bilateral salpingoophorectomy and bilateral pelvic lymphadenectomy by Dr. Denman George.  Final pathology revealed: Diagnosis 1. Uterus +/- tubes/ovaries, neoplastic, cervix CERVIX WITH LOW GRADE SQUAMOUS INTRAEPITHELIAL LESION, CIN-1 NO RESIDUAL HIGH GRADE INTRAEPITHELIAL LESION OR INVASIVE NEOPLASM IDENTIFIED VAGINAL CUFF, PARAMETRIAL AND ALL RESECTION MARGINS ARE NEGATIVE FOR TUMOR ENDOMETRIAL POLYP AND INACTIVE ENDOMETRIUM MYOMETRIUM: NO PATHOLOGICAL ALTERATIONS BILATERAL FALLOPIAN TUBES: HISTOLOGICAL UNREMARKABLE 2. Lymph node, sentinel, biopsy, right external iliac ONE BENIGN LYMPH NODE (0/1) 3. Lymph node, sentinel, biopsy, left obturator ONE BENIGN LYMPH NODE (0/1)  She was re-admitted to the hospital on POD 7, 07/08/15, for persistent nausea, vomiting, dehydration, no BM, dyspnea.  CT CAP on 07/08/15 failed to demonstrate evidence for intraperitoneal abscess/GU or GI injury or pathology including no obstruction. UA and culture revealed a catheter associated UTI (e coli - pan sensitive) which was treated with cipro. She was discharged home on 07/10/15 after IV hydration, aggressive bowel regimen.  She failed a voiding trial during her hospitalization and the foley had to be replaced due to urinary retention.  She presented to the office on 07/15/15 for a repeat voiding trial. She passed this well and her foley was left out, however she subsequently developed urinary retention and required teaching to intermittently self catheterize.  On 10/03/2015 she underwent a breast lumpectomy on the left which confirmed lobular hyperplasia but no carcinoma in situ identified. No additional therapy is required for this.  She was worked up for epigastric pain by GI in December, 2018 with an EGD which confirmed GERD.  She had  CT abd/pelvis in March,  2019 for flank pain which showed no evidence of recurrent cancer.   She reported right flank pain in October, 2019. CT abd/pelvis on 03/21/18 showed no evidence of recurrence.  There was an acquired right renal cyst measuring 9 mm that was simple in appearance and unchanged.  The right adnexa contained a 2.2 x 2.7 cm cyst.  No free fluid or signs of malignancy were identified.  When she was seen in October, 2020 she reports that she has had 2 weeks of mid abdominal, periumbilical, and lower pelvic abdominal pains.  A CT abd/pelvis on March 02, 2019 revealed no evidence of malignancy and the course of the patient's abdominal pain was not identified.  Interval History:   She reported a 17-month history of pelvic pain and March 2021 and a transvaginal ultrasound scan was ordered and performed on August 15, 2019.  This revealed a surgically absent uterus, a right ovary measuring 2.5 x 1.4 x 1.9 cm with a dominant follicular cyst without a mass.  The left ovary was not visualized and likely obscured by bowel.  There is no free fluid or masses noted on ultrasound.  She had abdominal pain reported in April, 2021 and CT imaging was performed that showed no pathology to explain this and no evidence for recurrence.   Pap in October, 2021 was negative for dysplasia and negative for high risk HPV.   Review of Systems  Constitutional: Feels well.  No fever, chills, early satiety, or unintentional weight loss or gain.  Cardiovascular: No chest pain, shortness of breath, or edema.  Pulmonary: coughs up blood Gastrointestinal: No nausea, vomiting, or diarrhea. No bright red blood per rectum or change in bowel movement.  Genitourinary: Minimal bladder sensation. No vaginal bleeding or discharge.  Musculoskeletal: No myalgia or joint pain. Neurologic: No weakness, numbness, or change in gait.  Psychology: No depression, anxiety, or insomnia.  Current Meds:  Outpatient Encounter  Medications as of 09/01/2020  Medication Sig  . acetaminophen (TYLENOL) 500 MG tablet Take 1,000 mg by mouth every 4 (four) hours as needed. (Patient not taking: Reported on 09/01/2020)  . cyclobenzaprine (FLEXERIL) 10 MG tablet TAKE 1 TABLET (10 MG TOTAL) BY MOUTH AT BEDTIME. (Patient not taking: Reported on 09/01/2020)  . ergocalciferol (DRISDOL) 1.25 MG (50000 UT) capsule Take 1 capsule (50,000 Units total) by mouth once a week. (Patient not taking: Reported on 09/01/2020)  . naproxen (NAPROSYN) 500 MG tablet Take 1 tablet (500 mg total) by mouth 2 (two) times daily with a meal. (Patient not taking: No sig reported)  . polyethylene glycol powder (GLYCOLAX/MIRALAX) 17 GM/SCOOP powder Take 17 g by mouth daily. (Patient not taking: Reported on 09/01/2020)  . Vitamin D, Ergocalciferol, (DRISDOL) 1.25 MG (50000 UNIT) CAPS capsule TAKE 1 CAPSULE BY MOUTH ONCE A WEEK. (Patient not taking: Reported on 09/01/2020)   No facility-administered encounter medications on file as of 09/01/2020.    Allergy:  Allergies  Allergen Reactions  . Contrast Media [Iodinated Diagnostic Agents] Itching    13 hr prep in the future; itching of tongue and around the mouth 03/02/2019    Social Hx:   Social History   Socioeconomic History  . Marital status: Widowed    Spouse name: Not on file  . Number of children: 2  . Years of education: Not on file  . Highest education level: Not on file  Occupational History  . Not on file  Tobacco Use  . Smoking status: Never Smoker  . Smokeless tobacco: Never Used  Vaping Use  . Vaping Use: Never used  Substance and Sexual Activity  . Alcohol use: Yes    Comment: occasionally  . Drug use: No  . Sexual activity: Not Currently    Birth control/protection: Surgical  Other Topics Concern  . Not on file  Social History Narrative  . Not on file   Social Determinants of Health   Financial Resource Strain: Not on file  Food Insecurity: Not on file  Transportation Needs:  Not on file  Physical Activity: Not on file  Stress: Not on file  Social Connections: Not on file  Intimate Partner Violence: Not on file    Past Surgical Hx:  Past Surgical History:  Procedure Laterality Date  . BREAST BIOPSY Left 08/28/2015   MRI- High Risk  . BREAST BIOPSY Right 08/28/2015   MRI- Benign  . BREAST EXCISIONAL BIOPSY Left 10/03/2015  . BREAST LUMPECTOMY WITH RADIOACTIVE SEED LOCALIZATION Left 10/03/2015   Procedure: LEFT BREAST LUMPECTOMY WITH RADIOACTIVE SEED LOCALIZATION;  Surgeon: Fanny Skates, MD;  Location: [facility];  Service: General;  Laterality: Left;  . CERVICAL BIOPSY  W/ LOOP ELECTRODE EXCISION     12'16  . CESAREAN SECTION     2 previous  . ROBOTIC ASSISTED TOTAL HYSTERECTOMY Bilateral 07/01/2015   Procedure: XI ROBOTIC ASSISTED RADICAL TYPE II HYSTERECTOMY, BILATERAL SALPINGECTOMY, SENTINAL LYMPH NODE BIOPSY;  Surgeon: Everitt Amber, MD;  Location: [facility];  Service: Gynecology;  Laterality: Bilateral;    Past Medical Hx:  Past Medical History:  Diagnosis Date  . Allergy   . Cancer ([place])    cervical cancer dx.   . History of kidney stones    x1   . Kidney stones    cyst on kidney -no problemsm hx of kidney stones  . 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 PONV (postoperative nausea and vomiting)     Family Hx:  Family History  Problem Relation Age of Onset  . Hypertension Sister   . Colon cancer Neg Hx   . Esophageal cancer Neg Hx   . Rectal cancer Neg Hx   . Stomach cancer Neg Hx     Vitals:  Blood pressure 122/76, pulse 97, temperature 97.6 F (36.4 C), temperature source Tympanic, resp. rate 17, height 5\' 2"  (1.575 m), weight 120 lb 6.4 oz (54.6 kg), last menstrual period 06/07/2015, SpO2 99 %.  Physical Exam:  General: Well developed, well nourished female in no acute distress. Alert and oriented x 3.  Cardiovascular: Regular rate and rhythm. S1 and S2 normal.  Lungs: Clear to  auscultation bilaterally. No wheezes/crackles/rhonchi noted.  Skin: No rashes or lesions present. Back: No CVA tenderness.  Abdomen: Abdomen soft,  and non-obese. Active bowel sounds in all quadrants. No evidence of a fluid wave or abdominal masses.  Lap sites soft. Extremities: No bilateral cyanosis, edema, or clubbing.  Pelvic exam: No residual granulation tissue at cuff. No nodulariy or lesions.  Rectal: smooth, no nodularity or masses   Thereasa Solo, MD  09/01/2020, 3:45 PM

## 2020-09-01 NOTE — Patient Instructions (Signed)
Congratulations! You have graduated from follow-up for your stage IB cervical cancer that was diagnosed in 2017 and treated with a radical hysterectomy. Your ovaries were not removed.   You have remained free of cancer for over 5 years and therefore do not require scheduled visits at the cancer center any more.  You can receive gynecologic care from your general gynecologist, Dr Harolyn Rutherford who sees patients at the [facility] on [address], [place].   If you have questions for Dr Denman George, her office can be reached at [phone].

## 2020-09-04 ENCOUNTER — Other Ambulatory Visit: Payer: Self-pay

## 2020-09-04 ENCOUNTER — Ambulatory Visit: Payer: Self-pay | Attending: Physician Assistant | Admitting: Physician Assistant

## 2020-09-04 DIAGNOSIS — G5601 Carpal tunnel syndrome, right upper limb: Secondary | ICD-10-CM

## 2020-09-04 DIAGNOSIS — Z789 Other specified health status: Secondary | ICD-10-CM

## 2020-09-04 NOTE — Patient Instructions (Signed)
Sndrome del tnel carpiano Carpal Tunnel Syndrome  El sndrome del tnel carpiano es una afeccin que causa dolor, debilidad y adormecimiento en la mano y los dedos. El adormecimiento es cuando no siente una zona del cuerpo. El tnel carpiano es un rea estrecha que se encuentra en el lado palmar de la [place]. Los movimientos repetidos de la mueca o determinadas enfermedades pueden causar hinchazn en el tnel. Esta hinchazn puede comprimir el nervio principal de la [place]. Este nervio se llama "nervio mediano". Cules son las causas? Esta afeccin puede ser causada por lo siguiente:  Mover la mano y la [place] y otra vez mientras realiza una tarea.  Lesin en la [place].  Artritis.  Un saco lleno de lquido (quiste) o un crecimiento anormal (tumor) en el tnel carpiano.  Acumulacin de lquido Solicitor.  Uso de herramientas que vibran. Algunas veces, la causa no se conoce. Qu incrementa el riesgo? Los siguientes factores pueden hacer que sea ms propenso a Best boy esta afeccin:  Tener un trabajo en el que deba hacer estas cosas: ? Mover la mano una y otra vez. ? Fish farm manager con herramientas que vibran, como taladros o lijadoras.  Ser mujer.  Tener diabetes, obesidad, problemas de tiroides o insuficiencia renal. Cules son los signos o sntomas? Los sntomas de esta afeccin incluyen:  Sensacin de hormigueo en los dedos.  Hormigueo o prdida de la sensibilidad de Insurance risk surveyor.  Dolor en todo el brazo. Este dolor puede empeorar al flexionar la [place] y el codo durante [place].  Dolor en la mueca que sube por el brazo hasta el hombro.  Dolor que baja hasta la palma de la mano o los dedos.  Debilidad en las manos. Puede resultarle difcil tomar y Licensed conveyancer. Es posible que se sienta peor por la noche. Cmo se trata? El tratamiento de esta afeccin puede incluir:  Cambios en el estilo de vida. Se le pedir que deje o cambie la actividad que caus el  problema.  Hacer ejercicios y actividades para que los [place], los msculos y los tendones se vuelvan ms fuertes (fisioterapia).  Aprender a Control and instrumentation engineer nuevamente (terapia ocupacional).  Medicamentos para Conservation officer, historic buildings y la hinchazn. Es posible que le apliquen inyecciones en la [place].  Una frula o un dispositivo ortopdico para la [place].  Ciruga. Siga estas instrucciones en su casa: Si tiene una frula o un dispositivo ortopdico:  Use la frula o el dispositivo ortopdico como se lo haya indicado el mdico. Quteselos solamente como se lo haya indicado el mdico.  Afloje la frula si los dedos: ? Hormiguean. ? Se adormecen. ? Se tornan fros y de [employer].  Mantenga la frula o el dispositivo ortopdico limpios.  Si la frula o el dispositivo ortopdico no son impermeables: ? No deje que se mojen. ? Cbralos con un envoltorio hermtico cuando tome un bao de inmersin o una ducha. Control del dolor, la rigidez y la hinchazn Si se lo indican, aplique hielo sobre la zona dolorida:  Si tiene un dispositivo ortopdico o una frula desmontable, quteselos como se lo haya indicado el mdico.  Ponga el hielo en una bolsa plstica.  Coloque una toalla entre la piel y Therapist, nutritional.  Coloque el hielo durante 9minutos, 2a3veces al da. No se quede dormido con la bolsa de hielo [employer].  Retire el hielo si la piel se le pone de color rojo brillante. Esto es [employer]. Si no puede sentir dolor, calor o fro, tiene  un mayor riesgo de que se dae la zona. Mueva los dedos con frecuencia para reducir la rigidez y la hinchazn.   Instrucciones generales  [employer] de venta libre y los recetados solamente como se lo haya indicado el mdico.  Descanse la [place] de cualquier actividad que le cause dolor. Si es necesario, hable con su jefe en el [employer] cambios que pueden ayudar a la curacin de la [place].  Haga los ejercicios que le hayan indicado el  mdico, el fisioterapeuta o el terapeuta ocupacional.  Cumpla con todas las visitas de seguimiento. Comunquese con un mdico si:  Aparecen nuevos sntomas.  Los medicamentos no Forensic psychologist.  Sus sntomas empeoran. Solicite ayuda de inmediato si:  Tiene adormecimiento u hormigueo muy intensos en la mueca o la [place]. Resumen  El sndrome del tnel carpiano es una afeccin que causa dolor en la mano y en el brazo.  Suele deberse a movimientos repetidos de Advertising copywriter.  Este problema se trata mediante cambios en el estilo de vida y medicamentos. La ciruga puede ser necesaria en casos muy graves.  Siga las instrucciones del mdico sobre el uso de una frula, el reposo de la [place], la asistencia a las consultas de seguimiento y Solicitor para pedir ayuda. Esta informacin no tiene Marine scientist el consejo del mdico. Asegrese de hacerle al mdico cualquier pregunta que tenga. Document Revised: 10/26/2019 Document Reviewed: 10/26/2019 Elsevier Patient Education  [place].

## 2020-09-04 NOTE — Progress Notes (Signed)
Patient ID: Crystal [name], female   DOB: 08-Apr-1971, 50 y.o.   MRN: [id number] Virtual Visit via Telephone Note  I connected with Crystal [name] on 09/04/20 at  4:10 PM EDT by telephone and verified that I am speaking with the correct person using two identifiers.  Location: Patient: home Provider: [facility] office Audelia Hives with [place] interpreters   I discussed the limitations, risks, security and privacy concerns of performing an evaluation and management service by telephone and the availability of in person appointments. I also discussed with the patient that there may be a patient responsible charge related to this service. The patient expressed understanding and agreed to proceed.   History of Present Illness: Pain in R arm that goes into her neck.  2 months.  NKI. Worse at night.  No neck injury.  Paresthesias at night-she is unable to delineate exactly which fingers go numb.    Observations/Objective:  NAD.  A&Ox3   Assessment and Plan: 1. Carpal tunnel syndrome of right wrist Obtain wrist splint and esp use at night for sleep.  Tylenol/advil for discomfort.  - Ambulatory referral to Hand Surgery  2. Language barrier-pacific interpreters used and additional time performing visit was required.     Follow Up Instructions: See PCP in 4-6 months;  Sooner if needed   I discussed the assessment and treatment plan with the patient. The patient was provided an opportunity to ask questions and all were answered. The patient agreed with the plan and demonstrated an understanding of the instructions.   The patient was advised to call back or seek an in-person evaluation if the symptoms worsen or if the condition fails to improve as anticipated.  I provided 14 minutes of non-face-to-face time during this encounter.   Freeman Caldron, PA-C

## 2020-09-09 ENCOUNTER — Other Ambulatory Visit: Payer: Self-pay

## 2020-09-09 DIAGNOSIS — N644 Mastodynia: Secondary | ICD-10-CM

## 2020-09-16 ENCOUNTER — Encounter: Payer: Self-pay | Admitting: Orthopaedic Surgery

## 2020-09-16 ENCOUNTER — Ambulatory Visit (INDEPENDENT_AMBULATORY_CARE_PROVIDER_SITE_OTHER): Payer: Self-pay | Admitting: Orthopaedic Surgery

## 2020-09-16 ENCOUNTER — Other Ambulatory Visit: Payer: Self-pay

## 2020-09-16 DIAGNOSIS — M79641 Pain in right hand: Secondary | ICD-10-CM

## 2020-09-16 MED ORDER — PREDNISONE 10 MG PO TABS
ORAL_TABLET | ORAL | 0 refills | Status: DC
  Filled 2020-09-16: qty 21, 6d supply, fill #0

## 2020-09-16 NOTE — Progress Notes (Signed)
Office Visit Note   Patient: Crystal [name]           Date of Birth: 07/02/1970           MRN: [id number] Visit Date: 09/16/2020              Requested by: Crystal Donovan, PA-C [place],  [place] [address] PCP: Charlott Rakes, MD   Assessment & Plan: Visit Diagnoses:  1. Pain in right hand     Plan: Impression is probable right carpal tunnel syndrome.  We will order nerve conduction studies and have her follow-up after these have been completed.  In the meantime we will prescribe prednisone Dosepak and provide her with carpal tunnel brace to wear at nighttime.  Language barrier increased the complexity of the visit today.  Follow-Up Instructions: Return for Follow-up after nerve conduction studies.   Orders:  Orders Placed This Encounter  Procedures  . Ambulatory referral to Physical Medicine Rehab   Meds ordered this encounter  Medications  . predniSONE (DELTASONE) 10 MG tablet    Sig: take 6 tablets by mouth once on day 1 then decrease by 1 each day after (6-5-4-3-2-1)    Dispense:  21 tablet    Refill:  0      Procedures: No procedures performed   Clinical Data: No additional findings.   Subjective: Chief Complaint  Patient presents with  . Right Wrist - Pain    Crystal [name] comes in today with language interpreter for evaluation of suspected right carpal tunnel syndrome for about 2 and half months.  She now has pain numbness and tingling in her hand and fingers that radiate up into the shoulder.  She feels worse pain in the morning.  She is right-hand dominant.  She works as a Engineer, building services.  Denies any radicular symptoms.  She feels like her hands fall asleep with use.  She is dropping things from her hands.   Review of Systems  Constitutional: Negative.   HENT: Negative.   Eyes: Negative.   Respiratory: Negative.   Cardiovascular: Negative.   Endocrine: Negative.   Musculoskeletal: Negative.   Neurological: Negative.    Hematological: Negative.   Psychiatric/Behavioral: Negative.   All other systems reviewed and are negative.    Objective: Vital Signs: LMP 06/07/2015 (Exact Date)   Physical Exam Vitals and nursing note reviewed.  Constitutional:      Appearance: She is well-developed.  HENT:     Head: Normocephalic and atraumatic.  Pulmonary:     Effort: Pulmonary effort is normal.  Abdominal:     Palpations: Abdomen is soft.  Musculoskeletal:     Cervical back: Neck supple.  Skin:    General: Skin is warm.     Capillary Refill: Capillary refill takes less than 2 seconds.  Neurological:     Mental Status: She is alert and oriented to person, place, and time.  Psychiatric:        Behavior: Behavior normal.        Thought Content: Thought content normal.        Judgment: Judgment normal.     Ortho Exam Right hand shows a positive Durkin's and Phalen's and Tinel.  No muscle atrophy.  She is able to make a full composite fist.  Cervical spine range of motion produces moderate discomfort.  No focal motor or sensory deficits. Specialty Comments:  No specialty comments available.  Imaging: No results found.   PMFS History: Patient Active Problem List  Diagnosis Date Noted  . Chest pain 07/28/2017  . Hemoptysis 03/04/2017  . Gastritis 12/06/2016  . Herpes zoster without complication 78/[id number]/8502  . Depression 04/07/2016  . 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 Urinary retention with incomplete bladder emptying 07/23/2015  . Post-operative nausea and vomiting 07/08/2015  . Cervical cancer ([place]) 07/01/2015  . Malignant neoplasm of cervix ([place]) 06/02/2015  . 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 Renal mass, right 04/14/2015  . Periodic health assessment, general screening, adult 02/23/2013   Past Medical History:  Diagnosis Date  . Allergy   . Cancer ([place])    cervical cancer dx.   . History of kidney stones    x1   . Kidney stones    cyst on  kidney -no problemsm hx of kidney stones  . 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 PONV (postoperative nausea and vomiting)     Family History  Problem Relation Age of Onset  . Hypertension Sister   . Colon cancer Neg Hx   . Esophageal cancer Neg Hx   . Rectal cancer Neg Hx   . Stomach cancer Neg Hx     Past Surgical History:  Procedure Laterality Date  . BREAST BIOPSY Left 08/28/2015   MRI- High Risk  . BREAST BIOPSY Right 08/28/2015   MRI- Benign  . BREAST EXCISIONAL BIOPSY Left 10/03/2015  . BREAST LUMPECTOMY WITH RADIOACTIVE SEED LOCALIZATION Left 10/03/2015   Procedure: LEFT BREAST LUMPECTOMY WITH RADIOACTIVE SEED LOCALIZATION;  Surgeon: Fanny Skates, MD;  Location: [facility];  Service: General;  Laterality: Left;  . CERVICAL BIOPSY  W/ LOOP ELECTRODE EXCISION     12'16  . CESAREAN SECTION     2 previous  . ROBOTIC ASSISTED TOTAL HYSTERECTOMY Bilateral 07/01/2015   Procedure: XI ROBOTIC ASSISTED RADICAL TYPE II HYSTERECTOMY, BILATERAL SALPINGECTOMY, SENTINAL LYMPH NODE BIOPSY;  Surgeon: Everitt Amber, MD;  Location: [facility];  Service: Gynecology;  Laterality: Bilateral;   Social History   Occupational History  . Not on file  Tobacco Use  . Smoking status: Never Smoker  . Smokeless tobacco: Never Used  Vaping Use  . Vaping Use: Never used  Substance and Sexual Activity  . Alcohol use: Yes    Comment: occasionally  . Drug use: No  . Sexual activity: Not Currently    Birth control/protection: Surgical

## 2020-10-08 IMAGING — CT CT ABD-PELV W/ CM
2 of 5 series · 16 of 46 positions shown, 18 images · IV contrast (Omni 300)
Comparison: CT abdomen pelvis 08/09/2017

CLINICAL DATA: Acquired right renal cyst. Right upper quadrant pain

EXAM:
CT ABDOMEN AND PELVIS WITH CONTRAST
TECHNIQUE: Multidetector CT imaging of the abdomen and pelvis was performed
using the standard protocol following bolus administration of
intravenous contrast.
CONTRAST:  100mL OMNIPAQUE IOHEXOL 300 MG/ML  SOLN

[Series 3: a/p w/ 5mm · axial · 0.69mm/px · z∈[-328,+32]mm · 13 of 82 slices shown, 15 images]
[im 5/82  soft-tissue]
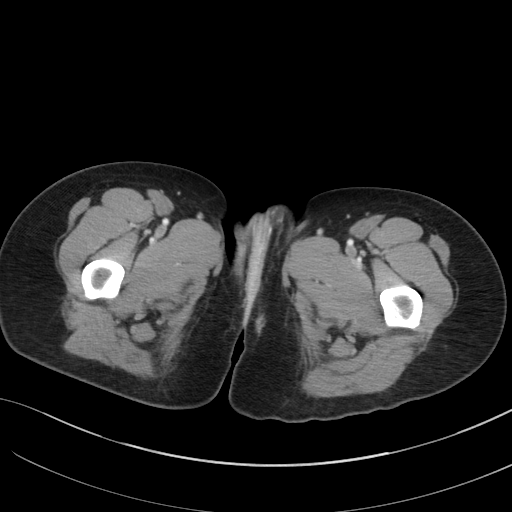
[im 5/82  bone]
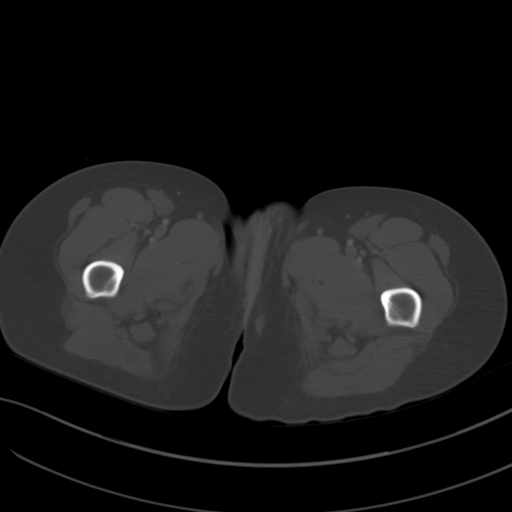
[im 10/82  soft-tissue]
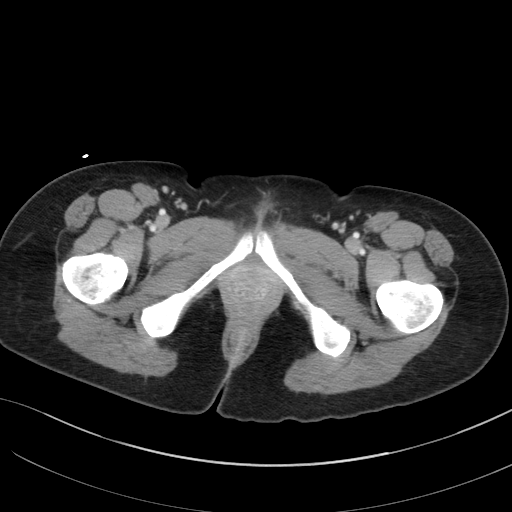
[im 19/82  soft-tissue]
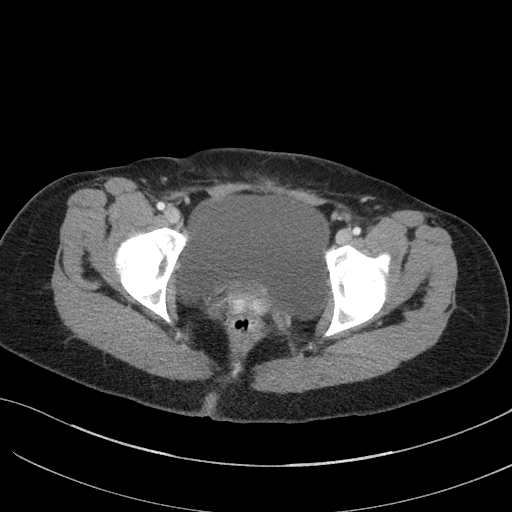
[im 23/82  soft-tissue]
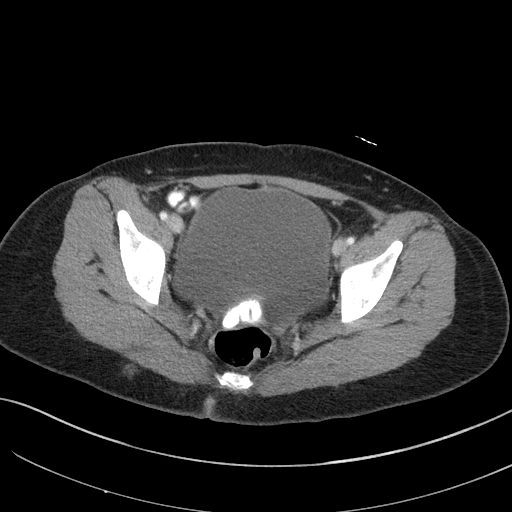
[im 28/82  soft-tissue]
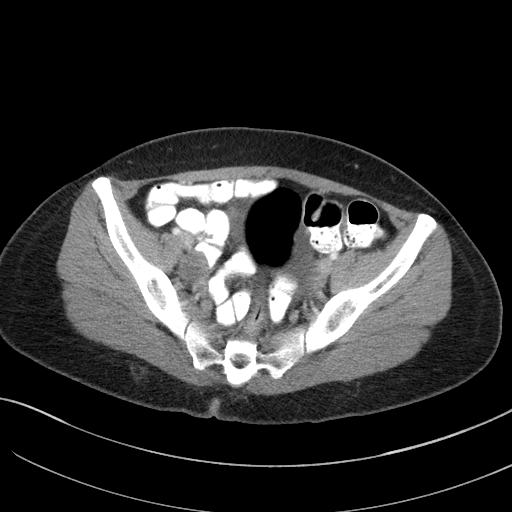
[im 37/82  soft-tissue]
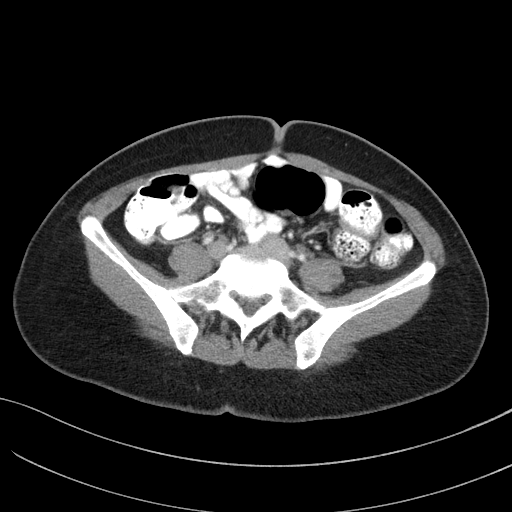
[im 41/82  soft-tissue]
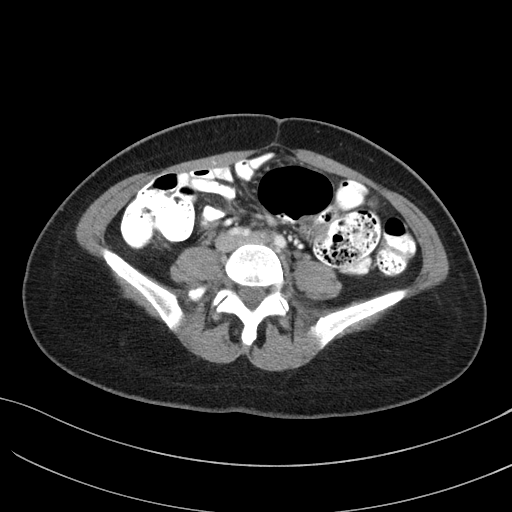
[im 46/82  soft-tissue]
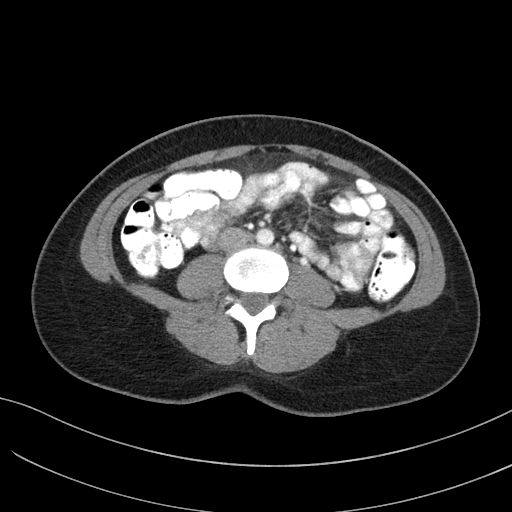
[im 55/82  soft-tissue]
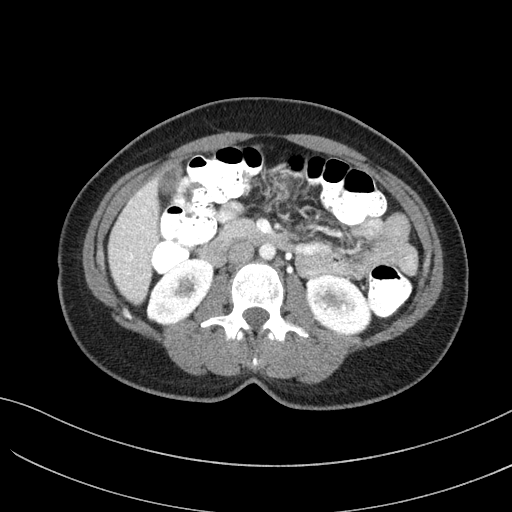
[im 55/82  bone]
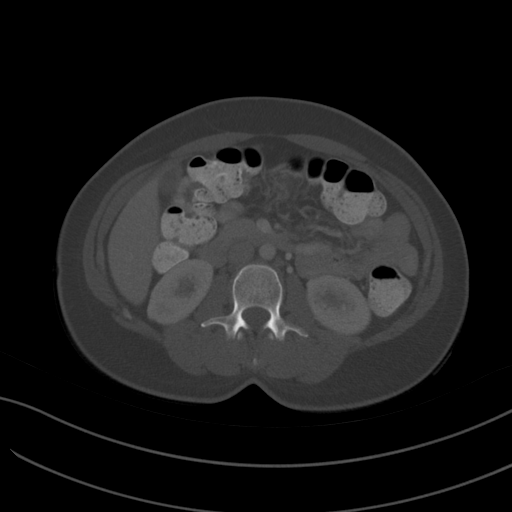
[im 59/82  soft-tissue]
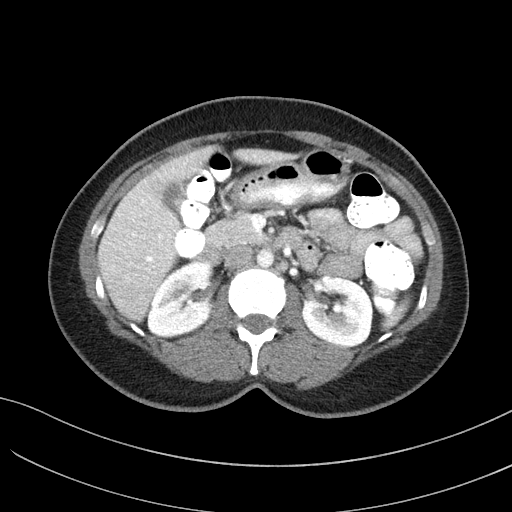
[im 64/82  soft-tissue]
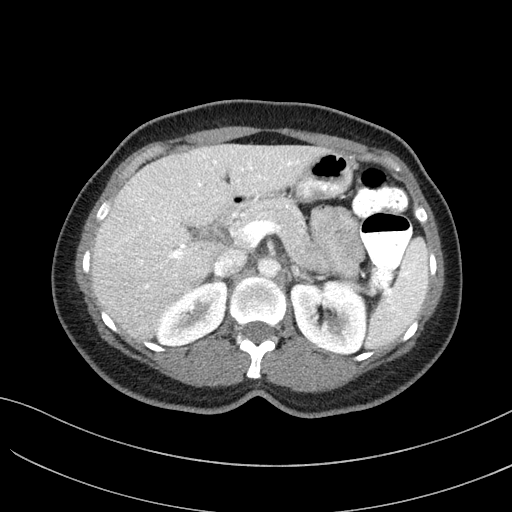
[im 73/82  soft-tissue]
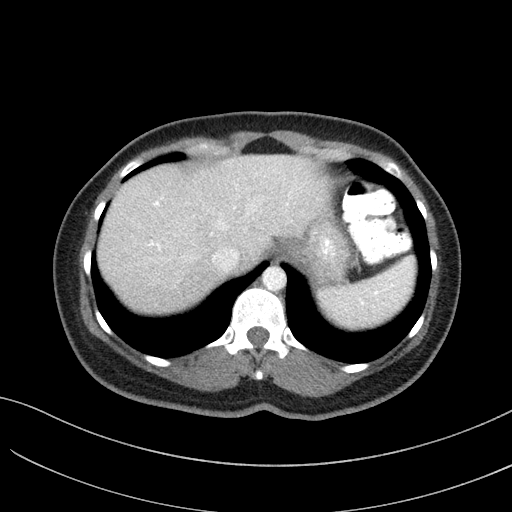
[im 77/82  soft-tissue]
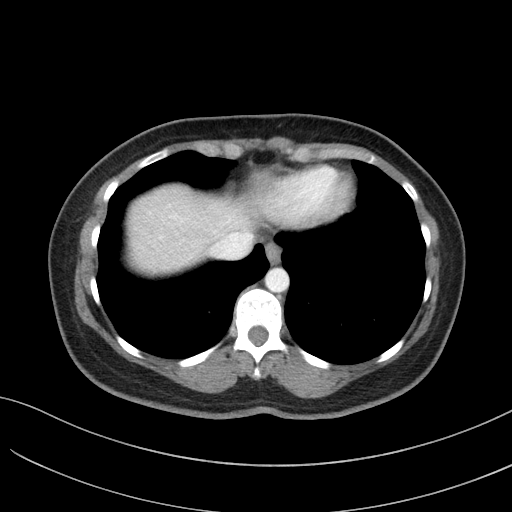

[Series 6: a/p w/ cor · coronal · 0.64mm/px · 3 of 114 slices shown]
[im 38/114  soft-tissue]
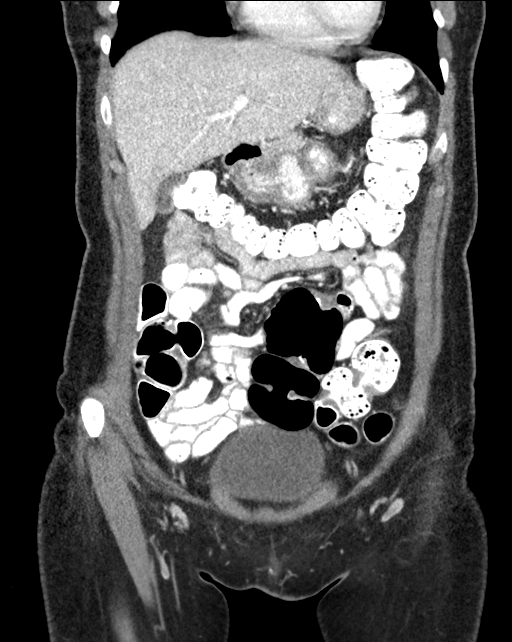
[im 51/114  soft-tissue]
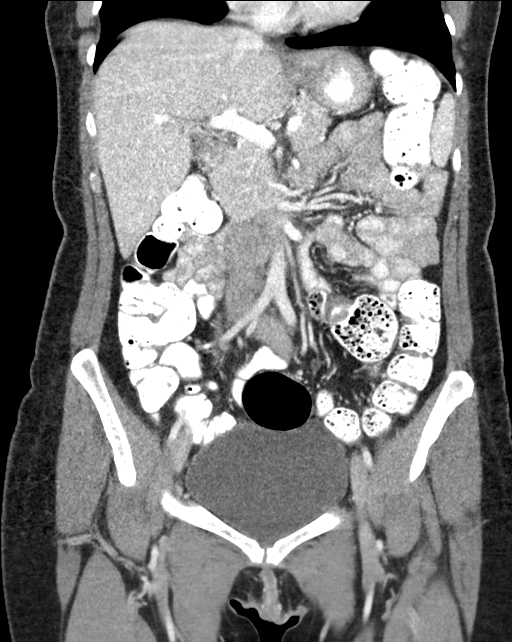
[im 63/114  soft-tissue]
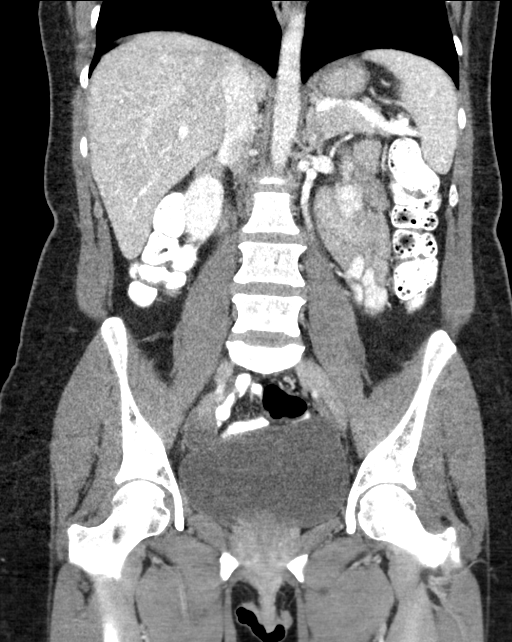

[16 of 46 positions shown; findings below may reference images not displayed]

FINDINGS: Lower chest: No acute abnormality.

Hepatobiliary: No focal liver abnormality is seen. No gallstones,
gallbladder wall thickening, or biliary dilatation.

Pancreas: Negative

Spleen: Negative

Adrenals/Urinary Tract: 9 mm right upper pole simple cyst is
unchanged with Hounsfield unit density of 5. No renal mass or
obstruction. No urinary tract calculi. Bladder normal.

Stomach/Bowel: Stomach is within normal limits. Appendix appears
normal. No evidence of bowel wall thickening, distention, or
inflammatory changes.

Vascular/Lymphatic: Negative

Reproductive: Hysterectomy. Right adnexal cyst 2.2 x 2.7 cm. Corpus
luteum cyst noted in this region on the prior CT.

Other: No free fluid

Musculoskeletal: Negative
IMPRESSION: No acute abnormality.

9 mm right upper pole renal cyst is stable

Right adnexal cyst 2.2 x 2.7 cm.  No free fluid.

## 2020-10-09 ENCOUNTER — Ambulatory Visit: Payer: No Typology Code available for payment source | Admitting: *Deleted

## 2020-10-09 ENCOUNTER — Encounter (INDEPENDENT_AMBULATORY_CARE_PROVIDER_SITE_OTHER): Payer: Self-pay

## 2020-10-09 ENCOUNTER — Ambulatory Visit: Admission: RE | Admit: 2020-10-09 | Payer: Self-pay | Source: Ambulatory Visit

## 2020-10-09 ENCOUNTER — Other Ambulatory Visit: Payer: Self-pay

## 2020-10-09 ENCOUNTER — Ambulatory Visit
Admission: RE | Admit: 2020-10-09 | Discharge: 2020-10-09 | Disposition: A | Discharge status: Home / Self Care | Payer: Self-pay | Source: Ambulatory Visit | Attending: Obstetrics and Gynecology | Admitting: Obstetrics and Gynecology

## 2020-10-09 ENCOUNTER — Ambulatory Visit: Payer: Self-pay

## 2020-10-09 VITALS — BP 112/72 | Wt 120.9 lb

## 2020-10-09 DIAGNOSIS — N644 Mastodynia: Secondary | ICD-10-CM

## 2020-10-09 DIAGNOSIS — Z1239 Encounter for other screening for malignant neoplasm of breast: Secondary | ICD-10-CM

## 2020-10-09 NOTE — Progress Notes (Signed)
[name] is a 50 y.o. female who presents to [facility] clinic today with complaint of right outer breast pain x 3 months. Patient states the pain comes and goes. Patient rates the pain at a 4 out of 10. Patient stated the pain is the same that she complained at her [facility] visit 10/13/2017. Patient stated the pain resolved in 2021 and reoccurred 3 months ago.   Pap Smear: Pap smear not completed today. Last Pap smear was 03/12/2020 at the [facility] and normal with negative HPV. Patient has a history of an abnormal Pap smear10/09/2014 at the [facility] that wasHGSIL. 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 Patient had a complete hysterectomy 07/01/2015 for cervical cancer. Per patient herPap smear on 02/26/2015 is the only abnormal Pap smear she has had. Last five Pap smears, colposcopy, LEEP, and hysterectomy results are all in EPIC.  Physical exam: Breasts Breasts symmetrical. No skin abnormalities bilateral breasts. No nipple retraction bilateral breasts. No nipple discharge bilateral breasts. No lymphadenopathy. No lumps palpated bilateral breasts. Complaints of right outer breast pain on exam.     MM DIAG BREAST TOMO BILATERAL  Result Date: 10/13/2017 CLINICAL DATA:  50 year old female with palpable right breast abnormality and associated intermittent pain. 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 EXAM: DIGITAL DIAGNOSTIC BILATERAL MAMMOGRAM WITH CAD AND TOMO ULTRASOUND RIGHT BREAST COMPARISON:  Previous exam(s). ACR Breast Density Category d: The breast tissue is extremely dense, which lowers the sensitivity of mammography. FINDINGS: Radiopaque BB was placed at the site of the patient's palpable abnormality/pain in the upper outer quadrant of the right  breast. There is underlying dense tissue with suggestion of a circumscribed mass. No additional suspicious findings are identified in either breast. Mammographic images were processed with CAD. Targeted ultrasound is performed, showing a circumscribed, anechoic cyst at the 10 o'clock position 4 cm from the nipple. It measures 0.9 x 0.9 x 0.8 cm. There is no internal vascularity. IMPRESSION: 1. Benign simple cyst in the right breast corresponding with the patient's palpable abnormality. No further imaging follow-up required. 2. No mammographic evidence of malignancy in either breast. RECOMMENDATION: Screening mammogram in one year.(Code:SM-B-01Y) I have discussed the findings and recommendations with the patient. Results were also provided in writing at the conclusion of the visit. If applicable, a reminder letter will be sent to the patient regarding the next appointment. BI-RADS CATEGORY  2: Benign. Electronically Signed   By: Crystal [name] M.D.   On: 10/13/2017 12:00   MM DIAG BREAST TOMO BILATERAL  Result Date: 09/28/2016 CLINICAL DATA:  50 year old who had high risk excisional biopsy of the left breast in May, 2017, pathology LCIS. She presents now with diffuse left breast pain and a possible palpable lump in the upper outer right breast on recent clinical examination. EXAM: 2D DIGITAL DIAGNOSTIC BILATERAL MAMMOGRAM WITH CAD AND ADJUNCT TOMO ULTRASOUND RIGHT BREAST COMPARISON:  Mammography 10/03/2015 and 10/02/2015 (left), 08/28/2015 (bilateral) 07/24/2015 (left), 04/08/2015 (bilateral). No prior right breast ultrasound. ACR Breast Density Category d: The breast tissue is extremely dense, which lowers the sensitivity of mammography. FINDINGS: Standard 2D and tomosynthesis full field CC and MLO views of both breasts were obtained. A standard and tomosynthesis spot tangential view of the area of palpable concern in the right breast was also obtained.  Normal dense fibroglandular tissue underlies the area of  palpable concern in the upper outer right breast. There is no underlying mass or architectural distortion. No new or suspicious findings in the right breast. Post surgical scar/architectural distortion is present in the outer left breast at posterior depth at the site of the prior excisional biopsy. No findings suspicious malignancy in the left breast. Mammographic images were processed with CAD. On physical exam, there is a vaguely palpable approximate 1-2 cm mass in the upper outer right breast in the area of concern. Targeted right breast ultrasound is performed, showing a circumscribed oval parallel anechoic mass at the 10 o'clock position approximately 4 cm from the nipple measuring approximately 1.0 x 1.2 x 1.2 cm, demonstrating acoustic enhancement and no internal power Doppler flow, corresponding the area of palpable concern. There is a similar benign cyst immediately adjacent to the dominant cyst measuring approximately 4 x 4 x 5 mm. A similar benign adjacent cyst measuring approximately 8 mm is also identified. No suspicious solid mass or abnormal acoustic shadowing is identified. IMPRESSION: 1. Benign cysts in the upper outer quadrant of the right breast, the largest of which measures approximately 1.2 cm and accounts for the area of palpable concern. 2. No mammographic or sonographic evidence of malignancy, right breast. 3. No mammographic evidence of malignancy, left breast. Post surgical scar/architectural distortion in the outer left breast at the site of the prior excisional biopsy. RECOMMENDATION: Screening mammogram in one year.(Code:SM-B-01Y) The importance of annual screening mammography was discussed with the patient, given her personal history of LCIS. I have discussed the findings and recommendations with the patient. Communication with the patient was achieved with the assistance of a certified interpreter. Results were also provided in writing at the conclusion of the visit. If applicable, a  reminder letter will be sent to the patient regarding the next appointment. BI-RADS CATEGORY  2: Benign. Electronically Signed   By: Evangeline Dakin M.D.   On: 09/28/2016 16:15   Pelvic/Bimanual Pap is not indicated today per BCCCP guidelines.   Smoking History: Patient has never smoked.   Patient Navigation: Patient education provided. Access to services provided for patient through [facility] program. Spanish interpreter Rudene Anda from [facility] provided.   Colorectal Cancer Screening: Per patient has had colonoscopy completed on 03/01/2018 at [place]. No complaints today.    Breast and Cervical Cancer Risk Assessment: Patient does not have family history of breast cancer, known genetic mutations, or radiation treatment to the chest before age 19. Patient has history of cervical dysplasia. Patient has no history of being immunocompromised or DES exposure in-utero.  Risk Assessment    Risk Scores      10/09/2020 10/13/2017   Last edited by: Demetrius Revel, LPN Kingslee Dowse, Heath Gold, RN   5-year risk: 1.4 % 1.6 %   Lifetime risk: 11.4 % 12.3 %          A: BCCCP exam without pap smear Complaint of right outer breast pain.  P: Referred patient to the [facility] for a diagnostic mammogram. Appointment scheduled Thursday, Oct 09, 2020 at 1240.  Loletta Parish, RN 10/09/2020 11:29 AM

## 2020-10-09 NOTE — Patient Instructions (Signed)
Explained breast self awareness with [name]. Patient did not need a Pap smear today due to last Pap smear was 03/12/2020. Due to patients history of cervical cancer, next Pap smear is due in October 2022. Referred patient to the [facility] for a diagnostic mammogram. Appointment scheduled Thursday, Oct 09, 2020 at 1240. Patient aware of appointment and will be there. Crystal [name] verbalized understanding.  Ladonya Jerkins, Arvil Chaco, RN 11:29 AM

## 2020-10-31 ENCOUNTER — Ambulatory Visit (INDEPENDENT_AMBULATORY_CARE_PROVIDER_SITE_OTHER): Payer: No Typology Code available for payment source | Admitting: Physical Medicine and Rehabilitation

## 2020-10-31 ENCOUNTER — Encounter: Payer: Self-pay | Admitting: Physical Medicine and Rehabilitation

## 2020-10-31 ENCOUNTER — Other Ambulatory Visit: Payer: Self-pay

## 2020-10-31 DIAGNOSIS — R202 Paresthesia of skin: Secondary | ICD-10-CM

## 2020-10-31 NOTE — Progress Notes (Signed)
Right shoulder pain radiating to hand. Posterior neck and shoulder pain. Numbness in second through fifth fingers of right hand every morning. Right hand dominant No lotion per patient Numeric Pain Rating Scale and Functional Assessment Average Pain 9   In the last MONTH (on 0-10 scale) has pain interfered with the following?  1. General activity like being  able to carry out your everyday physical activities such as walking, climbing stairs, carrying groceries, or moving a chair?  Rating(9)

## 2020-11-03 NOTE — Procedures (Signed)
EMG & NCV Findings: Evaluation of the right median motor nerve showed decreased conduction velocity (Elbow-Wrist, 49 m/s).  The right median (across palm) sensory nerve showed prolonged distal peak latency (Wrist, 4.1 ms).  All remaining nerves (as indicated in the following tables) were within normal limits.    All examined muscles (as indicated in the following table) showed no evidence of electrical instability.    Impression: The above electrodiagnostic study is ABNORMAL and reveals evidence of a moderate right median nerve entrapment at the wrist (carpal tunnel syndrome) affecting sensory and motor components.   There is no significant electrodiagnostic evidence of any other focal nerve entrapment, brachial plexopathy or cervical radiculopathy.  As you know, this particular electrodiagnostic study cannot rule out chemical radiculitis or sensory only radiculopathy.  Recommendations: 1.  Follow-up with referring physician. 2.  Continue current management of symptoms. 3.  Continue use of resting splint at night-time and as needed during the day.  ___________________________ Wonda Olds Board Certified, American Board of Physical Medicine and Rehabilitation    Nerve Conduction Studies Anti Sensory Summary Table   Stim Site NR Peak (ms) Norm Peak (ms) P-T Amp (V) Norm P-T Amp Site1 Site2 Delta-P (ms) Dist (cm) Vel (m/s) Norm Vel (m/s)  Right Median Acr Palm Anti Sensory (2nd Digit)  31.5C  Wrist    *4.1 <3.6 25.5 >10 Wrist Palm 2.2 0.0    Palm    1.9 <2.0 30.2         Right Radial Anti Sensory (Base 1st Digit)  31.2C  Wrist    2.0 <3.1 56.2  Wrist Base 1st Digit 2.0 0.0    Right Ulnar Anti Sensory (5th Digit)  31.6C  Wrist    3.0 <3.7 29.4 >15.0 Wrist 5th Digit 3.0 14.0 47 >38   Motor Summary Table   Stim Site NR Onset (ms) Norm Onset (ms) O-P Amp (mV) Norm O-P Amp Site1 Site2 Delta-0 (ms) Dist (cm) Vel (m/s) Norm Vel (m/s)  Right Median Motor (Abd Poll Brev)  31.2C     martin-gruber  Wrist    4.2 <4.2 5.5 >5 Elbow Wrist 3.7 18.0 *49 >50  Elbow    7.9  7.3         Right Ulnar Motor (Abd Dig Min)  31.2C  Wrist    2.4 <4.2 11.4 >3 B Elbow Wrist 2.8 17.0 61 >53  B Elbow    5.2  10.7  A Elbow B Elbow 1.1 10.0 91 >53  A Elbow    6.3  10.6          EMG   Side Muscle Nerve Root Ins Act Fibs Psw Amp Dur Poly Recrt Int Fraser Din Comment  Right Abd Poll Brev Median C8-T1 Nml Nml Nml Nml Nml 0 Nml Nml   Right 1stDorInt Ulnar C8-T1 Nml Nml Nml Nml Nml 0 Nml Nml   Right PronatorTeres Median C6-7 Nml Nml Nml Nml Nml 0 Nml Nml   Right Biceps Musculocut C5-6 Nml Nml Nml Nml Nml 0 Nml Nml   Right Deltoid Axillary C5-6 Nml Nml Nml Nml Nml 0 Nml Nml     Nerve Conduction Studies Anti Sensory Left/Right Comparison   Stim Site L Lat (ms) R Lat (ms) L-R Lat (ms) L Amp (V) R Amp (V) L-R Amp (%) Site1 Site2 L Vel (m/s) R Vel (m/s) L-R Vel (m/s)  Median Acr Palm Anti Sensory (2nd Digit)  31.5C  Wrist  *4.1   25.5  Wrist Palm  Palm  1.9   30.2        Radial Anti Sensory (Base 1st Digit)  31.2C  Wrist  2.0   56.2  Wrist Base 1st Digit     Ulnar Anti Sensory (5th Digit)  31.6C  Wrist  3.0   29.4  Wrist 5th Digit  47    Motor Left/Right Comparison   Stim Site L Lat (ms) R Lat (ms) L-R Lat (ms) L Amp (mV) R Amp (mV) L-R Amp (%) Site1 Site2 L Vel (m/s) R Vel (m/s) L-R Vel (m/s)  Median Motor (Abd Poll Brev)  31.2C    martin-gruber  Wrist  4.2   5.5  Elbow Wrist  *49   Elbow  7.9   7.3        Ulnar Motor (Abd Dig Min)  31.2C  Wrist  2.4   11.4  B Elbow Wrist  61   B Elbow  5.2   10.7  A Elbow B Elbow  91   A Elbow  6.3   10.6           Waveforms:

## 2020-11-05 NOTE — Progress Notes (Signed)
Crystal [name] - 50 y.o. female MRN [id number]  Date of birth: 10-28-1970  Office Visit Note: Visit Date: 10/31/2020 PCP: Charlott Rakes, MD Referred by: Charlott Rakes, MD  Subjective: Chief Complaint  Patient presents with   Right Arm - Pain   Right Hand - Pain, Numbness   HPI:  Crystal [name] is a 50 y.o. female who comes in today at the request of Dr. Eduard Roux for electrodiagnostic study of the Right upper extremities.  Patient is Right hand dominant.  She reports chronic worsening severe 9 out of 10 pain particular in the right shoulder and posterior neck area.  She reports referral pain into the arm with paresthesias and numbness into the middle through the fifth digit on the right hand.  This happens every morning she states.  She has not had prior electrodiagnostic studies.  She is not diabetic.   ROS Otherwise per HPI.  Assessment & Plan: Visit Diagnoses:    ICD-10-CM   1. Paresthesia of skin  R20.2 NCV with EMG (electromyography)      Plan: Impression: The above electrodiagnostic study is ABNORMAL and reveals evidence of a moderate right median nerve entrapment at the wrist (carpal tunnel syndrome) affecting sensory and motor components.   There is no significant electrodiagnostic evidence of any other focal nerve entrapment, brachial plexopathy or cervical radiculopathy.  As you know, this particular electrodiagnostic study cannot rule out chemical radiculitis or sensory only radiculopathy.  Recommendations: 1.  Follow-up with referring physician. 2.  Continue current management of symptoms. 3.  Continue use of resting splint at night-time and as needed during the day.  Meds & Orders: No orders of the defined types were placed in this encounter.   Orders Placed This Encounter  Procedures   NCV with EMG (electromyography)    Follow-up: Return in about 2 weeks (around 11/14/2020) for  Eduard Roux, MD.   Procedures: No procedures  performed  EMG & NCV Findings: Evaluation of the right median motor nerve showed decreased conduction velocity (Elbow-Wrist, 49 m/s).  The right median (across palm) sensory nerve showed prolonged distal peak latency (Wrist, 4.1 ms).  All remaining nerves (as indicated in the following tables) were within normal limits.    All examined muscles (as indicated in the following table) showed no evidence of electrical instability.    Impression: The above electrodiagnostic study is ABNORMAL and reveals evidence of a moderate right median nerve entrapment at the wrist (carpal tunnel syndrome) affecting sensory and motor components.   There is no significant electrodiagnostic evidence of any other focal nerve entrapment, brachial plexopathy or cervical radiculopathy.  As you know, this particular electrodiagnostic study cannot rule out chemical radiculitis or sensory only radiculopathy.  Recommendations: 1.  Follow-up with referring physician. 2.  Continue current management of symptoms. 3.  Continue use of resting splint at night-time and as needed during the day.  ___________________________ Wonda Olds Board Certified, American Board of Physical Medicine and Rehabilitation    Nerve Conduction Studies Anti Sensory Summary Table   Stim Site NR Peak (ms) Norm Peak (ms) P-T Amp (V) Norm P-T Amp Site1 Site2 Delta-P (ms) Dist (cm) Vel (m/s) Norm Vel (m/s)  Right Median Acr Palm Anti Sensory (2nd Digit)  31.5C  Wrist    *4.1 <3.6 25.5 >10 Wrist Palm 2.2 0.0    Palm    1.9 <2.0 30.2         Right Radial Anti Sensory (Base 1st Digit)  31.2C  Wrist  2.0 <3.1 56.2  Wrist Base 1st Digit 2.0 0.0    Right Ulnar Anti Sensory (5th Digit)  31.6C  Wrist    3.0 <3.7 29.4 >15.0 Wrist 5th Digit 3.0 14.0 47 >38   Motor Summary Table   Stim Site NR Onset (ms) Norm Onset (ms) O-P Amp (mV) Norm O-P Amp Site1 Site2 Delta-0 (ms) Dist (cm) Vel (m/s) Norm Vel (m/s)  Right Median Motor (Abd Poll  Brev)  31.2C    martin-gruber  Wrist    4.2 <4.2 5.5 >5 Elbow Wrist 3.7 18.0 *49 >50  Elbow    7.9  7.3         Right Ulnar Motor (Abd Dig Min)  31.2C  Wrist    2.4 <4.2 11.4 >3 B Elbow Wrist 2.8 17.0 61 >53  B Elbow    5.2  10.7  A Elbow B Elbow 1.1 10.0 91 >53  A Elbow    6.3  10.6          EMG   Side Muscle Nerve Root Ins Act Fibs Psw Amp Dur Poly Recrt Int Fraser Din Comment  Right Abd Poll Brev Median C8-T1 Nml Nml Nml Nml Nml 0 Nml Nml   Right 1stDorInt Ulnar C8-T1 Nml Nml Nml Nml Nml 0 Nml Nml   Right PronatorTeres Median C6-7 Nml Nml Nml Nml Nml 0 Nml Nml   Right Biceps Musculocut C5-6 Nml Nml Nml Nml Nml 0 Nml Nml   Right Deltoid Axillary C5-6 Nml Nml Nml Nml Nml 0 Nml Nml     Nerve Conduction Studies Anti Sensory Left/Right Comparison   Stim Site L Lat (ms) R Lat (ms) L-R Lat (ms) L Amp (V) R Amp (V) L-R Amp (%) Site1 Site2 L Vel (m/s) R Vel (m/s) L-R Vel (m/s)  Median Acr Palm Anti Sensory (2nd Digit)  31.5C  Wrist  *4.1   25.5  Wrist Palm     Palm  1.9   30.2        Radial Anti Sensory (Base 1st Digit)  31.2C  Wrist  2.0   56.2  Wrist Base 1st Digit     Ulnar Anti Sensory (5th Digit)  31.6C  Wrist  3.0   29.4  Wrist 5th Digit  47    Motor Left/Right Comparison   Stim Site L Lat (ms) R Lat (ms) L-R Lat (ms) L Amp (mV) R Amp (mV) L-R Amp (%) Site1 Site2 L Vel (m/s) R Vel (m/s) L-R Vel (m/s)  Median Motor (Abd Poll Brev)  31.2C    martin-gruber  Wrist  4.2   5.5  Elbow Wrist  *49   Elbow  7.9   7.3        Ulnar Motor (Abd Dig Min)  31.2C  Wrist  2.4   11.4  B Elbow Wrist  61   B Elbow  5.2   10.7  A Elbow B Elbow  91   A Elbow  6.3   10.6           Waveforms:            Clinical History: No specialty comments available.     Objective:  VS:  HT:    WT:   BMI:     BP:   HR: bpm  TEMP: ( )  RESP:  Physical Exam Musculoskeletal:        General: No swelling, tenderness or deformity.     Comments: Inspection reveals no atrophy of the bilateral APB  or FDI or hand intrinsics. There is no swelling, color changes, allodynia or dystrophic changes. There is 5 out of 5 strength in the bilateral wrist extension, finger abduction and long finger flexion. There is intact sensation to light touch in all dermatomal and peripheral nerve distributions.  There is a negative Phalen's test on the right. There is a negative Hoffmann's test bilaterally.  Skin:    General: Skin is warm and dry.     Findings: No erythema or rash.  Neurological:     General: No focal deficit present.     Mental Status: She is alert and oriented to person, place, and time.     Motor: No weakness or abnormal muscle tone.     Coordination: Coordination normal.  Psychiatric:        Mood and Affect: Mood normal.        Behavior: Behavior normal.     Imaging: No results found.

## 2020-11-18 ENCOUNTER — Ambulatory Visit: Payer: No Typology Code available for payment source | Admitting: Orthopaedic Surgery

## 2020-12-09 ENCOUNTER — Ambulatory Visit (INDEPENDENT_AMBULATORY_CARE_PROVIDER_SITE_OTHER): Payer: No Typology Code available for payment source | Admitting: Orthopaedic Surgery

## 2020-12-09 ENCOUNTER — Encounter: Payer: Self-pay | Admitting: Orthopaedic Surgery

## 2020-12-09 DIAGNOSIS — G5601 Carpal tunnel syndrome, right upper limb: Secondary | ICD-10-CM

## 2020-12-09 MED ORDER — BUPIVACAINE HCL 0.5 % IJ SOLN
1.0000 mL | INTRAMUSCULAR | Status: AC | PRN
  Administered 2020-12-09: 1 mL

## 2020-12-09 MED ORDER — METHYLPREDNISOLONE ACETATE 40 MG/ML IJ SUSP
40.0000 mg | INTRAMUSCULAR | Status: AC | PRN
  Administered 2020-12-09: 40 mg

## 2020-12-09 MED ORDER — LIDOCAINE HCL 1 % IJ SOLN
1.0000 mL | INTRAMUSCULAR | Status: AC | PRN
  Administered 2020-12-09: 1 mL

## 2020-12-09 NOTE — Progress Notes (Signed)
Office Visit Note   Patient: Crystal [name]           Date of Birth: 07/25/1970           MRN: [id number] Visit Date: 12/09/2020              Requested by: Charlott Rakes, MD [place],  [place] [address] PCP: Charlott Rakes, MD   Assessment & Plan: Visit Diagnoses:  1. Right carpal tunnel syndrome     Plan: Nerve conduction studies showed moderate carpal tunnel syndrome.  She is describing symptoms that could reflect cervical radiculopathy but my recommendation is to try a carpal tunnel injection first to see if this will alleviate her shoulder and neck symptoms.  If not we will need to obtain a cervical spine MRI.  Patient tolerated the injection well today.  She will continue to wear the brace at nighttime.  Follow-Up Instructions: Return if symptoms worsen or fail to improve.   Orders:  No orders of the defined types were placed in this encounter.  No orders of the defined types were placed in this encounter.     Procedures: Hand/UE Inj: R carpal tunnel for carpal tunnel syndrome on 12/09/2020 5:24 PM Indications: pain Details: 25 G needle Medications: 1 mL lidocaine 1 %; 1 mL bupivacaine 0.5 %; 40 mg methylPREDNISolone acetate 40 MG/ML Outcome: tolerated well, no immediate complications Patient was prepped and draped in the usual sterile fashion.      Clinical Data: No additional findings.   Subjective: Chief Complaint  Patient presents with   Right Hand - Follow-up    Patient returns today for follow-up status post recent EMGs for right carpal tunnel syndrome.  She states that the pain is getting worse in her hand and travels up into the shoulder and neck.   Review of Systems   Objective: Vital Signs: LMP 07/01/2015   Physical Exam  Ortho Exam Right hand exam is unchanged. Specialty Comments:  No specialty comments available.  Imaging: No results found.   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   not cancer per pt   PONV (postoperative nausea and vomiting)     Family History  Problem Relation Age of Onset   Hypertension Sister    Atrial fibrillation Sister    Colon cancer Neg Hx    Esophageal cancer Neg Hx    Rectal cancer Neg Hx    Stomach cancer Neg Hx     Past Surgical History:  Procedure Laterality Date   ABDOMINAL HYSTERECTOMY     BREAST BIOPSY Left 08/28/2015   MRI-  High Risk   BREAST BIOPSY Right 08/28/2015   MRI- Benign   BREAST EXCISIONAL BIOPSY Left 10/03/2015   BREAST LUMPECTOMY WITH RADIOACTIVE SEED LOCALIZATION Left 10/03/2015   Procedure: LEFT BREAST LUMPECTOMY WITH RADIOACTIVE SEED LOCALIZATION;  Surgeon: Fanny Skates, MD;  Location: [facility];  Service: General;  Laterality: Left;   CERVICAL BIOPSY  W/ LOOP ELECTRODE EXCISION     12'16   CESAREAN SECTION     2 previous   ROBOTIC ASSISTED TOTAL HYSTERECTOMY Bilateral 07/01/2015   Procedure: XI ROBOTIC ASSISTED RADICAL TYPE II HYSTERECTOMY, BILATERAL SALPINGECTOMY, SENTINAL LYMPH NODE BIOPSY;  Surgeon: Everitt Amber, MD;  Location: [facility];  Service: Gynecology;  Laterality: Bilateral;   Social  History   Occupational History   Not on file  Tobacco Use   Smoking status: Never   Smokeless tobacco: Never  Vaping Use   Vaping Use: Never used  Substance and Sexual Activity   Alcohol use: Yes    Comment: occasionally   Drug use: No   Sexual activity: Not Currently    Birth control/protection: Surgical

## 2020-12-15 ENCOUNTER — Ambulatory Visit: Payer: Self-pay | Admitting: *Deleted

## 2020-12-15 NOTE — Telephone Encounter (Signed)
Pt reports vomiting since 3am today. 6 or more times. States has kept down "A little water" States urine darker than usual. Reports "Burning at stomach, around waist." Also reports diarrhea, 6-7 times today. Reports headache 8/10, generalized weakness. Pt sounds distressed. Advised ED. States will follow disposition, has driver.   Assisted by Meryl Dare, [place]

## 2020-12-15 NOTE — Telephone Encounter (Signed)
Noted pt going to ED/UC

## 2020-12-15 NOTE — Telephone Encounter (Signed)
Reason for Disposition  Severe headache (e.g., excruciating) (Exception: similar to previous migraines)  Answer Assessment - Initial Assessment Questions 1. VOMITING SEVERITY: "How many times have you vomited in the past 24 hours?"     - MILD:  1 - 2 times/day    - MODERATE: 3 - 5 times/day, decreased oral intake without significant weight loss or symptoms of dehydration    - SEVERE: 6 or more times/day, vomits everything or nearly everything, with significant weight loss, symptoms of dehydration      6 times 2. ONSET: "When did the vomiting begin?"      3am this morning 3. FLUIDS: "What fluids or food have you vomited up today?" "Have you been able to keep any fluids down?"     Little water 4. ABDOMINAL PAIN: "Are your having any abdominal pain?" If yes : "How bad is it and what does it feel like?" (e.g., crampy, dull, intermittent, constant)      Mostly burning mid stomach, around waist 5. DIARRHEA: "Is there any diarrhea?" If Yes, ask: "How many times today?"      Yes, 6 times,loose 6. CONTACTS: "Is there anyone else in the family with the same symptoms?"      no 7. CAUSE: "What do you think is causing your vomiting?"     unsure 8. HYDRATION STATUS: "Any signs of dehydration?" (e.g., dry mouth [not only dry lips], too weak to stand) "When did you last urinate?"     Weakness, urinated 4 times since onset, a little darker 9. OTHER SYMPTOMS: "Do you have any other symptoms?" (e.g., fever, headache, vertigo, vomiting blood or coffee grounds, recent head injury)     Headache and eyes hurt  Protocols used: Vomiting-A-AH

## 2021-01-30 ENCOUNTER — Other Ambulatory Visit: Payer: Self-pay

## 2021-01-30 ENCOUNTER — Encounter (HOSPITAL_COMMUNITY): Payer: Self-pay | Admitting: *Deleted

## 2021-01-30 ENCOUNTER — Emergency Department (HOSPITAL_COMMUNITY)
Admission: EM | Admit: 2021-01-30 | Discharge: 2021-01-31 | Disposition: A | Discharge status: Home / Self Care | Payer: Self-pay | Attending: Emergency Medicine | Admitting: Emergency Medicine

## 2021-01-30 DIAGNOSIS — Z9012 Acquired absence of left breast and nipple: Secondary | ICD-10-CM | POA: Insufficient documentation

## 2021-01-30 DIAGNOSIS — M542 Cervicalgia: Secondary | ICD-10-CM | POA: Insufficient documentation

## 2021-01-30 DIAGNOSIS — K859 Acute pancreatitis without necrosis or infection, unspecified: Secondary | ICD-10-CM | POA: Insufficient documentation

## 2021-01-30 DIAGNOSIS — Z8541 Personal history of malignant neoplasm of cervix uteri: Secondary | ICD-10-CM | POA: Insufficient documentation

## 2021-01-30 LAB — CBC WITH DIFFERENTIAL/PLATELET
Abs Immature Granulocytes: 0.01 10*3/uL (ref 0.00–0.07)
Basophils Absolute: 0 10*3/uL (ref 0.0–0.1)
Basophils Relative: 1 %
Eosinophils Absolute: 0.2 10*3/uL (ref 0.0–0.5)
Eosinophils Relative: 3 %
HCT: 41.1 % (ref 36.0–46.0)
Hemoglobin: 13.9 g/dL (ref 12.0–15.0)
Immature Granulocytes: 0 %
Lymphocytes Relative: 31 %
Lymphs Abs: 2.2 10*3/uL (ref 0.7–4.0)
MCH: 32.2 pg (ref 26.0–34.0)
MCHC: 33.8 g/dL (ref 30.0–36.0)
MCV: 95.1 fL (ref 80.0–100.0)
Monocytes Absolute: 0.5 10*3/uL (ref 0.1–1.0)
Monocytes Relative: 7 %
Neutro Abs: 4.1 10*3/uL (ref 1.7–7.7)
Neutrophils Relative %: 58 %
Platelets: 265 10*3/uL (ref 150–400)
RBC: 4.32 MIL/uL (ref 3.87–5.11)
RDW: 12 % (ref 11.5–15.5)
WBC: 7 10*3/uL (ref 4.0–10.5)
nRBC: 0 % (ref 0.0–0.2)

## 2021-01-30 LAB — URINALYSIS, MICROSCOPIC (REFLEX)
RBC / HPF: NONE SEEN RBC/hpf (ref 0–5)
Squamous Epithelial / HPF: NONE SEEN (ref 0–5)

## 2021-01-30 LAB — COMPREHENSIVE METABOLIC PANEL
ALT: 26 U/L (ref 0–44)
AST: 19 U/L (ref 15–41)
Albumin: 3.9 g/dL (ref 3.5–5.0)
Alkaline Phosphatase: 83 U/L (ref 38–126)
Anion gap: 9 (ref 5–15)
BUN: 14 mg/dL (ref 6–20)
CO2: 23 mmol/L (ref 22–32)
Calcium: 9.3 mg/dL (ref 8.9–10.3)
Chloride: 105 mmol/L (ref 98–111)
Creatinine, Ser: 0.78 mg/dL (ref 0.44–1.00)
GFR, Estimated: >60 mL/min (ref >60)
Glucose, Bld: 97 mg/dL (ref 70–99)
Potassium: 3.8 mmol/L (ref 3.5–5.1)
Sodium: 137 mmol/L (ref 135–145)
Total Bilirubin: 0.9 mg/dL (ref 0.3–1.2)
Total Protein: 7.2 g/dL (ref 6.5–8.1)

## 2021-01-30 LAB — URINALYSIS, ROUTINE W REFLEX MICROSCOPIC
Bilirubin Urine: NEGATIVE
Glucose, UA: NEGATIVE mg/dL
Ketones, ur: NEGATIVE mg/dL
Nitrite: POSITIVE — AB
Protein, ur: NEGATIVE mg/dL
Specific Gravity, Urine: 1.015 (ref 1.005–1.030)
pH: 7 (ref 5.0–8.0)

## 2021-01-30 LAB — LIPASE, BLOOD: Lipase: 60 U/L — ABNORMAL HIGH (ref 11–51)

## 2021-01-30 NOTE — ED Triage Notes (Signed)
The pt is her for pain in bi-lateral flanks and low abd with rt arms pain for 2-3 months  worse for the past 3 weeks  nausea for 2 days

## 2021-01-30 NOTE — ED Provider Notes (Signed)
Emergency Medicine Provider Triage Evaluation Note  Crystal [name] , a 50 y.o. female  was evaluated in triage.  Pt complains of multiple complaints.  Her primary concern today is worsening right-sided abdominal pain.  She has had pains on multiple areas on the right side of her body including her right sided chest, shoulder, and abdomen with lower abdominal bloating for 2 months.  Reason why she came in today was that over the past 3 days her right-sided low back pain and right-sided lower abdominal pain have become severe.  She is nauseated without vomiting.  She denies any fevers. She had a hysterectomy in 2017.  She does have a history of kidney stones, states this feels different..  Review of Systems  Positive: Abdominal pain, constipation,  Negative: Syncope, fevers  Physical Exam  BP (!) 120/56 (BP Location: Left Arm)   Pulse 82   Temp 98.4 F (36.9 C) (Oral)   Resp 16   LMP 07/01/2015   SpO2 98%  Gen:   Awake, no distress   Resp:  Normal effort  MSK:   Moves extremities without difficulty  Other:  Abdomen is diffusely tender worse in right lower quadrant  Medical Decision Making  Medically screening exam initiated at 6:05 PM.  Appropriate orders placed.  Crystal [name] was informed that the remainder of the evaluation will be completed by another provider, this initial triage assessment does not replace that evaluation, and the importance of remaining in the ED until their evaluation is complete.  Note: Portions of this report may have been transcribed using voice recognition software. Every effort was made to ensure accuracy; however, inadvertent computerized transcription errors may be present    Lorin Glass, PA-C 01/30/21 1813    Drenda Freeze, MD 01/30/21 [phone]

## 2021-01-31 ENCOUNTER — Emergency Department (HOSPITAL_COMMUNITY): Payer: Self-pay

## 2021-01-31 MED ORDER — MORPHINE SULFATE (PF) 4 MG/ML IV SOLN: 4.0000 mg | Freq: Once | INTRAVENOUS | Status: DC

## 2021-01-31 MED ORDER — OXYCODONE-ACETAMINOPHEN 5-325 MG PO TABS
1.0000 | ORAL_TABLET | Freq: Once | ORAL | Status: AC
  Administered 2021-01-31: 1 via ORAL
  Filled 2021-01-31: qty 1

## 2021-01-31 MED ORDER — KETOROLAC TROMETHAMINE 60 MG/2ML IM SOLN
60.0000 mg | Freq: Once | INTRAMUSCULAR | Status: AC
  Administered 2021-01-31: 60 mg via INTRAMUSCULAR
  Filled 2021-01-31: qty 2

## 2021-01-31 MED ORDER — ONDANSETRON HCL 4 MG/2ML IJ SOLN: 4.0000 mg | Freq: Once | INTRAMUSCULAR | Status: DC

## 2021-01-31 MED ORDER — HYDROCODONE-ACETAMINOPHEN 5-325 MG PO TABS: 1.0000 | ORAL_TABLET | ORAL | 0 refills | Status: DC | PRN

## 2021-01-31 MED ORDER — KETOROLAC TROMETHAMINE 30 MG/ML IJ SOLN: 15.0000 mg | Freq: Once | INTRAMUSCULAR | Status: DC

## 2021-01-31 NOTE — ED Provider Notes (Signed)
[facility] EMERGENCY DEPARTMENT Provider Note   CSN: [id number] Arrival date & time: 01/30/21  1549     History Chief Complaint  Patient presents with   Abdominal Pain    Crystal [name] is a 49 y.o. female.  Patient presents to the emergency department with multiple complaints.  Patient has been experiencing bilateral mid abdominal pain and flank pain on and off for several months.  No associated vomiting, diarrhea.  Pain today has been predominantly right-sided on the abdomen.  Additionally she is experiencing pain on the right side of her neck, right shoulder and right arm.      Past Medical History:  Diagnosis Date   Allergy    Cancer ([place])    cervical cancer dx.    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   Urinary retention with incomplete bladder emptying 07/23/2015   Post-operative nausea and vomiting 07/08/2015   Cervical cancer ([place]) 07/01/2015   Malignant neoplasm of cervix ([place]) 06/02/2015   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   Renal mass, right 04/14/2015   Periodic health assessment, general screening, adult 02/23/2013    Past Surgical History:  Procedure Laterality Date   ABDOMINAL HYSTERECTOMY     BREAST BIOPSY Left 08/28/2015   MRI- High Risk   BREAST BIOPSY Right 08/28/2015   MRI- Benign   BREAST EXCISIONAL BIOPSY Left 10/03/2015   BREAST LUMPECTOMY WITH RADIOACTIVE SEED LOCALIZATION Left 10/03/2015   Procedure: LEFT BREAST LUMPECTOMY WITH RADIOACTIVE SEED LOCALIZATION;   Surgeon: Fanny Skates, MD;  Location: [facility];  Service: General;  Laterality: Left;   CERVICAL BIOPSY  W/ LOOP ELECTRODE EXCISION     12'16   CESAREAN SECTION     2 previous   ROBOTIC ASSISTED TOTAL HYSTERECTOMY Bilateral 07/01/2015   Procedure: XI ROBOTIC ASSISTED RADICAL TYPE II HYSTERECTOMY, BILATERAL SALPINGECTOMY, SENTINAL LYMPH NODE BIOPSY;  Surgeon: Everitt Amber, MD;  Location: [facility];  Service: Gynecology;  Laterality: Bilateral;     OB History     Gravida  2   Para  2   Term  2   Preterm      AB      Living  2      SAB      IAB      Ectopic      Multiple      Live Births  2           Family History  Problem Relation Age of Onset   Hypertension Sister    Atrial fibrillation Sister    Colon cancer Neg Hx    Esophageal cancer Neg Hx    Rectal cancer Neg Hx    Stomach cancer Neg Hx     Social History   Tobacco Use   Smoking status: Never   Smokeless tobacco: Never  Vaping Use   Vaping Use: Never used  Substance Use Topics   Alcohol use: Yes    Comment: occasionally  Drug use: No    Home Medications Prior to Admission medications   Medication Sig Start Date End Date Taking? Authorizing Provider  acetaminophen (TYLENOL) 500 MG tablet Take 1,000 mg by mouth every 4 (four) hours as needed. Patient not taking: No sig reported    [provider]  cyclobenzaprine (FLEXERIL) 10 MG tablet TAKE 1 TABLET (10 MG TOTAL) BY MOUTH AT BEDTIME. Patient not taking: No sig reported 07/22/20 07/22/21  Charlott Rakes, MD  ergocalciferol (DRISDOL) 1.25 MG (50000 UT) capsule Take 1 capsule (50,000 Units total) by mouth once a week. Patient not taking: No sig reported 04/16/20   Charlott Rakes, MD  naproxen (NAPROSYN) 500 MG tablet Take 1 tablet (500 mg total) by mouth 2 (two) times daily with a meal. Patient not taking: No sig reported 03/20/20   Camillia Herter, NP  polyethylene glycol powder (GLYCOLAX/MIRALAX) 17 GM/SCOOP powder Take  17 g by mouth daily. Patient not taking: No sig reported 03/20/20   Camillia Herter, NP  predniSONE (DELTASONE) 10 MG tablet take 6 tablets by mouth once on day 1 then decrease by 1 each day after (6-5-4-3-2-1) 09/16/20   Leandrew Koyanagi, MD  Vitamin D, Ergocalciferol, (DRISDOL) 1.25 MG (50000 UNIT) CAPS capsule TAKE 1 CAPSULE BY MOUTH ONCE A WEEK. Patient not taking: No sig reported 04/16/20 04/16/21  Charlott Rakes, MD    Allergies    Contrast media [iodinated diagnostic agents]  Review of Systems   Review of Systems  Gastrointestinal:  Positive for abdominal pain.  Genitourinary:  Positive for flank pain.  Musculoskeletal:  Positive for back pain and neck pain.  All other systems reviewed and are negative.  Physical Exam Updated Vital Signs BP 122/69   Pulse 63   Temp 97.9 F (36.6 C) (Oral)   Resp 18   Ht '5\' 2"'$  (1.575 m)   Wt 54.8 kg   LMP 07/01/2015   SpO2 98%   BMI 22.10 kg/m   Physical Exam Vitals and nursing note reviewed.  Constitutional:      General: She is not in acute distress.    Appearance: Normal appearance. She is well-developed.  HENT:     Head: Normocephalic and atraumatic.     Right Ear: Hearing normal.     Left Ear: Hearing normal.     Nose: Nose normal.  Eyes:     Conjunctiva/sclera: Conjunctivae normal.     Pupils: Pupils are equal, round, and reactive to light.  Neck:   Cardiovascular:     Rate and Rhythm: Regular rhythm.     Heart sounds: S1 normal and S2 normal. No murmur heard.   No friction rub. No gallop.  Pulmonary:     Effort: Pulmonary effort is normal. No respiratory distress.     Breath sounds: Normal breath sounds.  Chest:     Chest wall: No tenderness.  Abdominal:     General: Bowel sounds are normal.     Palpations: Abdomen is soft.     Tenderness: There is no abdominal tenderness. There is no guarding or rebound. Negative signs include Murphy's sign and McBurney's sign.     Hernia: No hernia is present.  Musculoskeletal:         General: Normal range of motion.     Right shoulder: Tenderness present. No swelling, deformity or bony tenderness.     Cervical back: Normal range of motion and neck supple. Muscular tenderness present. No spinous process tenderness.  Skin:    General: Skin is  warm and dry.     Findings: No rash.  Neurological:     Mental Status: She is alert and oriented to person, place, and time.     GCS: GCS eye subscore is 4. GCS verbal subscore is 5. GCS motor subscore is 6.     Cranial Nerves: No cranial nerve deficit.     Sensory: No sensory deficit.     Coordination: Coordination normal.  Psychiatric:        Speech: Speech normal.        Behavior: Behavior normal.        Thought Content: Thought content normal.    ED Results / Procedures / Treatments   Labs (all labs ordered are listed, but only abnormal results are displayed) Labs Reviewed  URINALYSIS, ROUTINE W REFLEX MICROSCOPIC - Abnormal; Notable for the following components:      Result Value   Hgb urine dipstick TRACE (*)    Nitrite POSITIVE (*)    Leukocytes,Ua SMALL (*)    All other components within normal limits  LIPASE, BLOOD - Abnormal; Notable for the following components:   Lipase 60 (*)    All other components within normal limits  URINALYSIS, MICROSCOPIC (REFLEX) - Abnormal; Notable for the following components:   Bacteria, UA FEW (*)    All other components within normal limits  URINE CULTURE  COMPREHENSIVE METABOLIC PANEL  CBC WITH DIFFERENTIAL/PLATELET    EKG None  Radiology CT ABDOMEN PELVIS WO CONTRAST  Result Date: 01/31/2021 CLINICAL DATA:  Abdominal pain. EXAM: CT ABDOMEN AND PELVIS WITHOUT CONTRAST TECHNIQUE: Multidetector CT imaging of the abdomen and pelvis was performed following the standard protocol without IV contrast. COMPARISON:  CT abdomen pelvis dated 09/14/2019. FINDINGS: Evaluation of this exam is limited in the absence of intravenous contrast. Lower chest: The visualized lung bases  are clear. No intra-abdominal free air or free fluid. Hepatobiliary: No focal liver abnormality is seen. No gallstones, gallbladder wall thickening, or biliary dilatation. Pancreas: Mild haziness of the peripancreatic fat. Correlation with pancreatic enzymes recommended to evaluate for possibility of acute pancreatitis. Spleen: Normal in size without focal abnormality. Adrenals/Urinary Tract: The adrenal glands are unremarkable. Several nonobstructing bilateral renal calculi measure up to 4 mm. There is no hydronephrosis on either side. The visualized ureters and urinary bladder are unremarkable. Stomach/Bowel: There is moderate stool throughout the colon. Multiple normal caliber fecalized loops of small bowel throughout the abdomen may represent increased transit time or small intestinal bacterial overgrowth. Clinical correlation is recommended. There is no bowel obstruction or active inflammation. The appendix is normal. Vascular/Lymphatic: The abdominal aorta and IVC are grossly unremarkable on this noncontrast CT. No portal venous gas. There is no adenopathy. Reproductive: Hysterectomy.  No adnexal masses. Other: None Musculoskeletal: No acute or significant osseous findings. IMPRESSION: 1. Mild haziness of the peripancreatic fat. Correlation with pancreatic enzymes recommended to evaluate for possibility of acute pancreatitis. 2. Nonobstructing bilateral renal calculi. No hydronephrosis. 3. No bowel obstruction. Normal appendix. Electronically Signed   By: Anner Crete M.D.   On: 01/31/2021 01:59    Procedures Procedures   Medications Ordered in ED Medications - No data to display  ED Course  I have reviewed the triage vital signs and the nursing notes.  Pertinent labs & imaging results that were available during my care of the patient were reviewed by me and considered in my medical decision making (see chart for details).    MDM Rules/Calculators/A&P  Patient  presents with multiple pain complaints.  Patient has been experiencing bilateral mid abdominal and back pain for months.  Pain present on both sides but in recent days has become more severe on the right side.  Lamination is benign.  She has mild tenderness diffusely in the abdomen, no focal tenderness, no guarding or rebound.  Patient has normal strength and sensation in lower extremities, no neurologic deficit.  Patient is complaining of right-sided neck pain.  She has diffuse soft tissue tenderness on the right side of her neck and anterior as well as posterior right shoulder without deformity.  Patient has painful inhibition of range of motion but normal strength and sensation.  Patient noted to have slightly elevated lipase at 60.  Otherwise her labs were unremarkable.  Patient underwent CT abdomen and pelvis which did show some peripancreatic inflammation.  This will require further evaluation but can be deferred to outpatient.  She is not vomiting or experiencing uncontrollable pain.  Patient has been seen at [facility] in the past, will refer.  Provided pain control.  Final Clinical Impression(s) / ED Diagnoses Final diagnoses:  Neck pain  Acute pancreatitis without infection or necrosis, unspecified pancreatitis type    Rx / DC Orders ED Discharge Orders     None        Orpah Greek, MD 01/31/21 [phone]

## 2021-02-02 ENCOUNTER — Telehealth: Payer: Self-pay | Admitting: Gastroenterology

## 2021-02-02 LAB — URINE CULTURE: Culture: >=100000 — AB

## 2021-02-02 NOTE — Telephone Encounter (Signed)
Patient was diagnosed with pancreatitis at an ER visit. She is needing follow up. She reports she is more comfortable than she was the night of the ER visit. She is eating carefully. She is suffering with constipation. Warned her this is a side effect of the pain medication. Advised Miralax up to 3 glasses a day. Avoid high fat foods and alcohol.  Appointment 02/20/21 at 9:30 am.  Forde Dandy was through her daughter acting as the interpreter.

## 2021-02-03 ENCOUNTER — Telehealth: Payer: Self-pay

## 2021-02-03 NOTE — Telephone Encounter (Signed)
Post ED Visit - Positive Culture Follow-up  Culture report reviewed by antimicrobial stewardship pharmacist: [place] Team '[]'$  Elenor Quinones, Pharm.D. '[]'$  Heide Guile, Pharm.D., BCPS AQ-ID '[]'$  Parks Neptune, Pharm.D., BCPS '[]'$  Alycia Rossetti, Pharm.D., BCPS '[]'$  [place], [place].D., BCPS, AAHIVP '[]'$  Legrand Como, Pharm.D., BCPS, AAHIVP '[]'$  Salome Arnt, PharmD, BCPS '[]'$  Johnnette Gourd, PharmD, BCPS '[]'$  Hughes Better, PharmD, BCPS '[]'$  Leeroy Cha, PharmD '[]'$  Laqueta Linden, PharmD, BCPS '[]'$  Albertina Parr, PharmD  [place] Team '[]'$  Leodis Sias, PharmD '[]'$  Lindell Spar, PharmD '[]'$  Royetta Asal, PharmD '[]'$  Graylin Shiver, Rph '[]'$  Rema Fendt) Glennon Mac, PharmD '[]'$  Arlyn Dunning, PharmD '[]'$  Netta Cedars, PharmD '[]'$  Dia Sitter, PharmD '[]'$  Leone Haven, PharmD '[]'$  Gretta Arab, PharmD '[]'$  Theodis Shove, PharmD '[]'$  Peggyann Juba, PharmD '[]'$  Reuel Boom, PharmD   Positive urine culture Discussed with ED provider Sherrill Raring, [facility].  No treatment needed No further patient follow-up is required at this time.  Glennon Hamilton 02/03/2021, 10:03 AM

## 2021-02-20 ENCOUNTER — Ambulatory Visit (INDEPENDENT_AMBULATORY_CARE_PROVIDER_SITE_OTHER): Payer: Self-pay | Admitting: Gastroenterology

## 2021-02-20 ENCOUNTER — Other Ambulatory Visit: Payer: Self-pay

## 2021-02-20 ENCOUNTER — Other Ambulatory Visit (INDEPENDENT_AMBULATORY_CARE_PROVIDER_SITE_OTHER): Payer: Self-pay

## 2021-02-20 ENCOUNTER — Encounter: Payer: Self-pay | Admitting: Gastroenterology

## 2021-02-20 VITALS — BP 118/60 | HR 91 | Ht 58.5 in | Wt 115.0 lb

## 2021-02-20 DIAGNOSIS — K861 Other chronic pancreatitis: Secondary | ICD-10-CM

## 2021-02-20 DIAGNOSIS — K5904 Chronic idiopathic constipation: Secondary | ICD-10-CM

## 2021-02-20 DIAGNOSIS — R1084 Generalized abdominal pain: Secondary | ICD-10-CM

## 2021-02-20 LAB — LIPASE: Lipase: 41 U/L (ref 11.0–59.0)

## 2021-02-20 LAB — AMYLASE: Amylase: 76 U/L (ref 27–131)

## 2021-02-20 MED ORDER — LINACLOTIDE 290 MCG PO CAPS
290.0000 ug | ORAL_CAPSULE | Freq: Every day | ORAL | 3 refills | Status: DC
Start: 2021-02-20 — End: 2021-10-16
  Filled 2021-02-20 – 2021-06-18 (×3): qty 30, 30d supply, fill #0

## 2021-02-20 NOTE — Progress Notes (Signed)
Crystal [name]    [id number]    04/22/1971  Primary Care Physician:Newlin, Charlane Ferretti, MD  Referring Physician: Charlott Rakes, MD [address] [place],  [place] [address]   Chief complaint:  Abdominal pain, chronic constipation  HPI: 51 year old Hispanic very pleasant female, is accompanied by [place] language interpreter , here with complaints of generalized abdominal pain, lower back pain and hip pain. She was in the ER earlier this month for evaluation of abdominal pain.  CT abdomen pelvis showed mild peripancreatic haziness, she had mild elevation in pancreatic lipase. She had significant stool burden in the colon and also had some fecalization of the small bowel.  She continues to struggle with constipation, she has changed her dietary habits and is trying to eat more fiber.  She is currently not taking any laxatives. Denies any vomiting, melena or rectal bleeding.  No unintentional weight loss or decreased appetite.  CT abdominal and pelvis with contrast January 30, 2021 1. Mild haziness of the peripancreatic fat. Correlation with pancreatic enzymes recommended to evaluate for possibility of acute pancreatitis. 2. Nonobstructing bilateral renal calculi. No hydronephrosis. 3. No bowel obstruction. Normal appendix.   Colonoscopy  03/01/2018 - One 6 mm polyp (Tubular adenoma) in the descending colon, removed with a cold snare. Resected and retrieved. - Non-bleeding internal hemorrhoids.  EGD May 12, 2017 - Normal esophagus. - Erythematous mucosa in the stomach. Biopsied for CLOtest, was negative for H. pylori. - Normal examined duodenum.  Outpatient Encounter Medications as of 02/20/2021  Medication Sig   [DISCONTINUED] acetaminophen (TYLENOL) 500 MG tablet Take 1,000 mg by mouth every 4 (four) hours as needed. (Patient not taking: No sig reported)   [DISCONTINUED] cyclobenzaprine (FLEXERIL) 10 MG tablet TAKE 1 TABLET (10 MG TOTAL) BY MOUTH  AT BEDTIME. (Patient not taking: No sig reported)   [DISCONTINUED] ergocalciferol (DRISDOL) 1.25 MG (50000 UT) capsule Take 1 capsule (50,000 Units total) by mouth once a week. (Patient not taking: No sig reported)   [DISCONTINUED] HYDROcodone-acetaminophen (NORCO/VICODIN) 5-325 MG tablet Take 1 tablet by mouth every 4 (four) hours as needed for moderate pain.   [DISCONTINUED] naproxen (NAPROSYN) 500 MG tablet Take 1 tablet (500 mg total) by mouth 2 (two) times daily with a meal. (Patient not taking: No sig reported)   [DISCONTINUED] polyethylene glycol powder (GLYCOLAX/MIRALAX) 17 GM/SCOOP powder Take 17 g by mouth daily. (Patient not taking: No sig reported)   [DISCONTINUED] predniSONE (DELTASONE) 10 MG tablet take 6 tablets by mouth once on day 1 then decrease by 1 each day after (6-5-4-3-2-1)   [DISCONTINUED] Vitamin D, Ergocalciferol, (DRISDOL) 1.25 MG (50000 UNIT) CAPS capsule TAKE 1 CAPSULE BY MOUTH ONCE A WEEK. (Patient not taking: No sig reported)   No facility-administered encounter medications on file as of 02/20/2021.    Allergies as of 02/20/2021 - Review Complete 02/20/2021  Allergen Reaction Noted   Contrast media [iodinated diagnostic agents] Itching 03/02/2019    Past Medical History:  Diagnosis Date   Allergy    Cancer ([place])    cervical cancer dx.    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   not cancer per pt   PONV (postoperative nausea and vomiting)     Past Surgical History:  Procedure Laterality Date   ABDOMINAL HYSTERECTOMY  BREAST BIOPSY Left 08/28/2015   MRI- High Risk   BREAST BIOPSY Right 08/28/2015   MRI- Benign   BREAST EXCISIONAL BIOPSY Left 10/03/2015   BREAST LUMPECTOMY WITH RADIOACTIVE SEED LOCALIZATION Left 10/03/2015   Procedure: LEFT BREAST LUMPECTOMY WITH RADIOACTIVE SEED LOCALIZATION;  Surgeon: Fanny Skates, MD;  Location:  [facility];  Service: General;  Laterality: Left;   CERVICAL BIOPSY  W/ LOOP ELECTRODE EXCISION     12'16   CESAREAN SECTION     2 previous   ROBOTIC ASSISTED TOTAL HYSTERECTOMY Bilateral 07/01/2015   Procedure: XI ROBOTIC ASSISTED RADICAL TYPE II HYSTERECTOMY, BILATERAL SALPINGECTOMY, SENTINAL LYMPH NODE BIOPSY;  Surgeon: Everitt Amber, MD;  Location: [facility];  Service: Gynecology;  Laterality: Bilateral;    Family History  Problem Relation Age of Onset   Hypertension Sister    Atrial fibrillation Sister    Colon cancer Neg Hx    Esophageal cancer Neg Hx    Rectal cancer Neg Hx    Stomach cancer Neg Hx     Social History   Socioeconomic History   Marital status: Widowed    Spouse name: Not on file   Number of children: 2   Years of education: Not on file   Highest education level: 9th grade  Occupational History   Occupation: cleans homes  Tobacco Use   Smoking status: Never   Smokeless tobacco: Never  Vaping Use   Vaping Use: Never used  Substance and Sexual Activity   Alcohol use: Yes    Comment: occasionally   Drug use: No   Sexual activity: Not Currently    Birth control/protection: Surgical  Other Topics Concern   Not on file  Social History Narrative   Not on file   Social Determinants of Health   Financial Resource Strain: Not on file  Food Insecurity: Not on file  Transportation Needs: No Transportation Needs   Lack of Transportation (Medical): No   Lack of Transportation (Non-Medical): No  Physical Activity: Not on file  Stress: Not on file  Social Connections: Not on file  Intimate Partner Violence: Not on file      Review of systems: All other review of systems negative except as mentioned in the HPI.   Physical Exam: Vitals:   02/20/21 0929  BP: 118/60  Pulse: 91   Body mass index is 23.63 kg/m. Gen:      No acute distress HEENT:  sclera anicteric Abd:      soft, non-tender; no palpable masses, no distension Ext:    No  edema Neuro: alert and oriented x 3 Psych: normal mood and affect  Data Reviewed:  Reviewed labs, radiology imaging, old records and pertinent past GI work up   Assessment and Plan/Recommendations:  50 year old very pleasant female with complaints of generalized abdominal pain, back pain and hip pain She is currently working 7 days a week, does housekeeping. Abdominal pain is multifactorial, is likely secondary to Musculoskeletal pain, associated with increased colonic stool burden, nephrolithiasis and ?  Mild idiopathic pancreatitis  Mild pancreatitis: No gallstones.  Advised patient to avoid alcohol, NSAIDs or any herbal remedies Follow-up amylase and lipase Maintain adequate hydration  Chronic idiopathic constipation with increased colonic stool burden: MiraLAX bowel purge and start Linzess 290 mcg daily Increase dietary fiber and water intake  History of adenomatous colon polyp: Due for surveillance colonoscopy October 2026  Nephrolithiasis: Follow-up with urology  Return in 3 to 4 months or sooner if needed  This visit required >  40 minutes of patient care (this includes precharting, chart review, review of results, face-to-face time used for counseling as well as treatment plan and follow-up. The patient was provided an opportunity to ask questions and all were answered. The patient agreed with the plan and demonstrated an understanding of the instructions.  Damaris Hippo , MD    CC: Charlott Rakes, MD

## 2021-02-20 NOTE — Patient Instructions (Addendum)
Your provider has requested that you go to the basement level for lab work before leaving today. Press "B" on the elevator. The lab is located at the first door on the left as you exit the elevator.   We will send Linzess 290 mcg to your pharmacy today  Dr Silverio Decamp recommends that you complete a bowel purge (to clean out your bowels). Please do the following: Purchase a bottle of Miralax over the counter as well as a box of 5 mg dulcolax tablets. Take 4 dulcolax tablets. Wait 1 hour. You will then drink 6-8 capfuls of Miralax mixed in an adequate amount of water/juice/gatorade (you may choose which of these liquids to drink) over the next 2-3 hours. You should expect results within 1 to 6 hours after completing the bowel purge.   Follow up in 3 months  Due to recent changes in healthcare laws, you may see the results of your imaging and laboratory studies on MyChart before your provider has had a chance to review them.  We understand that in some cases there may be results that are confusing or concerning to you. Not all laboratory results come back in the same time frame and the provider may be waiting for multiple results in order to interpret others.  Please give [place] 48 hours in order for your provider to thoroughly review all the results before contacting the office for clarification of your results.    If you are age 61 or older, your body mass index should be between 23-30. Your Body mass index is 23.63 kg/m. If this is out of the aforementioned range listed, please consider follow up with your Primary Care Provider.  If you are age 71 or younger, your body mass index should be between 19-25. Your Body mass index is 23.63 kg/m. If this is out of the aformentioned range listed, please consider follow up with your Primary Care Provider.   __________________________________________________________  The [facility] providers would like to encourage you to use [facility] to communicate with  providers for non-urgent requests or questions.  Due to long hold times on the telephone, sending your provider a message by [facility] may be a faster and more efficient way to get a response.  Please allow 48 business hours for a response.  Please remember that this is for non-urgent requests.    Thank you for choosing [facility]  Karleen Hampshire [place],MD

## 2021-02-26 ENCOUNTER — Telehealth: Payer: Self-pay

## 2021-02-26 NOTE — Telephone Encounter (Signed)
Left message via interpreter, to please call back.

## 2021-02-26 NOTE — Telephone Encounter (Signed)
-----   Message from Mauri Pole, MD sent at 02/20/2021  1:02 PM EDT ----- Pancreatic amylase and lipase within normal range.    Please inform patient the results. Thanks

## 2021-02-27 ENCOUNTER — Other Ambulatory Visit: Payer: Self-pay

## 2021-03-03 NOTE — Telephone Encounter (Signed)
Pt requesting refill

## 2021-03-03 NOTE — Telephone Encounter (Signed)
Patient came by request Medication refill for Naproxen 500 MG. Patient indicates she still has pain in back and hip. Next appt until 11/22. Please call pharm to refill

## 2021-03-04 ENCOUNTER — Other Ambulatory Visit: Payer: Self-pay

## 2021-03-04 MED ORDER — NAPROXEN 500 MG PO TABS
500.0000 mg | ORAL_TABLET | Freq: Two times a day (BID) | ORAL | 1 refills | Status: DC
  Filled 2021-03-04: qty 60, 30d supply, fill #0

## 2021-03-04 NOTE — Telephone Encounter (Signed)
Refilled

## 2021-03-05 ENCOUNTER — Other Ambulatory Visit: Payer: Self-pay

## 2021-03-09 ENCOUNTER — Other Ambulatory Visit: Payer: Self-pay

## 2021-04-14 ENCOUNTER — Ambulatory Visit: Payer: Self-pay | Admitting: Family Medicine

## 2021-04-28 ENCOUNTER — Encounter: Payer: Self-pay | Admitting: Family Medicine

## 2021-04-28 ENCOUNTER — Other Ambulatory Visit: Payer: Self-pay

## 2021-04-28 ENCOUNTER — Ambulatory Visit: Payer: Self-pay

## 2021-04-28 ENCOUNTER — Ambulatory Visit: Payer: Self-pay | Attending: Family Medicine | Admitting: Family Medicine

## 2021-04-28 VITALS — BP 113/73 | HR 76 | Ht 59.5 in | Wt 116.6 lb

## 2021-04-28 DIAGNOSIS — B029 Zoster without complications: Secondary | ICD-10-CM

## 2021-04-28 MED ORDER — GABAPENTIN 300 MG PO CAPS
300.0000 mg | ORAL_CAPSULE | Freq: Every day | ORAL | 3 refills | Status: DC
Start: 2021-04-28 — End: 2021-12-23
  Filled 2021-04-28: qty 30, 30d supply, fill #0

## 2021-04-28 MED ORDER — VALACYCLOVIR HCL 500 MG PO TABS
500.0000 mg | ORAL_TABLET | Freq: Two times a day (BID) | ORAL | 0 refills | Status: DC
Start: 2021-04-28 — End: 2022-12-27
  Filled 2021-04-28: qty 14, 7d supply, fill #0

## 2021-04-28 NOTE — Progress Notes (Signed)
Subjective:  Patient ID: Crystal [name], female    DOB: 03-13-1971  Age: 50 y.o. MRN: [id number]  CC: Mouth Lesions   HPI Crystal [name] is a 50 y.o. year old female with a history of GERD, Stage 1B cervical cancer status post robotic-assisted type III radical laparoscopic hysterectomy with bilateral salpingectomy and bilateral sentinel lymph node biopsy in 06/2015 (no residual carcinoma as per GYN )  Interval History: She complains of a 2 week history of mouth sores with are itchy and burn and located on the lower left side of her chin. She has had no fever. Has not used any medications for her symptoms. Past Medical History:  Diagnosis Date   Allergy    Cancer ([place])    cervical cancer dx.    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   not cancer per pt   PONV (postoperative nausea and vomiting)     Past Surgical History:  Procedure Laterality Date   ABDOMINAL HYSTERECTOMY     BREAST BIOPSY Left 08/28/2015   MRI- High Risk   BREAST BIOPSY Right 08/28/2015   MRI- Benign   BREAST EXCISIONAL BIOPSY Left 10/03/2015   BREAST LUMPECTOMY WITH RADIOACTIVE SEED LOCALIZATION Left 10/03/2015   Procedure: LEFT BREAST LUMPECTOMY WITH RADIOACTIVE SEED LOCALIZATION;  Surgeon: Fanny Skates, MD;  Location: [facility];  Service: General;  Laterality: Left;   CERVICAL BIOPSY  W/ LOOP ELECTRODE EXCISION     12'16   CESAREAN SECTION     2 previous   ROBOTIC ASSISTED TOTAL HYSTERECTOMY Bilateral 07/01/2015   Procedure: XI ROBOTIC ASSISTED RADICAL TYPE II HYSTERECTOMY, BILATERAL SALPINGECTOMY, SENTINAL LYMPH NODE BIOPSY;  Surgeon: Everitt Amber, MD;  Location: [facility];  Service: Gynecology;  Laterality: Bilateral;    Family History  Problem Relation Age of Onset   Hypertension Sister    Atrial fibrillation Sister    Colon cancer Neg Hx     Esophageal cancer Neg Hx    Rectal cancer Neg Hx    Stomach cancer Neg Hx     Allergies  Allergen Reactions   Contrast Media [Iodinated Diagnostic Agents] Itching    13 hr prep in the future; itching of tongue and around the mouth 03/02/2019    Outpatient Medications Prior to Visit  Medication Sig Dispense Refill   linaclotide (LINZESS) 290 MCG CAPS capsule Take 1 capsule (290 mcg total) by mouth daily before breakfast. (Patient not taking: Reported on 04/28/2021) 30 capsule 3   naproxen (NAPROSYN) 500 MG tablet Take 1 tablet (500 mg total) by mouth 2 (two) times daily with a meal. (Patient not taking: Reported on 04/28/2021) 60 tablet 1   No facility-administered medications prior to visit.     ROS Review of Systems  Constitutional:  Negative for activity change, appetite change and fatigue.  HENT:  Negative for congestion, sinus pressure and sore throat.   Eyes:  Negative for visual disturbance.  Respiratory:  Negative for cough, chest tightness, shortness of breath and wheezing.   Cardiovascular:  Negative for chest pain and palpitations.  Gastrointestinal:  Negative for abdominal distention, abdominal pain and constipation.  Endocrine: Negative for polydipsia.  Genitourinary:  Negative for dysuria and frequency.  Musculoskeletal:  Negative for arthralgias and back pain.  Skin:  Positive for rash.  Neurological:  Negative for tremors, light-headedness and  numbness.  Hematological:  Does not bruise/bleed easily.  Psychiatric/Behavioral:  Negative for agitation and behavioral problems.    Objective:  BP 113/73   Pulse 76   Ht 4' 11.5" (1.511 m)   Wt 116 lb 9.6 oz (52.9 kg)   LMP 07/01/2015   SpO2 98%   BMI 23.16 kg/m   BP/Weight 04/28/2021 02/20/2021 1/61/0960  Systolic BP 454 098 119  Diastolic BP 73 60 64  Wt. (Lbs) 116.6 115 -  BMI 23.16 23.63 -      Physical Exam Constitutional:      Appearance: She is well-developed.  Cardiovascular:     Rate and Rhythm:  Normal rate.     Heart sounds: Normal heart sounds. No murmur heard. Pulmonary:     Effort: Pulmonary effort is normal.     Breath sounds: Normal breath sounds. No wheezing or rales.  Chest:     Chest wall: No tenderness.  Abdominal:     General: Bowel sounds are normal. There is no distension.     Palpations: Abdomen is soft. There is no mass.     Tenderness: There is no abdominal tenderness.  Musculoskeletal:        General: Normal range of motion.     Right lower leg: No edema.     Left lower leg: No edema.  Skin:    Comments: Vesicles on lower left aspect of chin  Neurological:     Mental Status: She is alert and oriented to person, place, and time.  Psychiatric:        Mood and Affect: Mood normal.    CMP Latest Ref Rng & Units 01/30/2021 08/08/2019 02/19/2019  Glucose 70 - 99 mg/dL 97 102(H) 102(H)  BUN 6 - 20 mg/dL 14 11 18   Creatinine 0.44 - 1.00 mg/dL 0.78 0.95 1.00  Sodium 135 - 145 mmol/L 137 139 141  Potassium 3.5 - 5.1 mmol/L 3.8 4.0 4.1  Chloride 98 - 111 mmol/L 105 105 104  CO2 22 - 32 mmol/L 23 22 22   Calcium 8.9 - 10.3 mg/dL 9.3 9.7 9.6  Total Protein 6.5 - 8.1 g/dL 7.2 7.8 -  Total Bilirubin 0.3 - 1.2 mg/dL 0.9 0.5 -  Alkaline Phos 38 - 126 U/L 83 76 -  AST 15 - 41 U/L 19 18 -  ALT 0 - 44 U/L 26 24 -    Lipid Panel     Component Value Date/Time   CHOL 181 11/03/2015 1017   TRIG 97 11/03/2015 1017   HDL 57 11/03/2015 1017   CHOLHDL 3.2 11/03/2015 1017   VLDL 19 11/03/2015 1017   LDLCALC 105 11/03/2015 1017    CBC    Component Value Date/Time   WBC 7.0 01/30/2021 1811   RBC 4.32 01/30/2021 1811   HGB 13.9 01/30/2021 1811   HGB 15.5 08/08/2019 1702   HGB 15.0 07/08/2015 1441   HCT 41.1 01/30/2021 1811   HCT 44.6 08/08/2019 1702   HCT 45.5 07/08/2015 1441   PLT 265 01/30/2021 1811   PLT 257 08/08/2019 1702   MCV 95.1 01/30/2021 1811   MCV 96 08/08/2019 1702   MCV 96.1 07/08/2015 1441   MCH 32.2 01/30/2021 1811   MCHC 33.8 01/30/2021 1811    RDW 12.0 01/30/2021 1811   RDW 11.9 08/08/2019 1702   RDW 12.4 07/08/2015 1441   LYMPHSABS 2.2 01/30/2021 1811   LYMPHSABS 2.0 08/08/2019 1702   LYMPHSABS 1.1 07/08/2015 1441   MONOABS 0.5 01/30/2021 1811   MONOABS  0.6 07/08/2015 1441   EOSABS 0.2 01/30/2021 1811   EOSABS 0.2 08/08/2019 1702   BASOSABS 0.0 01/30/2021 1811   BASOSABS 0.0 08/08/2019 1702   BASOSABS 0.1 07/08/2015 1441    Lab Results  Component Value Date   HGBA1C 5.5 04/04/2015    Assessment & Plan:  1. Herpes zoster without complication Counseled on contact precautions and transmission - valACYclovir (VALTREX) 500 MG tablet; Take 1 tablet (500 mg total) by mouth 2 (two) times daily.  Dispense: 14 tablet; Refill: 0 - gabapentin (NEURONTIN) 300 MG capsule; Take 1 capsule (300 mg total) by mouth at bedtime.  Dispense: 30 capsule; Refill: 3   Meds ordered this encounter  Medications   valACYclovir (VALTREX) 500 MG tablet    Sig: Take 1 tablet (500 mg total) by mouth 2 (two) times daily.    Dispense:  14 tablet    Refill:  0   gabapentin (NEURONTIN) 300 MG capsule    Sig: Take 1 capsule (300 mg total) by mouth at bedtime.    Dispense:  30 capsule    Refill:  3    Follow-up: Return if symptoms worsen or fail to improve.       Charlott Rakes, MD, FAAFP. [facility] and [place], [place]   04/28/2021, 4:22 PM

## 2021-04-28 NOTE — Telephone Encounter (Signed)
PS Carlos ID # [phone] connected to call.  Pt called, states she has small tiny bumps on her chin area that has came up and been there for 2 weeks. She went to pharmacy to get something OTC and they advised her to contact her PCP to see about possible infection. Pt says that bumps hurt and itch and is concerned about this. Appt scheduled for today at 1550 with Dr. Margarita Rana. Pt advised to clean area and not to apply anything prior to appt. Care advice given and pt verbalized understanding. No other questions/concerns noted.     Summary: advice- possible infection   Pt stated she thought she had a cold sore but talked to the pharmacist who advised her to contact her PCP since it could possibly be an infection, pt stated it is also itching, pt thinks she may need antibiotics and wanted an appt, no appt available, pt needed advice.      Reason for Disposition  Sores last > 2 weeks  Answer Assessment - Initial Assessment Questions 1. APPEARANCE of BLISTERS: "Describe the sores."     Small blisters around the chin and lip area  2. SIZE: "How large an area is involved with the cold sores?" (e.g., inches, cm or compare to coins)     Small  3. LOCATION: "Which part of the lip is involved?"     On the chin and cheek area  4. ONSET: "When did the fever blisters begin?"     2 weeks ago  5. RECURRENT BLISTERS: "Have you had fever blisters before?" If Yes, ask: "When was the last time?" "How many times a year?"     No 6. OTHER SYMPTOMS: "Do you have any other symptoms?" (e.g., fever, sores inside mouth)     No 7. PREGNANCY: "Is there any chance you are pregnant?" "When was your last menstrual period?"     No  Protocols used: Cold Sores (Fever Blisters)-A-AH

## 2021-04-28 NOTE — Patient Instructions (Signed)
Herpes labial Cold Sore El herpes labial, tambin llamado boquera, es una pequea ampolla llena de lquido que se forma dentro de la boca o en los labios, las encas, la nariz, el mentn o las [place]. El herpes labial puede diseminarse a otras partes del cuerpo, como los ojos o los dedos. En algunas personas que tienen otras afecciones, el herpes labial puede diseminarse a muchos otros lugares del cuerpo, incluidos los genitales. Se puede transmitir de [place] persona a otra (es contagioso) hasta que las ampollas se cicatrizan por completo. La mayora de los herpes labiales desaparecen en el trmino de 2 semanas. Cules son las causas? Los herpes labiales son causados por la infeccin con un tipo comn del virus del herpes simple (VHS-1). El VHS 1 est relacionado estrechamente con el VHS-2,el cual es el virus que causa el herpes genital, pero estos virus no son iguales. Una vez que una persona se infecta con el VHS-1, el virus permanece para siempre en el organismo. El VHS-1 se contagia de Ardelia Mems persona a otra a travs del contacto directo, como al besarse, tocar la zona afectada, o compartir elementos personales como protector labial, afeitadoras, vasos o cubiertos. Qu incrementa el riesgo? Es ms probable que tenga esta afeccin si: Est cansado, estresado o enfermo. Est menstruando. Est embarazada. Toma ciertos medicamentos. Se expone al clima fro o al sol en exceso. Cules son los signos o los sntomas? Los sntomas de un brote de herpes labial pasan por diferentes etapas. Estas son las etapas del herpes labial: [place] antes del brote, podr sentir pinchazos, picazn o ardor. Aparecen ampollas llenas de lquido en los labios, dentro de la boca, en la nariz o en las mejillas. Las ampollas comienzan a supurar un lquido Engineer, structural. Las ampollas se secan y aparece una costra amarillenta en su lugar. La costra se cae. En algunos casos, pueden presentarse otros sntomas durante un brote de  herpes labial. Estos pueden incluir los siguientes: Fiebre. Dolor de Investment banker, operational. Dolor de [place]. Dolores musculares. Ganglios del cuello inflamados. Cmo se diagnostica? Esta afeccin se diagnostica en funcin de sus antecedentes mdicos y de un examen fsico. El mdico puede hacerle un anlisis de sangre o tomar lquido de la ampolla con un hisopo para luego examinarlo en el laboratorio. Cmo se trata? No existe cura para el herpes labial o VHS-1. Tampoco hay vacuna para el VHS-1. La mayora de los herpes labiales desaparecen sin tratamiento en el trmino de 2 semanas. Aunque no hay medicamentos que puedan eliminar la infeccin, el mdico puede recetarle medicamentos para: Ayudar a Estate manager/land agent del dolor asociado a las ampollas. Impedir que el virus se multiplique. Acortar el tiempo de cicatrizacin. Los medicamentos pueden presentarse en forma de crema, gel, pldoras o inyeccin. Siga estas indicaciones en su casa: [place] o aplquese los medicamentos de venta libre y los recetados solamente como se lo haya indicado el mdico. Use un hisopo de algodn para aplicar crema o gel en las ampollas. Pregntele al mdico si puede tomar suplementos de lisina. Se ha descubierto en investigaciones que la lisina puede ayudar a que los herpes labiales cicatricen ms rpido y a Chemical engineer brotes. Cuidado de las ampollas   No se toque las ampollas ni retire las Surveyor, mining. Lvese las manos con frecuencia. No se toque los ojos sin antes lavarse las manos. [place] y secas. Si se lo indican, aplique hielo sobre las ampollas: Ponga el hielo en una bolsa plstica. Coloque una toalla entre la piel y  la bolsa. Coloque el hielo durante 20 minutos, 2 a 3 veces por da. Comida y bebida Consuma una dieta liviana y blanda. Evite consumir alimentos calientes, fros o salados. Use un sorbete si beber lquidos de un vaso le causa dolor. Coma alimentos ricos en lisina, como carne  vacuna, pescado y lcteos. Evite los alimentos azucarados, los chocolates, los frutos secos y los cereales. Estos alimentos son ricos en un nutriente que se llama arginina, el cual puede hacer que el virus se multiplique. Estilo de vida No bese, no mantenga sexo oral ni comparta artculos de uso personal hasta que las ampollas se Glass blower/designer. El estrs, el no dormir lo suficiente y Naval architect afuera al sol pueden desencadenar brotes. Asegrese de hacer lo siguiente: Realice actividades que lo ayuden a Nurse, children's, como ejercicios de respiracin profunda o meditacin. Duerma lo suficiente. Aplquese pantalla solar en los labios antes de salir al sol. Comunquese con un mdico si: Tiene sntomas durante ms de 2 semanas. Le sale pus de las ampollas. El enrojecimiento se expande. Siente dolor o irritacin en el ojo. Tiene ampollas en los genitales. Las ampollas no se curan en el trmino de 2 semanas. Tiene brotes frecuentes de herpes labiales. Solicite ayuda inmediatamente si tiene: Cristy Hilts y los sntomas empeoran repentinamente. Dolor de [place] y confusin. Fatiga o prdida del apetito. Rigidez en el cuello o sensibilidad a la luz. Resumen El herpes labial, tambin llamado boquera, es una pequea ampolla llena de lquido que se forma dentro de la boca o en los labios, las encas, la nariz, el mentn o las [place]. La mayora de los herpes labiales desaparecen sin tratamiento en el trmino de 2 semanas. El mdico puede recetarle medicamentos para ayudar a Company secretary, impedir que el virus se multiplique y Paediatric nurse de Development worker, community. Lvese las manos con frecuencia. No se toque los ojos sin antes lavarse las manos. No bese, no mantenga sexo oral ni comparta artculos de uso personal hasta que las ampollas se cicatricen. Comunquese con un mdico si las ampollas no cicatrizan en el trmino de 2 semanas. Esta informacin no tiene Marine scientist el consejo del mdico.  Asegrese de hacerle al mdico cualquier pregunta que tenga. Document Revised: 11/16/2017 Document Reviewed: 11/16/2017 Elsevier Patient Education  [place].

## 2021-06-17 ENCOUNTER — Other Ambulatory Visit: Payer: Self-pay

## 2021-06-17 ENCOUNTER — Other Ambulatory Visit: Payer: Self-pay | Admitting: Family Medicine

## 2021-06-17 MED ORDER — NAPROXEN 500 MG PO TABS
500.0000 mg | ORAL_TABLET | Freq: Two times a day (BID) | ORAL | 1 refills | Status: DC
  Filled 2021-06-17: qty 60, 30d supply, fill #0

## 2021-06-17 NOTE — Telephone Encounter (Signed)
Medication Refill - Medication: naproxen (NAPROSYN) 500 MG   and   linaclotide (LINZESS) 290 MCG CAPS   Has the patient contacted their pharmacy? No. Stated she usually calls the office  (Agent: If no, request that the patient contact the pharmacy for the refill. If patient does not wish to contact the pharmacy document the reason why and proceed with request.) (Agent: If yes, when and what did the pharmacy advise?)  Preferred Pharmacy (with phone number or street name):  [facility] Phone:  [phone]  Fax:  [phone]     Has the patient been seen for an appointment in the last year OR does the patient have an upcoming appointment? Yes.    Agent: Please be advised that RX refills may take up to 3 business days. We ask that you follow-up with your pharmacy.

## 2021-06-17 NOTE — Telephone Encounter (Signed)
Requested Prescriptions  Pending Prescriptions Disp Refills   naproxen (NAPROSYN) 500 MG tablet 60 tablet 1    Sig: Take 1 tablet (500 mg total) by mouth 2 (two) times daily with a meal.     Analgesics:  NSAIDS Passed - 06/17/2021  3:28 PM      Passed - Cr in normal range and within 360 days    Creatinine  Date Value Ref Range Status  07/08/2015 0.8 0.6 - 1.1 mg/dL Final   Creat  Date Value Ref Range Status  04/07/2016 0.76 0.50 - 1.10 mg/dL Final   Creatinine, Ser  Date Value Ref Range Status  01/30/2021 0.78 0.44 - 1.00 mg/dL Final         Passed - HGB in normal range and within 360 days    Hemoglobin  Date Value Ref Range Status  01/30/2021 13.9 12.0 - 15.0 g/dL Final  08/08/2019 15.5 11.1 - 15.9 g/dL Final   HGB  Date Value Ref Range Status  07/08/2015 15.0 11.6 - 15.9 g/dL Final         Passed - Patient is not pregnant      Passed - Valid encounter within last 12 months    Recent Outpatient Visits          1 month ago Herpes zoster without complication   [place], [place], MD   9 months ago Carpal tunnel syndrome of right wrist   [place], [place], [place]   1 year ago Other fatigue   [place], Charlane Ferretti, MD   1 year ago Acute bilateral low back pain, unspecified whether sciatica present   [place], [place], NP   1 year ago Chronic bilateral low back pain without sciatica   [place], [place], NP      Future Appointments            In 6 days Charlott Rakes, MD [place]

## 2021-06-18 ENCOUNTER — Other Ambulatory Visit: Payer: Self-pay

## 2021-06-23 ENCOUNTER — Ambulatory Visit: Payer: Self-pay | Admitting: Family Medicine

## 2021-10-16 ENCOUNTER — Ambulatory Visit: Payer: Self-pay

## 2021-10-16 ENCOUNTER — Other Ambulatory Visit: Payer: Self-pay

## 2021-10-16 MED ORDER — LINACLOTIDE 290 MCG PO CAPS
290.0000 ug | ORAL_CAPSULE | Freq: Every day | ORAL | 0 refills | Status: DC
  Filled 2021-10-16: qty 90, 90d supply, fill #0

## 2021-10-16 NOTE — Telephone Encounter (Signed)
Requested Prescriptions  Pending Prescriptions Disp Refills  . linaclotide (LINZESS) 290 MCG CAPS capsule 30 capsule 3    Sig: Take 1 capsule (290 mcg total) by mouth daily before breakfast.     Gastroenterology: Irritable Bowel Syndrome Passed - 10/16/2021  5:58 PM      Passed - Valid encounter within last 12 months    Recent Outpatient Visits          5 months ago Herpes zoster without complication   [place], Enobong, MD   1 year ago Carpal tunnel syndrome of right wrist   [place], [place], [place]   1 year ago Other fatigue   [place], Charlane Ferretti, MD   1 year ago Acute bilateral low back pain, unspecified whether sciatica present   [place], [place], NP   1 year ago Chronic bilateral low back pain without sciatica   [place], [place], NP      Future Appointments            In 3 weeks [place], [place]

## 2021-10-16 NOTE — Telephone Encounter (Signed)
Pt called back to reschedule appt d/t Dr. Margarita Rana being off on 10/20/21. Pt rescheduled appt for 11/12/21 at 1550.

## 2021-10-16 NOTE — Telephone Encounter (Signed)
Crystal [name] #[id number]  Chief Complaint: abdominal pain  Symptoms: low and near ovaries, constipation Frequency: 2 months Pertinent Negatives:NA Disposition: '[]'$ ED /'[]'$ Urgent Care (no appt availability in office) / '[x]'$ Appointment(In office/virtual)/ '[]'$  [facility]/ '[]'$ Home Care/ '[]'$ Refused Recommended Disposition /'[]'$ [facility]/ '[]'$  Follow-up with PCP Additional Notes: pt states she has the above symptoms and wanted to be seen. Pt also having constipation issues. Advised she can take Miralax or stool softners but pt said she has tried those and doesn't help. She normally takes Linzess but not helping lately. Advised pt schedule for appt open on 10/20/21 at 1610.   Reason for Disposition  [1] MODERATE pain (e.g., interferes with normal activities) AND [2] pain comes and goes (cramps) AND [3] present > 24 hours  (Exception: pain with Vomiting or Diarrhea - see that Guideline)  Answer Assessment - Initial Assessment Questions 1. LOCATION: "Where does it hurt?"      Lower abdominal  2. RADIATION: "Does the pain shoot anywhere else?" (e.g., chest, back)     Back and pelvic area 3. ONSET: "When did the pain begin?" (e.g., minutes, hours or days ago)      2 months 5. PATTERN "Does the pain come and go, or is it constant?"    - If constant: "Is it getting better, staying the same, or worsening?"      (Note: Constant means the pain never goes away completely; most serious pain is constant and it progresses)     - If intermittent: "How long does it last?" "Do you have pain now?"     (Note: Intermittent means the pain goes away completely between bouts)     constant 6. SEVERITY: "How bad is the pain?"  (e.g., Scale 1-10; mild, moderate, or severe)   - MILD (1-3): doesn't interfere with normal activities, abdomen soft and not tender to touch    - MODERATE (4-7): interferes with normal activities or awakens from sleep, abdomen tender to touch    - SEVERE (8-10): excruciating pain, doubled  over, unable to do any normal activities      4 during the day and 7/8 during night 10. OTHER SYMPTOMS: "Do you have any other symptoms?" (e.g., back pain, diarrhea, fever, urination pain, vomiting)       constipation  Protocols used: Abdominal Pain - Female-A-AH

## 2021-10-20 ENCOUNTER — Ambulatory Visit: Payer: Self-pay | Admitting: Family Medicine

## 2021-10-20 ENCOUNTER — Other Ambulatory Visit: Payer: Self-pay

## 2021-10-21 ENCOUNTER — Other Ambulatory Visit: Payer: Self-pay

## 2021-11-03 ENCOUNTER — Other Ambulatory Visit: Payer: Self-pay

## 2021-11-12 ENCOUNTER — Ambulatory Visit: Payer: Self-pay | Admitting: Physician Assistant

## 2021-11-17 ENCOUNTER — Other Ambulatory Visit: Payer: Self-pay

## 2021-12-23 ENCOUNTER — Ambulatory Visit: Payer: Self-pay | Attending: Physician Assistant | Admitting: Physician Assistant

## 2021-12-23 ENCOUNTER — Other Ambulatory Visit: Payer: Self-pay

## 2021-12-23 ENCOUNTER — Other Ambulatory Visit (HOSPITAL_COMMUNITY)
Admission: RE | Admit: 2021-12-23 | Discharge: 2021-12-23 | Disposition: A | Discharge status: Home / Self Care | Payer: Self-pay | Source: Ambulatory Visit | Attending: Physician Assistant | Admitting: Physician Assistant

## 2021-12-23 VITALS — BP 124/77 | HR 79 | Ht 59.0 in | Wt 114.6 lb

## 2021-12-23 DIAGNOSIS — K859 Acute pancreatitis without necrosis or infection, unspecified: Secondary | ICD-10-CM | POA: Insufficient documentation

## 2021-12-23 DIAGNOSIS — N898 Other specified noninflammatory disorders of vagina: Secondary | ICD-10-CM | POA: Insufficient documentation

## 2021-12-23 DIAGNOSIS — R8281 Pyuria: Secondary | ICD-10-CM

## 2021-12-23 DIAGNOSIS — M549 Dorsalgia, unspecified: Secondary | ICD-10-CM

## 2021-12-23 DIAGNOSIS — L989 Disorder of the skin and subcutaneous tissue, unspecified: Secondary | ICD-10-CM

## 2021-12-23 DIAGNOSIS — G4700 Insomnia, unspecified: Secondary | ICD-10-CM

## 2021-12-23 DIAGNOSIS — R109 Unspecified abdominal pain: Secondary | ICD-10-CM

## 2021-12-23 DIAGNOSIS — R300 Dysuria: Secondary | ICD-10-CM

## 2021-12-23 DIAGNOSIS — K861 Other chronic pancreatitis: Secondary | ICD-10-CM

## 2021-12-23 LAB — POCT URINALYSIS DIP (CLINITEK)
Bilirubin, UA: NEGATIVE
Blood, UA: NEGATIVE
Glucose, UA: NEGATIVE mg/dL
Ketones, POC UA: NEGATIVE mg/dL
Nitrite, UA: NEGATIVE
POC PROTEIN,UA: NEGATIVE
Spec Grav, UA: 1.015 (ref 1.010–1.025)
Urobilinogen, UA: 0.2 E.U./dL
pH, UA: 7 (ref 5.0–8.0)

## 2021-12-23 MED ORDER — TRAZODONE HCL 50 MG PO TABS
25.0000 mg | ORAL_TABLET | Freq: Every evening | ORAL | 3 refills | Status: DC | PRN
  Filled 2021-12-23: qty 30, 30d supply, fill #0

## 2021-12-23 MED ORDER — NITROFURANTOIN MONOHYD MACRO 100 MG PO CAPS
100.0000 mg | ORAL_CAPSULE | Freq: Two times a day (BID) | ORAL | 0 refills | Status: DC
  Filled 2021-12-23: qty 10, 5d supply, fill #0

## 2021-12-23 MED ORDER — NAPROXEN 500 MG PO TABS
500.0000 mg | ORAL_TABLET | Freq: Two times a day (BID) | ORAL | 1 refills | Status: DC
  Filled 2021-12-23: qty 60, 30d supply, fill #0
  Filled 2022-01-19: qty 60, 30d supply, fill #1

## 2021-12-23 MED ORDER — METHOCARBAMOL 500 MG PO TABS
500.0000 mg | ORAL_TABLET | Freq: Three times a day (TID) | ORAL | 0 refills | Status: DC | PRN
  Filled 2021-12-23: qty 90, 30d supply, fill #0

## 2021-12-23 NOTE — Progress Notes (Unsigned)
Patient ID: Crystal [name], female   DOB: 05-15-1971, 51 y.o.   MRN: [id number]   Crystal [name], is a 51 y.o. female  KCL:[id number]  CBS:[id number]  DOB - [birth year]  Chief Complaint  Patient presents with   Abdominal Pain   Insomnia       Subjective:   Crystal [name] is a 51 y.o. female here today for multiple concerns. Her daughter is here to help with translation.    Constipation and taking linzess-last seen by GI 01/2021 and never went for f/up.  At first she felt it really helped but ot as much over the last few months.  Has h/o mild ongoing pancreatitis.  Denies N/V/D.  No melena/hematochezia.  Still has intermittent abdominal pain that is not new  Burning with urinationX 2 days  Vaginal discharge for several weeks  Pain in back/flank area-also not new and intermittent.   Mole on face she is concerned about.  Not bleeding but has been itching lately  Takes forever to go to sleep and only sleeps few hours   Problem  Acute Pancreatitis    ALLERGIES: Allergies  Allergen Reactions   Contrast Media [Iodinated Contrast Media] Itching    13 hr prep in the future; itching of tongue and around the mouth 03/02/2019    PAST MEDICAL HISTORY: Past Medical History:  Diagnosis Date   Allergy    Cancer ([place])    cervical cancer dx.    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 Authorizing Provider  methocarbamol (ROBAXIN) 500 MG tablet Take 1 tablet (500 mg total) by mouth every 8 (eight) hours as needed for muscle spasms. 12/23/21  Yes Marleny Faller, Dionne Bucy, PA-C  nitrofurantoin, macrocrystal-monohydrate, (MACROBID) 100 MG capsule Take 1 capsule (100 mg total) by mouth 2 (two)  times daily. 12/23/21  Yes Argentina Donovan, PA-C  traZODone (DESYREL) 50 MG tablet Take 0.5-1 tablets (25-50 mg total) by mouth at bedtime as needed for sleep. 12/23/21  Yes Argentina Donovan, PA-C  linaclotide (LINZESS) 290 MCG CAPS capsule Take 1 capsule (290 mcg total) by mouth daily before breakfast. 10/16/21   Charlott Rakes, MD  naproxen (NAPROSYN) 500 MG tablet Take 1 tablet (500 mg total) by mouth 2 (two) times daily with a meal. 12/23/21   Kaisley Stiverson, Dionne Bucy, PA-C  valACYclovir (VALTREX) 500 MG tablet Take 1 tablet (500 mg total) by mouth 2 (two) times daily. 04/28/21   Charlott Rakes, MD    ROS: Neg HEENT Neg resp Neg cardiac Neg psych Neg neuro  Objective:   Vitals:   12/23/21 1554  BP: 124/77  Pulse: 79  SpO2: 96%  Weight: 114 lb 9.6 oz (52 kg)  Height: '4\' 11"'$  (1.499 m)   Exam General appearance : Awake, alert, not in any distress. Speech Clear. Not toxic looking HEENT: Atraumatic and Normocephalic Neck: Supple, no JVD. No cervical lymphadenopathy.  Chest: Good air entry bilaterally, CTAB.  No rales/rhonchi/wheezing CVS: S1 S2 regular, no murmurs.  Abdomen: Bowel sounds present, Non tender and not distended with no gaurding, rigidity or rebound. Extremities: B/L Lower Ext shows no edema, both legs are warm to touch Neurology: Awake  alert, and oriented X 3, CN II-XII intact, Non focal Skin: 69m actinic keratosis under L eye.  Regular margins,  regular shape.  Not friable  Data Review Lab Results  Component Value Date   HGBA1C 5.5 04/04/2015    Assessment & Plan   1. Abdominal pain, unspecified abdominal location Non-acute abdomen - POCT URINALYSIS DIP (CLINITEK) - Comprehensive metabolic panel - Lipase - Ambulatory referral to Gastroenterology - Cervicovaginal ancillary only  2. Dysuria See #6 - Urine Culture  3. Vaginal discharge - Cervicovaginal ancillary only  4. Back pain, unspecified back location, unspecified back pain laterality, unspecified  chronicity No red flags - naproxen (NAPROSYN) 500 MG tablet; Take 1 tablet (500 mg total) by mouth 2 (two) times daily with a meal.  Dispense: 60 tablet; Refill: 1 - methocarbamol (ROBAXIN) 500 MG tablet; Take 1 tablet (500 mg total) by mouth every 8 (eight) hours as needed for muscle spasms.  Dispense: 90 tablet; Refill: 0  5. Lesion of face - Ambulatory referral to Dermatology  6. Pyuria Rx macrobid bid for 5 days - Urine Culture  7. Chronic pancreatitis, unspecified pancreatitis type ([place] - Lipase - Ambulatory referral to Gastroenterology  8. Insomnia, unspecified type - traZODone (DESYREL) 50 MG tablet; Take 0.5-1 tablets (25-50 mg total) by mouth at bedtime as needed for sleep.  Dispense: 30 tablet; Refill: 3    Return in about 6 months (around 06/25/2022) for PCP chronic conditions.  The patient was given clear instructions to go to ER or return to medical center if symptoms don't improve, worsen or new problems develop. The patient verbalized understanding. The patient was told to call to get lab results if they haven't heard anything in the next week.      AFreeman Caldron PA-C [facility]  12/23/2021, 4:19 PM

## 2021-12-24 LAB — COMPREHENSIVE METABOLIC PANEL
ALT: 36 IU/L — ABNORMAL HIGH (ref 0–32)
AST: 21 IU/L (ref 0–40)
Albumin/Globulin Ratio: 1.4 (ref 1.2–2.2)
Albumin: 4.6 g/dL (ref 3.8–4.9)
Alkaline Phosphatase: 105 IU/L (ref 44–121)
BUN/Creatinine Ratio: 20 (ref 9–23)
BUN: 18 mg/dL (ref 6–24)
Bilirubin Total: 0.4 mg/dL (ref 0.0–1.2)
CO2: 22 mmol/L (ref 20–29)
Calcium: 9.6 mg/dL (ref 8.7–10.2)
Chloride: 100 mmol/L (ref 96–106)
Creatinine, Ser: 0.88 mg/dL (ref 0.57–1.00)
Globulin, Total: 3.2 g/dL (ref 1.5–4.5)
Glucose: 91 mg/dL (ref 70–99)
Potassium: 4 mmol/L (ref 3.5–5.2)
Sodium: 138 mmol/L (ref 134–144)
Total Protein: 7.8 g/dL (ref 6.0–8.5)
eGFR: 79 mL/min/{1.73_m2} (ref >59)

## 2021-12-24 LAB — LIPASE: Lipase: 52 U/L (ref 14–72)

## 2021-12-25 LAB — URINE CULTURE

## 2021-12-25 LAB — CERVICOVAGINAL ANCILLARY ONLY
Bacterial Vaginitis (gardnerella): NEGATIVE
Candida Glabrata: NEGATIVE
Candida Vaginitis: NEGATIVE
Chlamydia: NEGATIVE
Comment: NEGATIVE
Comment: NEGATIVE
Comment: NEGATIVE
Comment: NEGATIVE
Comment: NEGATIVE
Comment: NORMAL
Neisseria Gonorrhea: NEGATIVE
Trichomonas: NEGATIVE

## 2022-01-13 ENCOUNTER — Other Ambulatory Visit: Payer: Self-pay

## 2022-01-13 DIAGNOSIS — N644 Mastodynia: Secondary | ICD-10-CM

## 2022-01-19 ENCOUNTER — Other Ambulatory Visit (HOSPITAL_COMMUNITY): Payer: Self-pay

## 2022-01-20 ENCOUNTER — Other Ambulatory Visit (HOSPITAL_COMMUNITY): Payer: Self-pay

## 2022-02-16 ENCOUNTER — Other Ambulatory Visit: Payer: Self-pay

## 2022-03-02 ENCOUNTER — Ambulatory Visit: Payer: Self-pay

## 2022-03-02 ENCOUNTER — Ambulatory Visit: Payer: Self-pay | Admitting: *Deleted

## 2022-03-02 ENCOUNTER — Ambulatory Visit
Admission: RE | Admit: 2022-03-02 | Discharge: 2022-03-02 | Disposition: A | Discharge status: Home / Self Care | Payer: No Typology Code available for payment source | Source: Ambulatory Visit | Attending: Obstetrics and Gynecology | Admitting: Obstetrics and Gynecology

## 2022-03-02 VITALS — BP 118/76 | Wt 112.8 lb

## 2022-03-02 DIAGNOSIS — Z1211 Encounter for screening for malignant neoplasm of colon: Secondary | ICD-10-CM

## 2022-03-02 DIAGNOSIS — N644 Mastodynia: Secondary | ICD-10-CM

## 2022-03-02 DIAGNOSIS — Z01419 Encounter for gynecological examination (general) (routine) without abnormal findings: Secondary | ICD-10-CM

## 2022-03-02 NOTE — Progress Notes (Signed)
Crystal [name] is a 51 y.o. [id number] female who presents to [facility] clinic today with no complaints right outer breast pain x February 2022. Patient states the pain comes and goes. Patient rates the pain at a 5 out of 10. Patient stated the pain has increased since her previous visit with [facility] 10/09/2020. Patient complained of diffuse left breast pain x one month that comes and goes. Patient rates the pain at a 3 out of 10.   Pap Smear: Pap smear completed today. Last Pap smear was 03/12/2020 at the [facility] and normal with negative HPV. Patient has a history of an abnormal Pap smear 02/26/2015 at the [facility] that was HGSIL. 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 Patient had a complete hysterectomy 07/01/2015 for cervical cancer. Per patient her Pap smear on 02/26/2015 is the only abnormal Pap smear she has had. Last five Pap smears, colposcopy, LEEP, and hysterectomy results are all in EPIC.   Physical exam: Breasts Breasts symmetrical. No skin abnormalities bilateral breasts. No nipple retraction bilateral breasts. No nipple discharge bilateral breasts. No lymphadenopathy. No lumps palpated bilateral breasts. Complaints of right outer and upper breast tenderness on exam. Complaints of left outer breast tenderness on exam.  MS DIGITAL DIAG TOMO BILAT  Result Date: 10/09/2020 CLINICAL DATA:  History of bilateral breast pain. History of breast cysts. History of LEFT breast surgical excision for LCIS in 2017. EXAM: DIGITAL DIAGNOSTIC BILATERAL MAMMOGRAM WITH TOMOSYNTHESIS AND CAD TECHNIQUE: Bilateral digital diagnostic mammography and breast tomosynthesis was performed. The images were evaluated with computer-aided detection. COMPARISON:  Previous exam(s). ACR Breast Density Category d: The  breast tissue is extremely dense, which lowers the sensitivity of mammography. FINDINGS: There are no new dominant masses, suspicious calcifications or secondary signs of malignancy within either breast. There are stable postsurgical changes within the LEFT breast. IMPRESSION: No evidence of malignancy within either breast. Stable postsurgical changes within the LEFT breast. RECOMMENDATION: 1.  Screening mammogram in one year.(Code:SM-B-01Y) 2. Benign causes of breast pain were discussed with the patient. Patient was encouraged to follow-up with referring physician if a palpable lump developed. I have discussed the findings and recommendations with the patient. If applicable, a reminder letter will be sent to the patient regarding the next appointment. BI-RADS CATEGORY  2: Benign. Electronically Signed   By: Franki Cabot M.D.   On: 10/09/2020 13:11   MM DIAG BREAST TOMO BILATERAL  Result Date: 10/13/2017 CLINICAL DATA:  52 year old female with palpable right breast abnormality and associated intermittent pain. 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 EXAM: DIGITAL DIAGNOSTIC BILATERAL MAMMOGRAM WITH CAD AND TOMO ULTRASOUND RIGHT BREAST COMPARISON:  Previous exam(s). ACR Breast Density Category d: The breast tissue is extremely dense, which lowers the sensitivity of mammography. FINDINGS: Radiopaque BB was placed at the site of the patient's palpable abnormality/pain in the upper outer quadrant of the right breast. There is underlying dense tissue with suggestion of a circumscribed mass. No additional suspicious findings are identified in either breast. Mammographic images were processed with CAD. Targeted ultrasound is performed, showing a circumscribed, anechoic cyst at the 10 o'clock position 4 cm from the nipple. It measures 0.9 x 0.9 x 0.8 cm. There is no internal vascularity. IMPRESSION: 1. Benign simple cyst in the right breast corresponding with  the patient's palpable  abnormality. No further imaging follow-up required. 2. No mammographic evidence of malignancy in either breast. RECOMMENDATION: Screening mammogram in one year.(Code:SM-B-01Y) I have discussed the findings and recommendations with the patient. Results were also provided in writing at the conclusion of the visit. If applicable, a reminder letter will be sent to the patient regarding the next appointment. BI-RADS CATEGORY  2: Benign. Electronically Signed   By: Kristopher Oppenheim M.D.   On: 10/13/2017 12:00       Pelvic/Bimanual Ext Genitalia No lesions, no swelling and no discharge observed on external genitalia.        Vagina Vagina pink and normal texture. No lesions or discharge observed in vagina.        Cervix Cervix is absent due to history of hysterectomy for cervical cancer.   Uterus Uterus is absent due to history of hysterectomy for cervical cancer.     Adnexae Bilateral ovaries present and palpable. Complaints of tenderness when palpated right ovary. Offered to refer patient to the [facility] for [place] for follow up. Explained to patient that [facility] will not cover the follow and gave information about the [facility]. Patient stated she would like a referral. Let patient know that the [facility] will call her to schedule appointment. Referral sent.     Rectovaginal No rectal exam completed today since patient had no rectal complaints. No skin abnormalities observed on exam.     Smoking History: Patient has never smoked.   Patient Navigation: Patient education provided. Access to services provided for patient through [facility] program. Spanish interpreter Geraldo Pitter from [facility] provided.   Colorectal Cancer Screening: Per patient has had colonoscopy completed on 03/01/2018 at [place]. FIT Test given to patient to complete. No complaints today.    Breast and Cervical Cancer Risk Assessment: Patient does not have family  history of breast cancer, known genetic mutations, or radiation treatment to the chest before age 39. Patient has history of cervical cancer. Patient has no history of being immunocompromised or DES exposure in-utero. Unable to complete G[place] risk assessment due to patient has history of LCIS and the [place] risk assessment doesn't apply to patient.   Risk Assessment   No risk assessment data    A: [facility] exam with pap smear Complaint of bilateral breast pain.  P: Referred patient to the [facility] for a diagnostic mammogram. Appointment scheduled Tuesday, March 02, 2022 at 1400.  Loletta Parish, RN 03/02/2022 1:29 PM

## 2022-03-02 NOTE — Patient Instructions (Signed)
Explained breast self awareness with Crystal [name]. Pap smear completed today. Let her know if today's Pap smear is normal and HPV negative that her next Pap smear will be due in one year due to her history of cervical cancer. Referred patient to the [facility] for a diagnostic mammogram. Appointment scheduled Tuesday, March 02, 2022 at 1400. Patient aware of appointment and will be there. Let patient know will follow up with her within the next couple weeks with results of Pap smear by phone. Crystal [name] verbalized understanding.  Brexton Sofia, Arvil Chaco, RN 1:29 PM

## 2022-03-05 ENCOUNTER — Telehealth: Payer: Self-pay | Admitting: Emergency Medicine

## 2022-03-05 NOTE — Telephone Encounter (Signed)
Copied from [place] [id number]. Topic: General - Call Back - No Documentation >> Mar 05, 2022  4:44 PM Sabas Sous wrote: Reason for CRM: Pt's Daughter Nita Sells returned call to office. Says she was instructed to do this by the clinic  Best contact: [phone]

## 2022-03-06 LAB — CYTOLOGY - PAP
Adequacy: ABNORMAL
Comment: NEGATIVE
High risk HPV: NEGATIVE

## 2022-03-09 ENCOUNTER — Other Ambulatory Visit: Payer: Self-pay | Admitting: Obstetrics and Gynecology

## 2022-03-11 NOTE — Telephone Encounter (Signed)
Call placed to patient's daughter and VM was left informing patient to return call. After looking through chart patient has not been seen since August and maybe she is calling for lab results from then.

## 2022-03-12 ENCOUNTER — Telehealth: Payer: Self-pay

## 2022-03-12 DIAGNOSIS — L989 Disorder of the skin and subcutaneous tissue, unspecified: Secondary | ICD-10-CM

## 2022-03-12 NOTE — Telephone Encounter (Signed)
Pt given lab results per notes of A. McClung PA on 03/12/22. Pt verbalized understanding. Pt given phone number for [facility] and pt requesting a dermatology referral for a lesion below left eye that has gotten slightly larger. Pt has been seen for this in office in the past per daughter.

## 2022-03-15 NOTE — Addendum Note (Signed)
Addended by: Karle Plumber B on: 03/15/2022 09:18 PM   Modules accepted: Orders

## 2022-03-16 LAB — FECAL OCCULT BLOOD, IMMUNOCHEMICAL: Fecal Occult Bld: NEGATIVE

## 2022-03-22 ENCOUNTER — Telehealth: Payer: Self-pay

## 2022-03-22 NOTE — Telephone Encounter (Signed)
Called patient via Crystal [name] to inform patient that pap smear was unsatisfactory for evaluation and that we would need to bring her back to repeat pap smear. Patient voiced understanding. Patient scheduled @ free screening on 04/05/22 @ 10:30 am.

## 2022-03-26 ENCOUNTER — Emergency Department (HOSPITAL_COMMUNITY): Payer: No Typology Code available for payment source

## 2022-03-26 ENCOUNTER — Encounter (HOSPITAL_COMMUNITY): Payer: Self-pay

## 2022-03-26 ENCOUNTER — Emergency Department (HOSPITAL_COMMUNITY)
Admission: EM | Admit: 2022-03-26 | Discharge: 2022-03-26 | Disposition: A | Discharge status: Home / Self Care | Payer: No Typology Code available for payment source | Attending: Emergency Medicine | Admitting: Emergency Medicine

## 2022-03-26 ENCOUNTER — Other Ambulatory Visit: Payer: Self-pay

## 2022-03-26 DIAGNOSIS — R1084 Generalized abdominal pain: Secondary | ICD-10-CM

## 2022-03-26 DIAGNOSIS — K5900 Constipation, unspecified: Secondary | ICD-10-CM | POA: Insufficient documentation

## 2022-03-26 LAB — CBC WITH DIFFERENTIAL/PLATELET
Abs Immature Granulocytes: 0.02 10*3/uL (ref 0.00–0.07)
Basophils Absolute: 0 10*3/uL (ref 0.0–0.1)
Basophils Relative: 0 %
Eosinophils Absolute: 0.1 10*3/uL (ref 0.0–0.5)
Eosinophils Relative: 1 %
HCT: 44 % (ref 36.0–46.0)
Hemoglobin: 15 g/dL (ref 12.0–15.0)
Immature Granulocytes: 0 %
Lymphocytes Relative: 28 %
Lymphs Abs: 2.3 10*3/uL (ref 0.7–4.0)
MCH: 32.3 pg (ref 26.0–34.0)
MCHC: 34.1 g/dL (ref 30.0–36.0)
MCV: 94.8 fL (ref 80.0–100.0)
Monocytes Absolute: 0.6 10*3/uL (ref 0.1–1.0)
Monocytes Relative: 8 %
Neutro Abs: 5.1 10*3/uL (ref 1.7–7.7)
Neutrophils Relative %: 63 %
Platelets: 261 10*3/uL (ref 150–400)
RBC: 4.64 MIL/uL (ref 3.87–5.11)
RDW: 12 % (ref 11.5–15.5)
WBC: 8.1 10*3/uL (ref 4.0–10.5)
nRBC: 0 % (ref 0.0–0.2)

## 2022-03-26 LAB — URINALYSIS, ROUTINE W REFLEX MICROSCOPIC
Bilirubin Urine: NEGATIVE
Glucose, UA: NEGATIVE mg/dL
Hgb urine dipstick: NEGATIVE
Ketones, ur: NEGATIVE mg/dL
Leukocytes,Ua: NEGATIVE
Nitrite: NEGATIVE
Protein, ur: NEGATIVE mg/dL
Specific Gravity, Urine: 1.008 (ref 1.005–1.030)
pH: 8 (ref 5.0–8.0)

## 2022-03-26 LAB — COMPREHENSIVE METABOLIC PANEL
ALT: 22 U/L (ref 0–44)
AST: 26 U/L (ref 15–41)
Albumin: 4.2 g/dL (ref 3.5–5.0)
Alkaline Phosphatase: 82 U/L (ref 38–126)
Anion gap: 11 (ref 5–15)
BUN: 14 mg/dL (ref 6–20)
CO2: 19 mmol/L — ABNORMAL LOW (ref 22–32)
Calcium: 9.8 mg/dL (ref 8.9–10.3)
Chloride: 107 mmol/L (ref 98–111)
Creatinine, Ser: 0.73 mg/dL (ref 0.44–1.00)
GFR, Estimated: >60 mL/min (ref >60)
Glucose, Bld: 99 mg/dL (ref 70–99)
Potassium: 4 mmol/L (ref 3.5–5.1)
Sodium: 137 mmol/L (ref 135–145)
Total Bilirubin: 1 mg/dL (ref 0.3–1.2)
Total Protein: 7.6 g/dL (ref 6.5–8.1)

## 2022-03-26 LAB — I-STAT BETA HCG BLOOD, ED (MC, WL, AP ONLY): I-stat hCG, quantitative: <5 m[IU]/mL (ref <5)

## 2022-03-26 LAB — LIPASE, BLOOD: Lipase: 54 U/L — ABNORMAL HIGH (ref 11–51)

## 2022-03-26 MED ORDER — POLYETHYLENE GLYCOL 3350 17 G PO PACK: 17.0000 g | PACK | Freq: Two times a day (BID) | ORAL | 0 refills | Status: DC

## 2022-03-26 MED ORDER — ACETAMINOPHEN 500 MG PO TABS
1000.0000 mg | ORAL_TABLET | Freq: Once | ORAL | Status: AC
  Administered 2022-03-26: 1000 mg via ORAL
  Filled 2022-03-26: qty 2

## 2022-03-26 MED ORDER — KETOROLAC TROMETHAMINE 60 MG/2ML IM SOLN
60.0000 mg | Freq: Once | INTRAMUSCULAR | Status: DC
  Filled 2022-03-26 (×2): qty 2

## 2022-03-26 MED ORDER — KETOROLAC TROMETHAMINE 15 MG/ML IJ SOLN
15.0000 mg | Freq: Once | INTRAMUSCULAR | Status: AC
  Administered 2022-03-26: 15 mg via INTRAMUSCULAR

## 2022-03-26 MED ORDER — KETOROLAC TROMETHAMINE 15 MG/ML IJ SOLN
15.0000 mg | Freq: Once | INTRAMUSCULAR | Status: DC
  Filled 2022-03-26: qty 1

## 2022-03-26 MED ORDER — ONDANSETRON HCL 4 MG/2ML IJ SOLN
4.0000 mg | Freq: Once | INTRAMUSCULAR | Status: DC
  Filled 2022-03-26: qty 2

## 2022-03-26 NOTE — ED Provider Notes (Signed)
[facility] EMERGENCY DEPARTMENT Provider Note   CSN: [id number] Arrival date & time: 03/26/22  1257     History Chief Complaint  Patient presents with   Abdominal Pain    HPI Crystal [name] is a 51 y.o. female presenting for chief complaint of diffuse abdominal pain.  She states that she has had 3 weeks of diffuse abdominal pain but it has been worse over the last 3 days.  She feels that it is her constipation flaring up but she has a history of similar.  She has been taking some over-the-counter laxatives without much symptomatic improvement.  She endorses that she is not drinking as much water as she feels like she should be.  She denies fevers or chills, nausea vomiting, syncope shortness of breath.  She otherwise ambulatory tolerating p.o. intake.  She states that she is trying to have a bowel movement 5-10 times per day but only having small rocks come out and set a full size full stool.  She also endorses underlying anxiety related to a history of ovarian cysts and is worried that she is having another episode.  She does state that her pain is mild and never has severe episodes. Interpreter used for all of the above history.  Patient's recorded medical, surgical, social, medication list and allergies were reviewed in the Snapshot window as part of the initial history.   Review of Systems   Review of Systems  Constitutional:  Negative for chills and fever.  HENT:  Negative for ear pain and sore throat.   Eyes:  Negative for pain and visual disturbance.  Respiratory:  Negative for cough and shortness of breath.   Cardiovascular:  Negative for chest pain and palpitations.  Gastrointestinal:  Positive for abdominal pain and constipation. Negative for vomiting.  Genitourinary:  Negative for dysuria and hematuria.  Musculoskeletal:  Negative for arthralgias and back pain.  Skin:  Negative for color change and rash.  Neurological:  Negative for seizures  and syncope.  All other systems reviewed and are negative.   Physical Exam Updated Vital Signs BP 125/66 (BP Location: Right Arm)   Pulse 92   Temp (!) 97.5 F (36.4 C) (Oral)   Resp 18   LMP 07/01/2015   SpO2 100%  Physical Exam Vitals and nursing note reviewed.  Constitutional:      General: She is not in acute distress.    Appearance: She is well-developed.  HENT:     Head: Normocephalic and atraumatic.  Eyes:     Conjunctiva/sclera: Conjunctivae normal.  Cardiovascular:     Rate and Rhythm: Normal rate and regular rhythm.     Heart sounds: No murmur heard. Pulmonary:     Effort: Pulmonary effort is normal. No respiratory distress.     Breath sounds: Normal breath sounds.  Abdominal:     Palpations: Abdomen is soft.     Tenderness: There is no abdominal tenderness. There is no right CVA tenderness, left CVA tenderness or guarding. Negative signs include Murphy's sign.  Musculoskeletal:        General: No swelling.     Cervical back: Neck supple.  Skin:    General: Skin is warm and dry.     Capillary Refill: Capillary refill takes less than 2 seconds.  Neurological:     Mental Status: She is alert.  Psychiatric:        Mood and Affect: Mood normal.      ED Course/ Medical Decision Making/ A&P  Procedures Procedures   Medications Ordered in ED Medications  ketorolac (TORADOL) 15 MG/ML injection 15 mg (has no administration in time range)  acetaminophen (TYLENOL) tablet 1,000 mg (has no administration in time range)   Medical Decision Making:   Crystal [name] is a 51 y.o. female who presented to the ED today with abdominal pain, detailed above.    Patient's presentation is complicated by their history of pancreatitis in the past, multiple medication utilization in the outpatient setting, history of anxiety.  Patient placed on continuous vitals and telemetry monitoring while in ED which was reviewed periodically.  Complete initial physical exam  performed, notably the patient  was hemodynamically stable in no acute distress.  No acute abdominal tenderness at this time.     Reviewed and confirmed nursing documentation for past medical history, family history, social history.    Initial Assessment:   With the patient's presentation of abdominal pain, most likely diagnosis is recurrent constipation in the setting of history of IBS. Other diagnoses were considered including (but not limited to) gastroenteritis, colitis, small bowel obstruction, appendicitis, cholecystitis, pancreatitis, nephrolithiasis, UTI, pyleonephritis, ruptured ectopic pregnancy, PID, ovarian torsion. These are considered less likely due to history of present illness and physical exam findings.   This is most consistent with an acute life/limb threatening illness complicated by underlying chronic conditions.   Initial Plan:  CBC/CMP to evaluate for underlying infectious/metabolic etiology for patient's abdominal pain  Lipase to evaluate for pancreatitis  EKG to evaluate for cardiac source of pain  Right upper quadrant ultrasound to evaluate for structural intra-abdominal etiology of patient's symptoms Urinalysis and repeat physical assessment to evaluate for UTI/Pyelonpehritis  Empiric management of symptoms with escalating pain control and antiemetics as needed.   Initial Study Results:   Laboratory  All laboratory results reviewed without evidence of clinically relevant pathology.      EKG EKG was reviewed independently. Rate, rhythm, axis, intervals all examined and without medically relevant abnormality. ST segments without concerns for elevations.    Radiology All images reviewed independently. Agree with radiology report at this time.   US Abdomen Limited RUQ (LIVER/GB)  Result Date: 03/26/2022 CLINICAL DATA:  Right upper quadrant pain EXAM: ULTRASOUND ABDOMEN LIMITED RIGHT UPPER QUADRANT COMPARISON:  CT abdomen/pelvis 01/31/2021, right upper quadrant  ultrasound 08/09/2017 FINDINGS: Gallbladder: No gallstones or wall thickening visualized. No sonographic Murphy sign noted by sonographer. Common bile duct: Diameter: 6 mm Liver: No focal lesion identified. Within normal limits in parenchymal echogenicity. Portal vein is patent on color Doppler imaging with normal direction of blood flow towards the liver. Other: None. IMPRESSION: Normal right upper quadrant ultrasound. Electronically Signed   By: Valetta Mole M.D.   On: 03/26/2022 15:24   DG Abdomen 1 View  Result Date: 03/26/2022 CLINICAL DATA:  Abdominal pain EXAM: ABDOMEN - 1 VIEW COMPARISON:  CT abdomen 01/31/2021 FINDINGS: No bowel dilatation to suggest obstruction. No evidence of pneumoperitoneum, portal venous gas or pneumatosis. No pathologic calcifications along the expected course of the ureters. Punctate nonobstructing left renal calculi. No acute osseous abnormality. IMPRESSION: 1. Punctate nonobstructing left renal calculi. Electronically Signed   By: Kathreen Devoid M.D.   On: 03/26/2022 14:59   MS DIGITAL DIAG TOMO BILAT  Result Date: 03/02/2022 CLINICAL DATA:  51 year old female presenting for nonfocal bilateral breast pain in the lateral aspect of both breasts for 3 months. She has history of an excisional biopsy in the left breast for LCIS in 2017. EXAM: DIGITAL DIAGNOSTIC BILATERAL MAMMOGRAM WITH TOMOSYNTHESIS  TECHNIQUE: Bilateral digital diagnostic mammography and breast tomosynthesis was performed. COMPARISON:  Previous exam(s). ACR Breast Density Category d: The breast tissue is extremely dense, which lowers the sensitivity of mammography. FINDINGS: No suspicious calcifications, masses or areas of distortion are seen in the bilateral breasts. IMPRESSION: 1. There are no mammographic findings in either breast to correspond with the diffuse bilateral lateral breast pain. 2.  No mammographic evidence of malignancy in the bilateral breasts. RECOMMENDATION: For 1. Breast pain is a common  condition, which will often resolve on its own without intervention. It can be affected by hormonal changes, medication side effect, weight changes and fit of the bra. Pain may also be referred from other adjacent areas of the body. Breast pain may be improved by wearing adequate well-fitting support, over-the-counter topical and oral NSAID medication, low-fat diet, and ice/heat as needed. Studies have shown an improvement in cyclic pain with use of evening primrose oil and vitamin E. Clinical follow-up recommended to discuss any further work-up recommendations and appropriate treatment. 2.  Screening mammogram in one year.(Code:SM-B-01Y) I have discussed the findings and recommendations with the patient. If applicable, a reminder letter will be sent to the patient regarding the next appointment. BI-RADS CATEGORY  1: Negative. Electronically Signed   By: Ammie Ferrier M.D.   On: 03/02/2022 14:53    Final Reassessment and Plan:   Patient treated in the emergency department with Tylenol and Toradol, endorsing improvement of symptoms after these medications.  Notably, before even receiving his medications, patient states that she has had no pain today and that her symptoms are grossly mild.  She denies any new symptoms after 4 hours of observation in the emergency department.  Above work-up grossly reassuring today.  Had a prolonged conversation utilizing interpreter services regarding ongoing care and management.  She states understanding of importance of initiation of laxatives and follow-up with PCP within 48 hours in the outpatient setting.  P  Disposition:  I have considered need for hospitalization, however, patient's current presentation favors discharge at this time.  Patient/family educated about specific return precautions for given chief complaint and symptoms.  Patient/family educated about follow-up with PCP.     Patient/family expressed understanding of return precautions and need for  follow-up. Patient spoken to regarding all imaging and laboratory results and appropriate follow up for these results. All education provided in verbal form with additional information in written form. Time was allowed for answering of patient questions. Patient discharged.    Emergency Department Medication Summary:   Medications  ketorolac (TORADOL) 15 MG/ML injection 15 mg (has no administration in time range)  acetaminophen (TYLENOL) tablet 1,000 mg (has no administration in time range)    Clinical Impression:  1. Constipation, unspecified constipation type   2. Generalized abdominal pain      Data Unavailable   Final Clinical Impression(s) / ED Diagnoses Final diagnoses:  Constipation, unspecified constipation type  Generalized abdominal pain    Rx / DC Orders ED Discharge Orders          Ordered    polyethylene glycol (MIRALAX) 17 g packet  2 times daily        03/26/22 1654              Tretha Sciara, MD 03/26/22 1745

## 2022-03-26 NOTE — ED Triage Notes (Addendum)
Pt arrived POV from home c/o right lower abdominal pain that wraps around to her flank and travels up her right side to her shoulder. Pt states this has been on going for 3 months but the last 3 days it has gotten worse. Pt also states she suffers from constipation and is having pain when she goes to the bathroom. Pt endorses nausea.

## 2022-03-26 NOTE — Discharge Instructions (Addendum)
Please use the constipation medication with  32 oz of fluid each and follow with your PCP for ongoing care and management.

## 2022-03-26 NOTE — ED Provider Triage Note (Signed)
Emergency Medicine Provider Triage Evaluation Note  Crystal [name] , a 51 y.o. female  was evaluated in triage.  Pt complains of pain for the last 3 months.  She states for the last 3 days has been worse.  Reports history of constipation, abdominal pain is worse after eating spicy foods, and can radiate to her right shoulder.  Also states that she has relief of the abdominal pain after having a bowel movement.  Last bowel movement movement was today but it was hard.  Endorses some nausea no vomiting.  History of cervical cancer.  Review of Systems  Positive: Abdominal pain Negative: Chest pain, SOB  Physical Exam  LMP 07/01/2015  Gen:   Awake, no distress   Resp:  Normal effort  MSK:   Moves extremities without difficulty  Other:  Diffuse abdominal pain, RUQ greatest  Medical Decision Making  Medically screening exam initiated at 1:54 PM.  Appropriate orders placed.  Crystal [name] was informed that the remainder of the evaluation will be completed by another provider, this initial triage assessment does not replace that evaluation, and the importance of remaining in the ED until their evaluation is complete.   Osvaldo Shipper, [place] 03/26/22 1406

## 2022-04-05 ENCOUNTER — Ambulatory Visit: Payer: No Typology Code available for payment source

## 2022-04-19 ENCOUNTER — Other Ambulatory Visit: Payer: Self-pay | Admitting: Hematology and Oncology

## 2022-04-19 DIAGNOSIS — Z124 Encounter for screening for malignant neoplasm of cervix: Secondary | ICD-10-CM

## 2022-04-19 NOTE — Progress Notes (Signed)
Patient: Crystal [name]           Date of Birth: 1970/05/27           MRN: [id number] Visit Date: 04/19/2022 PCP: Charlott Rakes, MD     Cervical Exam  Abnormal Observations: Normal exam. Cervix is surgically absent. 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

## 2022-04-26 LAB — CYTOLOGY - PAP
Adequacy: ABNORMAL
Comment: NEGATIVE

## 2022-04-27 ENCOUNTER — Encounter: Payer: Self-pay | Admitting: Family Medicine

## 2022-04-27 ENCOUNTER — Ambulatory Visit (INDEPENDENT_AMBULATORY_CARE_PROVIDER_SITE_OTHER): Payer: Self-pay | Admitting: Family Medicine

## 2022-04-27 ENCOUNTER — Other Ambulatory Visit: Payer: Self-pay

## 2022-04-27 VITALS — BP 129/67 | HR 82 | Wt 109.7 lb

## 2022-04-27 DIAGNOSIS — M545 Low back pain, unspecified: Secondary | ICD-10-CM | POA: Insufficient documentation

## 2022-04-27 DIAGNOSIS — G8929 Other chronic pain: Secondary | ICD-10-CM

## 2022-04-27 DIAGNOSIS — R1084 Generalized abdominal pain: Secondary | ICD-10-CM

## 2022-04-27 MED ORDER — CYCLOBENZAPRINE HCL 5 MG PO TABS
5.0000 mg | ORAL_TABLET | Freq: Three times a day (TID) | ORAL | 0 refills | Status: DC | PRN
  Filled 2022-04-27: qty 30, 10d supply, fill #0

## 2022-04-27 NOTE — Progress Notes (Signed)
GYNECOLOGY OFFICE VISIT NOTE  History:   Crystal [name] is a 51 y.o. [phone] here today for "R ovarian pain".  Patient hx notable for cervical cancer diagnosed in 2016 and s/p radical hysterectomy/bilateral salpingectomy with pathology negative for remaining dysplasia Pathology reviewed in detail and patient DID NOT have oopherectomy performed, and patient reports this is also what Dr. Denman George told her Hx of nephrolitiasis Also has had some breast pathology, following w regular mammograms  Seen in ED on 03/26/2022 for abdominal pain Diagnosed with constipation Had RUQ [place] that was unremarkable CBC and CMP unremarkable Barely elevated lipase  Has also seen GI in the past on 02/20/2021, questionable diagnosis of idiopathic chronic pancreatitis as well as constipation Prompted by ED visit on 01/31/21 in which haziness was seen around the pancreas Also seen was nonobstructing renal calculi bilaterally measuring up to 4 mm  Today reports has a strange back pain that starts on one side of her back and moves to the front It comes and goes, sometimes lasts a few hours and sometimes a few days Reports it has been present since before her cancer diagnosis Can not think of any factors that worsen the pain Has tried naproxen/ibuprofen which helps a little bit Sometimes vicks vaprub helps as well  Loses sleep because of the pain No blood in urine or stool Denies any pelvic pain, says it is definitely in her back and abdomen Works as a Electrical engineer, very physically strenuous  Biggest fear is that this is a reoccurrence of her cancer   Health Maintenance Due  Topic Date Due   COVID-19 Vaccine (1) Never done   Hepatitis C Screening  Never done   Zoster Vaccines- Shingrix (1 of 2) Never done   INFLUENZA VACCINE  12/22/2021    Past Medical History:  Diagnosis Date   Allergy    Cancer ([place])    cervical cancer dx.    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   not cancer per pt   PONV (postoperative nausea and vomiting)     Past Surgical History:  Procedure Laterality Date   ABDOMINAL HYSTERECTOMY     BREAST BIOPSY Left 08/28/2015   MRI- High Risk   BREAST BIOPSY Right 08/28/2015   MRI- Benign   BREAST EXCISIONAL BIOPSY Left 10/03/2015   BREAST LUMPECTOMY WITH RADIOACTIVE SEED LOCALIZATION Left 10/03/2015   Procedure: LEFT BREAST LUMPECTOMY WITH RADIOACTIVE SEED LOCALIZATION;  Surgeon: Fanny Skates, MD;  Location: [facility];  Service: General;  Laterality: Left;   CERVICAL BIOPSY  W/ LOOP ELECTRODE EXCISION     12'16   CESAREAN SECTION     2 previous   ROBOTIC ASSISTED TOTAL HYSTERECTOMY Bilateral 07/01/2015   Procedure: XI ROBOTIC ASSISTED RADICAL TYPE II HYSTERECTOMY, BILATERAL SALPINGECTOMY, SENTINAL LYMPH NODE BIOPSY;  Surgeon: Everitt Amber, MD;  Location: [facility];  Service: Gynecology;  Laterality: Bilateral;    The following portions of the patient's history were reviewed and updated as appropriate: allergies, current medications, past family history, past medical history, past social history, past surgical history and problem list.   Health Maintenance:   Last pap: Lab Results  Component Value Date   DIAGPAP - Non-diagnostic (A) 04/19/2022   HPV NOT DETECTED 11/08/2018   [place] Other 04/19/2022   Per note cervix is surgically absent  Last mammogram:  BIRADS 1 on 03/02/2022    Review of Systems:  Pertinent items noted in HPI and remainder of comprehensive ROS otherwise negative.  Physical Exam:  BP 129/67   Pulse 82   Wt 109 lb 11.2 oz (49.8 kg)   LMP 07/01/2015   BMI 22.16 kg/m  CONSTITUTIONAL: Well-developed, well-nourished female in no acute distress.  HEENT:  Normocephalic, atraumatic. External right and left ear normal. No scleral icterus.  NECK: Normal range of motion, supple, no masses noted on  observation SKIN: No rash noted. Not diaphoretic. No erythema. No pallor. MUSCULOSKELETAL: mild tenderness to palpation of bilateral lumbar and thoracic paraspinal musculature NEUROLOGIC: Alert and oriented to person, place, and time. Normal muscle tone coordination.  PSYCHIATRIC: Normal mood and affect. Normal behavior. Normal judgment and thought content. RESPIRATORY: Effort normal, no problems with respiration noted ABDOMEN: No masses noted. No other overt distention noted.  Very mild diffuse ttp PELVIC: Deferred  Labs and Imaging No results found for this or any previous visit (from the past 168 hour(s)). No results found.    Assessment and Plan:   Problem List Items Addressed This Visit       Other   Chronic bilateral low back pain without sciatica - Primary    Long discussion with patient. After clarifying with patient that cancer was very low on my differential patient was extremely relieved. 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  With regards to risk of cancer she is in a good place and we reviewed this in detail. Her vaginal cuff paps have come back consistently normal and without any HPV. She had a colonoscopy in 2019 and is not due for another until 2026 per GI note. She had BIRADS 1 mammogram two months ago. Finally her path from her hysterectomy was reviewed in detail and confirms she had no further evidence of malignancy in the corpus or lymph nodes. She does still have her ovaries but we reviewed that s/p salpingectomy she is at lower risk for ovarian cancer. She was relieved to hear all of this. She did have  a CT A/P that was unremarkable in 2022 but given her anxiety I think it reasonable to obtain a pelvic US, she is in agreement with this plan. I will communicate results by telephone, follow up pending results.       Relevant Medications   cyclobenzaprine (FLEXERIL) 5 MG tablet   Other Visit Diagnoses     Generalized abdominal pain       Relevant Orders   US PELVIC COMPLETE WITH TRANSVAGINAL       Routine preventative health maintenance measures emphasized. Please refer to After Visit Summary for other counseling recommendations.   Return pending [place] results.    Total face-to-face time with patient: 25 minutes.  Over 50% of encounter was spent on counseling and coordination of care.   Clarnce Flock, MD/MPH Attending Family Medicine Physician, [facility] for [facility], [place]

## 2022-04-27 NOTE — Assessment & Plan Note (Addendum)
Long discussion with patient. After clarifying with patient that cancer was very low on my differential patient was extremely relieved. 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  With regards to risk of cancer she is in a good place and we reviewed this in detail. Her vaginal cuff paps have come back consistently normal and without any HPV. She had a colonoscopy in 2019 and is not due for another until 2026 per GI note. She had BIRADS 1 mammogram two months ago. Finally her path from her hysterectomy was reviewed in detail and confirms she had no further evidence of malignancy in the corpus or lymph nodes. She does still have her ovaries but we reviewed that s/p salpingectomy she is at lower risk for ovarian cancer. She was relieved to hear all of this. She did have a CT A/P that was unremarkable in 2022 but given her anxiety I think it reasonable to obtain a pelvic US, she is in agreement with this plan. I will communicate results by telephone, follow up pending results.

## 2022-04-28 NOTE — Telephone Encounter (Signed)
-----   Message from Melodye Ped, NP sent at 04/26/2022 10:36 AM EST ----- Can we get this patient in for repeat for pap smear as her results were unsatisfactory? Thanks!  Melissa ----- Message ----- From: Interface, Lab In Three Zero One Sent: 04/26/2022   9:51 AM EST To: Melodye Ped, NP

## 2022-04-28 NOTE — Telephone Encounter (Signed)
Telephone call made to the pt with Interpreter, Rudene Anda. Pt has been advised of her "unsatisfactory" PAP results and will need to repeat it. Pt has been rescheduled for 05/03/22 at 9:00am. All questions were answered and no further assistance was needed at this time.

## 2022-05-03 ENCOUNTER — Other Ambulatory Visit: Payer: Self-pay | Admitting: Hematology and Oncology

## 2022-05-03 ENCOUNTER — Other Ambulatory Visit: Payer: Self-pay

## 2022-05-03 DIAGNOSIS — Z124 Encounter for screening for malignant neoplasm of cervix: Secondary | ICD-10-CM

## 2022-05-03 NOTE — Addendum Note (Signed)
Addended by: Claretha Cooper on: 05/03/2022 09:17 AM   Modules accepted: Orders

## 2022-05-03 NOTE — Addendum Note (Signed)
Addended by: Claretha Cooper on: 05/03/2022 09:15 AM   Modules accepted: Orders

## 2022-05-03 NOTE — Progress Notes (Signed)
Patient: Crystal [name]           Date of Birth: Sep 21, 1970           MRN: [id number] Visit Date: 05/03/2022 PCP: Charlott Rakes, MD  Cervical Cancer Screening Do you smoke?: No Have you ever had or been told you have an allergy to latex products?: No Marital status: Single Date of last pap smear: Within the last year Number of pregnancies: 2 Number of births: 2 Have you ever had any of the following? Hysterectomy: Yes Tubal ligation (tubes tied): Yes Abnormal bleeding: No Abnormal pap smear: Yes Venereal warts: No A sex partner with venereal warts: No A high risk* sex partner: No  Cervical Exam  Abnormal Observations: Normal exam  Recommendations: Repeat Pap smear in 5 years if normal and Negative HPV.      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

## 2022-05-04 ENCOUNTER — Ambulatory Visit (HOSPITAL_COMMUNITY)
Admission: RE | Admit: 2022-05-04 | Discharge: 2022-05-04 | Disposition: A | Discharge status: Home / Self Care | Payer: No Typology Code available for payment source | Source: Ambulatory Visit | Attending: Family Medicine | Admitting: Family Medicine

## 2022-05-04 DIAGNOSIS — R1084 Generalized abdominal pain: Secondary | ICD-10-CM | POA: Insufficient documentation

## 2022-05-05 NOTE — Progress Notes (Signed)
Called patient to discuss [place] results, confirmed identity with two markers. No abnormal findings, provided reassurance. Wondering if back pain could be from her kidneys, discussed this is unlikely and also she had normal Cr check last month and for many years prior. All questions answered, follow up PRN.

## 2022-05-06 ENCOUNTER — Ambulatory Visit: Payer: Self-pay | Attending: Family Medicine

## 2022-05-06 ENCOUNTER — Telehealth: Payer: Self-pay | Admitting: Family Medicine

## 2022-05-06 NOTE — Telephone Encounter (Signed)
Pt missing bank stmts from Sept-Oct and Nov-Dec in order to successfully submit [employer] app. Also, pt already has current orange card doesn't exp until 07/28/2022.

## 2022-05-07 ENCOUNTER — Other Ambulatory Visit: Payer: Self-pay

## 2022-05-12 LAB — CYTOLOGY - PAP
Adequacy: ABNORMAL
Comment: NEGATIVE

## 2022-05-26 ENCOUNTER — Encounter: Payer: Self-pay | Admitting: Physician Assistant

## 2022-06-02 ENCOUNTER — Other Ambulatory Visit: Payer: Self-pay | Admitting: Hematology and Oncology

## 2022-06-02 NOTE — Progress Notes (Signed)
Spoke with patient with interpreter Rudene Anda. Discussed continued non-diagnostic results from Pap smear. We will bring her in for colposcopy for further evaluation. She will be seen Feb. 13 at 8:30 am. She verbalizes understanding.

## 2022-06-28 ENCOUNTER — Encounter: Payer: Self-pay | Admitting: Family Medicine

## 2022-06-28 ENCOUNTER — Ambulatory Visit: Payer: Self-pay | Attending: Family Medicine | Admitting: Family Medicine

## 2022-06-28 ENCOUNTER — Other Ambulatory Visit: Payer: Self-pay

## 2022-06-28 VITALS — BP 122/71 | HR 79 | Temp 98.6°F | Ht 59.0 in | Wt 112.6 lb

## 2022-06-28 DIAGNOSIS — G4700 Insomnia, unspecified: Secondary | ICD-10-CM

## 2022-06-28 DIAGNOSIS — H1010 Acute atopic conjunctivitis, unspecified eye: Secondary | ICD-10-CM

## 2022-06-28 DIAGNOSIS — Z9189 Other specified personal risk factors, not elsewhere classified: Secondary | ICD-10-CM

## 2022-06-28 DIAGNOSIS — B079 Viral wart, unspecified: Secondary | ICD-10-CM

## 2022-06-28 MED ORDER — TRAZODONE HCL 50 MG PO TABS
50.0000 mg | ORAL_TABLET | Freq: Every evening | ORAL | 1 refills | Status: DC | PRN
  Filled 2022-06-28: qty 90, 90d supply, fill #0

## 2022-06-28 MED ORDER — CETIRIZINE HCL 10 MG PO TABS
10.0000 mg | ORAL_TABLET | Freq: Every day | ORAL | 1 refills | Status: DC
  Filled 2022-06-28: qty 30, 30d supply, fill #0

## 2022-06-28 MED ORDER — OLOPATADINE HCL 0.1 % OP SOLN
1.0000 [drp] | Freq: Two times a day (BID) | OPHTHALMIC | 1 refills | Status: DC
  Filled 2022-06-28: qty 5, 50d supply, fill #0

## 2022-06-28 NOTE — Patient Instructions (Signed)
Insomnio Insomnia El insomnio es un trastorno del sueo que causa dificultades para conciliar el sueo o para mantenerlo. Puede producir fatiga, falta de energa, dificultad para concentrarse, cambios en el estado de nimo y mal rendimiento escolar o laboral. Hay tres formas diferentes de clasificar el insomnio: Dificultad para conciliar el sueo. Dificultad para mantener el sueo. Despertar muy precoz por la maana. Cualquier tipo de insomnio puede ser a largo plazo (crnico) o a corto plazo (agudo). Ambos son frecuentes. Generalmente, el insomnio a corto plazo dura 3 meses o menos tiempo. El insomnio crnico ocurre al menos tres veces por semana durante ms de 3 meses. Cules son las causas? El insomnio puede deberse a otra afeccin, situacin o sustancia, por ejemplo: Tener ciertas afecciones de salud mental, como ansiedad y depresin. Consumir cafena, alcohol, tabaco o drogas. Tener afecciones gastrointestinales, como enfermedad de reflujo gastroesofgico (ERGE). Tener ciertas afecciones. Estas incluyen las siguientes: Asma. Enfermedad de Alzheimer. Accidente cerebrovascular. Dolor crnico. Hiperactividad de la glndula tiroidea (hipertiroidismo). Otros trastornos del sueo, como sndrome de las piernas inquietas y apnea del sueo. Menopausia. En ocasiones, la causa del insomnio puede ser desconocida. Qu incrementa el riesgo? Los factores de riesgo de tener insomnio incluyen lo siguiente: Sexo. Esta afeccin es ms frecuente en las mujeres que en los hombres. Edad. El insomnio es ms frecuente a medida que las personas envejecen. El estrs y ciertas afecciones de salud mental y mdicas. Falta de actividad fsica. Tener un horario de trabajo irregular. Esto puede incluir trabajar turnos nocturnos y viajar entre diferentes zonas horarias. Cules son los signos o sntomas? Si tiene insomnio, el sntoma principal es la dificultad para conciliar el sueo o mantenerlo. Esto puede  derivar en otros sntomas, por ejemplo: Sentirse cansado o tener poca energa. Ponerse nervioso por tener que irse a dormir. No sentirse descansado por la maana. Tener dificultad para concentrarse. Sentirse irritable, ansioso o deprimido. Cmo se diagnostica? Esta afeccin se puede diagnosticar en funcin de lo siguiente: Los sntomas y antecedentes mdicos. El mdico puede hacerle preguntas sobre: Hbitos de sueo. Cualquier afeccin mdica que tenga. La salud mental. Un examen fsico. Cmo se trata? El tratamiento para el insomnio depende de la causa. El tratamiento puede centrarse en tratar una afeccin subyacente que causa el insomnio. El tratamiento tambin puede incluir lo siguiente: Medicamentos que lo ayuden a dormir. Asesoramiento psicolgico o terapia. Ajustes en el estilo de vida para ayudarlo a dormir mejor. Siga estas indicaciones en su casa: Comida y bebida  Limite o evite el consumo de alcohol, bebidas con cafena y productos que contengan nicotina y tabaco, especialmente cerca de la hora de acostarse. Estos productos pueden perturbarle el sueo. No consuma una comida suculenta ni coma alimentos condimentados justo antes de la hora de acostarse. Esto puede causarle molestias digestivas y dificultades para dormir. Hbitos de sueo  Lleve un registro del sueo ya que podra ser de utilidad para que usted y a su mdico puedan determinar qu podra estar causndole insomnio. Escriba los siguientes datos: Cundo duerme. Cundo se despierta durante la noche. Qu tan bien duerme y si se siente descansado al da siguiente. Cualquier efecto secundario de los medicamentos que toma. Lo que usted come y bebe. Convierta su habitacin en un lugar oscuro, cmodo donde sea fcil conciliar el sueo. Coloque persianas o cortinas oscuras que impidan la entrada de la luz del exterior. Para bloquear los ruidos, use un aparato que reproduzca sonidos ambientales o relajantes de  fondo. Mantenga baja la temperatura. Limite el uso de   pantallas antes de la hora de acostarse. Esto incluye: No ver televisin. No usar el telfono inteligente, la tableta o la computadora. Siga una rutina que incluya ir a dormir y despertarse a la misma hora cada da y noche. Esto puede ayudarlo a conciliar el sueo ms rpidamente. Considere realizar una actividad tranquila, como leer, e incorporarla como parte de la rutina a la hora de irse a dormir. Trate de evitar tomar siestas durante el da para que pueda dormir mejor por la noche. Levntese de la cama si sigue despierto despus de 15 minutos de haber intentado dormirse. Mantenga bajas las luces, pero intente leer o hacer una actividad tranquila. Cuando tenga sueo, regrese a la cama. Indicaciones generales Use los medicamentos de venta libre y los recetados solamente como se lo haya indicado el mdico. Haga actividad fsica habitualmente como se lo haya indicado el mdico. Sin embargo, evite hacer actividad fsica las horas antes de acostarse. Utilice tcnicas de relajacin para controlar el estrs. Pdale al mdico que le sugiera algunas tcnicas que sean adecuadas para usted. Pueden incluir: Ejercicios de respiracin. Rutinas para aliviar la tensin muscular. Visualizacin de escenas apacibles. Conduzca con cuidado. No conduzca si se siente muy somnoliento. Concurra a todas las visitas de seguimiento. Esto es importante. Comunquese con un mdico si: Est cansado durante todo el da. Tiene dificultad en su rutina diaria debido a la somnolencia. Sigue teniendo problemas para dormir o estos empeoran. Solicite ayuda de inmediato si: Piensa en lastimarse a usted mismo o a otra persona. Busque ayuda de inmediato si alguna vez siente que puede hacerse dao a usted mismo o a otros, o tiene pensamientos de poner fin a su vida. Dirjase al centro de urgencias ms cercano o: Llame al 911. Llame a National Suicide Prevention Lifeline (Lnea  Telefnica Nacional para la Prevencin del Suicidio) al 1-800-273-8255 o al 988. Est disponible las 24 horas del da. Enve un mensaje de texto a la lnea para casos de crisis al [id number]. Resumen El insomnio es un trastorno del sueo que causa dificultades para conciliar el sueo o para mantenerlo. El insomnio puede ser a largo plazo (crnico) o a corto plazo (agudo). El tratamiento para el insomnio depende de la causa. El tratamiento puede centrarse en tratar una afeccin subyacente que causa el insomnio. Lleve un registro del sueo ya que podra ser de utilidad para que usted y a su mdico puedan determinar qu podra estar causndole insomnio. Esta informacin no tiene como fin reemplazar el consejo del mdico. Asegrese de hacerle al mdico cualquier pregunta que tenga. Document Revised: 05/21/2021 Document Reviewed: 05/21/2021 Elsevier Patient Education  2023 Elsevier Inc.  

## 2022-06-28 NOTE — Progress Notes (Signed)
Not sleeping well Mole under left eye Eyes are itchy and burning in the Am.

## 2022-06-28 NOTE — Progress Notes (Signed)
Subjective:  Patient ID: Crystal [name], female    DOB: 1970-11-02  Age: 52 y.o. MRN: [id number]  CC: Insomnia   HPI Crystal [name] is a 52 y.o. year old female with a history of GERD, Stage 1B cervical cancer status post robotic-assisted type III radical laparoscopic hysterectomy with bilateral salpingectomy and bilateral sentinel lymph node biopsy in 06/2015 (no residual carcinoma as per GYN )   Interval History:  She has had insomnia and denies intake of caffeinated products, does not take daytime naps. She has difficulty staying asleep as she sleeps for 2 hours then wakes up and is unable to go back to sleep until 1 hour to the time when she has wake up to prepare for work.  Her hours at work up from 8 AM to 2 PM.  Also Complains of itching and burning in eyes and they sometimes turn red especially in the mornings. She denies presence of sinus symptoms.  She has a mole under her left eye which is sometimes itchy and when she scratches it, it burns. She noticed this 2 years ago.  She was referred to a dermatologist but was subsequently informed she needed to apply for the [facility] health financial discount which she has done. Request referral to the dentist for dental cleaning. Past Medical History:  Diagnosis Date   Allergy    Cancer ([place])    cervical cancer dx.    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   not cancer per pt   PONV (postoperative nausea and vomiting)     Past Surgical History:  Procedure Laterality Date   ABDOMINAL HYSTERECTOMY     BREAST BIOPSY Left 08/28/2015   MRI- High Risk   BREAST BIOPSY Right 08/28/2015   MRI- Benign   BREAST EXCISIONAL BIOPSY Left 10/03/2015   BREAST LUMPECTOMY WITH RADIOACTIVE SEED LOCALIZATION Left 10/03/2015   Procedure: LEFT BREAST LUMPECTOMY WITH RADIOACTIVE SEED LOCALIZATION;  Surgeon: Fanny Skates, MD;  Location: [facility];  Service: General;  Laterality: Left;   CERVICAL BIOPSY  W/ LOOP ELECTRODE EXCISION     12'16   CESAREAN SECTION     2 previous   ROBOTIC ASSISTED TOTAL HYSTERECTOMY Bilateral 07/01/2015   Procedure: XI ROBOTIC ASSISTED RADICAL TYPE II HYSTERECTOMY, BILATERAL SALPINGECTOMY, SENTINAL LYMPH NODE BIOPSY;  Surgeon: Everitt Amber, MD;  Location: [facility];  Service: Gynecology;  Laterality: Bilateral;    Family History  Problem Relation Age of Onset   Hypertension Sister    Atrial fibrillation Sister    Colon cancer Neg Hx    Esophageal cancer Neg Hx    Rectal cancer Neg Hx    Stomach cancer Neg Hx     Social History   Socioeconomic History   Marital status: Widowed    Spouse name: Not on file   Number of children: 2   Years of education: Not on file   Highest education level: 9th grade  Occupational History   Occupation: cleans homes  Tobacco Use   Smoking status: Never   Smokeless tobacco: Never  Vaping Use   Vaping Use: Never used  Substance and Sexual Activity   Alcohol use: Yes    Comment: occasionally   Drug use: No   Sexual activity: Not Currently    Birth control/protection: Surgical  Other Topics Concern  Not on file  Social History Narrative   Not on file   Social Determinants of Health   Financial Resource Strain: Not on file  Food Insecurity: No Food Insecurity (03/02/2022)   Hunger Vital Sign    Worried About Running Out of Food in the Last Year: Never true    Ran Out of Food in the Last Year: Never true  Transportation Needs: No Transportation Needs (03/02/2022)   PRAPARE - Hydrologist (Medical): No    Lack of Transportation (Non-Medical): No  Physical Activity: Not on file  Stress: Not on file  Social Connections: Not on file    Allergies  Allergen Reactions   Contrast Media [Iodinated Contrast Media] Itching    13 hr prep in the future; itching of tongue and around the  mouth 03/02/2019    Outpatient Medications Prior to Visit  Medication Sig Dispense Refill   cyclobenzaprine (FLEXERIL) 5 MG tablet Take 1 tablet (5 mg total) by mouth 3 (three) times daily as needed for muscle spasms. (Patient not taking: Reported on 06/28/2022) 30 tablet 0   naproxen (NAPROSYN) 500 MG tablet Take 1 tablet (500 mg total) by mouth 2 (two) times daily with a meal. (Patient not taking: Reported on 06/28/2022) 60 tablet 1   polyethylene glycol (MIRALAX) 17 g packet Take 17 g by mouth 2 (two) times daily. (Patient not taking: Reported on 04/27/2022) 14 each 0   valACYclovir (VALTREX) 500 MG tablet Take 1 tablet (500 mg total) by mouth 2 (two) times daily. (Patient not taking: Reported on 06/28/2022) 14 tablet 0   traZODone (DESYREL) 50 MG tablet Take 0.5-1 tablets (25-50 mg total) by mouth at bedtime as needed for sleep. (Patient not taking: Reported on 03/02/2022) 30 tablet 3   No facility-administered medications prior to visit.     ROS Review of Systems  Constitutional:  Negative for activity change and appetite change.  HENT:  Negative for sinus pressure and sore throat.   Eyes:  Positive for itching.  Respiratory:  Negative for chest tightness, shortness of breath and wheezing.   Cardiovascular:  Negative for chest pain and palpitations.  Gastrointestinal:  Negative for abdominal distention, abdominal pain and constipation.  Genitourinary: Negative.   Musculoskeletal: Negative.   Skin:  Positive for rash.  Psychiatric/Behavioral:  Positive for sleep disturbance. Negative for behavioral problems and dysphoric mood.     Objective:  BP 122/71   Pulse 79   Temp 98.6 F (37 C) (Oral)   Ht '4\' 11"'$  (1.499 m)   Wt 112 lb 9.6 oz (51.1 kg)   LMP 07/01/2015   SpO2 99%   BMI 22.74 kg/m      06/28/2022    4:12 PM 04/27/2022    9:12 AM 03/26/2022    5:30 PM  BP/Weight  Systolic BP 756 433 295  Diastolic BP 71 67 80  Wt. (Lbs) 112.6 109.7   BMI 22.74 kg/m2 22.16 kg/m2        Physical Exam Constitutional:      Appearance: She is well-developed.  Eyes:     Conjunctiva/sclera: Conjunctivae normal.     Comments: Bilateral infraorbital edema  Cardiovascular:     Rate and Rhythm: Normal rate.     Heart sounds: Normal heart sounds. No murmur heard. Pulmonary:     Effort: Pulmonary effort is normal.     Breath sounds: Normal breath sounds. No wheezing or rales.  Chest:     Chest wall: No tenderness.  Abdominal:     General: Bowel sounds are normal. There is no distension.     Palpations: Abdomen is soft. There is no mass.     Tenderness: There is no abdominal tenderness.  Musculoskeletal:        General: Normal range of motion.     Right lower leg: No edema.     Left lower leg: No edema.  Skin:    Comments: Papule on left cheek with rough surface, hyperpigmented  Neurological:     Mental Status: She is alert and oriented to person, place, and time.  Psychiatric:        Mood and Affect: Mood normal.        Latest Ref Rng & Units 03/26/2022    2:35 PM 12/23/2021    4:27 PM 01/30/2021    6:11 PM  CMP  Glucose 70 - 99 mg/dL 99  91  97   BUN 6 - 20 mg/dL '14  18  14   '$ Creatinine 0.44 - 1.00 mg/dL 0.73  0.88  0.78   Sodium 135 - 145 mmol/L 137  138  137   Potassium 3.5 - 5.1 mmol/L 4.0  4.0  3.8   Chloride 98 - 111 mmol/L 107  100  105   CO2 22 - 32 mmol/L '19  22  23   '$ Calcium 8.9 - 10.3 mg/dL 9.8  9.6  9.3   Total Protein 6.5 - 8.1 g/dL 7.6  7.8  7.2   Total Bilirubin 0.3 - 1.2 mg/dL 1.0  0.4  0.9   Alkaline Phos 38 - 126 U/L 82  105  83   AST 15 - 41 U/L '26  21  19   '$ ALT 0 - 44 U/L 22  36  26     Lipid Panel     Component Value Date/Time   CHOL 181 11/03/2015 1017   TRIG 97 11/03/2015 1017   HDL 57 11/03/2015 1017   CHOLHDL 3.2 11/03/2015 1017   VLDL 19 11/03/2015 1017   LDLCALC 105 11/03/2015 1017    CBC    Component Value Date/Time   WBC 8.1 03/26/2022 1435   RBC 4.64 03/26/2022 1435   HGB 15.0 03/26/2022 1435   HGB 15.5  08/08/2019 1702   HGB 15.0 07/08/2015 1441   HCT 44.0 03/26/2022 1435   HCT 44.6 08/08/2019 1702   HCT 45.5 07/08/2015 1441   PLT 261 03/26/2022 1435   PLT 257 08/08/2019 1702   MCV 94.8 03/26/2022 1435   MCV 96 08/08/2019 1702   MCV 96.1 07/08/2015 1441   MCH 32.3 03/26/2022 1435   MCHC 34.1 03/26/2022 1435   RDW 12.0 03/26/2022 1435   RDW 11.9 08/08/2019 1702   RDW 12.4 07/08/2015 1441   LYMPHSABS 2.3 03/26/2022 1435   LYMPHSABS 2.0 08/08/2019 1702   LYMPHSABS 1.1 07/08/2015 1441   MONOABS 0.6 03/26/2022 1435   MONOABS 0.6 07/08/2015 1441   EOSABS 0.1 03/26/2022 1435   EOSABS 0.2 08/08/2019 1702   BASOSABS 0.0 03/26/2022 1435   BASOSABS 0.0 08/08/2019 1702   BASOSABS 0.1 07/08/2015 1441    Lab Results  Component Value Date   HGBA1C 5.5 04/04/2015    Assessment & Plan:  1. Insomnia, unspecified type Discussed sleep hygiene She endorses walking about 30 minutes a day during the week Will place trazodone - traZODone (DESYREL) 50 MG tablet; Take 1 tablet (50 mg total) by mouth at bedtime as needed for sleep.  Dispense: 90 tablet; Refill: 1  2. Wart of face - Ambulatory referral to Dermatology  3. Need for dental care - Ambulatory referral to Dentistry  4. Seasonal allergic conjunctivitis Could explain her eye symptoms. - cetirizine (ZYRTEC) 10 MG tablet; Take 1 tablet (10 mg total) by mouth daily.  Dispense: 30 tablet; Refill: 1 - olopatadine (PATANOL) 0.1 % ophthalmic solution; Place 1 drop into both eyes 2 (two) times daily.  Dispense: 5 mL; Refill: 1    Meds ordered this encounter  Medications   traZODone (DESYREL) 50 MG tablet    Sig: Take 1 tablet (50 mg total) by mouth at bedtime as needed for sleep.    Dispense:  90 tablet    Refill:  1   cetirizine (ZYRTEC) 10 MG tablet    Sig: Take 1 tablet (10 mg total) by mouth daily.    Dispense:  30 tablet    Refill:  1   olopatadine (PATANOL) 0.1 % ophthalmic solution    Sig: Place 1 drop into both eyes 2  (two) times daily.    Dispense:  5 mL    Refill:  1    Follow-up: Return in about 6 months (around 12/27/2022) for Chronic medical conditions.       Charlott Rakes, MD, FAAFP. [facility], [place]   06/28/2022, 4:57 PM

## 2022-07-06 ENCOUNTER — Ambulatory Visit: Payer: Self-pay

## 2022-08-03 ENCOUNTER — Ambulatory Visit: Payer: Self-pay | Admitting: Hematology and Oncology

## 2022-08-03 VITALS — BP 110/67 | Wt 110.0 lb

## 2022-08-03 DIAGNOSIS — R87615 Unsatisfactory cytologic smear of cervix: Secondary | ICD-10-CM

## 2022-08-03 NOTE — Progress Notes (Signed)
[facility] COLPOSCOPY PROCEDURE NOTE  [name] is a 52 y.o. [id number] here for colposcopy for  history of CIN III with invasive cervical adenocarcinoma  pap smear on 04/2022. Last 3 Pap smears have been nondiagnostic. Discussed role for HPV in cervical dysplasia, need for surveillance. She had hysterectomy in 2017.  Patient given informed consent, signed copy in the chart, time out was performed.  Placed in lithotomy position. Cervix viewed with speculum and colposcope after application of acetic acid.   Colposcopy adequate? Yes  no visible lesions. Atrophy noted on exam.   Patient was given post procedure instructions. Routine preventative health maintenance measures emphasized. Will discuss with Dr. Elly Modena future screening and advise patient.    Dayton Scrape A, NP 08/03/2022 1:10 PM

## 2022-08-05 ENCOUNTER — Telehealth: Payer: Self-pay | Admitting: Emergency Medicine

## 2022-08-05 ENCOUNTER — Ambulatory Visit: Payer: Self-pay

## 2022-08-05 ENCOUNTER — Other Ambulatory Visit: Payer: Self-pay | Admitting: Hematology and Oncology

## 2022-08-05 NOTE — Telephone Encounter (Signed)
Copied from [place] [id number]. Topic: Appointment Scheduling - Scheduling Inquiry for Clinic >> Aug 05, 2022 12:40 PM Erskine Squibb wrote: Reason for CRM: The patient is unable to make her appt today with the financial counselor and needs someone to call her back to reschedule with an interpreter. Please assist patient further

## 2022-08-13 ENCOUNTER — Ambulatory Visit: Payer: Self-pay

## 2022-08-13 ENCOUNTER — Other Ambulatory Visit: Payer: Self-pay

## 2022-08-13 MED ORDER — DICLOFENAC SODIUM 1 % EX GEL
2.0000 g | Freq: Four times a day (QID) | CUTANEOUS | 0 refills | Status: DC
  Filled 2022-08-13: qty 200, 25d supply, fill #0

## 2022-08-13 MED ORDER — NITROFURANTOIN MONOHYD MACRO 100 MG PO CAPS
100.0000 mg | ORAL_CAPSULE | Freq: Two times a day (BID) | ORAL | 0 refills | Status: DC
  Filled 2022-08-13: qty 10, 5d supply, fill #0

## 2022-08-13 MED ORDER — ACETAMINOPHEN ER 650 MG PO TBCR: EXTENDED_RELEASE_TABLET | ORAL | 0 refills | Status: DC

## 2022-08-13 MED ORDER — PHENAZOPYRIDINE HCL 200 MG PO TABS
200.0000 mg | ORAL_TABLET | Freq: Three times a day (TID) | ORAL | 0 refills | Status: DC
  Filled 2022-08-13: qty 6, 2d supply, fill #0

## 2022-08-13 MED ORDER — IBUPROFEN 800 MG PO TABS
800.0000 mg | ORAL_TABLET | Freq: Three times a day (TID) | ORAL | 0 refills | Status: DC
  Filled 2022-08-13: qty 30, 10d supply, fill #0

## 2022-08-13 MED ORDER — CYCLOBENZAPRINE HCL 5 MG PO TABS
5.0000 mg | ORAL_TABLET | Freq: Every day | ORAL | 0 refills | Status: DC
  Filled 2022-08-13: qty 5, 5d supply, fill #0

## 2022-08-13 NOTE — Telephone Encounter (Signed)
  Chief Complaint: Vaginal itching, burning with urination back pain Symptoms: above Frequency: Back pain 2 months, itching and burning 1 week Pertinent Negatives: Patient denies fever Disposition: [] ED /[x] Urgent Care (no appt availability in office) / [] Appointment(In office/virtual)/ []  [facility]/ [] Home Care/ [x] Refused Recommended Disposition /[] [facility]/ []  Follow-up with PCP Additional Notes: Pt has had back pain for 8/10 back pain for 2 months. She also has vaginal burning and itching for 1 week. Pt refuses UC and will wait for in office appt on Monday.  PT will monitor s/s and seek care if needed.    Summary: vaginal itching/burning when urinating   Vaginal itching, burning when urinating 1 week, no appointments until monday.     Reason for Disposition  [1] MILD-MODERATE pain AND [2] present > 24 hours  (Exception: Chronic pain.)  Answer Assessment - Initial Assessment Questions 1. SYMPTOM: "What's the main symptom you're concerned about?" (e.g., pain, itching, dryness)     Itching and burning with urination 2. LOCATION: "Where is the  s/s located?" (e.g., inside/outside, left/right)     Entrance to vagina 3. ONSET: "When did the  s/s  start?"     1 week 4. PAIN: "Is there any pain?" If Yes, ask: "How bad is it?" (Scale: 1-10; mild, moderate, severe)   -  MILD (1-3): Doesn't interfere with normal activities.    -  MODERATE (4-7): Interferes with normal activities (e.g., work or school) or awakens from sleep.     -  SEVERE (8-10): Excruciating pain, unable to do any normal activities.     2 months 8/10 5. ITCHING: "Is there any itching?" If Yes, ask: "How bad is it?" (Scale: 1-10; mild, moderate, severe)     3-4/10 6. CAUSE: "What do you think is causing the discharge?" "Have you had the same problem before? What happened then?"     No discharge 7. OTHER SYMPTOMS: "Do you have any other symptoms?" (e.g., fever, itching, vaginal bleeding, pain with  urination, injury to genital area, vaginal foreign body)     itching  Protocols used: Vaginal Symptoms-A-AH

## 2022-08-13 NOTE — Telephone Encounter (Signed)
Noted  

## 2022-08-16 ENCOUNTER — Ambulatory Visit: Payer: Self-pay | Attending: Internal Medicine | Admitting: Internal Medicine

## 2022-08-16 ENCOUNTER — Other Ambulatory Visit: Payer: Self-pay

## 2022-08-16 MED ORDER — AMOXICILLIN 875 MG PO TABS
875.0000 mg | ORAL_TABLET | Freq: Two times a day (BID) | ORAL | 0 refills | Status: DC
  Filled 2022-08-16: qty 14, 7d supply, fill #0

## 2022-09-16 ENCOUNTER — Encounter: Payer: Self-pay | Admitting: Dermatology

## 2022-09-16 ENCOUNTER — Ambulatory Visit (INDEPENDENT_AMBULATORY_CARE_PROVIDER_SITE_OTHER): Payer: Self-pay | Admitting: Dermatology

## 2022-09-16 ENCOUNTER — Other Ambulatory Visit: Payer: Self-pay

## 2022-09-16 DIAGNOSIS — D239 Other benign neoplasm of skin, unspecified: Secondary | ICD-10-CM

## 2022-09-16 DIAGNOSIS — L814 Other melanin hyperpigmentation: Secondary | ICD-10-CM

## 2022-09-16 DIAGNOSIS — B079 Viral wart, unspecified: Secondary | ICD-10-CM

## 2022-09-16 MED ORDER — HYDROQUINONE 4 % EX CREA
TOPICAL_CREAM | Freq: Every evening | CUTANEOUS | 0 refills | Status: DC
  Filled 2022-09-16: qty 28.35, 30d supply, fill #0

## 2022-09-16 NOTE — Patient Instructions (Addendum)
Recommended Sunscreen   Cryotherapy Aftercare  Wash gently with soap and water everyday.   Apply Vaseline and Band-Aid daily until healed.   Due to recent changes in healthcare laws, you may see results of your pathology and/or laboratory studies on MyChart before the doctors have had a chance to review them. We understand that in some cases there may be results that are confusing or concerning to you. Please understand that not all results are received at the same time and often the doctors may need to interpret multiple results in order to provide you with the best plan of care or course of treatment. Therefore, we ask that you please give [place] 2 business days to thoroughly review all your results before contacting the office for clarification. Should we see a critical lab result, you will be contacted sooner.   If You Need Anything After Your Visit  If you have any questions or concerns for your doctor, please call our main line at [phone] If no one answers, please leave a voicemail as directed and we will return your call as soon as possible. Messages left after 4 pm will be answered the following business day.   You may also send [place] a message via MyChart. We typically respond to MyChart messages within 1-2 business days.  For prescription refills, please ask your pharmacy to contact our office. Our fax number is [phone].  If you have an urgent issue when the clinic is closed that cannot wait until the next business day, you can page your doctor at the number below.    Please note that while we do our best to be available for urgent issues outside of office hours, we are not available 24/7.   If you have an urgent issue and are unable to reach [place], you may choose to seek medical care at your doctor's office, retail clinic, urgent care center, or emergency room.  If you have a medical emergency, please immediately call 911 or go to the emergency department. In the event of  inclement weather, please call our main line at [phone] for an update on the status of any delays or closures.  Dermatology Medication Tips: Please keep the boxes that topical medications come in in order to help keep track of the instructions about where and how to use these. Pharmacies typically print the medication instructions only on the boxes and not directly on the medication tubes.   If your medication is too expensive, please contact our office at [phone] or send [place] a message through MyChart.   We are unable to tell what your co-pay for medications will be in advance as this is different depending on your insurance coverage. However, we may be able to find a substitute medication at lower cost or fill out paperwork to get insurance to cover a needed medication.   If a prior authorization is required to get your medication covered by your insurance company, please allow [place] 1-2 business days to complete this process.  Drug prices often vary depending on where the prescription is filled and some pharmacies may offer cheaper prices.  The website www.goodrx.com contains coupons for medications through different pharmacies. The prices here do not account for what the cost may be with help from insurance (it may be cheaper with your insurance), but the website can give you the price if you did not use any insurance.  - You can print the associated coupon and take it with your prescription to the pharmacy.  -  You may also stop by our office during regular business hours and pick up a GoodRx coupon card.  - If you need your prescription sent electronically to a different pharmacy, notify our office through [facility] or by phone at [phone]

## 2022-09-16 NOTE — Progress Notes (Signed)
   New Patient Visit   Subjective  Crystal [name] is a 52 y.o. female who presents for the following: nevi  Accompanied by interpreter.   Nevi on face has been present for about two years. It itches on the outer edge. It gets red and inflamed when scratched or exposed to the sun. Nevi on back and arm itch also.   The following portions of the chart were reviewed this encounter and updated as appropriate: medications, allergies, medical history  Review of Systems:  No other skin or systemic complaints except as noted in HPI or Assessment and Plan.  Objective  Well appearing patient in no apparent distress; mood and affect are within normal limits.  A focused examination was performed of the following areas: Face, arm, and back   Relevant exam findings are noted in the Assessment and Plan.  Left Buccal Cheek, Left Forearm - Posterior Verrucous papules   Head - Anterior (Face) scattered tan macules     Assessment & Plan   DERMATOFIBROMA Exam: Firm pink/brown papulenodule with dimple sign.  Treatment Plan: A dermatofibroma is a benign growth possibly related to trauma, such as an insect bite, cut from shaving, or inflamed acne-type bump.  Treatment options to remove include shave or excision with resulting scar and risk of recurrence.  Since benign-appearing and not bothersome, will observe for now.    Verruca (2) Left Forearm - Posterior; Left Buccal Cheek  Destruction of lesion - Left Buccal Cheek, Left Forearm - Posterior Complexity: simple   Destruction method: cryotherapy   Informed consent: discussed and consent obtained   Timeout:  patient name, date of birth, surgical site, and procedure verified Lesion destroyed using liquid nitrogen: Yes   Region frozen until ice ball extended beyond lesion: Yes   Outcome: patient tolerated procedure well with no complications   Post-procedure details: wound care instructions given    Lentigines Head - Anterior  (Face)  -Apply Hydroquinone cream to the face nightly for three months then stop treatment   hydroquinone 4 % cream - Head - Anterior (Face) Apply topically at bedtime. Apply topically to the face at night for three months then stop treatment    Return if symptoms worsen or fail to improve.    Documentation: I have reviewed the above documentation for accuracy and completeness, and I agree with the above.  Langston Reusing, MD   I, Germaine Pomfret, CMA, am acting as scribe for Langston Reusing, MD.

## 2022-09-21 ENCOUNTER — Encounter: Payer: Self-pay | Admitting: Dermatology

## 2022-12-27 ENCOUNTER — Other Ambulatory Visit: Payer: Self-pay

## 2022-12-27 ENCOUNTER — Encounter: Payer: Self-pay | Admitting: Family Medicine

## 2022-12-27 ENCOUNTER — Ambulatory Visit: Payer: Self-pay | Attending: Family Medicine | Admitting: Family Medicine

## 2022-12-27 VITALS — BP 104/66 | HR 84 | Ht 59.0 in | Wt 111.6 lb

## 2022-12-27 DIAGNOSIS — Z13228 Encounter for screening for other metabolic disorders: Secondary | ICD-10-CM

## 2022-12-27 DIAGNOSIS — C539 Malignant neoplasm of cervix uteri, unspecified: Secondary | ICD-10-CM

## 2022-12-27 DIAGNOSIS — M545 Low back pain, unspecified: Secondary | ICD-10-CM

## 2022-12-27 DIAGNOSIS — G4709 Other insomnia: Secondary | ICD-10-CM

## 2022-12-27 DIAGNOSIS — G8929 Other chronic pain: Secondary | ICD-10-CM

## 2022-12-27 MED ORDER — NAPROXEN 500 MG PO TABS
500.0000 mg | ORAL_TABLET | Freq: Two times a day (BID) | ORAL | 1 refills | Status: DC
  Filled 2022-12-27: qty 60, 30d supply, fill #0
  Filled 2023-02-08 (×2): qty 60, 30d supply, fill #1

## 2022-12-27 MED ORDER — CYCLOBENZAPRINE HCL 5 MG PO TABS
5.0000 mg | ORAL_TABLET | Freq: Three times a day (TID) | ORAL | 1 refills | Status: DC | PRN
  Filled 2022-12-27: qty 60, 20d supply, fill #0

## 2022-12-27 NOTE — Progress Notes (Signed)
Subjective:  Patient ID: Crystal [name], female    DOB: 12-Jul-1970  Age: 52 y.o. MRN: [id number]  CC: Medical Management of Chronic Issues   HPI Crystal [name] is a 52 y.o. year old female with a history of GERD, Stage 1B cervical cancer status post robotic-assisted type III radical laparoscopic hysterectomy with bilateral salpingectomy and bilateral sentinel lymph node biopsy in 06/2015 (no residual carcinoma as per GYN )   Interval History: Discussed the use of AI scribe software for clinical note transcription with the patient, who gave verbal consent to proceed.  The patient presents with a 'small pain' in her back, which is worse at night and currently rates as a 3/10. She takes two 500mg  Advil for severe pain, but has run out of her prescribed Naproxen, which she finds helpful. She also has a muscle relaxant for spasms, which she still has and does not need refilled.  She has been having difficulty sleeping and was prescribed Trazodone, but stopped taking it after three days due to feeling unwell.  She would like an answer medications due to trazodone intolerance.  Her acid reflux is controlled.  She last saw her GYN in 04/2022 and informs me she was told she would need Pap smears every 3 to 6 months.  Pelvic ultrasound was unremarkable from 04/2022.  She has not heard back from the gynecologist since her last visit in December 2023.        Past Medical History:  Diagnosis Date   Allergy    Cancer (HCC)    cervical cancer dx.    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   not cancer per pt   PONV (postoperative nausea and vomiting)     Past Surgical History:  Procedure Laterality Date   ABDOMINAL HYSTERECTOMY     BREAST BIOPSY Left 08/28/2015   MRI- High Risk   BREAST BIOPSY Right 08/28/2015   MRI- Benign   BREAST EXCISIONAL BIOPSY  Left 10/03/2015   BREAST LUMPECTOMY WITH RADIOACTIVE SEED LOCALIZATION Left 10/03/2015   Procedure: LEFT BREAST LUMPECTOMY WITH RADIOACTIVE SEED LOCALIZATION;  Surgeon: Claud Kelp, MD;  Location: [facility];  Service: General;  Laterality: Left;   CERVICAL BIOPSY  W/ LOOP ELECTRODE EXCISION     12'16   CESAREAN SECTION     2 previous   ROBOTIC ASSISTED TOTAL HYSTERECTOMY Bilateral 07/01/2015   Procedure: XI ROBOTIC ASSISTED RADICAL TYPE II HYSTERECTOMY, BILATERAL SALPINGECTOMY, SENTINAL LYMPH NODE BIOPSY;  Surgeon: Adolphus Birchwood, MD;  Location: [facility];  Service: Gynecology;  Laterality: Bilateral;    Family History  Problem Relation Age of Onset   Hypertension Sister    Atrial fibrillation Sister    Colon cancer Neg Hx    Esophageal cancer Neg Hx    Rectal cancer Neg Hx    Stomach cancer Neg Hx     Social History   Socioeconomic History   Marital status: Widowed    Spouse name: Not on file   Number of children: 2   Years of education: Not on file   Highest education level: 9th grade  Occupational History   Occupation: cleans homes  Tobacco Use   Smoking status: Never   Smokeless tobacco: Never  Vaping Use   Vaping status: Never Used  Substance and Sexual Activity   Alcohol use:  Yes    Comment: occasionally   Drug use: No   Sexual activity: Not Currently    Birth control/protection: Surgical  Other Topics Concern   Not on file  Social History Narrative   Not on file   Social Determinants of Health   Financial Resource Strain: Not on file  Food Insecurity: No Food Insecurity (03/02/2022)   Hunger Vital Sign    Worried About Running Out of Food in the Last Year: Never true    Ran Out of Food in the Last Year: Never true  Transportation Needs: No Transportation Needs (03/02/2022)   PRAPARE - Administrator, Civil Service (Medical): No    Lack of Transportation (Non-Medical): No  Physical Activity: Not on file  Stress: Not on file  Social  Connections: Not on file    Allergies  Allergen Reactions   Contrast Media [Iodinated Contrast Media] Itching    13 hr prep in the future; itching of tongue and around the mouth 03/02/2019    Outpatient Medications Prior to Visit  Medication Sig Dispense Refill   naproxen (NAPROSYN) 500 MG tablet Take 1 tablet (500 mg total) by mouth 2 (two) times daily with a meal. 60 tablet 1   hydroquinone 4 % cream Apply topically at bedtime. Apply topically to the face at night for three months then stop treatment (Patient not taking: Reported on 12/27/2022) 28.35 g 0   acetaminophen (TYLENOL) 650 MG CR tablet Take 1 tablet 3 times a day by oral route as needed, for back pain. (Patient not taking: Reported on 09/16/2022) 30 tablet 0   amoxicillin (AMOXIL) 875 MG tablet Take 1 tablet every 12 hours by oral route with meal(s) for 7 days, for UTI. (Patient not taking: Reported on 09/16/2022) 14 tablet 0   cetirizine (ZYRTEC) 10 MG tablet Take 1 tablet (10 mg total) by mouth daily. (Patient not taking: Reported on 09/16/2022) 30 tablet 1   cyclobenzaprine (FLEXERIL) 5 MG tablet Take 1 tablet (5 mg total) by mouth 3 (three) times daily as needed for muscle spasms. (Patient not taking: Reported on 06/28/2022) 30 tablet 0   cyclobenzaprine (FLEXERIL) 5 MG tablet Take 1 tablet (5 mg total) by mouth at bedtime for 5 days. (Patient not taking: Reported on 09/16/2022) 5 tablet 0   diclofenac Sodium (VOLTAREN) 1 % GEL Apply 2 g topically 4 (four) times daily. (Patient not taking: Reported on 09/16/2022) 200 g 0   ibuprofen (ADVIL) 800 MG tablet Take 1 tablet (800 mg total) by mouth 3 (three) times daily with meals for back pain. (Patient not taking: Reported on 09/16/2022) 30 tablet 0   nitrofurantoin, macrocrystal-monohydrate, (MACROBID) 100 MG capsule Take 1 capsule (100 mg total) by mouth every 12 (twelve) hours for 5 days (Patient not taking: Reported on 09/16/2022) 10 capsule 0   olopatadine (PATANOL) 0.1 % ophthalmic  solution Place 1 drop into both eyes 2 (two) times daily. (Patient not taking: Reported on 09/16/2022) 5 mL 1   phenazopyridine (PYRIDIUM) 200 MG tablet Take 1 tablet (200 mg total) by mouth 3 (three) times daily for 2 days. (Patient not taking: Reported on 09/16/2022) 6 tablet 0   polyethylene glycol (MIRALAX) 17 g packet Take 17 g by mouth 2 (two) times daily. (Patient not taking: Reported on 04/27/2022) 14 each 0   traZODone (DESYREL) 50 MG tablet Take 1 tablet (50 mg total) by mouth at bedtime as needed for sleep. (Patient not taking: Reported on 09/16/2022) 90 tablet 1  valACYclovir (VALTREX) 500 MG tablet Take 1 tablet (500 mg total) by mouth 2 (two) times daily. (Patient not taking: Reported on 06/28/2022) 14 tablet 0   No facility-administered medications prior to visit.     ROS Review of Systems  Constitutional:  Negative for activity change and appetite change.  HENT:  Negative for sinus pressure and sore throat.   Respiratory:  Negative for chest tightness, shortness of breath and wheezing.   Cardiovascular:  Negative for chest pain and palpitations.  Gastrointestinal:  Negative for abdominal distention, abdominal pain and constipation.  Genitourinary: Negative.   Musculoskeletal:  Positive for back pain.  Psychiatric/Behavioral:  Negative for behavioral problems and dysphoric mood.     Objective:  BP 104/66   Pulse 84   Ht 4\' 11"  (1.499 m)   Wt 111 lb 9.6 oz (50.6 kg)   LMP 07/01/2015   SpO2 96%   BMI 22.54 kg/m      12/27/2022    4:10 PM 08/03/2022   12:44 PM 06/28/2022    4:12 PM  BP/Weight  Systolic BP 104 110 122  Diastolic BP 66 67 71  Wt. (Lbs) 111.6 110 112.6  BMI 22.54 kg/m2 22.22 kg/m2 22.74 kg/m2      Physical Exam Constitutional:      Appearance: She is well-developed.  Cardiovascular:     Rate and Rhythm: Normal rate.     Heart sounds: Normal heart sounds. No murmur heard. Pulmonary:     Effort: Pulmonary effort is normal.     Breath sounds: Normal  breath sounds. No wheezing or rales.  Chest:     Chest wall: No tenderness.  Abdominal:     General: Bowel sounds are normal. There is no distension.     Palpations: Abdomen is soft. There is no mass.     Tenderness: There is no abdominal tenderness.  Musculoskeletal:        General: Tenderness (slight TTP of bilateral lumbar paraspinal region) present.     Right lower leg: No edema.     Left lower leg: No edema.  Neurological:     Mental Status: She is alert and oriented to person, place, and time.  Psychiatric:        Mood and Affect: Mood normal.        Latest Ref Rng & Units 03/26/2022    2:35 PM 12/23/2021    4:27 PM 01/30/2021    6:11 PM  CMP  Glucose 70 - 99 mg/dL 99  91  97   BUN 6 - 20 mg/dL 14  18  14    Creatinine 0.44 - 1.00 mg/dL 4.09  8.11  9.14   Sodium 135 - 145 mmol/L 137  138  137   Potassium 3.5 - 5.1 mmol/L 4.0  4.0  3.8   Chloride 98 - 111 mmol/L 107  100  105   CO2 22 - 32 mmol/L 19  22  23    Calcium 8.9 - 10.3 mg/dL 9.8  9.6  9.3   Total Protein 6.5 - 8.1 g/dL 7.6  7.8  7.2   Total Bilirubin 0.3 - 1.2 mg/dL 1.0  0.4  0.9   Alkaline Phos 38 - 126 U/L 82  105  83   AST 15 - 41 U/L 26  21  19    ALT 0 - 44 U/L 22  36  26     Lipid Panel     Component Value Date/Time   CHOL 181 11/03/2015 1017   TRIG  97 11/03/2015 1017   HDL 57 11/03/2015 1017   CHOLHDL 3.2 11/03/2015 1017   VLDL 19 11/03/2015 1017   LDLCALC 105 11/03/2015 1017    CBC    Component Value Date/Time   WBC 8.1 03/26/2022 1435   RBC 4.64 03/26/2022 1435   HGB 15.0 03/26/2022 1435   HGB 15.5 08/08/2019 1702   HGB 15.0 07/08/2015 1441   HCT 44.0 03/26/2022 1435   HCT 44.6 08/08/2019 1702   HCT 45.5 07/08/2015 1441   PLT 261 03/26/2022 1435   PLT 257 08/08/2019 1702   MCV 94.8 03/26/2022 1435   MCV 96 08/08/2019 1702   MCV 96.1 07/08/2015 1441   MCH 32.3 03/26/2022 1435   MCHC 34.1 03/26/2022 1435   RDW 12.0 03/26/2022 1435   RDW 11.9 08/08/2019 1702   RDW 12.4 07/08/2015 1441    LYMPHSABS 2.3 03/26/2022 1435   LYMPHSABS 2.0 08/08/2019 1702   LYMPHSABS 1.1 07/08/2015 1441   MONOABS 0.6 03/26/2022 1435   MONOABS 0.6 07/08/2015 1441   EOSABS 0.1 03/26/2022 1435   EOSABS 0.2 08/08/2019 1702   BASOSABS 0.0 03/26/2022 1435   BASOSABS 0.0 08/08/2019 1702   BASOSABS 0.1 07/08/2015 1441    Lab Results  Component Value Date   HGBA1C 5.5 04/04/2015    Assessment & Plan:      Back Pain Mild, mainly nocturnal back pain. Currently managed with Advil 500mg  as needed. Previously managed with Naproxen, which the patient reports as effective. -Refill Naproxen prescription. -Discontinue other NSAIDs -Placed on Flexeril  Insomnia Difficulty sleeping, previously managed with Trazodone. Trazodone discontinued due to side effects. -Recommend over-the-counter Melatonin 5mg  as a natural sleep aid.  Gastroesophageal Reflux Disease (GERD) Controlled, no changes reported. -Continue current management.  History of stage Ib cervical cancer Status post robotic-assisted type III radical laparoscopic hysterectomy with bilateral salpingectomy and bilateral sentinel lymph node biopsy in 06/2015 Patient reports irregular follow-up for abnormal Pap smears. Last visit in December 2023. -Provide contact information for Gynecology department to schedule follow-up appointment.  General Health Maintenance -Order basic blood panel to check kidney function. -Order fasting cholesterol test for tomorrow morning. Patient to return fasting for blood work.          Meds ordered this encounter  Medications   naproxen (NAPROSYN) 500 MG tablet    Sig: Take 1 tablet (500 mg total) by mouth 2 (two) times daily with a meal.    Dispense:  60 tablet    Refill:  1   cyclobenzaprine (FLEXERIL) 5 MG tablet    Sig: Take 1 tablet (5 mg total) by mouth 3 (three) times daily as needed for muscle spasms.    Dispense:  60 tablet    Refill:  1    Follow-up: Return in about 6 months (around  06/29/2023) for Chronic medical conditions.       Hoy Register, MD, FAAFP. [facility] [place], [place] [phone]   12/27/2022, 5:03 PM

## 2022-12-27 NOTE — Patient Instructions (Signed)
:   [address] [place], [place] [address] Hours:  Closes soon ? 5?PM ? Opens 7?AM Tue Phone: [phone]

## 2022-12-28 ENCOUNTER — Other Ambulatory Visit: Payer: Self-pay

## 2022-12-28 ENCOUNTER — Ambulatory Visit: Payer: Self-pay | Attending: Family Medicine

## 2022-12-28 DIAGNOSIS — Z13228 Encounter for screening for other metabolic disorders: Secondary | ICD-10-CM

## 2023-02-08 ENCOUNTER — Other Ambulatory Visit: Payer: Self-pay

## 2023-02-08 ENCOUNTER — Other Ambulatory Visit (HOSPITAL_COMMUNITY): Payer: Self-pay

## 2023-05-06 ENCOUNTER — Telehealth: Payer: Self-pay | Admitting: Family Medicine

## 2023-05-06 ENCOUNTER — Ambulatory Visit: Payer: Self-pay

## 2023-05-06 DIAGNOSIS — R040 Epistaxis: Secondary | ICD-10-CM

## 2023-05-06 NOTE — Progress Notes (Signed)
Virtual Visit Consent   Crystal [name], you are scheduled for a virtual visit with a [facility] provider today. Just as with appointments in the office, your consent must be obtained to participate. Your consent will be active for this visit and any virtual visit you may have with one of our providers in the next 365 days. If you have a MyChart account, a copy of this consent can be sent to you electronically.  As this is a virtual visit, video technology does not allow for your provider to perform a traditional examination. This may limit your provider's ability to fully assess your condition. If your provider identifies any concerns that need to be evaluated in person or the need to arrange testing (such as labs, EKG, etc.), we will make arrangements to do so. Although advances in technology are sophisticated, we cannot ensure that it will always work on either your end or our end. If the connection with a video visit is poor, the visit may have to be switched to a telephone visit. With either a video or telephone visit, we are not always able to ensure that we have a secure connection.  By engaging in this virtual visit, you consent to the provision of healthcare and authorize for your insurance to be billed (if applicable) for the services provided during this visit. Depending on your insurance coverage, you may receive a charge related to this service.  I need to obtain your verbal consent now. Are you willing to proceed with your visit today? Crystal [name] has provided verbal consent on 05/06/2023 for a virtual visit (video or telephone). Freddy Finner, NP  Date: 05/06/2023 12:45 PM  Virtual Visit via Video Note   I, Freddy Finner, connected with  Crystal [name]  ([id number], [birth year]) on 05/06/23 at 12:45 PM EST by a video-enabled telemedicine application and verified that I am speaking with the correct person using two  identifiers.  Location: Patient: Virtual Visit Location Patient: Home Provider: Virtual Visit Location Provider: Home Office   I discussed the limitations of evaluation and management by telemedicine and the availability of in person appointments. The patient expressed understanding and agreed to proceed.    History of Present Illness: Crystal [name] is a 52 y.o. who identifies as a female who was assigned female at birth, and is being seen today for nose bleeds. Daughter is present and helps with interpreting.  Nose bleed the last two days. No history of high blood pressure No history of allergies. Cervix cancer- 9 years ago Nose bleeding- described as - yesterday was light, and today heavy with blood clots., both spontaneous and not related to blowing or injury or trauma.   Also reports pain in neck, upper shoulders and back.    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 Apply topically to the face at night for three months then stop treatment (Patient not taking: Reported on 12/27/2022), Disp: 28.35 g, Rfl: 0   naproxen (NAPROSYN) 500 MG tablet, Take 1 tablet (500 mg total) by mouth 2 (two) times daily with a meal., Disp: 60 tablet, Rfl: 1  Observations/Objective: Patient is well-developed, well-nourished in no acute distress.  Resting comfortably  at home.  Head is normocephalic, atraumatic.  No labored breathing.  Speech is clear and coherent with logical content.  Patient is alert and oriented at baseline.    Assessment and Plan:  1. Epistaxis (Primary)  Advised of in person visit to see why she is having spontaneous bleeding. No charge.  Patient acknowledged agreement and understanding of the plan.     Follow Up Instructions: I discussed the assessment and treatment plan with the patient. The patient was provided an opportunity to ask questions and all were answered. The patient agreed with the plan and demonstrated an understanding of the instructions.  A copy of instructions were sent to the patient via MyChart unless otherwise noted below.    The patient was advised to call back or seek an in-person evaluation if the symptoms worsen or if the condition fails to improve as anticipated.    Freddy Finner, NP

## 2023-05-06 NOTE — Telephone Encounter (Signed)
  Chief Complaint: HA, neck pain, nosebleeds Symptoms: above Frequency: HA 1.5 weeks nose bleeds past 2 days Pertinent Negatives: Patient denies  Disposition: [] ED /[] Urgent Care (no appt availability in office) / [] Appointment(In office/virtual)/ [x]  [place] [facility]/ [] Home Care/ [] Refused Recommended Disposition /[] [place] [facility]/ []  Follow-up with PCP Additional Notes: Pt and daughter called. Daughter served as Equities trader. Pt states that she has a HA off and on for 1.5 weeks. Also neck and back pain. Pt has not taken any otc medication for this. Pt has also had 2 nose bleeds yesterday and today. No appts in office. Pt decided that VV would be best choice for today. VV scheduled.    Reason for Disposition  [1] MODERATE headache (e.g., interferes with normal activities) AND [2] present > 24 hours AND [3] unexplained  (Exceptions: analgesics not tried, typical migraine, or headache part of viral illness)  Answer Assessment - Initial Assessment Questions 1. LOCATION: "Where does it hurt?"      Headache 1.5 weeks - also BA - Front around eyes, neck and back 2. ONSET: "When did the headache start?" (Minutes, hours or days)      1.5 weeks 3. PATTERN: "Does the pain come and go, or has it been constant since it started?"     Comes and goes 4. SEVERITY: "How bad is the pain?" and "What does it keep you from doing?"  (e.g., Scale 1-10; mild, moderate, or severe)   - MILD (1-3): doesn't interfere with normal activities    - MODERATE (4-7): interferes with normal activities or awakens from sleep    - SEVERE (8-10): excruciating pain, unable to do any normal activities        6/10 5. RECURRENT SYMPTOM: "Have you ever had headaches before?" If Yes, ask: "When was the last time?" and "What happened that time?"      no 6. CAUSE: "What do you think is causing the headache?"     no 8. HEAD INJURY: "Has there been any recent injury to the head?"      no 9. OTHER SYMPTOMS: "Do you have  any other symptoms?" (fever, stiff neck, eye pain, sore throat, cold symptoms)     Back pain  Protocols used: Headache-A-AH

## 2023-05-06 NOTE — Telephone Encounter (Signed)
Noted  

## 2023-05-20 ENCOUNTER — Emergency Department (HOSPITAL_COMMUNITY): Payer: Self-pay

## 2023-05-20 ENCOUNTER — Emergency Department (HOSPITAL_COMMUNITY)
Admission: EM | Admit: 2023-05-20 | Discharge: 2023-05-20 | Disposition: A | Discharge status: Home / Self Care | Payer: Self-pay | Attending: Emergency Medicine | Admitting: Emergency Medicine

## 2023-05-20 DIAGNOSIS — R040 Epistaxis: Secondary | ICD-10-CM | POA: Insufficient documentation

## 2023-05-20 LAB — BASIC METABOLIC PANEL
Anion gap: 9 (ref 5–15)
BUN: 16 mg/dL (ref 6–20)
CO2: 21 mmol/L — ABNORMAL LOW (ref 22–32)
Calcium: 9.2 mg/dL (ref 8.9–10.3)
Chloride: 105 mmol/L (ref 98–111)
Creatinine, Ser: 0.72 mg/dL (ref 0.44–1.00)
GFR, Estimated: >60 mL/min (ref >60)
Glucose, Bld: 105 mg/dL — ABNORMAL HIGH (ref 70–99)
Potassium: 4.5 mmol/L (ref 3.5–5.1)
Sodium: 135 mmol/L (ref 135–145)

## 2023-05-20 LAB — CBC WITH DIFFERENTIAL/PLATELET
Abs Immature Granulocytes: 0.02 10*3/uL (ref 0.00–0.07)
Basophils Absolute: 0 10*3/uL (ref 0.0–0.1)
Basophils Relative: 0 %
Eosinophils Absolute: 0.1 10*3/uL (ref 0.0–0.5)
Eosinophils Relative: 1 %
HCT: 45.3 % (ref 36.0–46.0)
Hemoglobin: 15.2 g/dL — ABNORMAL HIGH (ref 12.0–15.0)
Immature Granulocytes: 0 %
Lymphocytes Relative: 23 %
Lymphs Abs: 2 10*3/uL (ref 0.7–4.0)
MCH: 32.1 pg (ref 26.0–34.0)
MCHC: 33.6 g/dL (ref 30.0–36.0)
MCV: 95.8 fL (ref 80.0–100.0)
Monocytes Absolute: 0.5 10*3/uL (ref 0.1–1.0)
Monocytes Relative: 6 %
Neutro Abs: 6 10*3/uL (ref 1.7–7.7)
Neutrophils Relative %: 70 %
Platelets: 284 10*3/uL (ref 150–400)
RBC: 4.73 MIL/uL (ref 3.87–5.11)
RDW: 12.4 % (ref 11.5–15.5)
WBC: 8.7 10*3/uL (ref 4.0–10.5)
nRBC: 0 % (ref 0.0–0.2)

## 2023-05-20 LAB — APTT: aPTT: 30 s (ref 24–36)

## 2023-05-20 LAB — PROTIME-INR
INR: 0.9 (ref 0.8–1.2)
Prothrombin Time: 12.3 s (ref 11.4–15.2)

## 2023-05-20 MED ORDER — OXYMETAZOLINE HCL 0.05 % NA SOLN
1.0000 | Freq: Two times a day (BID) | NASAL | 0 refills | Status: DC
  Filled 2023-05-20: qty 30, 150d supply, fill #0

## 2023-05-20 MED ORDER — SALINE SPRAY 0.65 % NA SOLN
1.0000 | NASAL | 0 refills | Status: DC | PRN
  Filled 2023-05-20: qty 30, 30d supply, fill #0

## 2023-05-20 NOTE — ED Triage Notes (Signed)
Pt c/o intermittent L sided nosebleeds for 8 days. Not actively bleeding during triage. Pt denies blood thinner use. NAD noted during triage.

## 2023-05-20 NOTE — Discharge Instructions (Addendum)
You can use the sodium chloride nasal spray 2 times per day for the next several weeks.  If you have a nosebleed, I would like you to hold pressure, and use the Afrin nasal spray to help stop the nosebleed.  Please follow-up with your primary care doctor.  Your blood work that was done today was normal.  You have a head CT scan that was also normal.

## 2023-05-20 NOTE — ED Provider Notes (Signed)
[place] EMERGENCY DEPARTMENT AT [facility] Provider Note   CSN: [id number] Arrival date & time: 05/20/23  1130     History  Chief Complaint  Patient presents with   Epistaxis    Baylyn Weygandt Ramirez-Flores is a 52 y.o. female.  This is a 52 year old female comes to the emergency department due to intermittent nosebleeds over the last 8 days.  She has not had nosebleeds previously.  Patient otherwise healthy.   Epistaxis      Home Medications Prior to Admission medications   Medication Sig Start Date End Date Taking? Authorizing Provider  cyclobenzaprine (FLEXERIL) 5 MG tablet Take 1 tablet (5 mg total) by mouth 3 (three) times daily as needed for muscle spasms. 12/27/22   Hoy Register, MD  hydroquinone 4 % cream Apply topically at bedtime. Apply topically to the face at night for three months then stop treatment Patient not taking: Reported on 12/27/2022 09/16/22   Terri Piedra, DO  naproxen (NAPROSYN) 500 MG tablet Take 1 tablet (500 mg total) by mouth 2 (two) times daily with a meal. 12/27/22   Hoy Register, MD      Allergies    Contrast media [iodinated contrast media]    Review of Systems   Review of Systems  HENT:  Positive for nosebleeds.     Physical Exam Updated Vital Signs BP 122/65   Pulse 76   Temp 98.3 F (36.8 C) (Oral)   Resp 14   LMP 07/01/2015   SpO2 98%  Physical Exam Vitals reviewed.  HENT:     Head: Normocephalic.     Nose:     Comments: Mildly erythematous turbinates bilaterally.  No active bleeding. Skin:    Coloration: Skin is not pale.  Neurological:     Mental Status: She is alert.     ED Results / Procedures / Treatments   Labs (all labs ordered are listed, but only abnormal results are displayed) Labs Reviewed  CBC WITH DIFFERENTIAL/PLATELET - Abnormal; Notable for the following components:      Result Value   Hemoglobin 15.2 (*)    All other components within normal limits  BASIC METABOLIC PANEL -  Abnormal; Notable for the following components:   CO2 21 (*)    Glucose, Bld 105 (*)    All other components within normal limits  APTT  PROTIME-INR    EKG None  Radiology CT Head Wo Contrast Result Date: 05/20/2023 CLINICAL DATA:  Headache, increasing frequency or severity. Epistaxis. EXAM: CT HEAD WITHOUT CONTRAST TECHNIQUE: Contiguous axial images were obtained from the base of the skull through the vertex without intravenous contrast. RADIATION DOSE REDUCTION: This exam was performed according to the departmental dose-optimization program which includes automated exposure control, adjustment of the mA and/or kV according to patient size and/or use of iterative reconstruction technique. COMPARISON:  None. FINDINGS: Brain: Cerebral volume is normal. There is no acute intracranial hemorrhage. No demarcated cortical infarct. No extra-axial fluid collection. No evidence of an intracranial mass. No midline shift. Vascular: No hyperdense vessel. Skull: No calvarial fracture or aggressive osseous lesion. Sinuses/Orbits: No mass or acute finding within the imaged orbits. Small-volume secretions within the right ethmoid air cell. IMPRESSION: 1.  No evidence of an acute intracranial abnormality. 2. Minor right ethmoid sinusitis. Electronically Signed   By: Jackey Loge D.O.   On: 05/20/2023 15:08    Procedures Procedures    Medications Ordered in ED Medications - No data to display  ED Course/ Medical Decision  Making/ A&P                                 Medical Decision Making 52 year old female here today for nosebleeds.  Plan-patient without any active bleeding.  Believe that this is likely due to winter weather.  Will prescribe the patient some saline flushes, counseled on using a humidifier in the bedroom.  Also send her prescription for Afrin as needed.  Patient had a CT scan done at triage for unclear reasons, I reviewed the CT imaging, per my independent review there is no  intracranial hemorrhage.  Patient with normal hemoglobin and platelets.           Final Clinical Impression(s) / ED Diagnoses Final diagnoses:  Epistaxis    Rx / DC Orders ED Discharge Orders     None         Arletha Pili, DO 05/20/23 2140

## 2023-05-20 NOTE — ED Provider Triage Note (Signed)
Emergency Medicine Provider Triage Evaluation Note  Crystal [name] , a 52 y.o. female  was evaluated in triage.  Pt complains of epistaxis and headaches.  Review of Systems  Positive:  Negative:   Physical Exam  BP (!) 108/55 (BP Location: Right Arm)   Pulse 85   Temp 98.3 F (36.8 C)   Resp 15   LMP 07/01/2015   SpO2 100%  Gen:   Awake, no distress   Resp:  Normal effort  MSK:   Moves extremities without difficulty  Other:    Medical Decision Making  Medically screening exam initiated at 1:49 PM.  Appropriate orders placed.  [name] was informed that the remainder of the evaluation will be completed by another provider, this initial triage assessment does not replace that evaluation, and the importance of remaining in the ED until their evaluation is complete.  Patient concerned for intermittent epistaxis over the past 1 week. This epistaxis resolves after about of pressure. Patient also concerned for intermittent headaches over the past 2 months which is new for her. Patient has a lot of anxiety given her breast cancer and wondering if brain tumor is causing her nose bleeding. I tried reassuring patient that nose bleeding is common in the dry, cold winter time - but patient still greatly concerned. Ordering imaging and labs.    Crystal [name], [place] 05/20/23 1352

## 2023-05-23 ENCOUNTER — Other Ambulatory Visit: Payer: Self-pay

## 2023-06-07 ENCOUNTER — Other Ambulatory Visit: Payer: Self-pay | Admitting: Family Medicine

## 2023-06-07 NOTE — Telephone Encounter (Signed)
 Medication Refill -  Most Recent Primary Care Visit:  Provider: [facility]  Department: [facility]  Visit Type: LAB  Date: 12/28/2022  Medication: Linzess  290mg   Has the patient contacted their pharmacy? no Pt thought she had to call in, I let her know can always contact pharmacy for refills  Is this the correct pharmacy for this prescription? yes  This is the patient's preferred pharmacy:  [facility] [address] [place] [address] Phone: [phone] Fax: [phone]   Has the prescription been filled recently? no  Is the patient out of the medication? yes  Has the patient been seen for an appointment in the last year OR does the patient have an upcoming appointment? yes  Can we respond through MyChart? yes  Agent: Please be advised that Rx refills may take up to 3 business days. We ask that you follow-up with your pharmacy.

## 2023-06-08 NOTE — Telephone Encounter (Signed)
 Unable to refill per protocol, Rx expired. Discontinued 04/27/22, patient preference.  Requested Prescriptions  Pending Prescriptions Disp Refills   linaclotide  (LINZESS ) 290 MCG CAPS capsule 90 capsule 0    Sig: Take 1 capsule (290 mcg total) by mouth daily before breakfast.     Gastroenterology: Irritable Bowel Syndrome Passed - 06/08/2023 11:20 AM      Passed - Valid encounter within last 12 months    Recent Outpatient Visits           5 months ago Other insomnia   [facility] [place]. [facility] Joaquin Mulberry, MD   11 months ago Larae Plaster of face   [facility] Of [place]. [facility] Joaquin Mulberry, MD   1 year ago Abdominal pain, unspecified abdominal location   [facility] - [facility] Dept Of [place]. [facility] [place], [place], [place]   2 years ago Herpes zoster without complication   [facility] Of [place]. [facility] Joaquin Mulberry, MD   2 years ago Carpal tunnel syndrome of right wrist    [facility] Dept Of [place]. [facility] [place], [place], [place]       Future Appointments             In 3 weeks Joaquin Mulberry, MD [facility] - A Dept Of [place]. [facility]

## 2023-06-29 ENCOUNTER — Other Ambulatory Visit: Payer: Self-pay

## 2023-06-29 ENCOUNTER — Encounter: Payer: Self-pay | Admitting: Family Medicine

## 2023-06-29 ENCOUNTER — Ambulatory Visit: Payer: Self-pay | Attending: Family Medicine | Admitting: Family Medicine

## 2023-06-29 VITALS — BP 105/66 | HR 82 | Ht 59.0 in | Wt 119.6 lb

## 2023-06-29 DIAGNOSIS — R319 Hematuria, unspecified: Secondary | ICD-10-CM

## 2023-06-29 DIAGNOSIS — R1031 Right lower quadrant pain: Secondary | ICD-10-CM

## 2023-06-29 DIAGNOSIS — K5909 Other constipation: Secondary | ICD-10-CM

## 2023-06-29 DIAGNOSIS — R140 Abdominal distension (gaseous): Secondary | ICD-10-CM

## 2023-06-29 LAB — POCT URINALYSIS DIP (CLINITEK)
Bilirubin, UA: NEGATIVE
Glucose, UA: NEGATIVE mg/dL
Ketones, POC UA: NEGATIVE mg/dL
Leukocytes, UA: NEGATIVE
Nitrite, UA: NEGATIVE
POC PROTEIN,UA: NEGATIVE
Spec Grav, UA: 1.02 (ref 1.010–1.025)
Urobilinogen, UA: 0.2 U/dL
pH, UA: 6.5 (ref 5.0–8.0)

## 2023-06-29 MED ORDER — LINACLOTIDE 290 MCG PO CAPS
290.0000 ug | ORAL_CAPSULE | Freq: Every day | ORAL | 1 refills | Status: DC
  Filled 2023-06-29 – 2023-06-30 (×2): qty 90, 90d supply, fill #0

## 2023-06-29 NOTE — Patient Instructions (Signed)
 Estreimiento, en adultos Constipation, Adult Estreimiento significa que una persona hace menos de tres deposiciones en una semana, tiene dificultades para defecar o las heces (deposiciones) son secas, duras o ms grandes de lo normal. La causa puede ser una afeccin subyacente. Puede empeorar con la edad si una persona toma ciertos medicamentos y no toma suficiente lquido. Siga estas instrucciones en su casa: Comida y bebida  Consuma alimentos con alto contenido de [place], como frijoles, cereales integrales, y frutas y verduras frescas. Limite el consumo de alimentos que sean bajos en fibra y ricos en grasas y azcares procesados, como los fritos y los dulces. Estos incluyen patatas fritas, hamburguesas, galletas, dulces y refrescos. Beba suficiente lquido como para Pharmacologist la orina de color amarillo plido. Instrucciones generales Haga actividad fsica habitualmente o como se lo haya indicado el mdico. Intente practicar 150 minutos de actividad fsica moderada por semana. Vaya al bao siempre que sienta ganas de defecar. No se aguante las ganas. Use los medicamentos de venta libre y los recetados solamente como se lo haya indicado el mdico. Esto incluye suplementos de [place]. En el momento de hacer sus deposiciones: Respire profundamente mientras relaja la parte inferior del abdomen. Practique la relajacin del suelo plvico. Controle su afeccin para detectar cualquier cambio. Informe a su mdico acerca de cualquier cambio. Concurra a todas las visitas de [address] se lo haya indicado el mdico. Esto es importante. Comunquese con un mdico si: Su dolor empeora. Tiene fiebre. No defeca despus de 4 das. Vomita. Baja de [place] o no tiene apetito. Tiene sangrado por la abertura entre las nalgas (ano). Las heces son delgadas como un lpiz. Solicite ayuda de inmediato si: Lance Muss, y los sntomas empeoran repentinamente. Observa que se filtran heces o hay sangre en las  heces. Tiene el abdomen distendido. Siente un dolor intenso en el abdomen. Se siente mareado o se desmaya. Resumen Estreimiento significa que una persona hace menos de tres deposiciones en una semana, tiene dificultades para defecar o las heces (deposiciones) son secas, duras o ms grandes de lo normal. Consuma alimentos con alto contenido de [place], como frijoles, cereales integrales, y frutas y verduras frescas. Beba suficiente lquido como para Pharmacologist la orina de color amarillo plido. Use los medicamentos de venta libre y los recetados solamente como se lo haya indicado el mdico. Esto incluye suplementos de [place]. Esta informacin no tiene Theme park manager el consejo del mdico. Asegrese de hacerle al mdico cualquier pregunta que tenga. Document Revised: 06/15/2019 Document Reviewed: 03/24/2022 Elsevier Patient Education  2024 [employer].

## 2023-06-29 NOTE — Progress Notes (Signed)
 Subjective:  Patient ID: Crystal [name], female    DOB: November 20, 1970  Age: 53 y.o. MRN: [id number]  CC: Medical Management of Chronic Issues (Constipation/Abdominal swelling)   HPI Crystal [name] is a 53 y.o. year old female with a history of GERD, Stage 1B cervical cancer status post robotic-assisted type III radical laparoscopic hysterectomy with bilateral salpingectomy and bilateral sentinel lymph node biopsy in 06/2015 (no residual carcinoma as per GYN )   Interval History: Discussed the use of AI scribe software for clinical note transcription with the patient, who gave verbal consent to proceed.  She presents with constipation and abdominal distension and pain. The discomfort is particularly notable on the right side of the abdomen. The patient describes the abdomen as becoming distended 'even if I don't eat anything.' She denies associated nausea, vomiting, and increased flatulence. The patient has been experiencing these symptoms for approximately two months. Right upper quadrant ultrasound from 03/2022 was unremarkable.  The patient has been previously prescribed Linzess  for constipation, but the medication has expired. She reports only taking the medication once before noticing it was expired. The patient denies taking any other medications at this time.   CT abdomen and pelvis from 01/2021 revealed: IMPRESSION: 1. Mild haziness of the peripancreatic fat. Correlation with pancreatic enzymes recommended to evaluate for possibility of acute pancreatitis. 2. Nonobstructing bilateral renal calculi. No hydronephrosis. 3. No bowel obstruction. Normal appendix.         Past Medical History:  Diagnosis Date   Allergy    Cancer (HCC)    cervical cancer dx.    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   not cancer per pt   PONV (postoperative  nausea and vomiting)     Past Surgical History:  Procedure Laterality Date   ABDOMINAL HYSTERECTOMY     BREAST BIOPSY Left 08/28/2015   MRI- High Risk   BREAST BIOPSY Right 08/28/2015   MRI- Benign   BREAST EXCISIONAL BIOPSY Left 10/03/2015   BREAST LUMPECTOMY WITH RADIOACTIVE SEED LOCALIZATION Left 10/03/2015   Procedure: LEFT BREAST LUMPECTOMY WITH RADIOACTIVE SEED LOCALIZATION;  Surgeon: Elon Pacini, MD;  Location: [facility];  Service: General;  Laterality: Left;   CERVICAL BIOPSY  W/ LOOP ELECTRODE EXCISION     12'16   CESAREAN SECTION     2 previous   ROBOTIC ASSISTED TOTAL HYSTERECTOMY Bilateral 07/01/2015   Procedure: XI ROBOTIC ASSISTED RADICAL TYPE II HYSTERECTOMY, BILATERAL SALPINGECTOMY, SENTINAL LYMPH NODE BIOPSY;  Surgeon: Maurilio Ship, MD;  Location: [facility];  Service: Gynecology;  Laterality: Bilateral;    Family History  Problem Relation Age of Onset   Hypertension Sister    Atrial fibrillation Sister    Colon cancer Neg Hx    Esophageal cancer Neg Hx    Rectal cancer Neg Hx    Stomach cancer Neg Hx     Social History   Socioeconomic History   Marital status: Widowed    Spouse name: Not on file   Number of children: 2   Years of education: Not on file   Highest education level: 9th grade  Occupational History   Occupation: cleans homes  Tobacco Use   Smoking status: Never   Smokeless tobacco: Never  Vaping Use   Vaping status: Never Used  Substance and Sexual Activity   Alcohol use:  Yes    Comment: occasionally   Drug use: No   Sexual activity: Not Currently    Birth control/protection: Surgical  Other Topics Concern   Not on file  Social History Narrative   Not on file   Social Drivers of Health   Financial Resource Strain: Not on file  Food Insecurity: No Food Insecurity (03/02/2022)   Hunger Vital Sign    Worried About Running Out of Food in the Last Year: Never true    Ran Out of Food in the Last Year: Never true   Transportation Needs: No Transportation Needs (03/02/2022)   PRAPARE - Administrator, Civil Service (Medical): No    Lack of Transportation (Non-Medical): No  Physical Activity: Not on file  Stress: Not on file  Social Connections: Not on file    Allergies  Allergen Reactions   Contrast Media [Iodinated Contrast Media] Itching    13 hr prep in the future; itching of tongue and around the mouth 03/02/2019    Outpatient Medications Prior to Visit  Medication Sig Dispense Refill   hydroquinone  4 % cream Apply topically at bedtime. Apply topically to the face at night for three months then stop treatment (Patient not taking: Reported on 06/29/2023) 28.35 g 0   sodium chloride  (OCEAN) 0.65 % SOLN nasal spray Place 1 spray into both nostrils as needed for congestion. 30 mL 0   cyclobenzaprine  (FLEXERIL ) 5 MG tablet Take 1 tablet (5 mg total) by mouth 3 (three) times daily as needed for muscle spasms. (Patient not taking: Reported on 06/29/2023) 60 tablet 1   naproxen  (NAPROSYN ) 500 MG tablet Take 1 tablet (500 mg total) by mouth 2 (two) times daily with a meal. (Patient not taking: Reported on 06/29/2023) 60 tablet 1   oxymetazoline  (AFRIN NASAL SPRAY) 0.05 % nasal spray Place 1 spray into both nostrils 2 (two) times daily. (Patient not taking: Reported on 06/29/2023) 30 mL 0   No facility-administered medications prior to visit.     ROS Review of Systems  Constitutional:  Negative for activity change and appetite change.  HENT:  Negative for sinus pressure and sore throat.   Respiratory:  Negative for chest tightness, shortness of breath and wheezing.   Cardiovascular:  Negative for chest pain and palpitations.  Gastrointestinal:  Positive for abdominal pain and constipation. Negative for abdominal distention.  Genitourinary: Negative.   Musculoskeletal: Negative.   Psychiatric/Behavioral:  Negative for behavioral problems and dysphoric mood.     Objective:  BP 105/66   Pulse  82   Ht 4' 11 (1.499 m)   Wt 119 lb 9.6 oz (54.3 kg)   LMP 07/01/2015   SpO2 98%   BMI 24.16 kg/m      06/29/2023    4:27 PM 05/20/2023   10:19 PM 05/20/2023   10:15 PM  BP/Weight  Systolic BP 105 118 118  Diastolic BP 66 76 76  Wt. (Lbs) 119.6    BMI 24.16 kg/m2        Physical Exam Constitutional:      Appearance: She is well-developed.  Cardiovascular:     Rate and Rhythm: Normal rate.     Heart sounds: Normal heart sounds. No murmur heard. Pulmonary:     Effort: Pulmonary effort is normal.     Breath sounds: Normal breath sounds. No wheezing or rales.  Chest:     Chest wall: No tenderness.  Abdominal:     General: Bowel sounds are normal. There is no  distension.     Palpations: Abdomen is soft. There is no mass.     Tenderness: There is abdominal tenderness (RLQ). There is right CVA tenderness.  Musculoskeletal:        General: Normal range of motion.     Right lower leg: No edema.     Left lower leg: No edema.  Neurological:     Mental Status: She is alert and oriented to person, place, and time.  Psychiatric:        Mood and Affect: Mood normal.        Latest Ref Rng & Units 05/20/2023    1:56 PM 12/28/2022    8:51 AM 03/26/2022    2:35 PM  CMP  Glucose 70 - 99 mg/dL 894  896  99   BUN 6 - 20 mg/dL 16  17  14    Creatinine 0.44 - 1.00 mg/dL 9.27  9.21  9.26   Sodium 135 - 145 mmol/L 135  139  137   Potassium 3.5 - 5.1 mmol/L 4.5  4.4  4.0   Chloride 98 - 111 mmol/L 105  104  107   CO2 22 - 32 mmol/L 21  19  19    Calcium 8.9 - 10.3 mg/dL 9.2  9.6  9.8   Total Protein 6.0 - 8.5 g/dL  7.8  7.6   Total Bilirubin 0.0 - 1.2 mg/dL  0.4  1.0   Alkaline Phos 44 - 121 IU/L  119  82   AST 0 - 40 IU/L  20  26   ALT 0 - 32 IU/L  26  22     Lipid Panel     Component Value Date/Time   CHOL 229 (H) 12/28/2022 0851   TRIG 114 12/28/2022 0851   HDL 62 12/28/2022 0851   CHOLHDL 3.2 11/03/2015 1017   VLDL 19 11/03/2015 1017   LDLCALC 147 (H) 12/28/2022 0851     CBC    Component Value Date/Time   WBC 8.7 05/20/2023 1356   RBC 4.73 05/20/2023 1356   HGB 15.2 (H) 05/20/2023 1356   HGB 15.4 12/28/2022 0851   HGB 15.0 07/08/2015 1441   HCT 45.3 05/20/2023 1356   HCT 45.5 12/28/2022 0851   HCT 45.5 07/08/2015 1441   PLT 284 05/20/2023 1356   PLT 264 12/28/2022 0851   MCV 95.8 05/20/2023 1356   MCV 95 12/28/2022 0851   MCV 96.1 07/08/2015 1441   MCH 32.1 05/20/2023 1356   MCHC 33.6 05/20/2023 1356   RDW 12.4 05/20/2023 1356   RDW 11.8 12/28/2022 0851   RDW 12.4 07/08/2015 1441   LYMPHSABS 2.0 05/20/2023 1356   LYMPHSABS 1.6 12/28/2022 0851   LYMPHSABS 1.1 07/08/2015 1441   MONOABS 0.5 05/20/2023 1356   MONOABS 0.6 07/08/2015 1441   EOSABS 0.1 05/20/2023 1356   EOSABS 0.1 12/28/2022 0851   BASOSABS 0.0 05/20/2023 1356   BASOSABS 0.0 12/28/2022 0851   BASOSABS 0.1 07/08/2015 1441    Lab Results  Component Value Date   HGBA1C 5.8 (H) 12/28/2022    Assessment & Plan:      Constipation Patient reports constipation and has been using expired Linzess . -Rescribe Linzess  and advise patient to take it daily. -Counseled on increasing fiber intake, fruits and vegetable, limit intake of foods like cheese, white bread, white rice   Abdominal distension and pain Patient reports abdominal distension and right lower quadrant pain for the past two months. No nausea, vomiting, or blood in urine. -Order urinalysis -  Constipation could also be contributing  Hematuria -Noted on UA -Previous history of renal calculi -CT renal stone protocol ordered.  History of Kidney Stones Patient has a history of kidney stones in both kidneys. -Monitor for symptoms and order CT scan if urine test shows blood.          Meds ordered this encounter  Medications   linaclotide  (LINZESS ) 290 MCG CAPS capsule    Sig: Take 1 capsule (290 mcg total) by mouth daily before breakfast.    Dispense:  90 capsule    Refill:  1    Follow-up: Return if  symptoms worsen or fail to improve.       Corrina Sabin, MD, FAAFP. [facility] [place], [place] [phone]   06/29/2023, 5:00 PM

## 2023-06-30 ENCOUNTER — Other Ambulatory Visit: Payer: Self-pay

## 2023-07-01 ENCOUNTER — Other Ambulatory Visit: Payer: Self-pay

## 2023-07-06 ENCOUNTER — Other Ambulatory Visit: Payer: Self-pay

## 2023-07-11 ENCOUNTER — Telehealth: Payer: Self-pay

## 2023-07-11 ENCOUNTER — Other Ambulatory Visit: Payer: Self-pay

## 2023-07-11 NOTE — Telephone Encounter (Signed)
Submitted application for LINZESS to [employer] for patient assistance.   Phone: [phone]

## 2023-07-11 NOTE — Telephone Encounter (Signed)
 Telephoned patient at mobile number using interpreter#[phone]. Mailbox full [facility]

## 2023-07-13 ENCOUNTER — Telehealth: Payer: Self-pay

## 2023-07-13 ENCOUNTER — Other Ambulatory Visit: Payer: Self-pay

## 2023-07-13 NOTE — Telephone Encounter (Signed)
 Received notification from [employer] regarding approval for [facility]. Patient assistance approved from 07/13/2023 to 07/12/2024.  Medication will ship to [address], [place], [address]  Pt ID: NONE GIVEN AT THIS TIME  Company phone: [phone]  *PER ENROLLMENT NOTIFICATION LETTER (UPLOADED TO MEDIA) [employer] WILL BE CONTACTING PATIENT TO SCHEDULE 1ST SHIPMENT.

## 2023-07-18 ENCOUNTER — Other Ambulatory Visit: Payer: Self-pay

## 2023-08-01 ENCOUNTER — Other Ambulatory Visit: Payer: Self-pay

## 2023-08-01 DIAGNOSIS — N644 Mastodynia: Secondary | ICD-10-CM

## 2023-09-05 ENCOUNTER — Ambulatory Visit: Payer: Self-pay

## 2023-09-05 NOTE — Telephone Encounter (Signed)
 Copied from CRM [id number]. Topic: Clinical - Red Word Triage >> Sep 05, 2023 10:07 AM Annelle Kiel wrote: Red Word that prompted transfer to Nurse Triage: patient has been having really bad pain in her arm for three weeks   Chief Complaint: Right Arm Pain Symptoms: Pain, Swelling, Weakness Frequency: 3 Weeks Pertinent Negatives: Patient denies fever,  Disposition: [] ED /[x] Urgent Care (no appt availability in office) / [] Appointment(In office/virtual)/ []  [place] [facility]/ [] Home Care/ [x] Refused Recommended Disposition /[] [place] [facility]/ []  Follow-up with PCP Additional Notes: MF is being triaged for pain in her right arm that ranges from a 5-9 on a numeric pain scale for three weeks. Recommended the patient seek urgent care due to lack of appointment availability. Patient declined and wanted to make an appointment with her PCP and have an RX called in for her. Provided education, verbalized understanding, continued with alternate disposition regardless.   Reason for Disposition . Weakness (i.e., loss of strength) in hand or fingers  (Exception: Not truly weak; hand feels weak because of pain.)  Answer Assessment - Initial Assessment Questions 1. ONSET: "When did the pain start?"     3 Weeks   2. LOCATION: "Where is the pain located?"     Right Arm, Around the elbow mostly  3. PAIN: "How bad is the pain?" (Scale 1-10; or mild, moderate, severe)   - MILD (1-3): Doesn't interfere with normal activities.   - MODERATE (4-7): Interferes with normal activities (e.g., work or school) or awakens from sleep.   - SEVERE (8-10): Excruciating pain, unable to do any normal activities, unable to hold a cup of water.     9  4. WORK OR EXERCISE: "Has there been any recent work or exercise that involved this part of the body?"     Housekeeper for years  5. CAUSE: "What do you think is causing the arm pain?"      Unsure  6. OTHER SYMPTOMS: "Do you have any other symptoms?" (e.g., neck  pain, swelling, rash, fever, numbness, weakness)     Swelling,   7. PREGNANCY: "Is there any chance you are pregnant?" "When was your last menstrual period?"     No and No  Protocols used: Arm Pain-A-AH

## 2023-09-08 ENCOUNTER — Encounter: Payer: Self-pay | Admitting: Obstetrics & Gynecology

## 2023-09-08 ENCOUNTER — Ambulatory Visit: Payer: Self-pay | Admitting: Hematology and Oncology

## 2023-09-08 ENCOUNTER — Ambulatory Visit
Admission: RE | Admit: 2023-09-08 | Discharge: 2023-09-08 | Disposition: A | Discharge status: Home / Self Care | Payer: Self-pay | Source: Ambulatory Visit | Attending: Obstetrics and Gynecology | Admitting: Obstetrics and Gynecology

## 2023-09-08 ENCOUNTER — Ambulatory Visit: Payer: Self-pay

## 2023-09-08 VITALS — BP 93/52 | Wt 116.0 lb

## 2023-09-08 DIAGNOSIS — N644 Mastodynia: Secondary | ICD-10-CM

## 2023-09-08 DIAGNOSIS — N6310 Unspecified lump in the right breast, unspecified quadrant: Secondary | ICD-10-CM

## 2023-09-08 NOTE — Progress Notes (Signed)
 [name] is a 53 y.o. female who presents to [facility] clinic today with complaint of right breast mass.    Pap Smear: Pap not smear completed today. Last Pap smear was 03/12/2020 and was normal. Per patient has history of an abnormal Pap smear. Last Pap smear result is available in Epic. History of cervical cancer treated with radical hysterectomy. Last year attempted 4 different Paps through multiple clinics, all with unsatisfactory results. Patient with severe vaginal atrophy. Suggested vaginal estrogen last year; however, patient could not afford it. Will consider colposcopy.    Physical exam: Breasts Breasts symmetrical. No skin abnormalities bilateral breasts. No nipple retraction bilateral breasts. No nipple discharge bilateral breasts. No lymphadenopathy. No lumps palpated left breast. Lump noted at 11 o'clock in the right breast.        Pelvic/Bimanual Pap is not indicated today    Smoking History: Patient has never smoked and was not referred to quit line.    Patient Navigation: Patient education provided. Access to services provided for patient through [facility] program. Herma Longest interpreter provided. No transportation provided   Colorectal Cancer Screening: Per patient has never had colonoscopy completed No complaints today.    Breast and Cervical Cancer Risk Assessment: Patient does not have family history of breast cancer, known genetic mutations, or radiation treatment to the chest before age 52. Patient does not have history of cervical dysplasia, immunocompromised, or DES exposure in-utero.  Risk Assessment   No risk assessment data       A: [facility] exam without pap smear Complaint of right breast lump as noted on exam. Will proceed with diagnostic imaging today.   P: Referred patient to the [facility] for a diagnostic mammogram. Appointment scheduled 09/08/2023.  Adelaide Adjutant, NP 09/08/2023 1:55 PM

## 2023-09-08 NOTE — Patient Instructions (Signed)
 Taught Crystal [name] about self breast awareness and gave educational materials to take home. Patient did not need a Pap smear today due to last Pap smear was in 2023 per patient. Told patient about free cervical cancer screenings to receive a Pap smear if would like one next year. Let her know [facility] will cover Pap smears every 5 years unless has a history of abnormal Pap smears. Referred patient to the [facility] for diagnostic mammogram. Appointment scheduled for 09/08/2023. Patient aware of appointment and will be there. Let patient know will follow up with her within the next couple weeks with results. Crystal [name] verbalized understanding.  Adelaide Adjutant, NP 2:05 PM

## 2023-09-15 ENCOUNTER — Ambulatory Visit: Payer: Self-pay | Admitting: Physician Assistant

## 2023-09-28 ENCOUNTER — Other Ambulatory Visit: Payer: Self-pay

## 2023-09-28 ENCOUNTER — Ambulatory Visit: Payer: Self-pay | Attending: Physician Assistant | Admitting: Physician Assistant

## 2023-09-28 VITALS — BP 98/60 | HR 77 | Temp 97.8°F | Ht 59.0 in | Wt 116.0 lb

## 2023-09-28 DIAGNOSIS — G5621 Lesion of ulnar nerve, right upper limb: Secondary | ICD-10-CM

## 2023-09-28 DIAGNOSIS — Z603 Acculturation difficulty: Secondary | ICD-10-CM

## 2023-09-28 DIAGNOSIS — M79601 Pain in right arm: Secondary | ICD-10-CM

## 2023-09-28 DIAGNOSIS — G5601 Carpal tunnel syndrome, right upper limb: Secondary | ICD-10-CM

## 2023-09-28 DIAGNOSIS — Z758 Other problems related to medical facilities and other health care: Secondary | ICD-10-CM

## 2023-09-28 MED ORDER — METHOCARBAMOL 500 MG PO TABS
1000.0000 mg | ORAL_TABLET | Freq: Three times a day (TID) | ORAL | 0 refills | Status: AC | PRN
  Filled 2023-09-28: qty 90, 15d supply, fill #0

## 2023-09-28 MED ORDER — NAPROXEN 500 MG PO TABS
500.0000 mg | ORAL_TABLET | Freq: Two times a day (BID) | ORAL | 1 refills | Status: AC
  Filled 2023-09-28: qty 60, 30d supply, fill #0

## 2023-09-28 NOTE — Progress Notes (Signed)
 Patient ID: Crystal [name], female   DOB: 02-Feb-1971, 53 y.o.   MRN: [id number]   Crystal [name], is a 53 y.o. female  WGN:[id number]  HQI:[id number]  DOB - Oct 08, 1970  Chief Complaint  Patient presents with   Arm Pain    R arm pain X2 mo - increased pain when lifting heavier things at work        Subjective:   Crystal [name] is a 53 y.o. female here today for a 3 week h/o R elbow pain that radiates up into the R trapezius area and into the R hand.  Awakens with her 4 fingers being nub and painful. Pain in elbow and whole R arm for about 3 to 4 weeks.  She is R hand dominant.  She is having trouble lifting buckets and doing cleaning due to pain.    No problems updated.  ALLERGIES: Allergies  Allergen Reactions   Contrast Media [Iodinated Contrast Media] Itching    13 hr prep in the future; itching of tongue and around the mouth 03/02/2019    PAST MEDICAL HISTORY: Past Medical History:  Diagnosis Date   Allergy    Cancer (HCC)    cervical cancer dx.    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 Authorizing Provider  linaclotide  (LINZESS ) 290 MCG CAPS capsule Take 1 capsule (290 mcg total) by mouth daily before breakfast. 06/29/23  Yes Newlin, Enobong, MD  methocarbamol  (ROBAXIN ) 500 MG tablet Take 2 tablets (1,000 mg total) by mouth every 8 (eight) hours as needed. 09/28/23  Yes Dulce Gibbs M, PA-C  naproxen  (NAPROSYN ) 500 MG tablet Take 1 tablet (500 mg total) by mouth 2 (two) times daily with a meal. 09/28/23  Yes Debara Kamphuis M, PA-C  sodium chloride  (OCEAN) 0.65 % SOLN nasal spray Place 1 spray into both nostrils as needed for congestion. 05/20/23 06/19/23  Afton Horse T,  DO    ROS: Neg HEENT Neg resp Neg cardiac Neg GI Neg GU Neg psych Neg neuro  Objective:   Vitals:   09/28/23 1605  BP: 98/60  Pulse: 77  Temp: 97.8 F (36.6 C)  TempSrc: Oral  SpO2: 98%  Weight: 116 lb (52.6 kg)  Height: 4\' 11"  (1.499 m)   Exam General appearance : Awake, alert, not in any distress. Speech Clear. Not toxic looking HEENT: Atraumatic and Normocephalic Neck: Supple, no JVD. No cervical lymphadenopathy.  Chest: Good air entry bilaterally, CTAB.  No rales/rhonchi/wheezing CVS: S1 S2 regular, no murmurs.  R trapezius spasm.  TTP over R ulna.  +phalen's, neg tinels.  Good grip strength.  No cspine TTP Extremities: B/L Lower Ext shows no edema, both legs are warm to touch Neurology: Awake alert, and oriented X 3, CN II-XII intact, Non focal Skin: No Rash  Data Review Lab Results  Component Value Date   HGBA1C 5.8 (H) 12/28/2022   HGBA1C 5.5 04/04/2015    Assessment & Plan   1. Right arm pain (Primary) - naproxen  (NAPROSYN ) 500 MG tablet; Take 1 tablet (500 mg total) by mouth 2 (two) times daily with a meal.  Dispense: 60 tablet; Refill: 1 - methocarbamol  (ROBAXIN ) 500 MG  tablet; Take 2 tablets (1,000 mg total) by mouth every 8 (eight) hours as needed.  Dispense: 90 tablet; Refill: 0 - Ambulatory referral to Orthopedic Surgery  2. Ulnar neuropathy at elbow of right upper extremity - naproxen  (NAPROSYN ) 500 MG tablet; Take 1 tablet (500 mg total) by mouth 2 (two) times daily with a meal.  Dispense: 60 tablet; Refill: 1 - methocarbamol  (ROBAXIN ) 500 MG tablet; Take 2 tablets (1,000 mg total) by mouth every 8 (eight) hours as needed.  Dispense: 90 tablet; Refill: 0 - Ambulatory referral to Orthopedic Surgery  3. Right carpal tunnel syndrome - naproxen  (NAPROSYN ) 500 MG tablet; Take 1 tablet (500 mg total) by mouth 2 (two) times daily with a meal.  Dispense: 60 tablet; Refill: 1 - methocarbamol  (ROBAXIN ) 500 MG tablet; Take 2 tablets (1,000 mg total) by  mouth every 8 (eight) hours as needed.  Dispense: 90 tablet; Refill: 0 - Ambulatory referral to Orthopedic Surgery  4. Language barrier Job Mulch [phone] with [employer] interpreters used and additional time performing visit was required.     Return if symptoms worsen or fail to improve, for PCP for chronic conditions.  The patient was given clear instructions to go to ER or return to medical center if symptoms don't improve, worsen or new problems develop. The patient verbalized understanding. The patient was told to call to get lab results if they haven't heard anything in the next week.      Dulce Gibbs, PA-C [facility] [place], [place] [phone]   09/28/2023, 4:21 PM

## 2023-09-28 NOTE — Patient Instructions (Signed)
 Pinzamiento del nervio de la mueca (sndrome del tnel carpiano): Qu debe saber Pinched Nerve in the Wrist (Carpal Tunnel Syndrome): What to Know  El pinzamiento del nervio de la [place] (sndrome del tnel carpiano o STC) es un problema nervioso que Passenger transport manager, entumecimiento y debilidad en la [place], la mano y los dedos. El tnel carpiano es un espacio estrecho que se encuentra en el lado palmar de la [place]. Los movimientos repetitivos de la mueca o determinadas enfermedades pueden causar hinchazn en el tnel. Esta hinchazn puede comprimir el nervio principal de la mueca (el nervio mediano). Cules son las causas? Las causas del STC pueden ser las siguientes: Mover la mano y la [place] y otra vez mientras realiza una tarea. Lastimarse la [place]. Artritis. Una bolsa llena de lquido (quiste) o un crecimiento (tumor) en el tnel carpiano. Acumulacin de lquido durante el embarazo. Uso de herramientas que vibran. En algunos casos, se desconoce la causa del STC. Qu incrementa el riesgo? Es ms probable que tenga el STC si: Tiene un trabajo en el que deba hacer estas cosas: Mover la mano con firmeza una y Ava. Trabajar con herramientas que vibran, como taladros o lijadoras. Es mujer. Tiene diabetes, obesidad, problemas de tiroides o insuficiencia renal. Cules son los signos o sntomas? Los sntomas de esta afeccin incluyen: Sensacin de hormigueo en los dedos. Puede sentir [employer], el dedo ndice o el dedo corazn. Hormigueo o prdida de la sensibilidad de la mano. Dolor en todo el brazo. Este dolor puede empeorar al flexionar la [place] y el codo durante [place]. Dolor en la mueca que sube por el brazo hasta el hombro. Dolor que baja hasta la palma de la mano o los dedos. Debilidad en las manos. Puede resultarle difcil tomar y Personal assistant. Los sntomas pueden empeorar durante la noche. Cmo se diagnostica? El STC se diagnostica mediante  la historia clnica y un examen fsico. Tambin pueden hacerle pruebas y estudios de diagnstico por imgenes para: Revisar las Catering manager que los nervios les envan a los msculos. Determinar si las seales elctricas pasan correctamente por los nervios. Determinar las posibles causas del STC. Estos incluyen radiografas, ecografa y Health visitor (RM). Cmo se trata? El STC puede tratarse de las siguientes maneras: Cambios en el estilo de vida. Se le pedir que deje o cambie la actividad que caus el problema. Fisioterapia. Puede incluir: Ejercicios que estiran y [employer] y tendones de la mueca y la [place]. Ejercicios de deslizamiento o movilizacin de los nervios. Estos ayudan a que los nervios se muevan fluidamente dentro de los tejidos [employer] rodean. Terapia ocupacional. Aprender a usar la mano nuevamente. Medicamentos para Chief Technology Officer y la hinchazn. Es posible que le apliquen inyecciones en la [place]. Una frula o un dispositivo ortopdico para la [place]. [place]. Siga estas indicaciones en su casa: Si tiene una frula o un dispositivo ortopdico: Use la frula o el dispositivo ortopdico como se lo hayan indicado. Quteselos solo si el mdico lo autoriza. Controle [employer] piel a su alrededor. Informe al mdico si observa problemas. Afloje la frula o el dispositivo ortopdico si los dedos se le entumecen, siente hormigueo o se le enfran y se tornan de Research officer, trade union. Mantenga la frula o el dispositivo ortopdico limpios y secos. Si la frula o el dispositivo ortopdico no son impermeables: No deje que se mojen. Cbralos para ducharse o baarse. Use una cubierta que no permita que [place]. Control  del dolor, la rigidez y la hinchazn  Use hielo o una bolsa de hielo como se lo hayan indicado. Si tiene una frula o un dispositivo ortopdico que se pueden sacar, quteselos como se lo hayan indicado. Coloque una toalla entre la piel y el  hielo. Coloque el hielo durante 20 minutos, 2 a 3 veces por da. Si la piel se le pone de color rojo, quite el hielo de inmediato para evitar daos en la piel. El [place] de dao es mayor si no puede sentir dolor, Airline pilot o fro. Mueva los dedos de la mano con frecuencia para reducir la rigidez y la hinchazn. Indicaciones generales Use los medicamentos nicamente segn las indicaciones. Descanse la [place] y la mano de cualquier actividad que le cause dolor. Si la causa del STC es algo que hace en el [place], hable con su empleador sobre hacer cambios. Por ejemplo, es posible que necesite usar una almohadilla para la mueca mientras escribe en el teclado. Haga ejercicio segn las indicaciones. Siga las instrucciones para hacer los ejercicios de deslizamiento o movilizacin de los nervios. Estos ayudan a que los nervios se muevan fluidamente dentro de los tejidos [employer] rodean. Concurra a todas las visitas de seguimiento. Esto es importante. Dnde obtener ms informacin American Academy of Orthopedic Surgeons (Academia Estadounidense de Cirujanos Ortopdicos): orthoinfo.aaos.Dana Corporation of Neurological Disorders and Stroke (Instituto Nacional de Trastornos Neurolgicos y Accidentes Cerebrovasculares): BasicFM.no Comunquese con un mdico si: Aparecen nuevos sntomas. El dolor no se alivia con los [employer]. Los sntomas empeoran. Solicite ayuda de inmediato si: La mano o la mueca se le adormecen o siente hormigueos, y los sntomas se hacen muy intensos. Esta informacin no tiene Theme park manager el consejo del mdico. Asegrese de hacerle al mdico cualquier pregunta que tenga. Document Revised: 01/25/2023 Document Reviewed: 01/25/2023 Elsevier Patient Education  2024 Elsevier Inc.Sndrome del tnel cubital Cubital Tunnel Syndrome  El sndrome del tnel cubital es una afeccin que causa dolor, adormecimiento, hormigueo y debilidad en el antebrazo y la [place]. Ocurre cuando  el nervio cubital est irritado o pinzado (comprimido). El nervio cubital se extiende desde el hombro hasta el dedo pequeo (meique) de la [place]. En la [employer], la causa del sndrome del tnel cubital es el movimiento del brazo que se realiza una y otra vez al practicar deportes o al trabajar. Cules son las causas? Esta afeccin puede ser causada por lo siguiente: Presin sobre el nervio cubital. Puede ser consecuencia de lo siguiente: Flexin repetida del codo. Huesos rotos mal consolidados (fracturas). Tumores en el codo. En la [employer], esos tumores no son cancerosos. Formacin de tejido cicatricial en el codo despus de una lesin. Crecimientos de hueso (osteofitos) cerca del nervio cubital. Estiramiento del nervio. Esto puede suceder [employer] tejidos que conectan los [employer] s (ligamentos) se aflojan. Traumatismo del [employer] zona del codo. Qu incrementa el riesgo? Es ms probable que tenga esta afeccin si: Realiza trabajo manual y tiene que flexionar mucho el codo. Practica deportes que implican hacer muchos movimientos de lanzamiento, como el bisbol. Practica deportes de contacto, como el ftbol americano o Film/video editor. No hace un calentamiento suficiente antes de realizar actividades. Tiene diabetes. Sufre de hipotiroidismo. Esto ocurre cuando la tiroides no produce la cantidad suficiente de hormonas. Cules son los signos o sntomas? Los sntomas de esta afeccin incluyen: Torpeza y debilidad en la mano. Tambin es posible que no pueda agarrar o pellizcar con firmeza. Dolor, Dentist o sensibilidad  en la zona interna del codo, el antebrazo o los dedos. Es posible que lo sienta en mayor medida en el meique o en el dedo anular. Ms dolor al forzar el codo para flexionarlo o un dolor punzante desde el codo hasta la [place]. Disminucin del control al lanzar objetos. Hormigueo, adormecimiento o una sensacin de ardor en el antebrazo, la mano o  los dedos. Es posible que lo sienta en mayor medida en el meique o en el dedo anular. Cmo se diagnostica? Esta afeccin se diagnostica en funcin de los sntomas, los antecedentes mdicos y un examen fsico. El mdico le preguntar si tuvo lesiones. Tambin pueden hacerle pruebas, por ejemplo: Un electromiograma (EMG). Esto se realiza para verificar si los nervios funcionan adecuadamente. Un estudio de conduccin nerviosa (ECN). Este estudio permite determinar cmo las seales elctricas pasan por los nervios. Pruebas de diagnstico por imgenes, como radiografas, una ecografa y una resonancia magntica (RM). Estas se realizan para Contractor origen de los [place]. Cmo se trata? El tratamiento para esta afeccin puede incluir lo siguiente: Dejar de Education officer, environmental las actividades que [employer] sntomas. Aplicar hielo en el codo. Tomar medicamentos para aliviar el dolor y reducir la hinchazn. Usar un dispositivo ortopdico desmontable. Esto impide que el codo se doble. Usar una codera donde el nervio cubital est ms cerca de la piel. Trabajar con un fisioterapeuta. Esto puede ayudar a [employer]. Tambin puede ayudar a [place] fuerza y la amplitud de movimiento del codo, el Product manager y Engineer, site. Corticoesteroides. Estos son medicamentos que se inyectan en el cuerpo para reducir la inflamacin. Si estos tratamientos no resultan eficaces, tal vez deba someterse a [place]. Siga estas instrucciones en su casa: Medicamentos Use los medicamentos de venta libre y los recetados solamente como se lo haya indicado el mdico. Pregntele al mdico si el medicamento recetado le impide conducir o usar maquinaria. Si tiene un dispositivo ortopdico extrable: Use el dispositivo ortopdico como se lo haya indicado el mdico. Quteselo solamente como se lo haya indicado el mdico. Controle todos los das la piel alrededor del dispositivo ortopdico. Informe al mdico acerca de  cualquier inquietud. Afloje el dispositivo ortopdico si los dedos de las manos se le adormecen, siente hormigueos o se le enfran y se tornan de Research officer, trade union. Mantenga limpio el dispositivo ortopdico. Si el dispositivo ortopdico no es impermeable: No deje que se moje. Cbralo con un envoltorio hermtico cuando tome un bao de inmersin o una ducha. Control del dolor, la rigidez y la hinchazn  Aplique hielo sobre la zona lesionada si se lo indican. Si tiene un dispositivo ortopdico desmontable, quteselo como se lo haya indicado el mdico. Ponga el hielo en una bolsa plstica. Coloque una toalla entre la piel y la bolsa. Aplique el hielo durante 20 minutos, 2 a 3 veces por da. Si la piel se le pone de color rojo brillante, retire el hielo de inmediato para evitar daos en la piel. El [place] de dao es mayor si no puede sentir dolor, Airline pilot o fro. Mueva los dedos de la mano con frecuencia para reducir la rigidez y la hinchazn. Cuando est sentado o acostado, levante (eleve) la zona lesionada por encima del nivel del corazn. Instrucciones generales [employer] ejercicios o la fisioterapia como se lo haya indicado el mdico. Si le indicaron que use una codera o un dispositivo ortopdico, selo como se lo haya indicado el mdico. Concurra a todas las visitas de seguimiento. El mdico controlar si los sntomas  mejoran. Tambin puede modificar el tratamiento si es necesario. Comunquese con un mdico si: Sus sntomas empeoran. Los sntomas no mejoran con Scientist, research (medical). Siente un dolor nuevo. Siente fro o adormecimiento en la mano del lado lesionado. Esta informacin no tiene Theme park manager el consejo del mdico. Asegrese de hacerle al mdico cualquier pregunta que tenga. Document Revised: 05/10/2022 Document Reviewed: 05/10/2022 Elsevier Patient Education  2024 [employer].

## 2023-10-27 ENCOUNTER — Other Ambulatory Visit: Payer: Self-pay

## 2023-11-18 ENCOUNTER — Other Ambulatory Visit: Payer: Self-pay

## 2023-12-10 ENCOUNTER — Other Ambulatory Visit: Payer: Self-pay

## 2023-12-10 ENCOUNTER — Emergency Department (HOSPITAL_COMMUNITY)

## 2023-12-10 ENCOUNTER — Encounter (HOSPITAL_COMMUNITY): Payer: Self-pay

## 2023-12-10 ENCOUNTER — Emergency Department (HOSPITAL_COMMUNITY)
Admission: EM | Admit: 2023-12-10 | Discharge: 2023-12-10 | Disposition: A | Discharge status: Home / Self Care | Attending: Emergency Medicine | Admitting: Emergency Medicine

## 2023-12-10 DIAGNOSIS — F419 Anxiety disorder, unspecified: Secondary | ICD-10-CM | POA: Insufficient documentation

## 2023-12-10 DIAGNOSIS — Z8541 Personal history of malignant neoplasm of cervix uteri: Secondary | ICD-10-CM | POA: Insufficient documentation

## 2023-12-10 LAB — COMPREHENSIVE METABOLIC PANEL WITH GFR
ALT: 20 U/L (ref 0–44)
AST: 23 U/L (ref 15–41)
Albumin: 4.4 g/dL (ref 3.5–5.0)
Alkaline Phosphatase: 87 U/L (ref 38–126)
Anion gap: 17 — ABNORMAL HIGH (ref 5–15)
BUN: 14 mg/dL (ref 6–20)
CO2: 18 mmol/L — ABNORMAL LOW (ref 22–32)
Calcium: 9.8 mg/dL (ref 8.9–10.3)
Chloride: 105 mmol/L (ref 98–111)
Creatinine, Ser: 0.92 mg/dL (ref 0.44–1.00)
GFR, Estimated: >60 mL/min (ref >60)
Glucose, Bld: 117 mg/dL — ABNORMAL HIGH (ref 70–99)
Potassium: 3.7 mmol/L (ref 3.5–5.1)
Sodium: 140 mmol/L (ref 135–145)
Total Bilirubin: 1.1 mg/dL (ref 0.0–1.2)
Total Protein: 8.2 g/dL — ABNORMAL HIGH (ref 6.5–8.1)

## 2023-12-10 LAB — URINALYSIS, ROUTINE W REFLEX MICROSCOPIC
Bilirubin Urine: NEGATIVE
Glucose, UA: NEGATIVE mg/dL
Hgb urine dipstick: NEGATIVE
Ketones, ur: 5 mg/dL — AB
Leukocytes,Ua: NEGATIVE
Nitrite: NEGATIVE
Protein, ur: NEGATIVE mg/dL
Specific Gravity, Urine: 1.006 (ref 1.005–1.030)
pH: 8 (ref 5.0–8.0)

## 2023-12-10 LAB — CBC
HCT: 42.8 % (ref 36.0–46.0)
Hemoglobin: 14.9 g/dL (ref 12.0–15.0)
MCH: 31.4 pg (ref 26.0–34.0)
MCHC: 34.8 g/dL (ref 30.0–36.0)
MCV: 90.1 fL (ref 80.0–100.0)
Platelets: 281 K/uL (ref 150–400)
RBC: 4.75 MIL/uL (ref 3.87–5.11)
RDW: 12 % (ref 11.5–15.5)
WBC: 5.8 K/uL (ref 4.0–10.5)
nRBC: 0 % (ref 0.0–0.2)

## 2023-12-10 LAB — BRAIN NATRIURETIC PEPTIDE: B Natriuretic Peptide: 11.5 pg/mL (ref 0.0–100.0)

## 2023-12-10 LAB — D-DIMER, QUANTITATIVE: D-Dimer, Quant: <0.27 ug{FEU}/mL (ref 0.00–0.50)

## 2023-12-10 LAB — TSH: TSH: 1.933 u[IU]/mL (ref 0.350–4.500)

## 2023-12-10 LAB — TROPONIN I (HIGH SENSITIVITY): Troponin I (High Sensitivity): 4 ng/L (ref <18)

## 2023-12-10 MED ORDER — DIAZEPAM 5 MG PO TABS
5.0000 mg | ORAL_TABLET | Freq: Two times a day (BID) | ORAL | 0 refills | Status: DC | PRN
  Filled 2023-12-10: qty 5, 3d supply, fill #0

## 2023-12-10 MED ORDER — LORAZEPAM 1 MG PO TABS
1.0000 mg | ORAL_TABLET | Freq: Once | ORAL | Status: AC
  Administered 2023-12-10: 1 mg via ORAL
  Filled 2023-12-10: qty 1

## 2023-12-10 MED ORDER — DIAZEPAM 5 MG PO TABS: 5.0000 mg | ORAL_TABLET | Freq: Two times a day (BID) | ORAL | 0 refills | Status: AC | PRN

## 2023-12-10 NOTE — ED Triage Notes (Signed)
 Pt came in via POV d/t feeling very anxious, not able to sleep more than 3-4 hrs at a time at night. Has had decreased appetite, feeling nauseous & very lethargic. Endorses her Rt arm intermittently feels numb since yesterday & sometimes feels SOB & some chest heaviness. A/Ox4, denies current pain.

## 2023-12-10 NOTE — ED Provider Notes (Signed)
 [place] [facility]ERG[facility] DEPARTMENT AT [facility] Provider Note   CSN: [id number] Arrival date & time: 12/10/23  1057     Patient presents with: Shortness of Breath and Anxiety   Joshua Ramirez-Flores is a 53 y.o. female who presents with complaints of increased anxiety over the past month.  Not associated with chest pain and shortness of breath started yesterday.  Symptoms appear to be worse on exertion.  She has no cardiac history or prior blood clots.  She has no URI symptoms.  She additionally notes that her thumb index and ring fingers will occasionally have numbness and tingling over the past 4 months.  Not associated with unilateral weakness, difficulty speaking or ambulating, vision changes, dizziness or new headaches.    Shortness of Breath Anxiety Associated symptoms include shortness of breath.      Past Medical History:  Diagnosis Date   Allergy    Cancer (HCC)    cervical cancer dx.    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 Authorizing Provider  linaclotide  (LINZESS ) 290 MCG CAPS capsule Take 1 capsule (290 mcg total) by mouth daily before breakfast. 06/29/23   Delbert Clam, MD  methocarbamol  (ROBAXIN ) 500 MG tablet Take 2 tablets (1,000 mg total) by mouth every 8 (eight) hours as needed. 09/28/23   Danton Jon HERO, PA-C  naproxen  (NAPROSYN ) 500 MG tablet Take 1 tablet (500 mg total) by mouth 2 (two) times daily with a meal. 09/28/23   McClung, Jon HERO, PA-C  sodium chloride  (OCEAN) 0.65 % SOLN nasal spray Place 1 spray into both nostrils as needed for congestion. 05/20/23 06/19/23  Mannie Pac T, DO    Allergies: Contrast media [iodinated contrast media]    Review of Systems  Respiratory:   Positive for shortness of breath.     Updated Vital Signs BP 131/67   Pulse 72   Temp 99 F (37.2 C)   Resp 17   Ht 4' 11 (1.499 m)   Wt 49.9 kg   LMP 07/01/2015   SpO2 99%   BMI 22.22 kg/m   Physical Exam Vitals and nursing note reviewed.  Constitutional:      General: She is not in acute distress.    Appearance: She is well-developed.  HENT:     Head: Normocephalic and atraumatic.  Eyes:     Conjunctiva/sclera: Conjunctivae normal.  Cardiovascular:     Rate and Rhythm: Normal rate and regular rhythm.     Heart sounds: No murmur heard. Pulmonary:     Effort: Pulmonary effort is normal. No respiratory distress.     Breath sounds: Normal breath sounds.  Abdominal:     Palpations: Abdomen is soft.     Tenderness: There is no abdominal tenderness.  Musculoskeletal:        General: No swelling.     Cervical back: Neck supple.  Skin:    General: Skin is warm and dry.     Capillary Refill: Capillary refill takes less than 2 seconds.  Neurological:     Mental Status: She is alert.     Comments: Patient is alert and oriented. There is no abnormal phonation. Symmetric smile  without facial droop. Moves all extremities spontaneously. 5/5 strength in upper and lower extremities. . No sensation deficit. There is no nystagmus. EOMI, PERRL. Coordination intact with finger to nose and normal ambulation.    Psychiatric:        Mood and Affect: Mood normal.     (all labs ordered are listed, but only abnormal results are displayed) Labs Reviewed  COMPREHENSIVE METABOLIC PANEL WITH GFR - Abnormal; Notable for the following components:      Result Value   CO2 18 (*)    Glucose, Bld 117 (*)    Total Protein 8.2 (*)    Anion gap 17 (*)    All other components within normal limits  URINALYSIS, ROUTINE W REFLEX MICROSCOPIC - Abnormal; Notable for the following components:   Color, Urine STRAW (*)    Ketones, ur 5 (*)    All other components within normal limits  CBC  BRAIN  NATRIURETIC PEPTIDE  D-DIMER, QUANTITATIVE  TSH  TROPONIN I (HIGH SENSITIVITY)  TROPONIN I (HIGH SENSITIVITY)    EKG: EKG Interpretation Date/Time:  Saturday December 10 2023 11:12:52 EDT Ventricular Rate:  84 PR Interval:  126 QRS Duration:  78 QT Interval:  374 QTC Calculation: 441 R Axis:   86  Text Interpretation: Normal sinus rhythm Nonspecific ST abnormality Abnormal ECG When compared with ECG of 26-Mar-2022 14:06, PREVIOUS ECG IS PRESENT when compared to prior, similar appearace no STEMI Confirmed by Ginger Barefoot ([id number]) on 12/10/2023 1:24:11 PM  Radiology: [facility] Chest 2 View Result Date: 12/10/2023 CLINICAL DATA:  Shortness of breath EXAM: CHEST - 2 VIEW COMPARISON:  03/04/2017 FINDINGS: The heart size and mediastinal contours are within normal limits. Both lungs are clear. The visualized skeletal structures are unremarkable. IMPRESSION: No active cardiopulmonary disease. Electronically Signed   By: Waddell Calk M.D.   On: 12/10/2023 12:32     Procedures   Medications Ordered in the ED  LORazepam  (ATIVAN ) tablet 1 mg (1 mg Oral Given 12/10/23 1228)                                    Medical Decision Making Amount and/or Complexity of Data Reviewed Labs: ordered. Radiology: ordered.   This patient presents to the ED with chief complaint(s) of anxiety, chest pain and shortness of breath.  The complaint involves an extensive differential diagnosis and also carries with it a high risk of complications and morbidity.   Pertinent past medical history as listed in HPI  The differential diagnosis includes  ACS, PE, aortic dissection, pneumothorax, anxiety, hyperthyroidism, CVA, TIA, peripheral neuropathy Additional history obtained: Additional history obtained from family Records reviewed Care Everywhere/External Records  Assessment and management:   Hemodynamically stable, nontoxic-appearing patient presented with complaints of new anxiety x 1 month, with chest pain and  shortness of breath started in the past couple days.  She has no cardiac history, respiratory issues or prior blood clots.  She has no URI symptoms.  Her lung sounds are clear, she is not requiring oxygen.  She does endorse intermittent numbness and tingling in her right hand involving her thumb, index and ring finger.  This been ongoing for the past 4 months.  Not associated with any vision changes, new headaches, weakness.  She has no neurodeficits on exam.  Lower suspicion for central process, most concerning for peripheral neuropathy.  Will obtain routine labs.  Workup overall reassuring.  Patient notes that  she has had increased recent stressors.  Discussed plan to follow-up with PCP.  Given significant difficulty sleeping over the past few days, will provide a few tablets of benzo to assist with acute anxiety until she follows with her primary care.  Independent ECG interpretation:  Normal sinus rhythm, nonspecific ST abnormality  Independent labs interpretation:  The following labs were independently interpreted:  CBC unremarkable, CMP with mild elevated anion gap of 17, UA without significant abnormality, UA with 5 ketones, negative nitrates no real leukocytes, troponin without elevation, BNP within normal limits, TSH within normal limits, D-dimer without elevation  Independent visualization and interpretation of imaging: I independently visualized the following imaging with scope of interpretation limited to determining acute life threatening conditions related to emergency care: CXR no active cardiopulmonary disease   Consultations obtained:   none  Disposition:   Patient will be discharged home. The patient has been appropriately medically screened and/or stabilized in the ED. I have low suspicion for any other emergent medical condition which would require further screening, evaluation or treatment in the ED or require inpatient management. At time of discharge the patient is  hemodynamically stable and in no acute distress. I have discussed work-up results and diagnosis with patient and answered all questions. Patient is agreeable with discharge plan. We discussed strict return precautions for returning to the emergency department and they verbalized understanding.     Social Determinants of Health:   none  This note was dictated with voice recognition software.  Despite best efforts at proofreading, errors may have occurred which can change the documentation meaning.       Final diagnoses:  Anxiety    ED Discharge Orders     None          Donnajean Lynwood VEAR DEVONNA 12/10/23 1446    Tegeler, Lonni PARAS, MD 12/10/23 2039

## 2023-12-10 NOTE — Discharge Instructions (Signed)
 You were evaluated in the emergency room for anxiety, chest pain and shortness of breath.  Your lab work and imaging did not show any significant abnormality.  If your symptoms persist please follow-up with your primary care doctor.  If you experience any new or worsening symptoms please return the emergency room.  Le evaluaron en urgencias por ansiedad, dolor torcico y dificultad para Industrial/product designer. Sus anlisis de laboratorio y estudios de imagen no mostraron ninguna anomala significativa. Si los sntomas persisten, consulte con su mdico de cabecera. Si experimenta sntomas nuevos o que empeoran, regrese a urgencias.

## 2023-12-10 NOTE — ED Notes (Signed)
 Patient transported to X-ray

## 2023-12-12 ENCOUNTER — Ambulatory Visit (HOSPITAL_COMMUNITY)
Admission: EM | Admit: 2023-12-12 | Discharge: 2023-12-12 | Disposition: A | Discharge status: Home / Self Care | Attending: Psychiatry | Admitting: Psychiatry

## 2023-12-12 ENCOUNTER — Ambulatory Visit: Payer: Self-pay

## 2023-12-12 ENCOUNTER — Other Ambulatory Visit: Payer: Self-pay

## 2023-12-12 DIAGNOSIS — F430 Acute stress reaction: Secondary | ICD-10-CM | POA: Insufficient documentation

## 2023-12-12 DIAGNOSIS — F4323 Adjustment disorder with mixed anxiety and depressed mood: Secondary | ICD-10-CM | POA: Insufficient documentation

## 2023-12-12 MED ORDER — HYDROXYZINE HCL 25 MG PO TABS: 25.0000 mg | ORAL_TABLET | Freq: Three times a day (TID) | ORAL | 0 refills | Status: AC | PRN

## 2023-12-12 MED ORDER — MIRTAZAPINE 15 MG PO TBDP: 15.0000 mg | ORAL_TABLET | Freq: Every day | ORAL | 0 refills | Status: DC

## 2023-12-12 NOTE — Progress Notes (Signed)
   12/12/23 1517  [facility] Triage Screening (Walk-ins at [facility] only)  How Did You Hear About Us ? Self  What Is the Reason for Your Visit/Call Today? Pt is a 53 yo female who presents to [facility] voluntarily accompanied by her daughter. Pt reports that she has been struggling with depressed for the past 2 monhts. Pt reports that she has been anxious as well due to her mother being sick in [place]. Pt reports that she wants to go to [place] but she is scared that she is not able to re-enter [place] . Pt reports that she does not want to be without her daughters. Pt was given 5 pills on Satuday for anxiety but is seeking outpatient services. Pt denies SI, HI , and AVH. Pt denies etoh and drug use.  How Long Has This Been Causing You Problems? 1-6 months  Have You Recently Had Any Thoughts About Hurting Yourself? No  Are You Planning to Commit Suicide/Harm Yourself At This time? No  Have you Recently Had Thoughts About Hurting Someone Sherral? No  Are You Planning To Harm Someone At This Time? No  Physical Abuse Denies  Verbal Abuse Denies  Sexual Abuse Denies  Exploitation of patient/patient's resources Denies  Self-Neglect Denies  Possible abuse reported to:  (n/a)  Are you currently experiencing any auditory, visual or other hallucinations? No  Have You Used Any Alcohol or Drugs in the Past 24 Hours? No  Do you have any current medical co-morbidities that require immediate attention? No  Clinician description of patient physical appearance/behavior: Pt was depressed with a flat affect. Pt is cooperative.  What Do You Feel Would Help You the Most Today? Treatment for Depression or other mood problem;Stress Management;Medication(s)  If access to [facility] was not available, would you have sought care in the Emergency Department? No  Determination of Need Routine (7 days)  Options For Referral Medication Management;Outpatient Therapy    Flowsheet Row ED from 12/12/2023 in [facility] ED from 12/10/2023 in [facility] Emergency Department at [facility] ED from 05/20/2023 in [facility] Emergency Department at [facility]  C-SSRS RISK CATEGORY Low Risk No Risk No Risk

## 2023-12-12 NOTE — Telephone Encounter (Signed)
 FYI Only or Action Required?: Action required by provider: clinical question for provider.  Patient was last seen in primary care on 09/28/2023 by Danton Jon HERO, PA-C.  Called Nurse Triage reporting No chief complaint on file..  Symptoms began several weeks ago.  Interventions attempted: Nothing.  Symptoms are: gradually worsening.  Triage Disposition: See PCP When Office is Open (Within 3 Days)  Patient/caregiver understands and will follow disposition?: Yes  Copied from CRM [id number]. Topic: Clinical - Red Word Triage >> Dec 12, 2023 10:16 AM Gustabo D wrote: Patient has been feeling very anxious and can't sleep. And she's been crying a lot, and feeling scared at night. She went to ED and was told to f/u pcp . 3 Call back at- [phone] Reason for Disposition  MODERATE anxiety (e.g., persistent or frequent anxiety symptoms; interferes with sleep, school, or work)  Answer Assessment - Initial Assessment Questions 1. CONCERN: Did anything happen that prompted you to call today?      Follow up appointment needed for recent ED Visit, Crying at night, Restless  2. ANXIETY SYMPTOMS: Can you describe how you (your loved one; patient) have been feeling? (e.g., tense, restless, panicky, anxious, keyed up, overwhelmed, sense of impending doom).      Restless, Crying at night, Fearful  3. ONSET: How long have you been feeling this way? (e.g., hours, days, weeks)     2 Weeks  4. SEVERITY: How would you rate the level of anxiety? (e.g., 0 - 10; or mild, moderate, severe).     Moderate  5. FUNCTIONAL IMPAIRMENT: How have these feelings affected your ability to do daily activities? Have you had more difficulty than usual doing your normal daily activities? (e.g., getting better, same, worse; self-care, school, work, interactions)     Impairing sleep function, and daily activities  6. HISTORY: Have you felt this way before? Have you ever been diagnosed with an anxiety problem  in the past? (e.g., generalized anxiety disorder, panic attacks, PTSD). If Yes, ask: How was this problem treated? (e.g., medicines, counseling, etc.)     No history of this behavior.  7. RISK OF HARM - SUICIDAL IDEATION: Do you ever have thoughts of hurting or killing yourself? If Yes, ask:  Do you have these feelings now? Do you have a plan on how you would do this?     No  8. TREATMENT:  What has been done so far to treat this anxiety? (e.g., medicines, relaxation strategies). What has helped?     Relaxation strategies, nothing seems to help  9. THERAPIST: Do you have a counselor or therapist? If Yes, ask: What is their name?     No  10. POTENTIAL TRIGGERS: Do you drink caffeinated beverages (e.g., coffee, colas, teas), and how much daily? Do you drink alcohol or use any drugs? Have you started any new medicines recently?       Linzess  is the only new medicine  11. PATIENT SUPPORT: Who is with you now? Who do you live with? Do you have family or friends who you can talk to?        Lives with daughter  46. OTHER SYMPTOMS: Do you have any other symptoms? (e.g., feeling depressed, trouble concentrating, trouble sleeping, trouble breathing, palpitations or fast heartbeat, chest pain, sweating, nausea, or diarrhea)       Trouble sleeping, trouble breathing, nausea  13. PREGNANCY: Is there any chance you are pregnant? When was your last menstrual period?       No  and No  Protocols used: Anxiety and Panic Attack-A-AH

## 2023-12-12 NOTE — Telephone Encounter (Signed)
 Call to patient. Patient's daughter answered the phone. Patient does not have a current  DPR. Last noted one was in 2017.  Daughters states mother is there with her. Mother is working right now. Advised the her should go to [facility] center for the symptoms her having . Contact information given to patient daughter. Daughter voice she would take her mother when they finished work.

## 2023-12-12 NOTE — ED Provider Notes (Signed)
 Behavioral Health Urgent Care Medical Screening Exam  Patient Name: Crystal [name] MRN: [id number] Date of Evaluation: 12/12/23 Chief Complaint:  For one month I have been so depressed and anxious Diagnosis:  Final diagnoses:  Adjustment disorder with mixed anxiety and depressed mood  Situational crisis    History of Present illness: Crystal [name] is a 53 y.o. female without significant mental health history.   Patient presented to [facility] as a walk in, voluntarily, accompanied by daughter, with complaints of worsening panic, insomnia,   Crystal [name], 53 y.o., female patient seen face to face by this provider, consulted with Dr. Leigh; and chart reviewed on 12/12/23.    On evaluation Crystal [name] reports   During evaluation Crystal [name] is sitting upright (position) in no acute distress.  She  is alert/oriented x 4; calm/cooperative; and mood congruent with affect.  ***He/She is speaking in a clear tone at moderate volume, and normal pace; with good eye contact.  ***His/Her thought process is coherent and relevant; There is no indication that ***he/she is currently responding to internal/external stimuli or experiencing delusional thought content; and ***he/she has denied suicidal/self-harm/homicidal ideation, psychosis, and paranoia.   Patient has remained calm throughout assessment and has answered questions appropriately.     At this time Crystal [name] is educated and verbalizes understanding of mental health resources and other crisis services in the community. ***HeShe is instructed to call 911 and present to the nearest emergency room should ***he/she experience any suicidal/homicidal ideation, auditory/visual/hallucinations, or detrimental worsening of ***his/her mental health condition.  ***He/She was a also advised by Clinical research associate that ***he/she could call the toll-free phone on insurance card to assist with  identifying in network counselors and agencies or number on back of Medicaid card t speak with care coordinator    Flowsheet Row ED from 12/12/2023 in [facility] ED from 12/10/2023 in [facility] Emergency Department at [facility] ED from 05/20/2023 in [facility] Emergency Department at [facility]  C-SSRS RISK CATEGORY Low Risk No Risk No Risk    Psychiatric Specialty Exam  Presentation  General Appearance:Appropriate for Environment  Eye Contact:Fleeting  Speech:Clear and Coherent  Speech Volume:Decreased  Handedness:Right   Mood and Affect  Mood:Depressed  Affect:Congruent   Thought Process  Thought Processes:Coherent  Descriptions of Associations:Circumstantial  Orientation:Full (Time, Place and Person)  Thought Content:WDL    Hallucinations:None  Ideas of Reference:None  Suicidal Thoughts:No  Homicidal Thoughts:No   Sensorium  Memory:Immediate Good; Recent Fair; Remote Fair  Judgment:Fair  Insight:Fair   Executive Functions  Concentration:Fair  Attention Span:Fair  Recall:Fair  Fund of Knowledge:Fair  Language:Fair   Psychomotor Activity  Psychomotor Activity:Normal   Assets  Assets:Communication Skills   Sleep  Sleep:Fair  Number of hours: No data recorded  Physical Exam: Physical Exam ROS Blood pressure 110/61, pulse 80, temperature (!) 97.5 F (36.4 C), temperature source Oral, resp. rate 17, last menstrual period 07/01/2015, SpO2 95%. There is no height or weight on file to calculate BMI.  Musculoskeletal: Strength & Muscle Tone: within normal limits Gait & Station: normal Patient leans: N/A   [facility] MSE Discharge Disposition for Follow up and Recommendations: {[facility] MSE Recommendations:24277}   Suzen Lesches, NP 12/12/2023, 5:04 PM

## 2023-12-12 NOTE — Discharge Instructions (Addendum)
 Start Remeron  15 mg daily at bedtime this medication can help with depressive symptoms, anxiety and panic, and is used off label to help improve sleep and appetite. Hydroxyzine  25 mg 3 times daily can be taking for symptoms of anxiousness and panic.  This medication is only to be taken as needed for anxiousness or restlessness.  Medication can cause severe drowsiness avoid taking if driving or needing to be fully engaged in an activity.  Discussed with your primary care doctor next week the effects of these medications and these can be continued by your primary care doctor.  I have attached resources for you to follow-up here at [facility] for outpatient therapy.  As discussed you must arrive by 645 to sign up for open access appointments which occur daily Monday through Friday except for holidays on a first come first serve basis.  We asked that you arrive here by 645 as there is limited slots available all future and subsequent appointments after this initial appointment will be scheduled.   [facility] is an [employer] for mental health services.    Same-day appointments Operating hours and clinician availability may delay appointments until the next business day. [place]  New and Current Patients  Psychiatry hours Monday-Friday, 8am-4:30pm (Eastern Time) If you are experiencing problems accessing [facility] send email to: [email or web address] or call [phone], Monday-Friday, 8:00am-4:30pm If you are having a psychiatric or medical emergency, please call 911 or go to the nearest emergency department. To reach the Suicide and Crisis Lifeline, please call or text [phone].  What is [facility]?  [facility] is an online service that provides same-day access to psychiatry to meet urgent mental health needs. The goal is to provide patients with timely intervention and keep them in an outpatient setting.  How long will I wait to see a clinician?  Our goal  is to provide same-day care. However, operating hours and clinician availability may delay appointments until the next business day.  What if I can't get an appointment?  Please call [facility] at [phone] 8am to 5pm [place] Time or email us :  [email or web address]  to request the next available time. If you are having a psychiatric or medical emergency, please call 911 or go to the nearest emergency department. To reach the Suicide and Crisis Lifeline, please call or text [phone]. Do you accept insurance?  Yes. We accept most commercial insurance plans. What information do you need from me? New patients should be ready to provide:  Photo ID  Insurance card  Payment information  For current patients, our specialists will confirm your documentation is on file.   Can I continue with regular care after my [facility] session?  Yes. Our goal is to make sure you have the follow-up care you need. Our specialist can help you schedule an appointment with an ongoing provider in addition to your [facility] session.  Can my current clinician provide treatment on [facility]?  No. Our [facility] clinicians are trained to assist with more immediate needs and provide support between regular appointments with your clinician.  What device can I use to connect with [facility]?  You can use any Wi-Fi-enabled device with a camera and a microphone, such as a smartphone, tablet, or computer.  Do I have to be at home to connect with [facility]?  No. As long as you are located within [place]  or [place]  you can connect with  a provider. We do request that you connect from a safe and private location. Your clinician is required to document your location for emergency purposes.

## 2023-12-20 ENCOUNTER — Telehealth (INDEPENDENT_AMBULATORY_CARE_PROVIDER_SITE_OTHER): Payer: Self-pay | Admitting: Primary Care

## 2023-12-20 NOTE — Telephone Encounter (Signed)
 Called pt to confirm appt. Ptt did not answer and LVM.

## 2023-12-21 ENCOUNTER — Ambulatory Visit (INDEPENDENT_AMBULATORY_CARE_PROVIDER_SITE_OTHER): Payer: Self-pay | Admitting: Primary Care

## 2023-12-21 ENCOUNTER — Encounter (INDEPENDENT_AMBULATORY_CARE_PROVIDER_SITE_OTHER): Payer: Self-pay | Admitting: Primary Care

## 2023-12-21 VITALS — BP 139/78 | HR 72 | Resp 16 | Ht 59.0 in | Wt 112.4 lb

## 2023-12-21 DIAGNOSIS — F322 Major depressive disorder, single episode, severe without psychotic features: Secondary | ICD-10-CM

## 2023-12-21 NOTE — Progress Notes (Signed)
 Subjective:   [name] is a 53 y.o. 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  PCP Dr. Newlin.  Admit date to the hospital on 12/12/23, for adjustment disorder with mixed anxiety depressed mood with situational crisis.  Patient was with her daughter in which she voluntarily committed herself to behavior health for worsening panic attacks and insomnia.  This was provoked by learning that her mother who lives in [place] health was declining.  Patient had plans on going to visit her mother flying out tomorrow but she is not emotionally prepared for the situation that may be present before her and would like to try to go at a later time when she is more emotionally stable.    Past Medical History:  Diagnosis Date   Allergy    Cancer (HCC)    cervical cancer dx.    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 5 tablet 0   hydrOXYzine  (ATARAX ) 25 MG tablet Take 1 tablet (25 mg total) by mouth 3 (three) times daily as needed for anxiety. 20 tablet 0   linaclotide  (LINZESS ) 290 MCG CAPS capsule Take 1 capsule (290 mcg total) by mouth daily before breakfast. 90 capsule 1   methocarbamol  (ROBAXIN ) 500 MG tablet Take 2 tablets (1,000 mg total) by mouth every 8 (eight) hours as needed. 90 tablet 0   mirtazapine  (REMERON  SOL-TAB) 15 MG disintegrating tablet Take 1 tablet (15 mg total) by mouth at bedtime for 15 days. 15  tablet 0   naproxen  (NAPROSYN ) 500 MG tablet Take 1 tablet (500 mg total) by mouth 2 (two) times daily with a meal. 60 tablet 1   sodium chloride  (OCEAN) 0.65 % SOLN nasal spray Place 1 spray into both nostrils as needed for congestion. 30 mL 0   No current facility-administered medications on file prior to visit.    Review of System: ROS Comprehensive ROS Pertinent positive and negative noted in HPI   Objective:  BP 139/78   Pulse 72   Resp 16   Ht 4' 11 (1.499 m)   Wt 112 lb 6.4 oz (51 kg)   LMP 07/01/2015   SpO2 98%   BMI 22.70 kg/m   Filed Weights   12/21/23 1522  Weight: 112 lb 6.4 oz (51 kg)    Physical Exam Vitals reviewed.  Constitutional:      Appearance: Normal appearance.  HENT:     Head: Normocephalic.     Right Ear: Tympanic membrane, ear canal and external ear normal.     Left Ear: Tympanic membrane, ear canal and external ear normal.     Nose: Nose normal.     Mouth/Throat:     Mouth: Mucous membranes are moist.  Eyes:     Extraocular Movements: Extraocular movements intact.     Pupils: Pupils  are equal, round, and reactive to light.  Cardiovascular:     Rate and Rhythm: Normal rate and regular rhythm.  Pulmonary:     Effort: Pulmonary effort is normal.     Breath sounds: Normal breath sounds.  Abdominal:     General: Bowel sounds are normal.     Palpations: Abdomen is soft.  Musculoskeletal:        General: Normal range of motion.     Cervical back: Normal range of motion.  Skin:    General: Skin is warm and dry.  Neurological:     Mental Status: She is alert and oriented to person, place, and time.  Psychiatric:        Mood and Affect: Mood normal.        Behavior: Behavior normal.        Thought Content: Thought content normal.      Assessment:  Crystal [name] was seen today for hospitalization follow-up and fall.  Diagnoses and all orders for this visit:  Current severe episode of major depressive disorder without psychotic  features, unspecified whether recurrent (HCC) Note written.     This note has been created with Education officer, environmental. Any transcriptional errors are unintentional.   Return for Dr. Newlin s/p fall 4 days ago .  Crystal SHAUNNA Bohr, NP 12/21/2023, 4:08 PM

## 2023-12-21 NOTE — Patient Instructions (Signed)
 Tratamiento RHCE para cuidados de rutina de lesiones RICE Therapy for Routine Care of Injuries Muchas lesiones pueden tratarse con reposo, hielo, compresin y elevacin. Esta terapia tambin se denomina "tratamiento RHCE". El tratamiento RHCE incluye lo siguiente: Descansar la zona del cuerpo lesionada. Aplicar hielo sobre la lesin. Ejercer presin sobre la lesin. Esto tambin se denomina "compresin". Levantar la parte lesionada. Esto tambin se denomina "elevacin". El tratamiento RHCE puede ayudar a Teacher, early years/pre y la hinchazn. Materiales necesarios: Hielo. Bolsa plstica. Toalla. Vendaje elstico. Ignacia o varias almohadas para mantener en alto la parte lesionada del cuerpo. Cmo cuidar la lesin con la terapia RHCE Reposo Intente descansar la parte del cuerpo lesionada. Puede retomar sus actividades normales cuando el mdico le autorice hacerlas y cuando pueda Journalist, newspaper sin Engineer, mining. Pregunte qu cosas son seguras para que haga. Algunas lesiones se curan mejor con el movimiento temprano en lugar del reposo. Si deja reposar demasiado la zona de la lesin, es posible que no se cure bien. Pregntele al mdico si debe realizar ejercicios para ayudar a que la lesin mejore. Hielo Aplicar hielo en la lesin puede ayudar a reducir la hinchazn y Chief Technology Officer. No aplique el hielo directamente sobre la piel. Use hielo tantos [employer] se lo haya indicado el mdico. Si se lo indican, aplique hielo sobre la zona. Ponga el hielo en una bolsa plstica. Coloque una toalla entre la piel y la bolsa. Aplique el hielo durante 20 minutos, 2 o 3 veces por da. Si la piel se le pone de color rojo brillante, quite el hielo de inmediato para evitar daos en la piel. El Palco de dao es mayor si no puede sentir dolor, Airline pilot o fro.  Compresin Ejerza presin, o "compresin", sobre la zona lesionada. Esto se puede realizar con IT consultant. Si le colocaron este tipo de venda en la lesin: Siga las  instrucciones del paquete donde vena la venda para saber cmo usarla. No ajuste demasiado la venda. Afloje la venda si alguna parte del cuerpo cerca de la venda se torna de color azul, est hinchada, se enfra o le causa dolor, o si pierde la sensibilidad en esa zona. Qutese la venda y vuelva a ponrsela cada 3 o 4 horas o como se lo haya indicado el mdico. Consulte al mdico si la venda parece empeorar la lesin.  Elevacin Cuando est sentado o acostado, mantenga la zona lesionada por encima del nivel del corazn. Use una almohada para sostener la zona lesionada segn sea necesario. Siga estas indicaciones en su casa: Si sus sntomas empeoran o duran [name], programe una cita de seguimiento con el mdico. Tambin pueden tener que hacerle pruebas de diagnstico por imgenes, como radiografas o una resonancia magntica (RM). Si le hacen pruebas de diagnstico por imgenes, pregunte cmo obtener sus resultados cuando estn listos. Comunquese con un mdico si: El dolor y la hinchazn continan. Sus sntomas empeoran. Solicite ayuda de inmediato si: Siente un dolor muy intenso y repentino en la zona de la lesin o por debajo de ella. Siente hormigueo o entumecimiento en la zona de la lesin o por debajo de ella, que no desaparece despus de quitarse la venda. Esta informacin no tiene Theme park manager el consejo del mdico. Asegrese de hacerle al mdico cualquier pregunta que tenga. Document Revised: 09/08/2022 Document Reviewed: 09/08/2022 Elsevier Patient Education  2024 [employer].

## 2023-12-27 ENCOUNTER — Other Ambulatory Visit (HOSPITAL_COMMUNITY): Payer: Self-pay

## 2023-12-27 ENCOUNTER — Encounter: Payer: Self-pay | Admitting: Plastic Surgery

## 2023-12-27 ENCOUNTER — Ambulatory Visit (INDEPENDENT_AMBULATORY_CARE_PROVIDER_SITE_OTHER): Payer: Self-pay | Admitting: Plastic Surgery

## 2023-12-27 VITALS — BP 103/60 | HR 76

## 2023-12-27 DIAGNOSIS — Z719 Counseling, unspecified: Secondary | ICD-10-CM

## 2023-12-27 MED ORDER — LIDOCAINE 23% - TETRACAINE 7% TOPICAL OINTMENT (PLASTICIZED)
1.0000 | TOPICAL_OINTMENT | Freq: Once | CUTANEOUS | 0 refills | Status: AC
  Filled 2023-12-27 – 2024-01-12 (×2): qty 60, 1d supply, fill #0
  Filled 2024-02-22: qty 60, 30d supply, fill #0
  Filled 2024-02-22: qty 60, 25d supply, fill #0
  Filled 2024-02-22 (×3): qty 60, 30d supply, fill #0

## 2023-12-27 NOTE — Progress Notes (Signed)
 Patient ID: Crystal [name], female    DOB: 1970/12/26, 53 y.o.   MRN: [id number]   Chief Complaint  Patient presents with   Advice Only    The patient is a very nice 53 year old female here for evaluation of her face.  She is interested in reducing the pigment and wrinkles.  She is a Fitzpatrick 4 5.  She has been good about using sunblock but no other advanced creams.  She is interested in getting her face to look younger.  She does have some sun damage with pigment throughout.  She has some wrinkling's in the [place] orbital area as well as in the glabella area.  She has used Botox before but did not seem to think it helped very much.  I do not think she used it for very long.    Review of Systems  Constitutional: Negative.   Eyes: Negative.   Respiratory: Negative.    Cardiovascular: Negative.   Gastrointestinal: Negative.   Endocrine: Negative.   Genitourinary: Negative.   Musculoskeletal: Negative.     Past Medical History:  Diagnosis Date   Allergy    Cancer (HCC)    cervical cancer dx.    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   not cancer per pt   PONV (postoperative nausea and vomiting)     Past Surgical History:  Procedure Laterality Date   ABDOMINAL HYSTERECTOMY     BREAST BIOPSY Left 08/28/2015   MRI- High Risk   BREAST BIOPSY Right 08/28/2015   MRI- Benign   BREAST EXCISIONAL BIOPSY Left 10/03/2015   BREAST LUMPECTOMY WITH RADIOACTIVE SEED LOCALIZATION Left 10/03/2015   Procedure: LEFT BREAST LUMPECTOMY WITH RADIOACTIVE SEED LOCALIZATION;  Surgeon: Elon Pacini, MD;  Location: [facility];  Service: General;  Laterality: Left;   CERVICAL BIOPSY  W/ LOOP ELECTRODE EXCISION     12'16   CESAREAN SECTION     2 previous   ROBOTIC ASSISTED TOTAL HYSTERECTOMY Bilateral 07/01/2015   Procedure: XI ROBOTIC ASSISTED RADICAL  TYPE II HYSTERECTOMY, BILATERAL SALPINGECTOMY, SENTINAL LYMPH NODE BIOPSY;  Surgeon: Maurilio Ship, MD;  Location: [facility];  Service: Gynecology;  Laterality: Bilateral;      Current Outpatient Medications:    diazepam  (VALIUM ) 5 MG tablet, Take 1 tablet (5 mg total) by mouth every 12 (twelve) hours as needed for anxiety., Disp: 5 tablet, Rfl: 0   hydrOXYzine  (ATARAX ) 25 MG tablet, Take 1 tablet (25 mg total) by mouth 3 (three) times daily as needed for anxiety., Disp: 20 tablet, Rfl: 0   linaclotide  (LINZESS ) 290 MCG CAPS capsule, Take 1 capsule (290 mcg total) by mouth daily before breakfast., Disp: 90 capsule, Rfl: 1   methocarbamol  (ROBAXIN ) 500 MG tablet, Take 2 tablets (1,000 mg total) by mouth every 8 (eight) hours as needed., Disp: 90 tablet, Rfl: 0   mirtazapine  (REMERON  SOL-TAB) 15 MG disintegrating tablet, Take 1 tablet (15 mg total) by mouth at bedtime for 15 days., Disp: 15 tablet, Rfl: 0   naproxen  (NAPROSYN ) 500 MG tablet, Take 1 tablet (500 mg total) by mouth 2 (two) times daily with a meal., Disp: 60 tablet, Rfl: 1   sodium chloride  (OCEAN) 0.65 % SOLN nasal spray, Place 1 spray into both nostrils as needed for congestion. (Patient not taking: Reported on 12/27/2023), Disp: 30  mL, Rfl: 0   Objective:   Vitals:   12/27/23 1424  BP: 103/60  Pulse: 76  SpO2: 98%    Physical Exam Vitals reviewed.  Constitutional:      Appearance: Normal appearance.  HENT:     Head: Atraumatic.  Cardiovascular:     Rate and Rhythm: Normal rate.     Pulses: Normal pulses.  Pulmonary:     Effort: Pulmonary effort is normal.  Musculoskeletal:        General: No swelling or deformity.  Skin:    General: Skin is warm.     Capillary Refill: Capillary refill takes less than 2 seconds.     Coloration: Skin is not jaundiced.  Neurological:     Mental Status: She is alert and oriented to person, place, and time.  Psychiatric:        Mood and Affect: Mood normal.        Behavior: Behavior  normal.        Thought Content: Thought content normal.        Judgment: Judgment normal.     Assessment & Plan:  Encounter for counseling  The patient is a very good candidate for the BBL and the Moxi.  We could do 1 BBL in the Moxi at the same time.  I will send in the numbing medicine for her.  She is going to think it over to see if she can afford it and then the cysteamine is also a really good option for her.  Pictures were obtained of the patient and placed in the chart with the patient's or guardian's permission.   Estefana RAMAN Jini Horiuchi, DO

## 2023-12-28 ENCOUNTER — Other Ambulatory Visit (HOSPITAL_COMMUNITY): Payer: Self-pay

## 2024-01-06 ENCOUNTER — Other Ambulatory Visit (HOSPITAL_COMMUNITY): Payer: Self-pay

## 2024-01-11 ENCOUNTER — Telehealth: Payer: Self-pay | Admitting: *Deleted

## 2024-01-11 NOTE — Progress Notes (Signed)
 Complex Care Management Note  Care Guide Note 01/11/2024 Name: Crystal [name] MRN: [id number] DOB: 12/05/1970  [name] is a 53 y.o. year old female who sees Delbert Clam, MD for primary care. I reached out to [name] by phone today to offer complex care management services. Used [employer] [phone] named Angie  [name] was given information about Complex Care Management services today including:   The Complex Care Management services include support from the care team which includes your Nurse Care Manager, Clinical Social Worker, or Pharmacist.  The Complex Care Management team is here to help remove barriers to the health concerns and goals most important to you. Complex Care Management services are voluntary, and the patient may decline or stop services at any time by request to their care team member.   Complex Care Management Consent Status: Patient agreed to services and verbal consent obtained.   Follow up plan:  Telephone appointment with complex care management team member scheduled for:  01/27/24  Encounter Outcome:  Patient Scheduled  Harlene Satterfield  [facility]  [facility], [facility] Guide  Direct Dial: [phone]  Fax [phone]

## 2024-01-12 ENCOUNTER — Other Ambulatory Visit: Payer: Self-pay

## 2024-01-12 ENCOUNTER — Other Ambulatory Visit (HOSPITAL_COMMUNITY): Payer: Self-pay

## 2024-01-17 ENCOUNTER — Ambulatory Visit: Payer: Self-pay | Admitting: Family Medicine

## 2024-01-27 ENCOUNTER — Other Ambulatory Visit: Payer: Self-pay | Admitting: Licensed Clinical Social Worker

## 2024-02-08 ENCOUNTER — Telehealth: Payer: Self-pay | Admitting: *Deleted

## 2024-02-08 NOTE — Progress Notes (Unsigned)
 Complex Care Management Care Guide Note  02/08/2024 Name: Crystal [name] MRN: [id number] DOB: 11-20-1970  Crystal [name] is a 53 y.o. year old female who is a primary care patient of Newlin, Enobong, MD and is actively engaged with the care management team. I reached out to Crystal [name] by phone today to assist with re-scheduling  with the Licensed Clinical Child psychotherapist.  Follow up plan: Unsuccessful telephone outreach attempt made.  Harlene Satterfield  [facility]  [facility], [facility] Guide  Direct Dial: [phone]  Fax [phone]

## 2024-02-09 NOTE — Progress Notes (Signed)
 Complex Care Management Care Guide Note  02/09/2024 Name: Crystal [name] MRN: [id number] DOB: [birth year]  Crystal [name] is a 53 y.o. year old female who is a primary care patient of Newlin, Enobong, MD and is actively engaged with the care management team. I reached out to Crystal [name] by phone today to assist with re-scheduling  with the Licensed Clinical Child psychotherapist.  Follow up plan: Unsuccessful telephone outreach attempt made. A HIPAA compliant phone message was left for the patient providing contact information and requesting a return call.No further outreach attempts will be made at this time. We have been unable to contact the patient to reschedule for complex care management services.\  Harlene Satterfield  [facility]  [facility], [facility] Guide  Direct Dial: [phone]  Fax [phone]

## 2024-02-16 ENCOUNTER — Other Ambulatory Visit: Payer: Self-pay

## 2024-02-20 ENCOUNTER — Other Ambulatory Visit: Payer: Self-pay

## 2024-02-20 ENCOUNTER — Encounter: Payer: Self-pay | Admitting: Family Medicine

## 2024-02-20 ENCOUNTER — Ambulatory Visit: Payer: Self-pay | Attending: Family Medicine | Admitting: Family Medicine

## 2024-02-20 VITALS — BP 124/75 | HR 83 | Ht 59.0 in | Wt 114.8 lb

## 2024-02-20 DIAGNOSIS — Z79899 Other long term (current) drug therapy: Secondary | ICD-10-CM

## 2024-02-20 DIAGNOSIS — Z90722 Acquired absence of ovaries, bilateral: Secondary | ICD-10-CM

## 2024-02-20 DIAGNOSIS — K5909 Other constipation: Secondary | ICD-10-CM

## 2024-02-20 DIAGNOSIS — M549 Dorsalgia, unspecified: Secondary | ICD-10-CM

## 2024-02-20 DIAGNOSIS — Z1211 Encounter for screening for malignant neoplasm of colon: Secondary | ICD-10-CM

## 2024-02-20 DIAGNOSIS — Z8541 Personal history of malignant neoplasm of cervix uteri: Secondary | ICD-10-CM

## 2024-02-20 MED ORDER — LINACLOTIDE 290 MCG PO CAPS
290.0000 ug | ORAL_CAPSULE | Freq: Every day | ORAL | 1 refills | Status: AC
  Filled 2024-02-20 – 2024-03-16 (×2): qty 90, 90d supply, fill #0

## 2024-02-20 NOTE — Patient Instructions (Signed)

## 2024-02-20 NOTE — Progress Notes (Signed)
 Subjective:  Patient ID: Crystal [name], female    DOB: 27-Dec-1970  Age: 53 y.o. MRN: [id number]  CC: Medical Management of Chronic Issues (Abdominal pain/Back pain/Constipation)     Discussed the use of AI scribe software for clinical note transcription with the patient, who gave verbal consent to proceed.  History of Present Illness Crystal [name] is a 53 year old female with  a history of GERD, Stage 1B cervical cancer status post robotic-assisted type III radical laparoscopic hysterectomy with bilateral salpingectomy and bilateral sentinel lymph node biopsy in 06/2015 (no residual carcinoma as per GYN ) .  Constipation is managed with Linzess , which sometimes causes diarrhea and as a result she skips doses.  Anxiety also leads to skipped doses, resulting in abdominal pain and bloating. Abdominal pain is described as 'really inflamed'. She is concerned about her overdue colonoscopy, last performed in 2019 with recommendation to repeat in 5 years, and has been unable to schedule it due to lack of insurance.  Lower back pain has persisted for about six weeks, localized to the back without leg radiation. Pain worsens with sleep, bending, and daily activities. Naproxen  is used for relief.   She also has not had recent Pap smears due to her previous GYN provider leaving.    Past Medical History:  Diagnosis Date   Allergy    Cancer (HCC)    cervical cancer dx.    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   not cancer per pt   PONV (postoperative nausea and vomiting)     Past Surgical History:  Procedure Laterality Date   ABDOMINAL HYSTERECTOMY     BREAST BIOPSY Left 08/28/2015   MRI- High Risk   BREAST BIOPSY Right 08/28/2015   MRI- Benign   BREAST EXCISIONAL BIOPSY Left 10/03/2015   BREAST LUMPECTOMY WITH RADIOACTIVE SEED  LOCALIZATION Left 10/03/2015   Procedure: LEFT BREAST LUMPECTOMY WITH RADIOACTIVE SEED LOCALIZATION;  Surgeon: Elon Pacini, MD;  Location: [facility];  Service: General;  Laterality: Left;   CERVICAL BIOPSY  W/ LOOP ELECTRODE EXCISION     12'16   CESAREAN SECTION     2 previous   ROBOTIC ASSISTED TOTAL HYSTERECTOMY Bilateral 07/01/2015   Procedure: XI ROBOTIC ASSISTED RADICAL TYPE II HYSTERECTOMY, BILATERAL SALPINGECTOMY, SENTINAL LYMPH NODE BIOPSY;  Surgeon: Maurilio Ship, MD;  Location: [facility];  Service: Gynecology;  Laterality: Bilateral;    Family History  Problem Relation Age of Onset   Hypertension Sister    Atrial fibrillation Sister    Colon cancer Neg Hx    Esophageal cancer Neg Hx    Rectal cancer Neg Hx    Stomach cancer Neg Hx     Social History   Socioeconomic History   Marital status: Widowed    Spouse name: Not on file   Number of children: 2   Years of education: Not on file   Highest education level: 9th grade  Occupational History   Occupation: cleans homes  Tobacco Use   Smoking status: Never   Smokeless tobacco: Never  Vaping Use   Vaping status: Never Used  Substance and Sexual Activity   Alcohol use: Yes    Comment: occasionally   Drug use: No   Sexual activity: Not Currently    Birth control/protection: Surgical  Other Topics Concern  Not on file  Social History Narrative   Not on file   Social Drivers of Health   Financial Resource Strain: Not on file  Food Insecurity: No Food Insecurity (09/08/2023)   Hunger Vital Sign    Worried About Running Out of Food in the Last Year: Never true    Ran Out of Food in the Last Year: Never true  Transportation Needs: No Transportation Needs (09/08/2023)   PRAPARE - Administrator, Civil Service (Medical): No    Lack of Transportation (Non-Medical): No  Physical Activity: Not on file  Stress: Not on file  Social Connections: Not on file    Allergies  Allergen Reactions    Contrast Media [Iodinated Contrast Media] Itching    13 hr prep in the future; itching of tongue and around the mouth 03/02/2019    Outpatient Medications Prior to Visit  Medication Sig Dispense Refill   hydrOXYzine  (ATARAX ) 25 MG tablet Take 1 tablet (25 mg total) by mouth 3 (three) times daily as needed for anxiety. 20 tablet 0   naproxen  (NAPROSYN ) 500 MG tablet Take 1 tablet (500 mg total) by mouth 2 (two) times daily with a meal. 60 tablet 1   diazepam  (VALIUM ) 5 MG tablet Take 1 tablet (5 mg total) by mouth every 12 (twelve) hours as needed for anxiety. (Patient not taking: Reported on 02/20/2024) 5 tablet 0   methocarbamol  (ROBAXIN ) 500 MG tablet Take 2 tablets (1,000 mg total) by mouth every 8 (eight) hours as needed. (Patient not taking: Reported on 02/20/2024) 90 tablet 0   mirtazapine  (REMERON  SOL-TAB) 15 MG disintegrating tablet Take 1 tablet (15 mg total) by mouth at bedtime for 15 days. (Patient not taking: Reported on 02/20/2024) 15 tablet 0   sodium chloride  (OCEAN) 0.65 % SOLN nasal spray Place 1 spray into both nostrils as needed for congestion. (Patient not taking: Reported on 02/20/2024) 30 mL 0   linaclotide  (LINZESS ) 290 MCG CAPS capsule Take 1 capsule (290 mcg total) by mouth daily before breakfast. (Patient not taking: Reported on 02/20/2024) 90 capsule 1   No facility-administered medications prior to visit.     ROS Review of Systems  Constitutional:  Negative for activity change and appetite change.  HENT:  Negative for sinus pressure and sore throat.   Respiratory:  Negative for chest tightness, shortness of breath and wheezing.   Cardiovascular:  Negative for chest pain and palpitations.  Gastrointestinal:  Positive for abdominal pain. Negative for abdominal distention and constipation.  Genitourinary: Negative.   Musculoskeletal:        See Hpi  Psychiatric/Behavioral:  Negative for behavioral problems and dysphoric mood.     Objective:  BP 124/75   Pulse 83    Ht 4' 11 (1.499 m)   Wt 114 lb 12.8 oz (52.1 kg)   LMP 07/01/2015   SpO2 99%   BMI 23.19 kg/m      02/20/2024    4:17 PM 12/27/2023    2:24 PM 12/21/2023    3:22 PM  BP/Weight  Systolic BP 124 103 139  Diastolic BP 75 60 78  Wt. (Lbs) 114.8  112.4  BMI 23.19 kg/m2  22.7 kg/m2      Physical Exam Constitutional:      Appearance: She is well-developed.  Cardiovascular:     Rate and Rhythm: Normal rate.     Heart sounds: Normal heart sounds. No murmur heard. Pulmonary:     Effort: Pulmonary effort is normal.  Breath sounds: Normal breath sounds. No wheezing or rales.  Chest:     Chest wall: No tenderness.  Abdominal:     General: Bowel sounds are normal. There is no distension.     Palpations: Abdomen is soft. There is no mass.     Tenderness: There is no abdominal tenderness.  Musculoskeletal:        General: Normal range of motion.     Right lower leg: No edema.     Left lower leg: No edema.  Neurological:     Mental Status: She is alert and oriented to person, place, and time.  Psychiatric:        Mood and Affect: Mood normal.        Latest Ref Rng & Units 12/10/2023   11:10 AM 05/20/2023    1:56 PM 12/28/2022    8:51 AM  CMP  Glucose 70 - 99 mg/dL 882  894  896   BUN 6 - 20 mg/dL 14  16  17    Creatinine 0.44 - 1.00 mg/dL 9.07  9.27  9.21   Sodium 135 - 145 mmol/L 140  135  139   Potassium 3.5 - 5.1 mmol/L 3.7  4.5  4.4   Chloride 98 - 111 mmol/L 105  105  104   CO2 22 - 32 mmol/L 18  21  19    Calcium 8.9 - 10.3 mg/dL 9.8  9.2  9.6   Total Protein 6.5 - 8.1 g/dL 8.2   7.8   Total Bilirubin 0.0 - 1.2 mg/dL 1.1   0.4   Alkaline Phos 38 - 126 U/L 87   119   AST 15 - 41 U/L 23   20   ALT 0 - 44 U/L 20   26     Lipid Panel     Component Value Date/Time   CHOL 229 (H) 12/28/2022 0851   TRIG 114 12/28/2022 0851   HDL 62 12/28/2022 0851   CHOLHDL 3.2 11/03/2015 1017   VLDL 19 11/03/2015 1017   LDLCALC 147 (H) 12/28/2022 0851    CBC    Component  Value Date/Time   WBC 5.8 12/10/2023 1110   RBC 4.75 12/10/2023 1110   HGB 14.9 12/10/2023 1110   HGB 15.4 12/28/2022 0851   HGB 15.0 07/08/2015 1441   HCT 42.8 12/10/2023 1110   HCT 45.5 12/28/2022 0851   HCT 45.5 07/08/2015 1441   PLT 281 12/10/2023 1110   PLT 264 12/28/2022 0851   MCV 90.1 12/10/2023 1110   MCV 95 12/28/2022 0851   MCV 96.1 07/08/2015 1441   MCH 31.4 12/10/2023 1110   MCHC 34.8 12/10/2023 1110   RDW 12.0 12/10/2023 1110   RDW 11.8 12/28/2022 0851   RDW 12.4 07/08/2015 1441   LYMPHSABS 2.0 05/20/2023 1356   LYMPHSABS 1.6 12/28/2022 0851   LYMPHSABS 1.1 07/08/2015 1441   MONOABS 0.5 05/20/2023 1356   MONOABS 0.6 07/08/2015 1441   EOSABS 0.1 05/20/2023 1356   EOSABS 0.1 12/28/2022 0851   BASOSABS 0.0 05/20/2023 1356   BASOSABS 0.0 12/28/2022 0851   BASOSABS 0.1 07/08/2015 1441    Lab Results  Component Value Date   HGBA1C 5.8 (H) 12/28/2022       Assessment & Plan Constipation with abdominal pain and distension Intermittent constipation with abdominal pain and distension, worsened by inconsistent Linzess  use due to anxiety. Symptoms improve with regular bowel movements. - Instructed to take Linzess  daily to manage symptoms. - Discussed importance of regular bowel  movements. - Ordered CT scan of the abdomen without contrast due to contrast allergy.  Low back pain Persistent low back pain for 1.5 months, non-radiating, worsens with activity and sleep, relieved by naproxen . - Ordered X-ray of the lumbar spine to evaluate for arthritis or other causes.  History of stage 1 cervical cancer Stage 1 cervical cancer status post TAH/BSO with overdue Pap smear and no current GYN follow-up. - Scheduled Pap smear with current provider. - Referred to GYN for further management and follow-up.   Healthcare maintenance Screening for colon cancer - referred for colonoscopy as she is overdue  Meds ordered this encounter  Medications   linaclotide  (LINZESS )  290 MCG CAPS capsule    Sig: Take 1 capsule (290 mcg total) by mouth daily before breakfast.    Dispense:  90 capsule    Refill:  1    Follow-up: Return in about 1 month (around 03/21/2024) for Pap smear.  She will need a vaginal Pap smear due to hysterectomy status     Corrina Sabin, MD, FAAFP. [facility] [place], [place] [phone]   02/20/2024, 5:27 PM

## 2024-02-22 ENCOUNTER — Other Ambulatory Visit (HOSPITAL_COMMUNITY): Payer: Self-pay

## 2024-02-22 ENCOUNTER — Other Ambulatory Visit: Payer: Self-pay

## 2024-02-23 ENCOUNTER — Other Ambulatory Visit: Payer: Self-pay

## 2024-02-24 ENCOUNTER — Other Ambulatory Visit: Payer: Self-pay

## 2024-03-02 ENCOUNTER — Ambulatory Visit

## 2024-03-16 ENCOUNTER — Other Ambulatory Visit: Payer: Self-pay

## 2024-03-21 ENCOUNTER — Encounter: Payer: Self-pay | Admitting: Family Medicine

## 2024-03-21 ENCOUNTER — Ambulatory Visit: Payer: Self-pay | Attending: Family Medicine | Admitting: Family Medicine

## 2024-03-21 ENCOUNTER — Other Ambulatory Visit (HOSPITAL_COMMUNITY)
Admission: RE | Admit: 2024-03-21 | Discharge: 2024-03-21 | Disposition: A | Discharge status: Home / Self Care | Payer: Self-pay | Source: Ambulatory Visit | Attending: Family Medicine | Admitting: Family Medicine

## 2024-03-21 VITALS — BP 109/71 | HR 73 | Temp 97.9°F | Ht 59.0 in | Wt 115.2 lb

## 2024-03-21 DIAGNOSIS — Z8541 Personal history of malignant neoplasm of cervix uteri: Secondary | ICD-10-CM

## 2024-03-21 NOTE — Patient Instructions (Signed)
 VISIT SUMMARY:  Today, you came in for a vaginal Pap smear due to your history of cervical cancer and cervix removal. We also discussed your abdominal pain and constipation, and the need for a CT scan and colonoscopy.  YOUR PLAN:  -CONSTIPATION WITH ABDOMINAL PAIN AND ABDOMINAL DISTENSION: Constipation can cause abdominal pain and swelling. We previously recommended an abdominal CT scan to investigate these symptoms. We will check if the CT scan was scheduled and provide you with the information.  -GENERAL GYNECOLOGIC HEALTH MAINTENANCE (VAGINAL PAP SMEAR): A vaginal Pap smear was performed to screen for any cancerous cells in the vaginal area, given your history of cervical cancer. We will call you with the results.  -PERSONAL HISTORY OF CERVICAL CANCER: Given your history of cancer, we will inform the pathology team to ensure a thorough examination of your Pap smear results.  -GENERAL HEALTH MAINTENANCE: You need a colonoscopy for routine screening. The referral was made, but it was delayed due to financial aid issues. We will ensure the referral is in place and contact you to schedule the procedure.  INSTRUCTIONS:  We will check if your abdominal CT scan was scheduled and provide you with the information. We will also contact you to schedule your colonoscopy once the financial aid issues are resolved. Expect a phone call with your Pap smear results.

## 2024-03-21 NOTE — Progress Notes (Signed)
 Subjective:  Patient ID: Crystal [name], female    DOB: 03-23-1971  Age: 53 y.o. MRN: [id number]  CC: Gynecologic Exam     Discussed the use of AI scribe software for clinical note transcription with the patient, who gave verbal consent to proceed.  History of Present Illness Crystal [name] is a 53 year old female with a history of GERD, Stage 1B cervical cancer status post robotic-assisted type III radical laparoscopic hysterectomy with bilateral salpingectomy and bilateral sentinel lymph node biopsy in 06/2015 (no residual carcinoma as per GYN ) who presents for a vaginal Pap smear.  She is unsure if a CT scan was scheduled following a previous discussion about it during her last visit for abdominal pain and constipation. She thought it was supposed to be today, but it appears it was not scheduled. The nurse will check on this.  She is also awaiting assistance in scheduling a colonoscopy, which was delayed due to financial aid issues now in process. A referral was made a month ago, and she is waiting for a call to schedule the procedure.    Past Medical History:  Diagnosis Date   Allergy    Cancer (HCC)    cervical cancer dx.    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   not cancer per pt   PONV (postoperative nausea and vomiting)     Past Surgical History:  Procedure Laterality Date   ABDOMINAL HYSTERECTOMY     BREAST BIOPSY Left 08/28/2015   MRI- High Risk   BREAST BIOPSY Right 08/28/2015   MRI- Benign   BREAST EXCISIONAL BIOPSY Left 10/03/2015   BREAST LUMPECTOMY WITH RADIOACTIVE SEED LOCALIZATION Left 10/03/2015   Procedure: LEFT BREAST LUMPECTOMY WITH RADIOACTIVE SEED LOCALIZATION;  Surgeon: Elon Pacini, MD;  Location: [facility];  Service: General;  Laterality: Left;   CERVICAL BIOPSY  W/ LOOP ELECTRODE  EXCISION     12'16   CESAREAN SECTION     2 previous   ROBOTIC ASSISTED TOTAL HYSTERECTOMY Bilateral 07/01/2015   Procedure: XI ROBOTIC ASSISTED RADICAL TYPE II HYSTERECTOMY, BILATERAL SALPINGECTOMY, SENTINAL LYMPH NODE BIOPSY;  Surgeon: Maurilio Ship, MD;  Location: [facility];  Service: Gynecology;  Laterality: Bilateral;    Family History  Problem Relation Age of Onset   Hypertension Sister    Atrial fibrillation Sister    Colon cancer Neg Hx    Esophageal cancer Neg Hx    Rectal cancer Neg Hx    Stomach cancer Neg Hx     Social History   Socioeconomic History   Marital status: Widowed    Spouse name: Not on file   Number of children: 2   Years of education: Not on file   Highest education level: 9th grade  Occupational History   Occupation: cleans homes  Tobacco Use   Smoking status: Never   Smokeless tobacco: Never  Vaping Use   Vaping status: Never Used  Substance and Sexual Activity   Alcohol use: Yes    Comment: occasionally   Drug use: No   Sexual activity: Not Currently    Birth control/protection: Surgical  Other Topics Concern   Not on file  Social History Narrative   Not on file   Social Drivers of Health   Financial Resource Strain: Not on file  Food Insecurity:  No Food Insecurity (09/08/2023)   Hunger Vital Sign    Worried About Running Out of Food in the Last Year: Never true    Ran Out of Food in the Last Year: Never true  Transportation Needs: No Transportation Needs (09/08/2023)   PRAPARE - Administrator, Civil Service (Medical): No    Lack of Transportation (Non-Medical): No  Physical Activity: Not on file  Stress: Not on file  Social Connections: Not on file    Allergies  Allergen Reactions   Contrast Media [Iodinated Contrast Media] Itching    13 hr prep in the future; itching of tongue and around the mouth 03/02/2019    Outpatient Medications Prior to Visit  Medication Sig Dispense Refill   diazepam  (VALIUM ) 5 MG tablet Take 1  tablet (5 mg total) by mouth every 12 (twelve) hours as needed for anxiety. (Patient not taking: Reported on 02/20/2024) 5 tablet 0   hydrOXYzine  (ATARAX ) 25 MG tablet Take 1 tablet (25 mg total) by mouth 3 (three) times daily as needed for anxiety. 20 tablet 0   linaclotide  (LINZESS ) 290 MCG CAPS capsule Take 1 capsule (290 mcg total) by mouth daily before breakfast. 90 capsule 1   methocarbamol  (ROBAXIN ) 500 MG tablet Take 2 tablets (1,000 mg total) by mouth every 8 (eight) hours as needed. (Patient not taking: Reported on 02/20/2024) 90 tablet 0   mirtazapine  (REMERON  SOL-TAB) 15 MG disintegrating tablet Take 1 tablet (15 mg total) by mouth at bedtime for 15 days. (Patient not taking: Reported on 02/20/2024) 15 tablet 0   naproxen  (NAPROSYN ) 500 MG tablet Take 1 tablet (500 mg total) by mouth 2 (two) times daily with a meal. 60 tablet 1   sodium chloride  (OCEAN) 0.65 % SOLN nasal spray Place 1 spray into both nostrils as needed for congestion. (Patient not taking: Reported on 02/20/2024) 30 mL 0   No facility-administered medications prior to visit.     ROS Review of Systems  Constitutional:  Negative for activity change and appetite change.  HENT:  Negative for sinus pressure and sore throat.   Respiratory:  Negative for chest tightness, shortness of breath and wheezing.   Cardiovascular:  Negative for chest pain and palpitations.  Gastrointestinal:  Positive for constipation. Negative for abdominal distention and abdominal pain.  Genitourinary: Negative.   Musculoskeletal: Negative.   Psychiatric/Behavioral:  Negative for behavioral problems and dysphoric mood.     Objective:  BP 109/71   Pulse 73   Temp 97.9 F (36.6 C) (Oral)   Ht 4' 11 (1.499 m)   Wt 115 lb 3.2 oz (52.3 kg)   LMP 07/01/2015   SpO2 98%   BMI 23.27 kg/m      03/21/2024    3:15 PM 02/20/2024    4:17 PM 12/27/2023    2:24 PM  BP/Weight  Systolic BP 109 124 103  Diastolic BP 71 75 60  Wt. (Lbs) 115.2 114.8    BMI 23.27 kg/m2 23.19 kg/m2       Physical Exam Constitutional:      Appearance: She is well-developed.  Cardiovascular:     Rate and Rhythm: Normal rate.     Heart sounds: Normal heart sounds. No murmur heard. Pulmonary:     Effort: Pulmonary effort is normal.     Breath sounds: Normal breath sounds. No wheezing or rales.  Chest:     Chest wall: No tenderness.  Abdominal:     General: Bowel sounds are normal. There is no  distension.     Palpations: Abdomen is soft. There is no mass.     Tenderness: There is no abdominal tenderness.  Genitourinary:    Vagina: Normal.     Uterus: Absent.   Musculoskeletal:        General: Normal range of motion.     Right lower leg: No edema.     Left lower leg: No edema.  Neurological:     Mental Status: She is alert and oriented to person, place, and time.  Psychiatric:        Mood and Affect: Mood normal.        Latest Ref Rng & Units 12/10/2023   11:10 AM 05/20/2023    1:56 PM 12/28/2022    8:51 AM  CMP  Glucose 70 - 99 mg/dL 882  894  896   BUN 6 - 20 mg/dL 14  16  17    Creatinine 0.44 - 1.00 mg/dL 9.07  9.27  9.21   Sodium 135 - 145 mmol/L 140  135  139   Potassium 3.5 - 5.1 mmol/L 3.7  4.5  4.4   Chloride 98 - 111 mmol/L 105  105  104   CO2 22 - 32 mmol/L 18  21  19    Calcium 8.9 - 10.3 mg/dL 9.8  9.2  9.6   Total Protein 6.5 - 8.1 g/dL 8.2   7.8   Total Bilirubin 0.0 - 1.2 mg/dL 1.1   0.4   Alkaline Phos 38 - 126 U/L 87   119   AST 15 - 41 U/L 23   20   ALT 0 - 44 U/L 20   26     Lipid Panel     Component Value Date/Time   CHOL 229 (H) 12/28/2022 0851   TRIG 114 12/28/2022 0851   HDL 62 12/28/2022 0851   CHOLHDL 3.2 11/03/2015 1017   VLDL 19 11/03/2015 1017   LDLCALC 147 (H) 12/28/2022 0851    CBC    Component Value Date/Time   WBC 5.8 12/10/2023 1110   RBC 4.75 12/10/2023 1110   HGB 14.9 12/10/2023 1110   HGB 15.4 12/28/2022 0851   HGB 15.0 07/08/2015 1441   HCT 42.8 12/10/2023 1110   HCT 45.5  12/28/2022 0851   HCT 45.5 07/08/2015 1441   PLT 281 12/10/2023 1110   PLT 264 12/28/2022 0851   MCV 90.1 12/10/2023 1110   MCV 95 12/28/2022 0851   MCV 96.1 07/08/2015 1441   MCH 31.4 12/10/2023 1110   MCHC 34.8 12/10/2023 1110   RDW 12.0 12/10/2023 1110   RDW 11.8 12/28/2022 0851   RDW 12.4 07/08/2015 1441   LYMPHSABS 2.0 05/20/2023 1356   LYMPHSABS 1.6 12/28/2022 0851   LYMPHSABS 1.1 07/08/2015 1441   MONOABS 0.5 05/20/2023 1356   MONOABS 0.6 07/08/2015 1441   EOSABS 0.1 05/20/2023 1356   EOSABS 0.1 12/28/2022 0851   BASOSABS 0.0 05/20/2023 1356   BASOSABS 0.0 12/28/2022 0851   BASOSABS 0.1 07/08/2015 1441    Lab Results  Component Value Date   HGBA1C 5.8 (H) 12/28/2022       Assessment & Plan  Personal history of cervical cancer Vaginal Pap smear performed due to history of cervical cancer and absence of cervix. Screening to ensure no presence of cancerous cells in the vaginal area.  General Health Maintenance Requires colonoscopy for routine screening. Referral was made previously but delayed due to financial aid issues. - Ensure referral for colonoscopy is in place. -  Contact her for colonoscopy scheduling. - Nurse to provide information regarding CT scan scheduling.      No orders of the defined types were placed in this encounter.   Follow-up: No follow-ups on file.       Corrina Sabin, MD, FAAFP. [facility] [place], [place] [phone]   03/21/2024, 4:59 PM

## 2024-03-23 ENCOUNTER — Ambulatory Visit: Payer: Self-pay | Admitting: Family Medicine

## 2024-03-23 LAB — CYTOLOGY - PAP
Chlamydia: NEGATIVE
Comment: NEGATIVE
Comment: NEGATIVE
Comment: NEGATIVE
Comment: NORMAL
Diagnosis: NEGATIVE
High risk HPV: NEGATIVE
Neisseria Gonorrhea: NEGATIVE
Trichomonas: NEGATIVE

## 2024-03-27 ENCOUNTER — Ambulatory Visit (INDEPENDENT_AMBULATORY_CARE_PROVIDER_SITE_OTHER): Payer: Self-pay | Admitting: Plastic Surgery

## 2024-03-27 ENCOUNTER — Encounter: Payer: Self-pay | Admitting: Plastic Surgery

## 2024-03-27 DIAGNOSIS — Z719 Counseling, unspecified: Secondary | ICD-10-CM

## 2024-03-27 NOTE — Progress Notes (Signed)
 Preoperative Dx: Hyperpigmentation of face  Postoperative Dx:  same  Procedure: laser to face  Anesthesia: none  Description of Procedure:  Risks and complications were explained to the patient. Consent was confirmed and signed. Eye protection was placed. Time out was called and all information was confirmed to be correct. The area  area was prepped with alcohol and wiped dry. The heroic BBL laser was set at 590 nm and 5 J/cm2. The moxi was then done on the face at 10.  The face was lasered. The patient tolerated the procedure well and there were no complications. The patient is to follow up in 4 weeks.

## 2024-03-29 ENCOUNTER — Ambulatory Visit (HOSPITAL_COMMUNITY)
Admission: RE | Admit: 2024-03-29 | Discharge: 2024-03-29 | Disposition: A | Discharge status: Home / Self Care | Payer: Self-pay | Source: Ambulatory Visit | Attending: Family Medicine | Admitting: Family Medicine

## 2024-03-29 DIAGNOSIS — Z8541 Personal history of malignant neoplasm of cervix uteri: Secondary | ICD-10-CM | POA: Insufficient documentation

## 2024-04-03 ENCOUNTER — Ambulatory Visit: Payer: Self-pay | Admitting: Family Medicine

## 2024-04-03 ENCOUNTER — Ambulatory Visit: Payer: Self-pay | Admitting: *Deleted

## 2024-04-03 DIAGNOSIS — Z0100 Encounter for examination of eyes and vision without abnormal findings: Secondary | ICD-10-CM

## 2024-04-03 NOTE — Telephone Encounter (Signed)
  FYI Only or Action Required?: Action required by provider: request for appointment.  Patient was last seen in primary care on 03/21/2024 by Newlin, Enobong, MD.  Called Nurse Triage reporting Blurred Vision (Burning in both eye).  Symptoms began several months ago.  Interventions attempted: OTC medications: eye drops, OTC reading glasses.  Symptoms are: gradually worsening.  Triage Disposition: See PCP When Office is Open (Within 3 Days)  Patient/caregiver understands and will follow disposition?: Yes  No open appointment within disposition- note sent for review and scheduling- patient is aware she will get call back- she wants to be seen as soon as possible   Copied from CRM #[id number]. Topic: Clinical - Red Word Triage >> Apr 03, 2024 12:04 PM Wess RAMAN wrote: Red Word that prompted transfer to Nurse Triage: blurred vision, burning in both eyes  [facility] #[id number] Reason for Disposition  [1] Brief (now gone) blurred vision AND [2] unexplained  Answer Assessment - Initial Assessment Questions 1. DESCRIPTION: How has your vision changed? (e.g., complete vision loss, blurred vision, double vision, floaters, etc.)    Several months- Blurred vision- 1 1/2 weeks- burning in the eye 2. LOCATION: One or both eyes? If one, ask: Which eye?     Both eyes 3. SEVERITY: Can you see anything? If Yes, ask: What can you see? (e.g., fine print)     Patient can not read- purchased reading glasses- OTC 4. ONSET: When did this begin? Did it start suddenly or has this been gradual?     3 months- gradual 5. PATTERN: Does this come and go, or has it been constant since it started?     Comes and goes 6. PAIN: Is there any pain in your eye(s)?  (Scale 1-10; or mild, moderate, severe)     Burning, 3-8/10, feels pressure 7. CONTACTS-GLASSES: Do you wear contacts or glasses?     No- patient has not prescription glasses in a while- last eye doctor appointment- 2 years ago 8. CAUSE: What  do you think is causing this visual problem?     Patient has used OTC drops 9. OTHER SYMPTOMS: Do you have any other symptoms? (e.g., confusion, headache, arm or leg weakness, speech problems)     Half of face is hurting  Protocols used: Vision Loss or Change-A-AH

## 2024-04-03 NOTE — Addendum Note (Signed)
 Addended by: Necie Wilcoxson on: 04/03/2024 03:09 PM   Modules accepted: Orders

## 2024-04-23 ENCOUNTER — Other Ambulatory Visit: Payer: Self-pay

## 2024-05-01 ENCOUNTER — Ambulatory Visit: Payer: Self-pay | Admitting: Plastic Surgery

## 2024-05-01 ENCOUNTER — Telehealth: Payer: Self-pay | Admitting: Plastic Surgery

## 2024-05-01 DIAGNOSIS — Z719 Counseling, unspecified: Secondary | ICD-10-CM

## 2024-05-01 NOTE — Telephone Encounter (Signed)
 Patient was seen today for N/C per Dr TEODORA only full face, She was asked to come back in 3 to 4 weeks and I scheduled her with you on 05/25/24 at 11:15am, Will this be a no charge as well or will they pay? Patients daughter just asked us  to call her back and let her know at [phone], Patients daughter is [place]

## 2024-05-01 NOTE — Progress Notes (Signed)
 Preoperative Dx: Hyperpigmentation of face  Postoperative Dx:  same  Procedure: laser to face   Anesthesia: none  Description of Procedure:  Risks and complications were explained to the patient. Consent was confirmed and signed. Eye protection was placed. Time out was called and all information was confirmed to be correct. The area  area was prepped with alcohol and wiped dry. The Heroic laser was set at 590 and 560 nm at preset J/cm2. The face was lasered. The patient tolerated the procedure well and there were no complications. The patient is to follow up in 4 weeks.

## 2024-05-02 NOTE — Telephone Encounter (Signed)
 I called and lvm on number on DPR to make patient aware

## 2024-05-05 ENCOUNTER — Emergency Department (HOSPITAL_COMMUNITY)
Admission: EM | Admit: 2024-05-05 | Discharge: 2024-05-05 | Disposition: A | Discharge status: Home / Self Care | Attending: Emergency Medicine | Admitting: Emergency Medicine

## 2024-05-05 ENCOUNTER — Encounter (HOSPITAL_COMMUNITY): Payer: Self-pay

## 2024-05-05 ENCOUNTER — Other Ambulatory Visit: Payer: Self-pay

## 2024-05-05 DIAGNOSIS — R10A1 Flank pain, right side: Secondary | ICD-10-CM | POA: Insufficient documentation

## 2024-05-05 DIAGNOSIS — M545 Low back pain, unspecified: Secondary | ICD-10-CM | POA: Insufficient documentation

## 2024-05-05 DIAGNOSIS — R748 Abnormal levels of other serum enzymes: Secondary | ICD-10-CM | POA: Insufficient documentation

## 2024-05-05 DIAGNOSIS — M79651 Pain in right thigh: Secondary | ICD-10-CM | POA: Insufficient documentation

## 2024-05-05 LAB — CBC WITH DIFFERENTIAL/PLATELET
Abs Immature Granulocytes: 0.02 K/uL (ref 0.00–0.07)
Basophils Absolute: 0 K/uL (ref 0.0–0.1)
Basophils Relative: 0 %
Eosinophils Absolute: 0.1 K/uL (ref 0.0–0.5)
Eosinophils Relative: 2 %
HCT: 43.4 % (ref 36.0–46.0)
Hemoglobin: 15.1 g/dL — ABNORMAL HIGH (ref 12.0–15.0)
Immature Granulocytes: 0 %
Lymphocytes Relative: 24 %
Lymphs Abs: 2 K/uL (ref 0.7–4.0)
MCH: 32.1 pg (ref 26.0–34.0)
MCHC: 34.8 g/dL (ref 30.0–36.0)
MCV: 92.3 fL (ref 80.0–100.0)
Monocytes Absolute: 0.5 K/uL (ref 0.1–1.0)
Monocytes Relative: 6 %
Neutro Abs: 5.7 K/uL (ref 1.7–7.7)
Neutrophils Relative %: 68 %
Platelets: 241 K/uL (ref 150–400)
RBC: 4.7 MIL/uL (ref 3.87–5.11)
RDW: 11.9 % (ref 11.5–15.5)
WBC: 8.3 K/uL (ref 4.0–10.5)
nRBC: 0 % (ref 0.0–0.2)

## 2024-05-05 LAB — URINALYSIS, ROUTINE W REFLEX MICROSCOPIC
Bilirubin Urine: NEGATIVE
Glucose, UA: NEGATIVE mg/dL
Hgb urine dipstick: NEGATIVE
Ketones, ur: NEGATIVE mg/dL
Leukocytes,Ua: NEGATIVE
Nitrite: NEGATIVE
Protein, ur: NEGATIVE mg/dL
Specific Gravity, Urine: 1.013 (ref 1.005–1.030)
pH: 5 (ref 5.0–8.0)

## 2024-05-05 LAB — COMPREHENSIVE METABOLIC PANEL WITH GFR
ALT: 30 U/L (ref 0–44)
AST: 22 U/L (ref 15–41)
Albumin: 4 g/dL (ref 3.5–5.0)
Alkaline Phosphatase: 109 U/L (ref 38–126)
Anion gap: 11 (ref 5–15)
BUN: 19 mg/dL (ref 6–20)
CO2: 22 mmol/L (ref 22–32)
Calcium: 9.6 mg/dL (ref 8.9–10.3)
Chloride: 105 mmol/L (ref 98–111)
Creatinine, Ser: 0.68 mg/dL (ref 0.44–1.00)
GFR, Estimated: >60 mL/min (ref >60)
Glucose, Bld: 143 mg/dL — ABNORMAL HIGH (ref 70–99)
Potassium: 3.9 mmol/L (ref 3.5–5.1)
Sodium: 138 mmol/L (ref 135–145)
Total Bilirubin: 0.8 mg/dL (ref 0.0–1.2)
Total Protein: 7.8 g/dL (ref 6.5–8.1)

## 2024-05-05 LAB — LIPASE, BLOOD: Lipase: 56 U/L — ABNORMAL HIGH (ref 11–51)

## 2024-05-05 MED ORDER — LIDOCAINE 5 % EX PTCH
1.0000 | MEDICATED_PATCH | CUTANEOUS | 0 refills | Status: AC
  Filled 2024-05-05: qty 30, 30d supply, fill #0

## 2024-05-05 MED ORDER — ONDANSETRON 4 MG PO TBDP
4.0000 mg | ORAL_TABLET | Freq: Three times a day (TID) | ORAL | 0 refills | Status: AC | PRN
  Filled 2024-05-05: qty 20, 7d supply, fill #0

## 2024-05-05 MED ORDER — LORAZEPAM 1 MG PO TABS
1.0000 mg | ORAL_TABLET | Freq: Once | ORAL | Status: AC
  Administered 2024-05-05: 1 mg via ORAL
  Filled 2024-05-05: qty 1

## 2024-05-05 MED ORDER — LIDOCAINE 5 % EX PTCH
1.0000 | MEDICATED_PATCH | CUTANEOUS | Status: DC
  Administered 2024-05-05: 1 via TRANSDERMAL
  Filled 2024-05-05: qty 1

## 2024-05-05 MED ORDER — MORPHINE SULFATE (PF) 2 MG/ML IV SOLN: 4.0000 mg | Freq: Once | INTRAVENOUS | Status: DC

## 2024-05-05 MED ORDER — OXYCODONE-ACETAMINOPHEN 5-325 MG PO TABS
1.0000 | ORAL_TABLET | Freq: Once | ORAL | Status: AC
  Administered 2024-05-05: 1 via ORAL
  Filled 2024-05-05: qty 1

## 2024-05-05 MED ORDER — PREDNISONE 10 MG PO TABS
ORAL_TABLET | ORAL | 0 refills | Status: DC
  Filled 2024-05-05: qty 42, 12d supply, fill #0

## 2024-05-05 MED ORDER — ONDANSETRON HCL 4 MG/2ML IJ SOLN: 4.0000 mg | Freq: Once | INTRAMUSCULAR | Status: DC

## 2024-05-05 NOTE — ED Triage Notes (Signed)
 Pt here for lower back pain and nausea x 1 week.

## 2024-05-05 NOTE — ED Provider Notes (Signed)
 [place] EMERGENCY DEPARTMENT AT [facility] Provider Note   CSN: [id number] Arrival date & time: 05/05/24  1238     Patient presents with: Hip Pain and Leg Pain   Crystal [name] is a 53 y.o. female.    Hip Pain  Leg Pain   Pt complains of abdominal pain, right flank pain, and right thigh pain x 8 days.  She denies injury, heavy lifting, physical exertion, or falls.  She woke up earlier that morning with this pain and has gotten progressively worse since that time.  She has been taking over-the-counter Advil  with minimal relief.  She last took Advil  early this morning.  She has had kidney stones in the past and states this pain does not feel like that.  She denies nausea at this time.     Prior to Admission medications  Medication Sig Start Date End Date Taking? Authorizing Provider  lidocaine  (LIDODERM ) 5 % Place 1 patch onto the skin daily. Remove & Discard patch within 12 hours or as directed by MD 05/05/24  Yes Lesieli Bresee, Hamp RAMAN, PA  ondansetron  (ZOFRAN -ODT) 4 MG disintegrating tablet Take 1 tablet (4 mg total) by mouth every 8 (eight) hours as needed for nausea or vomiting. 05/05/24  Yes Elva Mauro S, PA  predniSONE  (STERAPRED UNI-PAK 21 TAB) 10 MG (21) TBPK tablet Take by mouth daily. Take 6 tabs by mouth daily  for 2 days, then 5 tabs for 2 days, then 4 tabs for 2 days, then 3 tabs for 2 days, 2 tabs for 2 days, then 1 tab by mouth daily for 2 days 05/05/24  Yes Delmore Sear S, PA  diazepam  (VALIUM ) 5 MG tablet Take 1 tablet (5 mg total) by mouth every 12 (twelve) hours as needed for anxiety. Patient not taking: Reported on 02/20/2024 12/10/23   Donnajean Lynwood DEL, PA-C  hydrOXYzine  (ATARAX ) 25 MG tablet Take 1 tablet (25 mg total) by mouth 3 (three) times daily as needed for anxiety. 12/12/23   Arloa Suzen RAMAN, NP  linaclotide  (LINZESS ) 290 MCG CAPS capsule Take 1 capsule (290 mcg total) by mouth daily before breakfast. 02/20/24   Newlin, Enobong, MD   methocarbamol  (ROBAXIN ) 500 MG tablet Take 2 tablets (1,000 mg total) by mouth every 8 (eight) hours as needed. Patient not taking: Reported on 02/20/2024 09/28/23   Danton Jon HERO, PA-C  mirtazapine  (REMERON  SOL-TAB) 15 MG disintegrating tablet Take 1 tablet (15 mg total) by mouth at bedtime for 15 days. Patient not taking: Reported on 02/20/2024 12/12/23 12/27/23  Arloa Suzen RAMAN, NP  naproxen  (NAPROSYN ) 500 MG tablet Take 1 tablet (500 mg total) by mouth 2 (two) times daily with a meal. 09/28/23   McClung, Jon HERO, PA-C  sodium chloride  (OCEAN) 0.65 % SOLN nasal spray Place 1 spray into both nostrils as needed for congestion. Patient not taking: Reported on 02/20/2024 05/20/23 06/19/23  Mannie Pac T, DO    Allergies: Contrast media [iodinated contrast media]    Review of Systems  Updated Vital Signs BP 127/61   Pulse 65   Temp 97.6 F (36.4 C) (Oral)   Resp 16   Ht 4' 11 (1.499 m)   Wt 51.7 kg   LMP 07/01/2015   SpO2 100%   BMI 23.03 kg/m   Physical Exam Vitals and nursing note reviewed.  Constitutional:      General: She is not in acute distress. HENT:     Head: Normocephalic and atraumatic.     Nose: Nose normal.  Mouth/Throat:     Mouth: Mucous membranes are moist.  Eyes:     General: No scleral icterus. Cardiovascular:     Rate and Rhythm: Normal rate and regular rhythm.     Pulses: Normal pulses.     Heart sounds: Normal heart sounds.  Pulmonary:     Effort: Pulmonary effort is normal. No respiratory distress.     Breath sounds: No wheezing.  Abdominal:     Palpations: Abdomen is soft.     Tenderness: There is no abdominal tenderness.     Comments: Multiple repeat abdominal exams were without any abdominal tenderness.   Musculoskeletal:     Cervical back: Normal range of motion.     Right lower leg: No edema.     Left lower leg: No edema.     Comments: Bilateral feet with DP PT pulses intact  There is palpable right para vertebral muscular  tenderness of the right side of the low back.  This reproduces the patient's pain.    Skin:    General: Skin is warm and dry.     Capillary Refill: Capillary refill takes less than 2 seconds.  Neurological:     Mental Status: She is alert. Mental status is at baseline.     Comments: Sensation normal in bilateral lower extremities  Psychiatric:        Mood and Affect: Mood normal.        Behavior: Behavior normal.     (all labs ordered are listed, but only abnormal results are displayed) Labs Reviewed  CBC WITH DIFFERENTIAL/PLATELET - Abnormal; Notable for the following components:      Result Value   Hemoglobin 15.1 (*)    All other components within normal limits  COMPREHENSIVE METABOLIC PANEL WITH GFR - Abnormal; Notable for the following components:   Glucose, Bld 143 (*)    All other components within normal limits  LIPASE, BLOOD - Abnormal; Notable for the following components:   Lipase 56 (*)    All other components within normal limits  URINALYSIS, ROUTINE W REFLEX MICROSCOPIC    EKG: None  Radiology: No results found.   Procedures   Medications Ordered in the ED  lidocaine  (LIDODERM ) 5 % 1 patch (1 patch Transdermal Patch Applied 05/05/24 1617)  oxyCODONE -acetaminophen  (PERCOCET/ROXICET) 5-325 MG per tablet 1 tablet (1 tablet Oral Given 05/05/24 1335)  LORazepam  (ATIVAN ) tablet 1 mg (1 mg Oral Given 05/05/24 1619)                                    Medical Decision Making Risk Prescription drug management.   Pt complains of abdominal pain, right flank pain, and right thigh pain x 8 days.  She denies injury, heavy lifting, physical exertion, or falls.  She woke up earlier that morning with this pain and has gotten progressively worse since that time.  She has been taking over-the-counter Advil  with minimal relief.  She last took Advil  early this morning.  She has had kidney stones in the past and states this pain does not feel like that.  She denies nausea at  this time.   Patient physical exam consistent with muscular injury lipase is marginally elevated at 56 within normal at 51 I have a low suspicion for pancreatitis no epigastric tenderness.  Urinalysis unremarkable CBC without leukocytosis or anemia, CMP unremarkable  She feels much improved after 1 Ativan  and Lidoderm  patch.  Will  discharge home with Lidoderm , prednisone , recommendations to take Tylenol  and ibuprofen .  Zofran  for occasional nausea which she is experience.  Since she did have some nausea I did have a concern that she may have an abdominal etiology of her back pain however no blood in urine to indicate nephrolithiasis and symptoms not specifically concerning for this.  She is tolerating p.o. no current nausea low suspicion for intra-abdominal pathology with no abdominal tenderness however I gave patient very strict return precautions in Spanish with the interpreter at bedside emphasizing the importance of strict return precautions to the emergency room.  Patient agreeable to plan.  Final diagnoses:  Acute right-sided low back pain without sciatica    ED Discharge Orders          Ordered    ondansetron  (ZOFRAN -ODT) 4 MG disintegrating tablet  Every 8 hours PRN        05/05/24 1536    lidocaine  (LIDODERM ) 5 %  Every 24 hours        05/05/24 1536    predniSONE  (STERAPRED UNI-PAK 21 TAB) 10 MG (21) TBPK tablet  Daily        05/05/24 1542               Neldon Hamp RAMAN, [place] 05/05/24 1630

## 2024-05-05 NOTE — ED Triage Notes (Addendum)
 Spanish interpreter used for triage:  Pt c.o right sided hip pain that radiates to her lower abd and down to her knee with nausea x 8 days. Denies trauma.

## 2024-05-05 NOTE — ED Provider Triage Note (Signed)
 Emergency Medicine Provider Triage Evaluation Note  Crystal [name] , a 53 y.o. female  was evaluated in triage.  Pt complains of abdominal pain, right flank pain, and right thigh pain x 8 days.  She denies injury, heavy lifting, physical exertion, or falls.  She woke up earlier that morning with this pain and has gotten progressively worse since that time.  She has been taking over-the-counter Advil  with minimal relief.  She last took Advil  early this morning.  She has had kidney stones in the past and states this pain does not feel like that.  She denies nausea at this time.  Review of Systems  Positive: Abdominal pain, right flank pain, right thigh pain, nausea Negative: Fevers, hematuria, dysuria, vomiting, dark-colored urine  Physical Exam  BP 107/63   Pulse 88   Temp 97.6 F (36.4 C) (Oral)   Resp 18   Ht 4' 11 (1.499 m)   Wt 51.7 kg   LMP 07/01/2015   SpO2 100%   BMI 23.03 kg/m  Gen:   Awake, no acute distress though she does appear uncomfortably seated Resp:  Normal effort  MSK:   Moves extremities without difficulty  Other:  Pain with palpation of umbilical region of abdomen.  Right-sided CVA tenderness.  Reproducible pain with palpation of right anterior thigh.  Skin is warm and dry without pallor or diaphoresis and no obvious rash.  Medical Decision Making  Medically screening exam initiated at 1:16 PM.  Appropriate orders placed.  Crystal [name] was informed that the remainder of the evaluation will be completed by another provider, this initial triage assessment does not replace that evaluation, and the importance of remaining in the ED until their evaluation is complete.   Rosina Almarie LABOR, PA-C 05/05/24 1322

## 2024-05-05 NOTE — Discharge Instructions (Addendum)
 I suspect you have a muscle injury and I would like you to use Lidoderm  patches as prescribed.  Warm compresses gentle massage, gentle stretching, Tylenol  and ibuprofen  as discussed below will be helpful.  I would like you to monitor your symptoms of abdominal pain if you develop any new or worsening symptoms such as fever or vomiting like you to return to ER for additional workup.  Use zofran  as needed as prescribed for nausea.  I would also like you to start taking miralax  one scoop daily in beverage of your choice (gatorade or water ).   I am also prescribing a short course of prednisone  to take if you have not had any improvement in your back pain (wait 5 days to see if you're improving).     Sospecho que tiene una lesin muscular y le recomiendo que use los parches de Lidoderm  segn lo indicado. Las compresas tibias, los masco corporation, los estiramientos [place], el Tylenol  y el ibuprofeno, como se explica a continuacin, le sern de [place]. Le pido que est atento a sus sntomas de dolor abdominal y, si presenta algn sntoma nuevo o un empeoramiento de los sntomas, como fiebre o vmitos, le recomiendo que regrese a la sala de emergencias para una evaluacin adicional.  Use Zofran  segn sea necesario, tal como se lo recetaron, para las nuseas. Tambin le recomiendo que comience a tomar Miralax , una cucharada al da, disuelta en la bebida de su preferencia (Gatorade o agua).  Tambin le receto un tratamiento corto con prednisona para que lo tome si no nota mejora en su dolor de espalda (espere 5 das para ver si mejora).

## 2024-05-07 ENCOUNTER — Other Ambulatory Visit: Payer: Self-pay

## 2024-05-25 ENCOUNTER — Ambulatory Visit (INDEPENDENT_AMBULATORY_CARE_PROVIDER_SITE_OTHER): Payer: Self-pay | Admitting: Plastic Surgery

## 2024-05-25 ENCOUNTER — Encounter: Payer: Self-pay | Admitting: Plastic Surgery

## 2024-05-25 DIAGNOSIS — Z719 Counseling, unspecified: Secondary | ICD-10-CM

## 2024-05-25 NOTE — Progress Notes (Signed)
 Preoperative Dx: Hyperpigmentation of face  Postoperative Dx:  same  Procedure: laser to face  Anesthesia: none  Description of Procedure:  Risks and complications were explained to the patient. Consent was confirmed and signed. Eye protection was placed. Time out was called and all information was confirmed to be correct. The area  area was prepped with alcohol and wiped dry. The heroic laser was set at 590 and 560 nm and preset J/cm2. The face was lasered. The patient tolerated the procedure well and there were no complications. The patient is to follow up in 4 weeks.

## 2024-06-13 ENCOUNTER — Other Ambulatory Visit: Payer: Self-pay

## 2024-06-13 ENCOUNTER — Ambulatory Visit (INDEPENDENT_AMBULATORY_CARE_PROVIDER_SITE_OTHER): Payer: Self-pay | Admitting: Gastroenterology

## 2024-06-13 ENCOUNTER — Encounter: Payer: Self-pay | Admitting: Gastroenterology

## 2024-06-13 VITALS — BP 118/70 | HR 80 | Ht <= 58 in | Wt 116.8 lb

## 2024-06-13 DIAGNOSIS — Z860101 Personal history of adenomatous and serrated colon polyps: Secondary | ICD-10-CM

## 2024-06-13 DIAGNOSIS — K5909 Other constipation: Secondary | ICD-10-CM

## 2024-06-13 DIAGNOSIS — K5900 Constipation, unspecified: Secondary | ICD-10-CM | POA: Insufficient documentation

## 2024-06-13 DIAGNOSIS — Z860100 Personal history of colon polyps, unspecified: Secondary | ICD-10-CM | POA: Insufficient documentation

## 2024-06-13 MED ORDER — NA SULFATE-K SULFATE-MG SULF 17.5-3.13-1.6 GM/177ML PO SOLN
1.0000 | Freq: Once | ORAL | 0 refills | Status: DC
  Filled 2024-06-13: qty 354, 1d supply, fill #0

## 2024-06-13 MED ORDER — NA SULFATE-K SULFATE-MG SULF 17.5-3.13-1.6 GM/177ML PO SOLN: 1.0000 | Freq: Once | ORAL | 0 refills | Status: AC

## 2024-06-13 NOTE — Patient Instructions (Addendum)
 Reiniciar el tratamiento con [name]  290 mcg al da.  Se le ha programado una colonoscopia. Por favor, siga las instrucciones escritas que se le entregaron durante su visita de hoy.  Si utiliza inhaladores (aunque solo sea ocasionalmente), trigalos consigo el da del procedimiento.  ____________________________________________________________  Crystal [name]  290 mcg daily.   You have been scheduled for a colonoscopy. Please follow written instructions given to you at your visit today.   If you use inhalers (even only as needed), please bring them with you on the day of your procedure.  DO NOT TAKE 7 DAYS PRIOR TO TEST- Trulicity (dulaglutide) Ozempic, Wegovy (semaglutide) Mounjaro, Zepbound (tirzepatide) Bydureon Bcise (exanatide extended release)  DO NOT TAKE 1 DAY PRIOR TO YOUR TEST Rybelsus (semaglutide) Adlyxin (lixisenatide) Victoza (liraglutide) Byetta (exanatide) ___________________________________________________________________________

## 2024-06-13 NOTE — Progress Notes (Signed)
 "    06/13/2024 Crystal [name] [id number] 06/09/1970   Discussed the use of AI scribe software for clinical note transcription with the patient, who gave verbal consent to proceed.  History of Present Illness Crystal [name] is a 54 year old female with chronic constipation who presents to schedule surveillance colonoscopy.  She is a patient of Dr. Trenna.  Chronic constipation was previously managed with Linzess  290 mcg daily, which she discontinued three months ago due to concerns about jitteriness possibly related to either Linzess  or prednisone . While on Linzess , bowel movements occurred three to four times daily; after discontinuation, frequency decreased to one to two times daily. She is concerned about this change in bowel habits.  Over the past three months, she has experienced intermittent abdominal pain, bloating described as like pregnancy, and occasional back pain. Pain is not present at the time of the visit and occurs intermittently. No blood has been observed in her stool. She had a satisfactory bowel movement earlier today.  Prednisone  was prescribed one month ago in the emergency room for hip and leg pain.   For the past two weeks, she has consumed homemade kefir prepared with whole milk three times daily. She reports feeling well with this regimen and has not noticed any adverse effects. She is concerned about potential weight gain from the whole milk that she is using to mix it but has not observed any problems.  CT scan of the abdomen and pelvis without contrast 03/2024: IMPRESSION: 1. No evidence of cervical cancer recurrence or metastatic disease. 2. Bilateral nephrolithiasis.   Colonoscopy 02/2018: - One 6 mm polyp in the descending colon, removed with a cold snare. Resected and retrieved. - Non- bleeding internal hemorrhoids.  Surgical [P], descending, polyp - TUBULAR ADENOMA (3 OF 3 FRAGMENTS) - NO HIGH GRADE DYSPLASIA OR  MALIGNANCY IDENTIFIED   Repeat recommended in 5 years.  Past Medical History:  Diagnosis Date   Allergy    Cancer (HCC)    cervical cancer dx.    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   not cancer per pt   PONV (postoperative nausea and vomiting)    Past Surgical History:  Procedure Laterality Date   ABDOMINAL HYSTERECTOMY     BREAST BIOPSY Left 08/28/2015   MRI- High Risk   BREAST BIOPSY Right 08/28/2015   MRI- Benign   BREAST EXCISIONAL BIOPSY Left 10/03/2015   BREAST LUMPECTOMY WITH RADIOACTIVE SEED LOCALIZATION Left 10/03/2015   Procedure: LEFT BREAST LUMPECTOMY WITH RADIOACTIVE SEED LOCALIZATION;  Surgeon: Elon Pacini, MD;  Location:  SURGERY CENTER;  Service: General;  Laterality: Left;   CERVICAL BIOPSY  W/ LOOP ELECTRODE EXCISION     12'16   CESAREAN SECTION     2 previous   ROBOTIC ASSISTED TOTAL HYSTERECTOMY Bilateral 07/01/2015   Procedure: XI ROBOTIC ASSISTED RADICAL TYPE II HYSTERECTOMY, BILATERAL SALPINGECTOMY, SENTINAL LYMPH NODE BIOPSY;  Surgeon: Maurilio Ship, MD;  Location: [facility];  Service: Gynecology;  Laterality: Bilateral;    reports that she has never smoked. She has never used smokeless tobacco. She reports current alcohol use. She reports that she does not use drugs. family history includes Atrial fibrillation in her sister; Hypertension in her sister. Allergies[1]    Outpatient Encounter Medications as of 06/13/2024  Medication Sig   hydrOXYzine  (ATARAX ) 25 MG  tablet Take 1 tablet (25 mg total) by mouth 3 (three) times daily as needed for anxiety.   lidocaine  (LIDODERM ) 5 % Place 1 patch onto the skin daily. Remove & Discard patch within 12 hours or as directed by MD   naproxen  (NAPROSYN ) 500 MG tablet Take 1 tablet (500 mg total) by mouth 2 (two) times daily with a meal.   ondansetron  (ZOFRAN -ODT) 4 MG disintegrating tablet  Take 1 tablet (4 mg total) by mouth every 8 (eight) hours as needed for nausea or vomiting.   diazepam  (VALIUM ) 5 MG tablet Take 1 tablet (5 mg total) by mouth every 12 (twelve) hours as needed for anxiety. (Patient not taking: Reported on 06/13/2024)   linaclotide  (LINZESS ) 290 MCG CAPS capsule Take 1 capsule (290 mcg total) by mouth daily before breakfast. (Patient not taking: Reported on 06/13/2024)   methocarbamol  (ROBAXIN ) 500 MG tablet Take 2 tablets (1,000 mg total) by mouth every 8 (eight) hours as needed. (Patient not taking: Reported on 06/13/2024)   [DISCONTINUED] mirtazapine  (REMERON  SOL-TAB) 15 MG disintegrating tablet Take 1 tablet (15 mg total) by mouth at bedtime for 15 days. (Patient not taking: Reported on 02/20/2024)   [DISCONTINUED] predniSONE  (DELTASONE ) 10 MG tablet Take 6 tabs by mouth daily  for 2 days, then 5 tabs for 2 days, then 4 tabs for 2 days, then 3 tabs for 2 days, 2 tabs for 2 days, then 1 tab by mouth daily for 2 days   [DISCONTINUED] sodium chloride  (OCEAN) 0.65 % SOLN nasal spray Place 1 spray into both nostrils as needed for congestion. (Patient not taking: Reported on 02/20/2024)   No facility-administered encounter medications on file as of 06/13/2024.    REVIEW OF SYSTEMS  : All other systems reviewed and negative except where noted in the History of Present Illness.   PHYSICAL EXAM: BP 118/70   Pulse 80   Ht 4' 10 (1.473 m)   Wt 116 lb 12.8 oz (53 kg)   LMP 07/01/2015   BMI 24.41 kg/m  General: Well developed female in no acute distress Head: Normocephalic and atraumatic Eyes:  Sclerae anicteric, conjunctiva pink. Ears: Normal auditory acuity Lungs: Clear throughout to auscultation; no W/R/R. Heart: Regular rate and rhythm; no M/R/G. Abdomen: Soft, non-distended.  BS present.  Mild diffuse TTP in lower abdomen. Rectal:  Will be done at the time of colonoscopy. Musculoskeletal: Symmetrical with no gross deformities  Skin: No lesions on visible  extremities Extremities: No edema  Neurological: Alert oriented x 4, grossly non-focal Psychological:  Alert and cooperative. Normal mood and affect  Assessment & Plan Chronic constipation Chronic constipation previously well-controlled, with recurrence of decreased bowel frequency and intermittent bloating after discontinuation of Linzess . Symptoms are currently mild and non-acute - Recommended restarting Linzess  for symptom control.  History of colon polyps She is overdue for routine colorectal cancer screening without current alarm symptoms, including hematochezia.  Last colonoscopy 2019 with TA and repeat was recommended in 5 years. - Scheduled colonoscopy for surveillance with Dr. Shila.   CC:  Newlin, Enobong, MD        [1]  Allergies Allergen Reactions   Contrast Media [Iodinated Contrast Media] Itching    13 hr prep in the future; itching of tongue and around the mouth 03/02/2019   "

## 2024-07-03 ENCOUNTER — Encounter: Admitting: Plastic Surgery

## 2024-07-13 ENCOUNTER — Encounter: Admitting: Gastroenterology

## 2024-08-10 ENCOUNTER — Other Ambulatory Visit: Admitting: Plastic Surgery
